# Patient Record
Sex: Female | Born: 1940 | Race: Black or African American | Hispanic: No | Smoking: Former smoker
Health system: Southern US, Community
[De-identification: ages and names within clinical notes are randomized; demographics above are authoritative.]

## PROBLEM LIST (undated history)

## (undated) DIAGNOSIS — Z950 Presence of cardiac pacemaker: Secondary | ICD-10-CM

## (undated) DIAGNOSIS — I739 Peripheral vascular disease, unspecified: Secondary | ICD-10-CM

## (undated) DIAGNOSIS — I4891 Unspecified atrial fibrillation: Secondary | ICD-10-CM

## (undated) DIAGNOSIS — I509 Heart failure, unspecified: Secondary | ICD-10-CM

## (undated) DIAGNOSIS — Z86718 Personal history of other venous thrombosis and embolism: Secondary | ICD-10-CM

## (undated) DIAGNOSIS — I10 Essential (primary) hypertension: Secondary | ICD-10-CM

## (undated) DIAGNOSIS — J449 Chronic obstructive pulmonary disease, unspecified: Secondary | ICD-10-CM

## (undated) DIAGNOSIS — E119 Type 2 diabetes mellitus without complications: Secondary | ICD-10-CM

## (undated) HISTORY — DX: Peripheral vascular disease, unspecified: I73.9

## (undated) HISTORY — DX: Presence of cardiac pacemaker: Z95.0

---

## 2001-09-05 ENCOUNTER — Ambulatory Visit (HOSPITAL_BASED_OUTPATIENT_CLINIC_OR_DEPARTMENT_OTHER): Admission: RE | Admit: 2001-09-05 | Discharge: 2001-09-05 | Payer: Self-pay | Admitting: Orthopedic Surgery

## 2001-10-31 ENCOUNTER — Ambulatory Visit (HOSPITAL_BASED_OUTPATIENT_CLINIC_OR_DEPARTMENT_OTHER): Admission: RE | Admit: 2001-10-31 | Discharge: 2001-10-31 | Payer: Self-pay | Admitting: Orthopedic Surgery

## 2002-01-02 ENCOUNTER — Ambulatory Visit (HOSPITAL_BASED_OUTPATIENT_CLINIC_OR_DEPARTMENT_OTHER): Admission: RE | Admit: 2002-01-02 | Discharge: 2002-01-02 | Payer: Self-pay | Admitting: Orthopedic Surgery

## 2013-04-05 ENCOUNTER — Emergency Department (HOSPITAL_BASED_OUTPATIENT_CLINIC_OR_DEPARTMENT_OTHER): Payer: Medicare Other

## 2013-04-05 ENCOUNTER — Emergency Department (HOSPITAL_BASED_OUTPATIENT_CLINIC_OR_DEPARTMENT_OTHER)
Admission: EM | Admit: 2013-04-05 | Discharge: 2013-04-05 | Disposition: A | Discharge status: Home / Self Care | Payer: Medicare Other | Attending: Emergency Medicine | Admitting: Emergency Medicine

## 2013-04-05 ENCOUNTER — Encounter (HOSPITAL_BASED_OUTPATIENT_CLINIC_OR_DEPARTMENT_OTHER): Payer: Self-pay | Admitting: Emergency Medicine

## 2013-04-05 DIAGNOSIS — I10 Essential (primary) hypertension: Secondary | ICD-10-CM | POA: Insufficient documentation

## 2013-04-05 DIAGNOSIS — Z7901 Long term (current) use of anticoagulants: Secondary | ICD-10-CM | POA: Insufficient documentation

## 2013-04-05 DIAGNOSIS — Z79899 Other long term (current) drug therapy: Secondary | ICD-10-CM | POA: Insufficient documentation

## 2013-04-05 DIAGNOSIS — R071 Chest pain on breathing: Secondary | ICD-10-CM | POA: Insufficient documentation

## 2013-04-05 DIAGNOSIS — Z7982 Long term (current) use of aspirin: Secondary | ICD-10-CM | POA: Insufficient documentation

## 2013-04-05 DIAGNOSIS — Z86718 Personal history of other venous thrombosis and embolism: Secondary | ICD-10-CM | POA: Insufficient documentation

## 2013-04-05 DIAGNOSIS — I4891 Unspecified atrial fibrillation: Secondary | ICD-10-CM | POA: Insufficient documentation

## 2013-04-05 DIAGNOSIS — Z87891 Personal history of nicotine dependence: Secondary | ICD-10-CM | POA: Insufficient documentation

## 2013-04-05 DIAGNOSIS — E119 Type 2 diabetes mellitus without complications: Secondary | ICD-10-CM | POA: Insufficient documentation

## 2013-04-05 DIAGNOSIS — J449 Chronic obstructive pulmonary disease, unspecified: Secondary | ICD-10-CM | POA: Insufficient documentation

## 2013-04-05 DIAGNOSIS — J4489 Other specified chronic obstructive pulmonary disease: Secondary | ICD-10-CM | POA: Insufficient documentation

## 2013-04-05 DIAGNOSIS — R0789 Other chest pain: Secondary | ICD-10-CM

## 2013-04-05 HISTORY — DX: Personal history of other venous thrombosis and embolism: Z86.718

## 2013-04-05 HISTORY — DX: Type 2 diabetes mellitus without complications: E11.9

## 2013-04-05 HISTORY — DX: Essential (primary) hypertension: I10

## 2013-04-05 HISTORY — DX: Chronic obstructive pulmonary disease, unspecified: J44.9

## 2013-04-05 LAB — URINE MICROSCOPIC-ADD ON

## 2013-04-05 LAB — URINALYSIS, ROUTINE W REFLEX MICROSCOPIC
Bilirubin Urine: NEGATIVE
Glucose, UA: >1000 mg/dL — AB
Ketones, ur: NEGATIVE mg/dL
Leukocytes, UA: NEGATIVE
pH: 5.5 (ref 5.0–8.0)

## 2013-04-05 LAB — PROTIME-INR
INR: 2.74 — ABNORMAL HIGH (ref 0.00–1.49)
Prothrombin Time: 28.1 seconds — ABNORMAL HIGH (ref 11.6–15.2)

## 2013-04-05 LAB — CBC WITH DIFFERENTIAL/PLATELET
Basophils Absolute: 0 10*3/uL (ref 0.0–0.1)
Basophils Relative: 0 % (ref 0–1)
Hemoglobin: 12.8 g/dL (ref 12.0–15.0)
MCHC: 33.1 g/dL (ref 30.0–36.0)
Monocytes Relative: 5 % (ref 3–12)
Neutro Abs: 7.9 10*3/uL — ABNORMAL HIGH (ref 1.7–7.7)
Neutrophils Relative %: 80 % — ABNORMAL HIGH (ref 43–77)
Platelets: 185 10*3/uL (ref 150–400)
RBC: 4.44 MIL/uL (ref 3.87–5.11)
RDW: 15.3 % (ref 11.5–15.5)

## 2013-04-05 LAB — TROPONIN I: Troponin I: <0.3 ng/mL (ref <0.30)

## 2013-04-05 LAB — BASIC METABOLIC PANEL
Chloride: 103 mEq/L (ref 96–112)
GFR calc Af Amer: 64 mL/min — ABNORMAL LOW (ref >90)
Potassium: 4 mEq/L (ref 3.5–5.1)
Sodium: 139 mEq/L (ref 135–145)

## 2013-04-05 MED ORDER — KETOROLAC TROMETHAMINE 30 MG/ML IJ SOLN
INTRAMUSCULAR | Status: AC
  Administered 2013-04-05: 30 mg
  Filled 2013-04-05: qty 1

## 2013-04-05 MED ORDER — POTASSIUM CHLORIDE CRYS ER 20 MEQ PO TBCR
EXTENDED_RELEASE_TABLET | ORAL | Status: AC
  Filled 2013-04-05: qty 2

## 2013-04-05 MED ORDER — TRAMADOL HCL 50 MG PO TABS: 50.0000 mg | ORAL_TABLET | Freq: Four times a day (QID) | ORAL | Status: DC | PRN

## 2013-04-05 MED ORDER — IOHEXOL 350 MG/ML SOLN
80.0000 mL | Freq: Once | INTRAVENOUS | Status: AC | PRN
  Administered 2013-04-05: 80 mL via INTRAVENOUS

## 2013-04-05 NOTE — ED Provider Notes (Signed)
CSN: [id number]     Arrival date & time 04/05/13  8295 History   First MD Initiated Contact with Patient 04/05/13 [phone]     Chief Complaint  Patient presents with  . Flank Pain   (Consider location/radiation/quality/duration/timing/severity/associated sxs/prior Treatment) Patient is a 72 y.o. female presenting with chest pain. The history is provided by the patient. No language interpreter was used.  Chest Pain Pain location:  R lateral chest Pain quality: sharp   Pain radiates to:  Does not radiate Pain severity:  Moderate Onset quality:  Sudden Duration:  12 hours Timing:  Constant Progression:  Unchanged Chronicity:  New Context: breathing   Relieved by:  Nothing Worsened by:  Nothing tried Ineffective treatments:  None tried Associated symptoms: no abdominal pain, no fever, no palpitations and no shortness of breath   Risk factors: prior DVT/PE   Risk factors: no aortic disease     Past Medical History  Diagnosis Date  . COPD (chronic obstructive pulmonary disease)   . Diabetes mellitus without complication   . Hypertension   . Hx of blood clots    History reviewed. No pertinent past surgical history. History reviewed. No pertinent family history. History  Substance Use Topics  . Smoking status: Former Games developer  . Smokeless tobacco: Not on file  . Alcohol Use: No   OB History   Grav Para Term Preterm Abortions TAB SAB Ect Mult Living                 Review of Systems  Constitutional: Negative for fever.  HENT: Negative for neck pain.   Respiratory: Negative for shortness of breath.   Cardiovascular: Positive for chest pain. Negative for palpitations and leg swelling.  Gastrointestinal: Negative for abdominal pain.  All other systems reviewed and are negative.    Allergies  Review of patient's allergies indicates no known allergies.  Home Medications   Current Outpatient Rx  Name  Route  Sig  Dispense  Refill  . albuterol (PROVENTIL) (2.5 MG/3ML)  0.083% nebulizer solution   Nebulization   Take 2.5 mg by nebulization every 6 (six) hours as needed for wheezing.         Marland Kitchen alendronate (FOSAMAX) 35 MG tablet   Oral   Take 35 mg by mouth every 7 (seven) days. Take with a full glass of water on an empty stomach.         Marland Kitchen aspirin 81 MG chewable tablet   Oral   Chew 81 mg by mouth daily.         Marland Kitchen glipizide-metformin (METAGLIP) 2.5-250 MG per tablet   Oral   Take 1 tablet by mouth 2 (two) times daily before a meal.         . metFORMIN (GLUCOPHAGE) 1000 MG tablet   Oral   Take 1,000 mg by mouth 2 (two) times daily with a meal.         . warfarin (COUMADIN) 6 MG tablet   Oral   Take 6 mg by mouth daily.          BP 149/110  Pulse 70  Temp(Src) 98.5 F (36.9 C) (Oral)  Resp 18  Ht 5\' 3"  (1.6 m)  Wt 193 lb (87.544 kg)  BMI 34.2 kg/m2  SpO2 98% Physical Exam  Constitutional: She is oriented to person, place, and time. She appears well-developed and well-nourished. No distress.  HENT:  Head: Normocephalic and atraumatic.  Mouth/Throat: Oropharynx is clear and moist.  Eyes: Conjunctivae are normal.  Pupils are equal, round, and reactive to light.  Neck: Normal range of motion. Neck supple.  Cardiovascular: Normal rate, regular rhythm and intact distal pulses.   Regular on exam  Pulmonary/Chest: Effort normal and breath sounds normal. She has no wheezes. She has no rales.  Abdominal: Soft. Bowel sounds are normal. There is no tenderness. There is no rebound and no guarding.  Musculoskeletal: Normal range of motion.  Neurological: She is alert and oriented to person, place, and time.  Skin: Skin is warm and dry.  Psychiatric: She has a normal mood and affect.    ED Course  Procedures (including critical care time) Labs Review Labs Reviewed  URINALYSIS, ROUTINE W REFLEX MICROSCOPIC - Abnormal; Notable for the following:    Glucose, UA >1000 (*)    Hgb urine dipstick SMALL (*)    All other components within  normal limits  URINE MICROSCOPIC-ADD ON - Abnormal; Notable for the following:    Squamous Epithelial / LPF FEW (*)    Bacteria, UA FEW (*)    All other components within normal limits  CBC WITH DIFFERENTIAL  BASIC METABOLIC PANEL  D-DIMER, QUANTITATIVE   Imaging Review No results found.  MDM  No diagnosis found. 04/05/2013    Date: 04/05/2013  Rate: 115  Rhythm: atrial fibrillation  QRS Axis: normal  Intervals: normal  ST/T Wave abnormalities: normal  Conduction Disutrbances:none  Narrative Interpretation:   Old EKG Reviewed: none available   Appropriately anti coagulated, In the setting of negative troponin and > 8 hours of pain ACS is excluded.  Will treat for chest wall strain.  Follow up with cardiology regarding PAF     Salinda Snedeker K Zilda No-Rasch, MD 04/05/13 [id number]

## 2013-04-05 NOTE — ED Notes (Signed)
Pt c/o pain on right and left flank that started last pm

## 2014-03-21 ENCOUNTER — Encounter (HOSPITAL_BASED_OUTPATIENT_CLINIC_OR_DEPARTMENT_OTHER): Payer: Self-pay | Admitting: Emergency Medicine

## 2014-03-21 ENCOUNTER — Emergency Department (HOSPITAL_BASED_OUTPATIENT_CLINIC_OR_DEPARTMENT_OTHER): Payer: Medicare Other

## 2014-03-21 ENCOUNTER — Emergency Department (HOSPITAL_BASED_OUTPATIENT_CLINIC_OR_DEPARTMENT_OTHER)
Admission: EM | Admit: 2014-03-21 | Discharge: 2014-03-21 | Disposition: A | Discharge status: Home / Self Care | Payer: Medicare Other | Attending: Emergency Medicine | Admitting: Emergency Medicine

## 2014-03-21 DIAGNOSIS — I10 Essential (primary) hypertension: Secondary | ICD-10-CM | POA: Diagnosis not present

## 2014-03-21 DIAGNOSIS — M7989 Other specified soft tissue disorders: Secondary | ICD-10-CM | POA: Insufficient documentation

## 2014-03-21 DIAGNOSIS — Z7982 Long term (current) use of aspirin: Secondary | ICD-10-CM | POA: Diagnosis not present

## 2014-03-21 DIAGNOSIS — Z7901 Long term (current) use of anticoagulants: Secondary | ICD-10-CM | POA: Diagnosis not present

## 2014-03-21 DIAGNOSIS — E119 Type 2 diabetes mellitus without complications: Secondary | ICD-10-CM | POA: Insufficient documentation

## 2014-03-21 DIAGNOSIS — Z86718 Personal history of other venous thrombosis and embolism: Secondary | ICD-10-CM | POA: Insufficient documentation

## 2014-03-21 DIAGNOSIS — Z79899 Other long term (current) drug therapy: Secondary | ICD-10-CM | POA: Insufficient documentation

## 2014-03-21 DIAGNOSIS — R079 Chest pain, unspecified: Secondary | ICD-10-CM | POA: Diagnosis not present

## 2014-03-21 DIAGNOSIS — I509 Heart failure, unspecified: Secondary | ICD-10-CM | POA: Insufficient documentation

## 2014-03-21 DIAGNOSIS — J441 Chronic obstructive pulmonary disease with (acute) exacerbation: Secondary | ICD-10-CM | POA: Diagnosis not present

## 2014-03-21 DIAGNOSIS — IMO0002 Reserved for concepts with insufficient information to code with codable children: Secondary | ICD-10-CM | POA: Diagnosis not present

## 2014-03-21 DIAGNOSIS — I499 Cardiac arrhythmia, unspecified: Secondary | ICD-10-CM | POA: Insufficient documentation

## 2014-03-21 DIAGNOSIS — Z87891 Personal history of nicotine dependence: Secondary | ICD-10-CM | POA: Diagnosis not present

## 2014-03-21 DIAGNOSIS — R0602 Shortness of breath: Secondary | ICD-10-CM

## 2014-03-21 HISTORY — DX: Heart failure, unspecified: I50.9

## 2014-03-21 LAB — CBC WITH DIFFERENTIAL/PLATELET
Basophils Absolute: 0 10*3/uL (ref 0.0–0.1)
Basophils Relative: 0 % (ref 0–1)
EOS ABS: 0 10*3/uL (ref 0.0–0.7)
EOS PCT: 0 % (ref 0–5)
HEMATOCRIT: 38.5 % (ref 36.0–46.0)
Hemoglobin: 12.5 g/dL (ref 12.0–15.0)
Lymphocytes Relative: 13 % (ref 12–46)
Lymphs Abs: 1.2 10*3/uL (ref 0.7–4.0)
MCH: 27.8 pg (ref 26.0–34.0)
MCHC: 32.5 g/dL (ref 30.0–36.0)
MCV: 85.6 fL (ref 78.0–100.0)
MONO ABS: 0.4 10*3/uL (ref 0.1–1.0)
Monocytes Relative: 5 % (ref 3–12)
Neutro Abs: 7.1 10*3/uL (ref 1.7–7.7)
Neutrophils Relative %: 82 % — ABNORMAL HIGH (ref 43–77)
Platelets: 210 10*3/uL (ref 150–400)
RBC: 4.5 MIL/uL (ref 3.87–5.11)
RDW: 17.6 % — ABNORMAL HIGH (ref 11.5–15.5)
WBC: 8.7 10*3/uL (ref 4.0–10.5)

## 2014-03-21 LAB — COMPREHENSIVE METABOLIC PANEL
ALBUMIN: 3.1 g/dL — AB (ref 3.5–5.2)
ALT: 15 U/L (ref 0–35)
AST: 14 U/L (ref 0–37)
Alkaline Phosphatase: 78 U/L (ref 39–117)
Anion gap: 14 (ref 5–15)
BILIRUBIN TOTAL: 0.3 mg/dL (ref 0.3–1.2)
BUN: 17 mg/dL (ref 6–23)
CHLORIDE: 101 meq/L (ref 96–112)
CO2: 25 meq/L (ref 19–32)
Calcium: 9.9 mg/dL (ref 8.4–10.5)
Creatinine, Ser: 1 mg/dL (ref 0.50–1.10)
GFR calc Af Amer: 64 mL/min — ABNORMAL LOW (ref >90)
GFR calc non Af Amer: 55 mL/min — ABNORMAL LOW (ref >90)
Glucose, Bld: 264 mg/dL — ABNORMAL HIGH (ref 70–99)
Potassium: 4.8 mEq/L (ref 3.7–5.3)
Sodium: 140 mEq/L (ref 137–147)
Total Protein: 7 g/dL (ref 6.0–8.3)

## 2014-03-21 LAB — TROPONIN I

## 2014-03-21 LAB — PRO B NATRIURETIC PEPTIDE: PRO B NATRI PEPTIDE: 538.9 pg/mL — AB (ref 0–125)

## 2014-03-21 LAB — PROTIME-INR
INR: 3.08 — AB (ref 0.00–1.49)
Prothrombin Time: 31.8 seconds — ABNORMAL HIGH (ref 11.6–15.2)

## 2014-03-21 MED ORDER — SODIUM CHLORIDE 0.9 % IV SOLN: INTRAVENOUS | Status: DC

## 2014-03-21 NOTE — ED Notes (Signed)
Pt c/o SHOB and BLE edema since last pm; taking meds as directed

## 2014-03-21 NOTE — Discharge Instructions (Signed)
Return for any newer worse symptoms. Make plans to followup with your regular Dr. in the next several days. Would recommend using your albuterol inhaler 2 puffs every 6 hours for the next few days. Would recommend taking your when necessary dose of Lasix daily for the next few days. Workup here without evidence of distinct congestive heart failure or evidence of a blood clot in the left leg. Symptoms could be related to COPD and/or combination of that with some mild CHF. However chest x-ray was negative for any pulmonary edema or fluid on the lungs.

## 2014-03-21 NOTE — ED Provider Notes (Signed)
CSN: [id number]     Arrival date & time 03/21/14  1443 History   First MD Initiated Contact with Patient 03/21/14 1507    This chart was scribed for Vanetta Mulders, MD by Tonye Royalty, ED Scribe. This patient was seen in room MH03/MH03 and the patient's care was started at 3:16 PM.     Chief Complaint  Patient presents with  . Shortness of Breath   Patient is a 73 y.o. female presenting with shortness of breath. The history is provided by the patient and a relative. No language interpreter was used.  Shortness of Breath Severity:  Mild Onset quality:  Sudden Duration: since last night. Timing:  Constant Chronicity:  Recurrent Relieved by:  Nothing Exacerbated by: lying down. Ineffective treatments:  None tried Associated symptoms: chest pain and cough   Associated symptoms: no abdominal pain, no fever, no headaches, no rash, no sore throat and no vomiting   Chest pain:    Duration: since last night.   Timing:  Intermittent (<15 minutes per episode)   HPI Comments: Jill Crane is a 73 y.o. female who presents to the Emergency Department complaining of shortness of breath with onset last night, worse with laying down. She reports prior shortness of breath due to congestive heart failure and states she takes Lasix as needed. She also reports history of COPD and atrial fibrillation. She reports associated intermittent chest pain lasting less than 15 minutes each time with onset last night. She also reports associated swelling in legs, distended abdomen, and cough.  She denies abdominal pain.   Past Medical History  Diagnosis Date  . COPD (chronic obstructive pulmonary disease)   . Diabetes mellitus without complication   . Hypertension   . Hx of blood clots   . CHF (congestive heart failure)    History reviewed. No pertinent past surgical history. No family history on file. History  Substance Use Topics  . Smoking status: Former Games developer  . Smokeless tobacco: Not on file  .  Alcohol Use: No   OB History   Grav Para Term Preterm Abortions TAB SAB Ect Mult Living                 Review of Systems  Constitutional: Negative for fever and chills.  HENT: Negative for rhinorrhea and sore throat.   Eyes: Negative for visual disturbance.  Respiratory: Positive for cough and shortness of breath.   Cardiovascular: Positive for chest pain and leg swelling (unilateral, left).  Gastrointestinal: Positive for abdominal distention. Negative for nausea, vomiting, abdominal pain and diarrhea.  Genitourinary: Negative for dysuria.  Musculoskeletal: Negative for back pain.  Skin: Negative for rash.  Neurological: Negative for headaches.  Hematological: Bruises/bleeds easily.  Psychiatric/Behavioral: Negative for confusion.      Allergies  Review of patient's allergies indicates no known allergies.  Home Medications   Prior to Admission medications   Medication Sig Start Date End Date Taking? Authorizing Provider  albuterol (PROVENTIL) (2.5 MG/3ML) 0.083% nebulizer solution Take 2.5 mg by nebulization every 6 (six) hours as needed for wheezing.   Yes Historical Provider, MD  alendronate (FOSAMAX) 35 MG tablet Take 35 mg by mouth every 7 (seven) days. Take with a full glass of water on an empty stomach.   Yes Historical Provider, MD  aspirin 81 MG chewable tablet Chew 81 mg by mouth daily.   Yes Historical Provider, MD  calcium-vitamin D (OSCAL WITH D) 500-200 MG-UNIT per tablet Take 1 tablet by mouth.   Yes Historical  Provider, MD  FUROSEMIDE PO Take by mouth as needed.   Yes Historical Provider, MD  glipiZIDE (GLUCOTROL XL) 5 MG 24 hr tablet Take 5 mg by mouth daily with breakfast.   Yes Historical Provider, MD  LISINOPRIL PO Take 2.5 mg by mouth daily.   Yes Historical Provider, MD  metFORMIN (GLUCOPHAGE) 500 MG tablet Take by mouth 2 (two) times daily with a meal.   Yes Historical Provider, MD  metoprolol tartrate (LOPRESSOR) 25 MG tablet Take 25 mg by mouth 2 (two)  times daily.   Yes Historical Provider, MD  omeprazole (PRILOSEC) 20 MG capsule Take 20 mg by mouth daily.   Yes Historical Provider, MD  predniSONE (DELTASONE) 5 MG tablet Take 5 mg by mouth daily with breakfast.   Yes Historical Provider, MD  glipizide-metformin (METAGLIP) 2.5-250 MG per tablet Take 1 tablet by mouth 2 (two) times daily before a meal.    Historical Provider, MD  metFORMIN (GLUCOPHAGE) 1000 MG tablet Take 1,000 mg by mouth 2 (two) times daily with a meal.    Historical Provider, MD  traMADol (ULTRAM) 50 MG tablet Take 1 tablet (50 mg total) by mouth every 6 (six) hours as needed for pain. 04/05/13   April K Palumbo-Rasch, MD  warfarin (COUMADIN) 6 MG tablet Take 6 mg by mouth as directed.     Historical Provider, MD   BP 155/99  Pulse 52  Temp(Src) 97.5 F (36.4 C) (Oral)  Resp 20  Ht  (1.6 m)  Wt 185 lb (83.915 kg)  BMI 32.78 kg/m2  SpO2 97% Physical Exam  Nursing note and vitals reviewed. Constitutional: She is oriented to person, place, and time. She appears well-developed and well-nourished.  HENT:  Head: Normocephalic and atraumatic.  Moist mucous membranes  Eyes: Conjunctivae are normal. No scleral icterus.  Neck: Normal range of motion. Neck supple.  Cardiovascular:  irregular  Pulmonary/Chest: Effort normal and breath sounds normal. No respiratory distress. She has no wheezes. She has no rales.  Abdominal: Bowel sounds are normal. She exhibits distension. There is no tenderness.  Musculoskeletal: Normal range of motion. She exhibits edema (pitting, left leg only).  Dorsalis pedis pulse in both feet 1+, vein varicosity greater on left  Neurological: She is alert and oriented to person, place, and time. No cranial nerve deficit. She exhibits normal muscle tone. Coordination normal.  Skin: Skin is warm and dry.  Psychiatric: She has a normal mood and affect.    ED Course  Procedures (including critical care time) Labs Review Labs Reviewed   PROTIME-INR - Abnormal; Notable for the following:    Prothrombin Time 31.8 (*)    INR 3.08 (*)    All other components within normal limits  PRO B NATRIURETIC PEPTIDE - Abnormal; Notable for the following:    Pro B Natriuretic peptide (BNP) 538.9 (*)    All other components within normal limits  COMPREHENSIVE METABOLIC PANEL - Abnormal; Notable for the following:    Glucose, Bld 264 (*)    Albumin 3.1 (*)    GFR calc non Af Amer 55 (*)    GFR calc Af Amer 64 (*)    All other components within normal limits  CBC WITH DIFFERENTIAL - Abnormal; Notable for the following:    RDW 17.6 (*)    Neutrophils Relative % 82 (*)    All other components within normal limits  TROPONIN I  URINALYSIS, ROUTINE W REFLEX MICROSCOPIC    Imaging Review Dg Chest 2 View  03/21/2014   CLINICAL DATA:  Shortness of breath and left-sided chest pain since last night  EXAM: CHEST  2 VIEW  COMPARISON:  03/26/2013  FINDINGS: Moderate enlargement of the cardiac silhouette is noted without overt edema. The thoracic spine is poorly visualized due to subjective osteopenia, with mild mid thoracic kyphosis. No new focal pulmonary opacity. Patient is positioned in AP lordotic positioning. No pleural effusion.  IMPRESSION: Cardiomegaly without focal acute finding.   Electronically Signed   By: Christiana Pellant M.D.   On: 03/21/2014 16:07   US Venous Img Lower Unilateral Left  03/21/2014   CLINICAL DATA:  Left lower extremity swelling. The patient reports a previous left lower extremity deep venous thrombosis but prior imaging is not available at this institution.  EXAM: Left LOWER EXTREMITY VENOUS DOPPLER ULTRASOUND  TECHNIQUE: Gray-scale sonography with graded compression, as well as color Doppler and duplex ultrasound were performed to evaluate the lower extremity deep venous systems from the level of the common femoral vein and including the common femoral, femoral, profunda femoral, popliteal and calf veins including the  posterior tibial, peroneal and gastrocnemius veins when visible. The superficial great saphenous vein was also interrogated. Spectral Doppler was utilized to evaluate flow at rest and with distal augmentation maneuvers in the common femoral, femoral and popliteal veins.  COMPARISON:  None.  FINDINGS: Common Femoral Vein: No evidence of thrombus. Normal compressibility, respiratory phasicity and response to augmentation.  Saphenofemoral Junction: No evidence of thrombus. Normal compressibility and flow on color Doppler imaging. Linear peripheral echoes within the proximal superficial femoral vein have a configuration typical for remote thrombus and/or adjacent valve.  Profunda Femoral Vein: No evidence of thrombus. Normal compressibility and flow on color Doppler imaging.  Femoral Vein: No evidence of thrombus. Normal compressibility, respiratory phasicity and response to augmentation.  Popliteal Vein: No evidence of thrombus. Normal compressibility, respiratory phasicity and response to augmentation.  Calf Veins: No evidence of thrombus. Normal compressibility and flow on color Doppler imaging.  Superficial Great Saphenous Vein: No evidence of thrombus. Normal compressibility and flow on color Doppler imaging.  Venous Reflux:  None.  Other Findings:  None.  IMPRESSION: No evidence of acute deep venous thrombosis. Linear peripheral echoes within the proximal superficial femoral vein have a configuration typical for remote thrombus and/or adjacent valve.   Electronically Signed   By: Christiana Pellant M.D.   On: 03/21/2014 16:46     EKG Interpretation   Date/Time:  Saturday March 21 2014 15:17:55 EDT Ventricular Rate:  84 PR Interval:    QRS Duration: 90 QT Interval:  358 QTC Calculation: 423 R Axis:   -5 Text Interpretation:  atrial flutter with variable block Minimal voltage  criteria for LVH, may be normal variant Nonspecific T wave abnormality  Abnormal ECG No significant change since last tracing  Confirmed by  Bernece Gall  MD, Yacqub Baston [phone]) on 03/21/2014 3:38:17 PM     DIAGNOSTIC STUDIES: Oxygen Saturation is 97% on room air, normal by my interpretation.    COORDINATION OF CARE:    MDM   Final diagnoses:  Shortness of breath  Leg swelling   Patient with a history of COPD and CHF. No evidence of exacerbation of COPD here today with wheezing or any hypoxia. Patient very comfortable. Patient however may have some mild bronchospasm going on. In addition patient has a history of congestive heart failure. Chest x-ray negative for any significant pulmonary edema. Patient had the leg swelling predominantly left minimal swelling on the right Doppler  studies of the left leg showed no evidence of a deep vein thrombosis. Patient is on a when necessary dose of Lasix at home and also has albuterol inhaler. We'll have patient start using them on a regular basis for the next few days. And followup with her record Dr.  In addition no evidence of an acute cardiac event. Troponin was negative EKG without any acute changes. Patient has a long-standing history of atrial for relation she is on Coumadin for that INR is close to therapeutic at a level above 3. No reason at this time for adjustment. Also clinically do not feel that patient has any clinical evidence of a pulmonary embolus.    I personally performed the services described in this documentation, which was scribed in my presence. The recorded information has been reviewed and is accurate.      Vanetta Mulders, MD 03/21/14 1739

## 2014-03-21 NOTE — ED Notes (Signed)
Pt discharged to home with family. NAD.  

## 2014-08-16 ENCOUNTER — Encounter (HOSPITAL_BASED_OUTPATIENT_CLINIC_OR_DEPARTMENT_OTHER): Payer: Self-pay | Admitting: *Deleted

## 2014-08-16 ENCOUNTER — Emergency Department (HOSPITAL_BASED_OUTPATIENT_CLINIC_OR_DEPARTMENT_OTHER)
Admission: EM | Admit: 2014-08-16 | Discharge: 2014-08-16 | Disposition: A | Discharge status: Home / Self Care | Payer: Medicare Other | Attending: Emergency Medicine | Admitting: Emergency Medicine

## 2014-08-16 ENCOUNTER — Emergency Department (HOSPITAL_BASED_OUTPATIENT_CLINIC_OR_DEPARTMENT_OTHER): Payer: Medicare Other

## 2014-08-16 DIAGNOSIS — I4891 Unspecified atrial fibrillation: Secondary | ICD-10-CM | POA: Diagnosis not present

## 2014-08-16 DIAGNOSIS — Z87891 Personal history of nicotine dependence: Secondary | ICD-10-CM | POA: Insufficient documentation

## 2014-08-16 DIAGNOSIS — J441 Chronic obstructive pulmonary disease with (acute) exacerbation: Secondary | ICD-10-CM | POA: Insufficient documentation

## 2014-08-16 DIAGNOSIS — J029 Acute pharyngitis, unspecified: Secondary | ICD-10-CM | POA: Insufficient documentation

## 2014-08-16 DIAGNOSIS — E119 Type 2 diabetes mellitus without complications: Secondary | ICD-10-CM | POA: Diagnosis not present

## 2014-08-16 DIAGNOSIS — I10 Essential (primary) hypertension: Secondary | ICD-10-CM | POA: Insufficient documentation

## 2014-08-16 DIAGNOSIS — Z7982 Long term (current) use of aspirin: Secondary | ICD-10-CM | POA: Insufficient documentation

## 2014-08-16 DIAGNOSIS — Z79899 Other long term (current) drug therapy: Secondary | ICD-10-CM | POA: Insufficient documentation

## 2014-08-16 DIAGNOSIS — Z7952 Long term (current) use of systemic steroids: Secondary | ICD-10-CM | POA: Insufficient documentation

## 2014-08-16 DIAGNOSIS — R0602 Shortness of breath: Secondary | ICD-10-CM

## 2014-08-16 DIAGNOSIS — I509 Heart failure, unspecified: Secondary | ICD-10-CM | POA: Diagnosis not present

## 2014-08-16 LAB — COMPREHENSIVE METABOLIC PANEL
ALT: 10 U/L (ref 0–35)
AST: 19 U/L (ref 0–37)
Albumin: 3.6 g/dL (ref 3.5–5.2)
Alkaline Phosphatase: 82 U/L (ref 39–117)
Anion gap: 4 — ABNORMAL LOW (ref 5–15)
BUN: 13 mg/dL (ref 6–23)
CHLORIDE: 109 mmol/L (ref 96–112)
CO2: 21 mmol/L (ref 19–32)
Calcium: 8.8 mg/dL (ref 8.4–10.5)
Creatinine, Ser: 1.19 mg/dL — ABNORMAL HIGH (ref 0.50–1.10)
GFR calc Af Amer: 52 mL/min — ABNORMAL LOW (ref >90)
GFR, EST NON AFRICAN AMERICAN: 44 mL/min — AB (ref >90)
Glucose, Bld: 147 mg/dL — ABNORMAL HIGH (ref 70–99)
Potassium: 3.5 mmol/L (ref 3.5–5.1)
SODIUM: 134 mmol/L — AB (ref 135–145)
Total Bilirubin: 0.9 mg/dL (ref 0.3–1.2)
Total Protein: 7.1 g/dL (ref 6.0–8.3)

## 2014-08-16 LAB — CBC WITH DIFFERENTIAL/PLATELET
BASOS PCT: 1 % (ref 0–1)
Basophils Absolute: 0 10*3/uL (ref 0.0–0.1)
Eosinophils Absolute: 0 10*3/uL (ref 0.0–0.7)
Eosinophils Relative: 1 % (ref 0–5)
HCT: 41.3 % (ref 36.0–46.0)
HEMOGLOBIN: 13.5 g/dL (ref 12.0–15.0)
Lymphocytes Relative: 45 % (ref 12–46)
Lymphs Abs: 2.7 10*3/uL (ref 0.7–4.0)
MCH: 27.2 pg (ref 26.0–34.0)
MCHC: 32.7 g/dL (ref 30.0–36.0)
MCV: 83.3 fL (ref 78.0–100.0)
MONOS PCT: 14 % — AB (ref 3–12)
Monocytes Absolute: 0.8 10*3/uL (ref 0.1–1.0)
NEUTROS PCT: 39 % — AB (ref 43–77)
Neutro Abs: 2.3 10*3/uL (ref 1.7–7.7)
PLATELETS: 219 10*3/uL (ref 150–400)
RBC: 4.96 MIL/uL (ref 3.87–5.11)
RDW: 15.1 % (ref 11.5–15.5)
WBC: 5.8 10*3/uL (ref 4.0–10.5)

## 2014-08-16 LAB — URINALYSIS, ROUTINE W REFLEX MICROSCOPIC
BILIRUBIN URINE: NEGATIVE
Glucose, UA: NEGATIVE mg/dL
Hgb urine dipstick: NEGATIVE
KETONES UR: NEGATIVE mg/dL
NITRITE: NEGATIVE
Protein, ur: NEGATIVE mg/dL
SPECIFIC GRAVITY, URINE: 1.015 (ref 1.005–1.030)
UROBILINOGEN UA: 1 mg/dL (ref 0.0–1.0)
pH: 5.5 (ref 5.0–8.0)

## 2014-08-16 LAB — URINE MICROSCOPIC-ADD ON

## 2014-08-16 LAB — PROTIME-INR
INR: 2.04 — ABNORMAL HIGH (ref 0.00–1.49)
PROTHROMBIN TIME: 23 s — AB (ref 11.6–15.2)

## 2014-08-16 LAB — I-STAT CG4 LACTIC ACID, ED: Lactic Acid, Venous: 1.66 mmol/L (ref 0.5–2.0)

## 2014-08-16 LAB — TROPONIN I

## 2014-08-16 LAB — BRAIN NATRIURETIC PEPTIDE: B Natriuretic Peptide: 273.7 pg/mL — ABNORMAL HIGH (ref 0.0–100.0)

## 2014-08-16 MED ORDER — DILTIAZEM HCL 25 MG/5ML IV SOLN: 20.0000 mg | Freq: Once | INTRAVENOUS | Status: DC

## 2014-08-16 MED ORDER — BENZOCAINE (TOPICAL) 20 % EX AERO: 1.0000 "application " | INHALATION_SPRAY | Freq: Four times a day (QID) | CUTANEOUS | Status: DC | PRN

## 2014-08-16 MED ORDER — BENZOCAINE 20 % MT SOLN
Freq: Once | OROMUCOSAL | Status: AC
  Administered 2014-08-16: 2 via OROMUCOSAL

## 2014-08-16 NOTE — ED Notes (Signed)
Unable to give urine sample after 2nd attempt.

## 2014-08-16 NOTE — ED Provider Notes (Signed)
CSN: [id number]     Arrival date & time 08/16/14  0116 History   First MD Initiated Contact with Patient 08/16/14 0149     Chief Complaint  Patient presents with  . Sore Throat     (Consider location/radiation/quality/duration/timing/severity/associated sxs/prior Treatment) HPI This is a 74 year old female with a history of atrial fibrillation and congestive heart failure. She is here with a one-week history of sore throat. Pain is worse with swallowing. Pain is gotten worse despite treatment with an unspecified antibiotic and omeprazole for the past several days. She is also having "white foam" in her throat which she is coughing up. She complains of general weakness and achiness. She denies fever, chills, chest pain or nasal congestion. She does have shortness of breath but this is chronic and she is equivocal about whether this is acutely changed. She had 2 episodes of vomiting yesterday as well as diarrhea. She denies lightheadedness on standing.  Past Medical History  Diagnosis Date  . COPD (chronic obstructive pulmonary disease)   . Diabetes mellitus without complication   . Hypertension   . Hx of blood clots   . CHF (congestive heart failure)    History reviewed. No pertinent past surgical history. No family history on file. History  Substance Use Topics  . Smoking status: Former Games developermoker  . Smokeless tobacco: Not on file  . Alcohol Use: No   OB History    No data available     Review of Systems  All other systems reviewed and are negative.   Allergies  Review of patient's allergies indicates no known allergies.  Home Medications   Prior to Admission medications   Medication Sig Start Date End Date Taking? Authorizing Provider  albuterol (PROVENTIL HFA;VENTOLIN HFA) 108 (90 BASE) MCG/ACT inhaler Inhale 2 puffs into the lungs every 6 (six) hours as needed for wheezing or shortness of breath.   Yes Historical Provider, MD  busPIRone (BUSPAR) 5 MG tablet Take 5 mg by  mouth 3 (three) times daily.   Yes Historical Provider, MD  DILTIAZEM HCL PO Take 240 mg by mouth. Pt is not sure if she is still on this med. Does not have an updated med list   Yes Historical Provider, MD  ipratropium-albuterol (DUONEB) 0.5-2.5 (3) MG/3ML SOLN Take 3 mLs by nebulization.   Yes Historical Provider, MD  alendronate (FOSAMAX) 35 MG tablet Take 35 mg by mouth every 7 (seven) days. Take with a full glass of water on an empty stomach.    Historical Provider, MD  aspirin 81 MG chewable tablet Chew 81 mg by mouth daily.    Historical Provider, MD  calcium-vitamin D (OSCAL WITH D) 500-200 MG-UNIT per tablet Take 1 tablet by mouth.    Historical Provider, MD  FUROSEMIDE PO Take by mouth as needed.    Historical Provider, MD  glipiZIDE (GLUCOTROL XL) 5 MG 24 hr tablet Take 5 mg by mouth daily with breakfast. Pt does not have an updated med list. Not sure if she is still taking this.    Historical Provider, MD  LISINOPRIL PO Take 2.5 mg by mouth daily.    Historical Provider, MD  metFORMIN (GLUCOPHAGE) 500 MG tablet Take by mouth 2 (two) times daily with a meal.    Historical Provider, MD  metoprolol tartrate (LOPRESSOR) 25 MG tablet Take 25 mg by mouth 2 (two) times daily.    Historical Provider, MD  omeprazole (PRILOSEC) 20 MG capsule Take 20 mg by mouth daily.    Historical  Provider, MD  predniSONE (DELTASONE) 5 MG tablet Take 5 mg by mouth daily with breakfast.    Historical Provider, MD  traMADol (ULTRAM) 50 MG tablet Take 1 tablet (50 mg total) by mouth every 6 (six) hours as needed for pain. 04/05/13   April K Palumbo-Rasch, MD  warfarin (COUMADIN) 6 MG tablet Take 6 mg by mouth as directed.     Historical Provider, MD   BP 130/74 mmHg  Pulse 77  Temp(Src) 97.9 F (36.6 C) (Oral)  Resp 24  Ht  (1.6 m)  Wt 180 lb (81.647 kg)  BMI 31.89 kg/m2  SpO2 92%   Physical Exam  General: Well-developed, well-nourished female in no acute distress; appearance consistent with age of  record HENT: normocephalic; atraumatic; pharynx appears normal except for presence of white foam and throat Eyes: pupils equal, round and reactive to light; extraocular muscles intact Neck: supple Heart: Irregular rhythm; tachycardia Lungs: clear to auscultation bilaterally Abdomen: soft; nondistended; nontender; no masses or hepatosplenomegaly; bowel sounds present Extremities: No deformity; full range of motion; edema of lower legs, left greater than right Neurologic: Awake, alert and oriented; motor function intact in all extremities and symmetric; no facial droop Skin: Warm and dry Psychiatric: Flat affect    ED Course  Procedures (including critical care time)   MDM  EKG Interpretation:  Date & Time: 08/16/2014 1:34 AM  Rate: 103  Rhythm: atrial flutter with variable block  QRS Axis: normal  Intervals: normal  ST/T Wave abnormalities: nonspecific ST/T changes  Conduction Disutrbances:none  Narrative Interpretation: LVH  Old EKG Reviewed: No significant changes   Nursing notes and vitals signs, including pulse oximetry, reviewed.  Summary of this visit's results, reviewed by myself:  Labs:  Results for orders placed or performed during the hospital encounter of 08/16/14 (from the past 24 hour(s))  Comprehensive metabolic panel     Status: Abnormal   Collection Time: 08/16/14  1:45 AM  Result Value Ref Range   Sodium 134 (L) 135 - 145 mmol/L   Potassium 3.5 3.5 - 5.1 mmol/L   Chloride 109 96 - 112 mmol/L   CO2 21 19 - 32 mmol/L   Glucose, Bld 147 (H) 70 - 99 mg/dL   BUN 13 6 - 23 mg/dL   Creatinine, Ser 1.61 (H) 0.50 - 1.10 mg/dL   Calcium 8.8 8.4 - 09.6 mg/dL   Total Protein 7.1 6.0 - 8.3 g/dL   Albumin 3.6 3.5 - 5.2 g/dL   AST 19 0 - 37 U/L   ALT 10 0 - 35 U/L   Alkaline Phosphatase 82 39 - 117 U/L   Total Bilirubin 0.9 0.3 - 1.2 mg/dL   GFR calc non Af Amer 44 (L) >90 mL/min   GFR calc Af Amer 52 (L) >90 mL/min   Anion gap 4 (L) 5 - 15  CBC with  Differential     Status: Abnormal   Collection Time: 08/16/14  1:45 AM  Result Value Ref Range   WBC 5.8 4.0 - 10.5 K/uL   RBC 4.96 3.87 - 5.11 MIL/uL   Hemoglobin 13.5 12.0 - 15.0 g/dL   HCT 04.5 40.9 - 81.1 %   MCV 83.3 78.0 - 100.0 fL   MCH 27.2 26.0 - 34.0 pg   MCHC 32.7 30.0 - 36.0 g/dL   RDW 91.4 78.2 - 95.6 %   Platelets 219 150 - 400 K/uL   Neutrophils Relative % 39 (L) 43 - 77 %   Neutro Abs 2.3  1.7 - 7.7 K/uL   Lymphocytes Relative 45 12 - 46 %   Lymphs Abs 2.7 0.7 - 4.0 K/uL   Monocytes Relative 14 (H) 3 - 12 %   Monocytes Absolute 0.8 0.1 - 1.0 K/uL   Eosinophils Relative 1 0 - 5 %   Eosinophils Absolute 0.0 0.0 - 0.7 K/uL   Basophils Relative 1 0 - 1 %   Basophils Absolute 0.0 0.0 - 0.1 K/uL  Protime-INR     Status: Abnormal   Collection Time: 08/16/14  1:45 AM  Result Value Ref Range   Prothrombin Time 23.0 (H) 11.6 - 15.2 seconds   INR 2.04 (H) 0.00 - 1.49  Troponin I     Status: None   Collection Time: 08/16/14  1:45 AM  Result Value Ref Range   Troponin I <0.03 <0.031 ng/mL  Brain natriuretic peptide     Status: Abnormal   Collection Time: 08/16/14  1:47 AM  Result Value Ref Range   B Natriuretic Peptide 273.7 (H) 0.0 - 100.0 pg/mL  I-Stat CG4 Lactic Acid, ED     Status: None   Collection Time: 08/16/14  1:57 AM  Result Value Ref Range   Lactic Acid, Venous 1.66 0.5 - 2.0 mmol/L  Urinalysis, Routine w reflex microscopic     Status: Abnormal   Collection Time: 08/16/14  4:23 AM  Result Value Ref Range   Color, Urine YELLOW YELLOW   APPearance CLEAR CLEAR   Specific Gravity, Urine 1.015 1.005 - 1.030   pH 5.5 5.0 - 8.0   Glucose, UA NEGATIVE NEGATIVE mg/dL   Hgb urine dipstick NEGATIVE NEGATIVE   Bilirubin Urine NEGATIVE NEGATIVE   Ketones, ur NEGATIVE NEGATIVE mg/dL   Protein, ur NEGATIVE NEGATIVE mg/dL   Urobilinogen, UA 1.0 0.0 - 1.0 mg/dL   Nitrite NEGATIVE NEGATIVE   Leukocytes, UA SMALL (A) NEGATIVE  Urine microscopic-add on     Status: None    Collection Time: 08/16/14  4:23 AM  Result Value Ref Range   Squamous Epithelial / LPF RARE RARE   WBC, UA 0-2 <3 WBC/hpf   RBC / HPF 0-2 <3 RBC/hpf   Bacteria, UA RARE RARE   Urine-Other MUCOUS PRESENT     Imaging Studies: Dg Chest 2 View  08/16/2014   CLINICAL DATA:  Blood tinged sputum for the past week. Treated with antibiotics for a sore throat. Shortness of breath at times.  EXAM: CHEST  2 VIEW  COMPARISON:  03/21/2014  FINDINGS: Mild cardiac enlargement. No pulmonary vascular congestion or edema. No focal airspace disease or consolidation in the lungs. No blunting of costophrenic angles. No pneumothorax. Calcified and tortuous aorta. 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 Inferior vena caval filter.  IMPRESSION: Cardiac enlargement without vascular congestion or edema. No evidence of active pulmonary disease.   Electronically Signed   By: Burman Nieves M.D.   On: 08/16/2014 02:11   4:54 AM Patient has remained stable in the ED. Her diagnostic studies are reassuring. She does not appear to be in overt pulmonary edema does not appear dyspneic. Her oxygen saturation is normal. Her heart rate is primarily been in the 80s and 90s. She was advised to continue the omeprazole and antibiotic. We will treat symptomatically.   Hanley Seamen, MD 08/16/14 [phone]

## 2014-08-16 NOTE — ED Notes (Addendum)
Pt alert, NAD, calm, interactive, skin W&D, resps e/u, mild increased wob noted, pt with scattered inconsistent responses[facility] no edema noted. VSS. LS diminished and clear.

## 2014-08-16 NOTE — ED Notes (Addendum)
I met patient in waiting room where patient stated she was here for pain all over and sinus drainage, with sore throat. Staff notified me that patient was CHF patient. I urgently took patient by wheelchair to room, had her place gown on, then took vitals. I then took ECG and handed print out to Dr.Molpus. I then documented vitals and placed patient on monitor.

## 2014-08-16 NOTE — ED Notes (Signed)
No changes, given PO water to provoke urine sample.

## 2014-08-16 NOTE — ED Notes (Signed)
Back from xray, pt up to b/r, steady gait, alert, NAD, calm, resps e/u, mild increased wob with ambulation, daughter present, unable to give urine sample at this time, returned to monitor, VSS.

## 2014-08-16 NOTE — ED Notes (Signed)
Pt up to b/r with family. No changes. Steady gait.

## 2014-08-16 NOTE — ED Notes (Signed)
Pt states she has been treated with antibiotic for a sorethroat. Pt also c/o white sputum for past week that has been blood tinged. Denies any cp. States she has been feeling a little sob at times. Atrial fib on monitor 97 States she has a hx of the same. No distress at present.

## 2015-09-11 ENCOUNTER — Encounter (HOSPITAL_BASED_OUTPATIENT_CLINIC_OR_DEPARTMENT_OTHER): Payer: Self-pay

## 2015-09-11 ENCOUNTER — Emergency Department (HOSPITAL_BASED_OUTPATIENT_CLINIC_OR_DEPARTMENT_OTHER)
Admission: EM | Admit: 2015-09-11 | Discharge: 2015-09-11 | Disposition: A | Discharge status: Home / Self Care | Payer: Medicare Other | Attending: Emergency Medicine | Admitting: Emergency Medicine

## 2015-09-11 DIAGNOSIS — R600 Localized edema: Secondary | ICD-10-CM | POA: Insufficient documentation

## 2015-09-11 DIAGNOSIS — Y9289 Other specified places as the place of occurrence of the external cause: Secondary | ICD-10-CM | POA: Insufficient documentation

## 2015-09-11 DIAGNOSIS — Z87891 Personal history of nicotine dependence: Secondary | ICD-10-CM | POA: Diagnosis not present

## 2015-09-11 DIAGNOSIS — Z7982 Long term (current) use of aspirin: Secondary | ICD-10-CM | POA: Diagnosis not present

## 2015-09-11 DIAGNOSIS — Y998 Other external cause status: Secondary | ICD-10-CM | POA: Diagnosis not present

## 2015-09-11 DIAGNOSIS — Z79899 Other long term (current) drug therapy: Secondary | ICD-10-CM | POA: Insufficient documentation

## 2015-09-11 DIAGNOSIS — X58XXXA Exposure to other specified factors, initial encounter: Secondary | ICD-10-CM | POA: Insufficient documentation

## 2015-09-11 DIAGNOSIS — M6281 Muscle weakness (generalized): Secondary | ICD-10-CM | POA: Insufficient documentation

## 2015-09-11 DIAGNOSIS — Z86718 Personal history of other venous thrombosis and embolism: Secondary | ICD-10-CM | POA: Insufficient documentation

## 2015-09-11 DIAGNOSIS — Z7901 Long term (current) use of anticoagulants: Secondary | ICD-10-CM | POA: Diagnosis not present

## 2015-09-11 DIAGNOSIS — E119 Type 2 diabetes mellitus without complications: Secondary | ICD-10-CM | POA: Insufficient documentation

## 2015-09-11 DIAGNOSIS — Z7984 Long term (current) use of oral hypoglycemic drugs: Secondary | ICD-10-CM | POA: Diagnosis not present

## 2015-09-11 DIAGNOSIS — I509 Heart failure, unspecified: Secondary | ICD-10-CM | POA: Diagnosis not present

## 2015-09-11 DIAGNOSIS — I10 Essential (primary) hypertension: Secondary | ICD-10-CM | POA: Insufficient documentation

## 2015-09-11 DIAGNOSIS — Z7952 Long term (current) use of systemic steroids: Secondary | ICD-10-CM | POA: Diagnosis not present

## 2015-09-11 DIAGNOSIS — S00512A Abrasion of oral cavity, initial encounter: Secondary | ICD-10-CM | POA: Insufficient documentation

## 2015-09-11 DIAGNOSIS — R791 Abnormal coagulation profile: Secondary | ICD-10-CM

## 2015-09-11 DIAGNOSIS — R58 Hemorrhage, not elsewhere classified: Secondary | ICD-10-CM | POA: Diagnosis present

## 2015-09-11 DIAGNOSIS — Y9389 Activity, other specified: Secondary | ICD-10-CM | POA: Diagnosis not present

## 2015-09-11 DIAGNOSIS — J449 Chronic obstructive pulmonary disease, unspecified: Secondary | ICD-10-CM | POA: Diagnosis not present

## 2015-09-11 LAB — CBC WITH DIFFERENTIAL/PLATELET
Basophils Absolute: 0 10*3/uL (ref 0.0–0.1)
Basophils Relative: 0 %
EOS PCT: 0 %
Eosinophils Absolute: 0 10*3/uL (ref 0.0–0.7)
HEMATOCRIT: 41.2 % (ref 36.0–46.0)
Hemoglobin: 13.9 g/dL (ref 12.0–15.0)
LYMPHS ABS: 1.2 10*3/uL (ref 0.7–4.0)
LYMPHS PCT: 13 %
MCH: 28.7 pg (ref 26.0–34.0)
MCHC: 33.7 g/dL (ref 30.0–36.0)
MCV: 84.9 fL (ref 78.0–100.0)
Monocytes Absolute: 0.3 10*3/uL (ref 0.1–1.0)
Monocytes Relative: 4 %
NEUTROS ABS: 7.7 10*3/uL (ref 1.7–7.7)
Neutrophils Relative %: 83 %
PLATELETS: 173 10*3/uL (ref 150–400)
RBC: 4.85 MIL/uL (ref 3.87–5.11)
RDW: 15.3 % (ref 11.5–15.5)
WBC: 9.2 10*3/uL (ref 4.0–10.5)

## 2015-09-11 LAB — PROTIME-INR
INR: 6.38 — AB (ref 0.00–1.49)
PROTHROMBIN TIME: 54 s — AB (ref 11.6–15.2)

## 2015-09-11 LAB — BASIC METABOLIC PANEL
ANION GAP: 10 (ref 5–15)
BUN: 14 mg/dL (ref 6–20)
CHLORIDE: 104 mmol/L (ref 101–111)
CO2: 22 mmol/L (ref 22–32)
Calcium: 8.2 mg/dL — ABNORMAL LOW (ref 8.9–10.3)
Creatinine, Ser: 1.19 mg/dL — ABNORMAL HIGH (ref 0.44–1.00)
GFR calc Af Amer: 51 mL/min — ABNORMAL LOW (ref >60)
GFR calc non Af Amer: 44 mL/min — ABNORMAL LOW (ref >60)
GLUCOSE: 342 mg/dL — AB (ref 65–99)
POTASSIUM: 3.7 mmol/L (ref 3.5–5.1)
SODIUM: 136 mmol/L (ref 135–145)

## 2015-09-11 NOTE — ED Notes (Signed)
Pt states awoke this am with blood in mouth, oral cavity visually assessed by this RN, no bleeding, bruising noted at this time. Pt states no bleeding since episode this am. Takes coumadin secondary to having "a blood clot in my leg", attends a coumadin clinic checks q month per family statement

## 2015-09-11 NOTE — Discharge Instructions (Signed)
Your INR was elevated today at 6.38.  Do not take your coumadin for the next two days.  Contact your coumadin clinic on Monday for dosage adjustments.  Your blood sugar was elevated today, please have this rechecked by your family doctor.  Get rechecked immediately if you hit your head, develop any additional bleeding, or have new/worrisome symptoms.     Warfarin Coagulopathy Warfarin (Coumadin) coagulopathy refers to bleeding that may occur as a complication of the medicine warfarin. Warfarin is an oral blood thinner (anticoagulant). Warfarin is used for medical conditions where thinning of the blood is needed to prevent blood clots.  CAUSES Bleeding is the most common and most serious complication of warfarin. The amount of bleeding is related to the warfarin dose and length of treatment. In addition, bleeding complications can also occur due to:  Intentional or accidental warfarin overdose.  Underlying medical conditions.  Dietary changes.  Medicine, herbal, supplement, or alcohol interactions. SYMPTOMS Severe bleeding while on warfarin may occur from any tissue or organ. Symptoms of the blood being too thin may include:  Bleeding from the nose or gums.  Blood in bowel movements which may appear as bright red, dark, or black tarry stools.  Blood in the urine which may appear as pink, red, or brown urine.  Unusual bruising or bruising easily.  A cut that does not stop bleeding within 10 minutes.  Vomiting blood or continuous nausea for more than 1 day.  Coughing up blood.  Broken blood vessels in your eye (subconjunctival hemorrhage).  Abdominal or back pain with or without flank bruising.  Sudden, severe headache.  Sudden weakness or numbness of the face, arm, or leg, especially on one side of the body.  Sudden confusion.  Trouble speaking (aphasia) or understanding.  Sudden trouble seeing in one or both eyes.  Sudden trouble walking.  Dizziness.  Loss of balance  or coordination.  Vaginal bleeding.  Swelling or pain at an injection site.  Superficial fat tissue death (necrosis) which may cause skin scarring. This is more common in women and may first present as pain in the waist, thighs, or buttocks. HOME CARE INSTRUCTIONS  Always contact your health care provider of any concerns or signs of possible warfarin coagulopathy as soon as possible.  Take warfarin exactly as directed by your health care provider. It is recommended that you take your warfarin dose at the same time of the day. If you have been told to stop taking warfarin, do not resume taking warfarin until directed to do so by your health care provider. Follow your health care provider's instructions if you accidentally take an extra dose or miss a dose of warfarin. It is very important to take warfarin as directed since bleeding or blood clots could result in chronic or permanent injury, pain, or disability.  Keep all follow-up appointments with your health care provider as directed. It is very important to keep your appointments. Not keeping appointments could result in a chronic or permanent injury, pain, or disability because warfarin is a medicine that requires close monitoring.  While taking warfarin, you will need to have regular blood tests to measure your blood clotting time. These blood tests usually include both the prothrombin time (PT) and International Normalized Ratio (INR) tests. The PT and INR results allow your health care provider to adjust your dose of warfarin. The dose can change for many reasons. It is critically important that you have your PT and INR levels drawn exactly as directed. Your warfarin  dose may stay the same or change depending on what the PT and INR results are. Be sure to follow up with your health care provider regarding your PT and INR test results and what your warfarin dosage should be.  Many medicines can interfere with warfarin and affect the PT and INR  results. You must tell your health care provider about any and all medicines you take. This includes all vitamins and supplements. Ask your health care provider before taking these. Prescription and over-the-counter medicine consistency is critical to warfarin management. It is important that potential interactions are checked before you start a new medicine. Be especially cautious with aspirin and anti-inflammatory medicines. Ask your health care provider before taking these. Medicines such as antibiotics and acid-reducing medicine can interact with warfarin and can cause an increased warfarin effect. Warfarin can also interfere with the effectiveness of medicines you are taking. Do not take or discontinue any prescribed or over-the-counter medicine except on the advice of your health care provider or pharmacist.  Some vitamins, supplements, and herbal products interfere with the effectiveness of warfarin. Vitamin E may increase the anticoagulant effects of warfarin. Vitamin K can cause warfarin to be less effective. Do not take or discontinue any vitamin, supplement, or herbal product except on the advice of your health care provider or pharmacist.  Eat what you normally eat and keep the vitamin K content of your diet consistent. Avoid major changes in your diet, or notify your health care provider before changing your diet. Suddenly getting a lot more vitamin K could cause your blood to clot too quickly. A sudden decrease in vitamin K intake could cause your blood to clot too slowly. These changes in vitamin K intake could lead to dangerous blood clotsor to bleeding. To keep your vitamin K intake consistent, you must be aware of which foods contain moderate or high amounts of vitamin K. Some foods that are high in vitamin K include spinach, kale, broccoli, cabbage, greens, Brussels sprouts, asparagus, bok choy, coleslaw, and parsley. If you drink green tea, drink the same amount each day. Arrange a visit  with a dietitian to answer your questions.  If you have a loss of appetite or get the stomach flu (viral gastroenteritis), talk to your health care provider as soon as possible. A decrease in your normal vitamin K intake can make you more sensitive to your usual dose of warfarin.  Some medical conditions may increase your risk for bleeding while you are taking warfarin. A fever, diarrhea lasting more than a day, worsening heart failure, or worsening liver function are some medical conditions that could affect warfarin. Contact your health care provider if you have any of these medical conditions.  Be careful not to cut yourself when using sharp objects or while shaving.  Alcohol can change the body's ability to handle warfarin. It is best to avoid alcoholic drinks or consume only very small amounts while taking warfarin. Notify your health care provider if you change your alcohol intake. A sudden increase in alcohol use can increase your risk of bleeding. Chronic alcohol use can cause warfarin to be less effective.  Limit physical activities or sports that could result in a fall or cause injury.  Do not use warfarin if you are pregnant.  Inform all your health care providers and your dentist that you take warfarin.  Inform all health care providers if you are taking warfarin and aspirin or platelet inhibitor medicines such as clopidogrel, ticagrelor, or prasugrel. Use of  these medicines in addition to warfarin can increase your risk of bleeding or death. Taking these medicines together should only be done under the direct care of your health care providers. SEEK IMMEDIATE MEDICAL CARE IF:  You cough up blood.  You have dark or black stools or there is bright red blood coming from your rectum.  You vomit blood or have nausea for more than 1 day.  You have blood in the urine or pink-colored urine.  You have unusual bruising or have increased bruising.  You have bleeding from the nose or  gums that does not stop quickly.  You have a cut that does not stop bleeding within 2-3 minutes.  You have sudden weakness or numbness of the face, arm, or leg, especially on one side of the body.  You have sudden confusion.  You have trouble speaking (aphasia) or understanding.  You have sudden trouble seeing in one or both eyes.  You have sudden trouble walking.  You have dizziness.  You have a loss of balance or coordination.  You have a sudden, severe headache.  You have a serious fall or head injury, even if you are not bleeding.  You have swelling or pain at an injection site.  You have unexplained tenderness or pain in the abdomen, back, waist, thighs, or buttocks. Any of these symptoms may represent a serious problem that is an emergency. Do not wait to see if the symptoms will go away. Get medical help right away. Call your local emergency services (911 in [place] Do not drive yourself to the hospital.   This information is not intended to replace advice given to you by your health care provider. Make sure you discuss any questions you have with your health care provider.   Document Released: 06/11/2006 Document Revised: 11/17/2014 Document Reviewed: 12/12/2011 [employer] Interactive Patient Education [employer]

## 2015-09-11 NOTE — ED Provider Notes (Signed)
CSN: [id number]     Arrival date & time 09/11/15  1330 History  By signing my name below, I, Jill Crane, attest that this documentation has been prepared under the direction and in the presence of Tilden Fossa, MD. Electronically Signed: Bethel Crane, ED Scribe. 09/11/2015. 4:03 PM   Chief Complaint  Patient presents with  . mouth bleeding-on coumadin     The history is provided by the patient. No language interpreter was used.   Jill Crane is a 75 y.o. female with history of pemphigus, blood clots on coumadin, HTN, DM, COPD, and CHF who presents to the Emergency Department complaining of oral bleeding with onset this morning. Pt states that she woke up this morning with a small amount of blood on her pillow. She denies any oral pain. Pt wears dentures and notes that she has had them for "a good while".  Associated symptoms include generalized weakness. Pt denies any head injury, epistaxis, cough, chest pain, dysuria, and dark or bloody stool. She was [facility] on [address] last week and is currently taking 5 mg per day. Her granddaughter lives with her.   Past Medical History  Diagnosis Date  . COPD (chronic obstructive pulmonary disease) (HCC)   . Diabetes mellitus without complication (HCC)   . Hypertension   . Hx of blood clots   . CHF (congestive heart failure) (HCC)    History reviewed. No pertinent past surgical history. No family history on file. Social History  Substance Use Topics  . Smoking status: Former Games developer  . Smokeless tobacco: None  . Alcohol Use: No   OB History    No data available     Review of Systems  HENT: Negative for nosebleeds.        Oral bleeding  Respiratory: Negative for cough.   Cardiovascular: Negative for chest pain.  Gastrointestinal: Negative for blood in stool.  Genitourinary: Negative for dysuria.  Neurological: Positive for weakness (generalized).  All other systems reviewed and are negative.  Allergies   Review of patient's allergies indicates no known allergies.  Home Medications   Prior to Admission medications   Medication Sig Start Date End Date Taking? Authorizing Provider  albuterol (PROVENTIL HFA;VENTOLIN HFA) 108 (90 BASE) MCG/ACT inhaler Inhale 2 puffs into the lungs every 6 (six) hours as needed for wheezing or shortness of breath.    Historical Provider, MD  alendronate (FOSAMAX) 35 MG tablet Take 35 mg by mouth every 7 (seven) days. Take with a full glass of water on an empty stomach.    Historical Provider, MD  aspirin 81 MG chewable tablet Chew 81 mg by mouth daily.    Historical Provider, MD  busPIRone (BUSPAR) 5 MG tablet Take 5 mg by mouth 3 (three) times daily.    Historical Provider, MD  calcium-vitamin D (OSCAL WITH D) 500-200 MG-UNIT per tablet Take 1 tablet by mouth.    Historical Provider, MD  DILTIAZEM HCL PO Take 240 mg by mouth. Pt is not sure if she is still on this med. Does not have an updated med list    Historical Provider, MD  FUROSEMIDE PO Take by mouth as needed.    Historical Provider, MD  ipratropium-albuterol (DUONEB) 0.5-2.5 (3) MG/3ML SOLN Take 3 mLs by nebulization.    Historical Provider, MD  metFORMIN (GLUCOPHAGE) 500 MG tablet Take by mouth 2 (two) times daily with a meal.    Historical Provider, MD  metoprolol tartrate (LOPRESSOR) 25 MG tablet Take 25 mg  by mouth 2 (two) times daily.    Historical Provider, MD  omeprazole (PRILOSEC) 20 MG capsule Take 20 mg by mouth daily.    Historical Provider, MD  predniSONE (DELTASONE) 5 MG tablet Take 5 mg by mouth daily with breakfast.    Historical Provider, MD  warfarin (COUMADIN) 6 MG tablet Take 3 mg by mouth as directed.     Historical Provider, MD   BP 162/85 mmHg  Pulse 72  Temp(Src) 98.8 F (37.1 C) (Oral)  Resp 18  SpO2 97% Physical Exam  Constitutional: She is oriented to person, place, and time. She appears well-developed and well-nourished.  HENT:  Head: Normocephalic and atraumatic.   Edentulous Large abrasion over the left upper gingiva and buccal mucosa that is hemostatic  Cardiovascular: Normal rate and regular rhythm.   No murmur heard. Pulmonary/Chest: Effort normal and breath sounds normal. No respiratory distress.  Abdominal: Soft. There is no tenderness. There is no rebound and no guarding.  Musculoskeletal: She exhibits edema. She exhibits no tenderness.  1+ LLE pitting edema   Neurological: She is alert and oriented to person, place, and time.  Skin: Skin is warm and dry.  Psychiatric: She has a normal mood and affect. Her behavior is normal.  Nursing note and vitals reviewed.   ED Course  Procedures (including critical care time) DIAGNOSTIC STUDIES: Oxygen Saturation is 97% on RA,  normal by my interpretation.    COORDINATION OF CARE: 3:21 PM Discussed treatment plan which includes lab work with pt at bedside and pt agreed to plan.  4:02 PM I re-evaluated the patient and provided an update on the results of her lab work.  Labs Review Labs Reviewed  PROTIME-INR - Abnormal; Notable for the following:    Prothrombin Time 54.0 (*)    INR 6.38 (*)    All other components within normal limits  BASIC METABOLIC PANEL - Abnormal; Notable for the following:    Glucose, Bld 342 (*)    Creatinine, Ser 1.19 (*)    Calcium 8.2 (*)    GFR calc non Af Amer 44 (*)    GFR calc Af Amer 51 (*)    All other components within normal limits  CBC WITH DIFFERENTIAL/PLATELET    Imaging Review No results found. I have personally reviewed and evaluated these lab results as part of my medical decision-making.   EKG Interpretation None      MDM   Final diagnoses:  Supratherapeutic INR   Patient here for evaluation of oral bleeding, currently on Coumadin therapy. On examination he can see the source of her bleeding medicine abrasion. There is no current active bleeding in the department. INR is elevated at 6.3. Discussed with the patient's stopping her dentures  for now as these are irritating her abraded area. There is no other evidence of bleeding. Will not urgently refers her Coumadin at this time. BMP near her baseline. Discussed not taking her Coumadin for the next 2 days and following up with the Coumadin clinic on Monday for further checks and adjustments. Very close return precautions were provided for evidence of recurrent bleeding.  I personally performed the services described in this documentation, which was scribed in my presence. The recorded information has been reviewed and is accurate.    Tilden Fossa, MD 09/12/15 [phone]

## 2015-09-11 NOTE — ED Notes (Signed)
EDP informed of lab results by this RN

## 2015-09-11 NOTE — ED Notes (Signed)
Patient here reporting that when she awoke she had blood on her pillow, states from her mouth. Concerned that her coumadin level is off, checked last week. No bleeding noted on arrival, no tongue trauma or evidence of bleeding in mouth

## 2016-06-22 ENCOUNTER — Emergency Department (HOSPITAL_BASED_OUTPATIENT_CLINIC_OR_DEPARTMENT_OTHER)
Admission: EM | Admit: 2016-06-22 | Discharge: 2016-06-22 | Disposition: A | Discharge status: Home / Self Care | Payer: Medicare Other | Attending: Emergency Medicine | Admitting: Emergency Medicine

## 2016-06-22 ENCOUNTER — Emergency Department (HOSPITAL_BASED_OUTPATIENT_CLINIC_OR_DEPARTMENT_OTHER): Payer: Medicare Other

## 2016-06-22 ENCOUNTER — Encounter (HOSPITAL_BASED_OUTPATIENT_CLINIC_OR_DEPARTMENT_OTHER): Payer: Self-pay | Admitting: *Deleted

## 2016-06-22 DIAGNOSIS — E119 Type 2 diabetes mellitus without complications: Secondary | ICD-10-CM | POA: Diagnosis not present

## 2016-06-22 DIAGNOSIS — J441 Chronic obstructive pulmonary disease with (acute) exacerbation: Secondary | ICD-10-CM | POA: Insufficient documentation

## 2016-06-22 DIAGNOSIS — I509 Heart failure, unspecified: Secondary | ICD-10-CM

## 2016-06-22 DIAGNOSIS — Z7982 Long term (current) use of aspirin: Secondary | ICD-10-CM | POA: Insufficient documentation

## 2016-06-22 DIAGNOSIS — I110 Hypertensive heart disease with heart failure: Secondary | ICD-10-CM | POA: Diagnosis not present

## 2016-06-22 DIAGNOSIS — Z7901 Long term (current) use of anticoagulants: Secondary | ICD-10-CM | POA: Diagnosis not present

## 2016-06-22 DIAGNOSIS — Z7984 Long term (current) use of oral hypoglycemic drugs: Secondary | ICD-10-CM | POA: Diagnosis not present

## 2016-06-22 DIAGNOSIS — R0602 Shortness of breath: Secondary | ICD-10-CM | POA: Diagnosis present

## 2016-06-22 DIAGNOSIS — Z87891 Personal history of nicotine dependence: Secondary | ICD-10-CM | POA: Diagnosis not present

## 2016-06-22 LAB — CBC WITH DIFFERENTIAL/PLATELET
BASOS ABS: 0 10*3/uL (ref 0.0–0.1)
BASOS PCT: 0 %
EOS ABS: 0 10*3/uL (ref 0.0–0.7)
EOS PCT: 0 %
HCT: 42.5 % (ref 36.0–46.0)
Hemoglobin: 14.2 g/dL (ref 12.0–15.0)
Lymphocytes Relative: 25 %
Lymphs Abs: 2.6 10*3/uL (ref 0.7–4.0)
MCH: 29.2 pg (ref 26.0–34.0)
MCHC: 33.4 g/dL (ref 30.0–36.0)
MCV: 87.4 fL (ref 78.0–100.0)
Monocytes Absolute: 0.9 10*3/uL (ref 0.1–1.0)
Monocytes Relative: 9 %
NEUTROS PCT: 66 %
Neutro Abs: 6.9 10*3/uL (ref 1.7–7.7)
PLATELETS: 151 10*3/uL (ref 150–400)
RBC: 4.86 MIL/uL (ref 3.87–5.11)
RDW: 14.9 % (ref 11.5–15.5)
WBC: 10.4 10*3/uL (ref 4.0–10.5)

## 2016-06-22 LAB — COMPREHENSIVE METABOLIC PANEL
ALT: 16 U/L (ref 14–54)
AST: 17 U/L (ref 15–41)
Albumin: 3.3 g/dL — ABNORMAL LOW (ref 3.5–5.0)
Alkaline Phosphatase: 70 U/L (ref 38–126)
Anion gap: 8 (ref 5–15)
BILIRUBIN TOTAL: 0.8 mg/dL (ref 0.3–1.2)
BUN: 12 mg/dL (ref 6–20)
CO2: 26 mmol/L (ref 22–32)
CREATININE: 0.81 mg/dL (ref 0.44–1.00)
Calcium: 8.9 mg/dL (ref 8.9–10.3)
Chloride: 105 mmol/L (ref 101–111)
Glucose, Bld: 155 mg/dL — ABNORMAL HIGH (ref 65–99)
POTASSIUM: 3.6 mmol/L (ref 3.5–5.1)
Sodium: 139 mmol/L (ref 135–145)
TOTAL PROTEIN: 6.3 g/dL — AB (ref 6.5–8.1)

## 2016-06-22 LAB — PROTIME-INR
INR: 2.56
PROTHROMBIN TIME: 28 s — AB (ref 11.4–15.2)

## 2016-06-22 LAB — TROPONIN I

## 2016-06-22 LAB — MAGNESIUM: Magnesium: 1.6 mg/dL — ABNORMAL LOW (ref 1.7–2.4)

## 2016-06-22 LAB — BRAIN NATRIURETIC PEPTIDE: B NATRIURETIC PEPTIDE 5: 364.5 pg/mL — AB (ref 0.0–100.0)

## 2016-06-22 MED ORDER — PREDNISONE 10 MG PO TABS: 40.0000 mg | ORAL_TABLET | Freq: Every day | ORAL | 0 refills | Status: AC

## 2016-06-22 MED ORDER — PREDNISONE 50 MG PO TABS
60.0000 mg | ORAL_TABLET | Freq: Once | ORAL | Status: AC
  Administered 2016-06-22: 60 mg via ORAL
  Filled 2016-06-22: qty 1

## 2016-06-22 MED ORDER — MAGNESIUM OXIDE 400 (241.3 MG) MG PO TABS: 400.0000 mg | ORAL_TABLET | Freq: Every day | ORAL | 0 refills | Status: DC

## 2016-06-22 MED ORDER — ALBUTEROL SULFATE HFA 108 (90 BASE) MCG/ACT IN AERS
2.0000 | INHALATION_SPRAY | Freq: Once | RESPIRATORY_TRACT | Status: AC
  Administered 2016-06-22: 2 via RESPIRATORY_TRACT
  Filled 2016-06-22: qty 6.7

## 2016-06-22 MED ORDER — FUROSEMIDE 10 MG/ML IJ SOLN
20.0000 mg | Freq: Once | INTRAMUSCULAR | Status: AC
  Administered 2016-06-22: 20 mg via INTRAVENOUS
  Filled 2016-06-22: qty 2

## 2016-06-22 MED ORDER — MAGNESIUM OXIDE 400 (241.3 MG) MG PO TABS
400.0000 mg | ORAL_TABLET | Freq: Two times a day (BID) | ORAL | 0 refills | Status: AC
Start: 2016-06-22 — End: 2016-06-26

## 2016-06-22 NOTE — ED Provider Notes (Signed)
[facility] Provider Note   CSN: [id number] Arrival date & time: 06/22/16  1749   By signing my name below, I, Jill Crane, attest that this documentation has been prepared under the direction and in the presence of Jill MondayErin Roshon Duell, MD . Electronically Signed: Nelwyn SalisburyJoshua Crane, Scribe. 06/22/2016. 7:01 PM.  History   Chief Complaint Chief Complaint  Patient presents with  . Shortness of Breath   The history is provided by the patient and a relative. No language interpreter was used.    HPI Comments:  Jill Crane is a 75 y.o. female with pmhx of CHF, pemphigus vulgaris and COPD who presents to the Emergency Department complaining of chronic exacerbated SOB beginning about a week ago. Pt states that due to her history of COPD she sometimes gets short of breath but she has been particularly bad the past week. Pt's granddaughter states the pt has tried her at-home inhaler with mild relief. Patient states that she feels like she has fluid on her. Reports she has been waking up short of breath at night and sitting on the side of the bed. Also reports increasing lower extremity swelling, reports LLE chronically more swollen than right and has been evaluated by US for blood clots in the past.  She reports associated nausea, productive cough of white sputum, and lower extremity swelling which has worsened this past week. Pt denies any CP or recent falls. She has recently changed her medications to include Lasix with recent diagnosis of CHF per pt.  Past Medical History:  Diagnosis Date  . CHF (congestive heart failure) (HCC)   . COPD (chronic obstructive pulmonary disease) (HCC)   . Diabetes mellitus without complication (HCC)   . Hx of blood clots   . Hypertension     There are no active problems to display for this patient.   History reviewed. No pertinent surgical history.  OB History    No data available       Home Medications    Prior to Admission medications     Medication Sig Start Date End Date Taking? Authorizing Provider  albuterol (PROVENTIL HFA;VENTOLIN HFA) 108 (90 BASE) MCG/ACT inhaler Inhale 2 puffs into the lungs every 6 (six) hours as needed for wheezing or shortness of breath.    Historical Provider, MD  alendronate (FOSAMAX) 35 MG tablet Take 35 mg by mouth every 7 (seven) days. Take with a full glass of water on an empty stomach.    Historical Provider, MD  aspirin 81 MG chewable tablet Chew 81 mg by mouth daily.    Historical Provider, MD  busPIRone (BUSPAR) 5 MG tablet Take 5 mg by mouth 3 (three) times daily.    Historical Provider, MD  calcium-vitamin D (OSCAL WITH D) 500-200 MG-UNIT per tablet Take 1 tablet by mouth.    Historical Provider, MD  DILTIAZEM HCL PO Take 240 mg by mouth. Pt is not sure if she is still on this med. Does not have an updated med list    Historical Provider, MD  FUROSEMIDE PO Take by mouth as needed.    Historical Provider, MD  ipratropium-albuterol (DUONEB) 0.5-2.5 (3) MG/3ML SOLN Take 3 mLs by nebulization.    Historical Provider, MD  magnesium oxide (MAG-OX) 400 (241.3 Mg) MG tablet Take 1 tablet (400 mg total) by mouth 2 (two) times daily. 06/22/16 06/26/16  Jill MondayErin Victorious Kundinger, MD  metFORMIN (GLUCOPHAGE) 500 MG tablet Take by mouth 2 (two) times daily with a meal.    Historical Provider, MD  metoprolol tartrate (LOPRESSOR) 25 MG tablet Take 25 mg by mouth 2 (two) times daily.    Historical Provider, MD  omeprazole (PRILOSEC) 20 MG capsule Take 20 mg by mouth daily.    Historical Provider, MD  predniSONE (DELTASONE) 10 MG tablet Take 4 tablets (40 mg total) by mouth daily. 06/22/16 06/26/16  Jill Monday, MD  warfarin (COUMADIN) 6 MG tablet Take 3 mg by mouth as directed.     Historical Provider, MD    Family History No family history on file.  Social History Social History  Substance Use Topics  . Smoking status: Former Games developer  . Smokeless tobacco: Not on file  . Alcohol use No     Allergies    Patient has no known allergies.   Review of Systems Review of Systems  Constitutional: Negative for fever.  HENT: Positive for congestion. Negative for sore throat.   Eyes: Negative for visual disturbance.  Respiratory: Positive for cough, shortness of breath and wheezing.   Cardiovascular: Positive for leg swelling. Negative for chest pain.  Gastrointestinal: Positive for nausea. Negative for abdominal pain.  Genitourinary: Negative for difficulty urinating.  Musculoskeletal: Positive for myalgias (Right Shoulder worse with movement). Negative for back pain and neck pain.  Skin: Negative for rash.  Neurological: Negative for syncope and headaches.     Physical Exam Updated Vital Signs BP 133/90   Pulse 91   Temp 98.8 F (37.1 C) (Oral)   Resp 23   Ht 5\' 4"  (1.626 m)   Wt 200 lb (90.7 kg)   SpO2 97%   BMI 34.33 kg/m   Physical Exam  Constitutional: She is oriented to person, place, and time. She appears well-developed and well-nourished. No distress.  HENT:  Head: Normocephalic and atraumatic.  Eyes: Conjunctivae and EOM are normal.  Neck: Normal range of motion.  Cardiovascular: Normal rate, regular rhythm, normal heart sounds and intact distal pulses.  Exam reveals no gallop and no friction rub.   No murmur heard. Pulmonary/Chest: Effort normal. No respiratory distress. She has wheezes. She has no rales.  Expiratory wheezing. Bilateral 2+ edema.  Abdominal: Soft. She exhibits no distension. There is no tenderness. There is no guarding.  Musculoskeletal: She exhibits edema. She exhibits no tenderness.  Neurological: She is alert and oriented to person, place, and time.  Skin: Skin is warm and dry. No rash noted. She is not diaphoretic. No erythema.  Nursing note and vitals reviewed.    ED Treatments / Results  DIAGNOSTIC STUDIES:  Oxygen Saturation is 97% on RA, normal by my interpretation.    COORDINATION OF CARE:  7:34 PM Discussed treatment plan with pt  at bedside and pt agreed to plan.  Labs (all labs ordered are listed, but only abnormal results are displayed) Labs Reviewed  BRAIN NATRIURETIC PEPTIDE - Abnormal; Notable for the following:       Result Value   B Natriuretic Peptide 364.5 (*)    All other components within normal limits  PROTIME-INR - Abnormal; Notable for the following:    Prothrombin Time 28.0 (*)    All other components within normal limits  MAGNESIUM - Abnormal; Notable for the following:    Magnesium 1.6 (*)    All other components within normal limits  COMPREHENSIVE METABOLIC PANEL - Abnormal; Notable for the following:    Glucose, Bld 155 (*)    Total Protein 6.3 (*)    Albumin 3.3 (*)    All other components within normal limits  CBC WITH  DIFFERENTIAL/PLATELET  TROPONIN I    EKG  EKG Interpretation  Date/Time:  Thursday June 22 2016 19:06:44 EST Ventricular Rate:  78 PR Interval:    QRS Duration: 85 QT Interval:  361 QTC Calculation: 412 R Axis:   57 Text Interpretation:  Atrial fibrillation Borderline T wave abnormalities No significant change since last tracing Confirmed by [facility], Denny PeonERIN ([id number]) on 06/22/2016 8:56:12 PM Also confirmed by [facility], Lismary Kiehn ([id number]), editor [place]  CCT, BEVERLY ([id number])  on 06/23/2016 6:52:42 AM       Radiology Dg Chest 2 View  Result Date: 06/22/2016 CLINICAL DATA:  75 year old female with shortness of breath today. Abdomen and bilateral lower extremity swelling. Initial encounter. EXAM: CHEST  2 VIEW COMPARISON:  08/16/2014 and earlier. FINDINGS: Cardiomegaly appears mildly progressed since 2016. Tortuous descending thoracic aorta appears stable. Superimposed thoracic kyphoscoliosis. Other mediastinal contours are within normal limits. Visualized tracheal air column is within normal limits. Stable somewhat large lung volumes with no pneumothorax, pulmonary edema, pleural effusion or confluent pulmonary opacity. Abdominal IVC filter re- demonstrated. No  acute osseous abnormality identified. IMPRESSION: Cardiomegaly may have mildly progressed since 2016, but there is no superimposed acute cardiopulmonary abnormality. Electronically Signed   By: Odessa FlemingH  Hall M.D.   On: 06/22/2016 19:47    Procedures Procedures (including critical care time)  Medications Ordered in ED Medications  predniSONE (DELTASONE) tablet 60 mg (60 mg Oral Given 06/22/16 2115)  albuterol (PROVENTIL HFA;VENTOLIN HFA) 108 (90 Base) MCG/ACT inhaler 2 puff (2 puffs Inhalation Given 06/22/16 2117)  furosemide (LASIX) injection 20 mg (20 mg Intravenous Given 06/22/16 2236)     Initial Impression / Assessment and Plan / ED Course  I have reviewed the triage vital signs and the nursing notes.  Pertinent labs & imaging results that were available during my care of the patient were reviewed by me and considered in my medical decision making (see chart for details).  Clinical Course    75yo female who resports history of CHF, COPD/asthma, atrial fibrillation on coumadin, DM, pemphigus vulgaris, CKD, hx DVT, presents with concern for dyspnea over one week with associated orthopnea, wheezing, productive cough.  XR shows no pneumonia or edema, however shows mildly increased cardiomegaly from prior, BNP slight increase from 273 to 364.  INR therapeutic, and given wheezing, slow onset, orthopnea feel COPD exacerbation and CHF exacerbation more likley than PE.  Patient with mild increase fluid overload and given one dose IV lasix 20mg , told to continue lasix as rx.  She also has wheezing on exam, increased cough, will treat for COPD/asthma exacerbation with steroids x5 days, then pt to continue 5mg  prednisone.  Mg low and pt given Mg replacement and recommend PCP follow up. Patient discharged in stable condition with understanding of reasons to return.    Final Clinical Impressions(s) / ED Diagnoses   Final diagnoses:  Acute on chronic congestive heart failure, unspecified congestive heart  failure type (HCC)  COPD exacerbation (HCC)    New Prescriptions Discharge Medication List as of 06/22/2016 10:31 PM    I personally performed the services described in this documentation, which was scribed in my presence. The recorded information has been reviewed and is accurate.     Jill MondayErin Ronya Gilcrest, MD 06/23/16 1042

## 2016-06-22 NOTE — ED Notes (Signed)
Ambulated through department, became short of breath but reports this is the norm for her.  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

## 2016-06-22 NOTE — ED Triage Notes (Addendum)
Pt c/o SOB x 1 week . Also c/o leg swelling The patient has a history of COPD and CHF

## 2017-04-15 ENCOUNTER — Emergency Department (HOSPITAL_BASED_OUTPATIENT_CLINIC_OR_DEPARTMENT_OTHER): Payer: Medicare Other

## 2017-04-15 ENCOUNTER — Emergency Department (HOSPITAL_BASED_OUTPATIENT_CLINIC_OR_DEPARTMENT_OTHER)
Admission: EM | Admit: 2017-04-15 | Discharge: 2017-04-15 | Disposition: A | Discharge status: Home / Self Care | Payer: Medicare Other | Attending: Emergency Medicine | Admitting: Emergency Medicine

## 2017-04-15 ENCOUNTER — Encounter (HOSPITAL_BASED_OUTPATIENT_CLINIC_OR_DEPARTMENT_OTHER): Payer: Self-pay | Admitting: Emergency Medicine

## 2017-04-15 DIAGNOSIS — E119 Type 2 diabetes mellitus without complications: Secondary | ICD-10-CM | POA: Diagnosis not present

## 2017-04-15 DIAGNOSIS — I509 Heart failure, unspecified: Secondary | ICD-10-CM | POA: Insufficient documentation

## 2017-04-15 DIAGNOSIS — Z7982 Long term (current) use of aspirin: Secondary | ICD-10-CM | POA: Diagnosis not present

## 2017-04-15 DIAGNOSIS — Z87891 Personal history of nicotine dependence: Secondary | ICD-10-CM | POA: Insufficient documentation

## 2017-04-15 DIAGNOSIS — R0602 Shortness of breath: Secondary | ICD-10-CM | POA: Diagnosis present

## 2017-04-15 DIAGNOSIS — R140 Abdominal distension (gaseous): Secondary | ICD-10-CM | POA: Insufficient documentation

## 2017-04-15 DIAGNOSIS — I110 Hypertensive heart disease with heart failure: Secondary | ICD-10-CM | POA: Diagnosis not present

## 2017-04-15 DIAGNOSIS — Z7984 Long term (current) use of oral hypoglycemic drugs: Secondary | ICD-10-CM | POA: Diagnosis not present

## 2017-04-15 DIAGNOSIS — Z7901 Long term (current) use of anticoagulants: Secondary | ICD-10-CM | POA: Insufficient documentation

## 2017-04-15 LAB — CBC WITH DIFFERENTIAL/PLATELET
BASOS ABS: 0 10*3/uL (ref 0.0–0.1)
BASOS PCT: 0 %
EOS PCT: 0 %
Eosinophils Absolute: 0 10*3/uL (ref 0.0–0.7)
HEMATOCRIT: 42.6 % (ref 36.0–46.0)
HEMOGLOBIN: 14.1 g/dL (ref 12.0–15.0)
Lymphocytes Relative: 27 %
Lymphs Abs: 1.9 10*3/uL (ref 0.7–4.0)
MCH: 29 pg (ref 26.0–34.0)
MCHC: 33.1 g/dL (ref 30.0–36.0)
MCV: 87.7 fL (ref 78.0–100.0)
MONO ABS: 0.6 10*3/uL (ref 0.1–1.0)
MONOS PCT: 8 %
Neutro Abs: 4.6 10*3/uL (ref 1.7–7.7)
Neutrophils Relative %: 65 %
Platelets: 148 10*3/uL — ABNORMAL LOW (ref 150–400)
RBC: 4.86 MIL/uL (ref 3.87–5.11)
RDW: 15.4 % (ref 11.5–15.5)
WBC: 7.1 10*3/uL (ref 4.0–10.5)

## 2017-04-15 LAB — BASIC METABOLIC PANEL
Anion gap: 6 (ref 5–15)
BUN: 11 mg/dL (ref 6–20)
CO2: 27 mmol/L (ref 22–32)
Calcium: 8.4 mg/dL — ABNORMAL LOW (ref 8.9–10.3)
Chloride: 107 mmol/L (ref 101–111)
Creatinine, Ser: 0.85 mg/dL (ref 0.44–1.00)
GFR calc non Af Amer: >60 mL/min (ref >60)
Glucose, Bld: 239 mg/dL — ABNORMAL HIGH (ref 65–99)
POTASSIUM: 3.5 mmol/L (ref 3.5–5.1)
SODIUM: 140 mmol/L (ref 135–145)

## 2017-04-15 LAB — PROTIME-INR
INR: 2.89
Prothrombin Time: 30 seconds — ABNORMAL HIGH (ref 11.4–15.2)

## 2017-04-15 LAB — BRAIN NATRIURETIC PEPTIDE: B Natriuretic Peptide: 499.9 pg/mL — ABNORMAL HIGH (ref 0.0–100.0)

## 2017-04-15 LAB — TROPONIN I

## 2017-04-15 MED ORDER — FUROSEMIDE 10 MG/ML IJ SOLN
20.0000 mg | Freq: Once | INTRAMUSCULAR | Status: AC
  Administered 2017-04-15: 20 mg via INTRAVENOUS
  Filled 2017-04-15: qty 2

## 2017-04-15 MED ORDER — FUROSEMIDE 20 MG PO TABS: ORAL_TABLET | ORAL | 0 refills | Status: DC

## 2017-04-15 NOTE — Discharge Instructions (Signed)
Contact a health care provider if: You have a rapid weight gain. You have increasing shortness of breath that is unusual for you. You are unable to participate in your usual physical activities. You tire easily. You cough more than normal, especially with physical activity. You have any swelling or more swelling in areas such as your hands, feet, ankles, or abdomen. You are unable to sleep because it is hard to breathe. You feel like your heart is beating quickly (palpitations). You become dizzy or light-headed when you stand up. Get help right away if: You have difficulty breathing. You notice or your family notices a change in your awareness, such as having trouble staying awake or having difficulty with concentration. You have pain or discomfort in your chest. You have an episode of fainting (syncope).

## 2017-04-15 NOTE — ED Notes (Signed)
Patient transported to X-ray 

## 2017-04-15 NOTE — ED Triage Notes (Signed)
Patient states that she has been progressively SOB over the last 3 weeks. Last night was the worst. SHe gets SOB laying down and has to sit up on the side of the bed. SHe also reports swelling to her legs and stomach

## 2017-04-15 NOTE — ED Notes (Signed)
Pt discharged to home with family. NAD.  

## 2017-04-15 NOTE — ED Provider Notes (Signed)
[facility] Provider Note   CSN: [id number] Arrival date & time: 04/15/17  0950     History   Chief Complaint Chief Complaint  Patient presents with  . Shortness of Breath    HPI Jill Crane is a 76 y.o. female who presents emergency per with chief complaint of shortness of breath. She has a previous history of CHF. She states that she was worked up for his COPD and that was negative. She has had increasing swelling and weight gain over the past week. She states that it is worse at night when she tries to lay flat. Last night she woke up gasping for air in her sleep. She has noticed significant swelling in her ankles and abdomen. She has exertional dyspnea. She takes furosemide but is unsure of what dose.  HPI  Past Medical History:  Diagnosis Date  . CHF (congestive heart failure) (HCC)   . COPD (chronic obstructive pulmonary disease) (HCC)   . Diabetes mellitus without complication (HCC)   . Hx of blood clots   . Hypertension     There are no active problems to display for this patient.   History reviewed. No pertinent surgical history.  OB History    No data available       Home Medications    Prior to Admission medications   Medication Sig Start Date End Date Taking? Authorizing Provider  albuterol (PROVENTIL HFA;VENTOLIN HFA) 108 (90 BASE) MCG/ACT inhaler Inhale 2 puffs into the lungs every 6 (six) hours as needed for wheezing or shortness of breath.    [provider]  alendronate (FOSAMAX) 35 MG tablet Take 35 mg by mouth every 7 (seven) days. Take with a full glass of water on an empty stomach.    [provider]  aspirin 81 MG chewable tablet Chew 81 mg by mouth daily.    [provider]  busPIRone (BUSPAR) 5 MG tablet Take 5 mg by mouth 3 (three) times daily.    [provider]  calcium-vitamin D (OSCAL WITH D) 500-200 MG-UNIT per tablet Take 1 tablet by mouth.    [provider]  DILTIAZEM HCL PO  Take 240 mg by mouth. Pt is not sure if she is still on this med. Does not have an updated med list    [provider]  FUROSEMIDE PO Take by mouth as needed.    [provider]  ipratropium-albuterol (DUONEB) 0.5-2.5 (3) MG/3ML SOLN Take 3 mLs by nebulization.    [provider]  metFORMIN (GLUCOPHAGE) 500 MG tablet Take by mouth 2 (two) times daily with a meal.    [provider]  metoprolol tartrate (LOPRESSOR) 25 MG tablet Take 25 mg by mouth 2 (two) times daily.    [provider]  omeprazole (PRILOSEC) 20 MG capsule Take 20 mg by mouth daily.    [provider]  warfarin (COUMADIN) 6 MG tablet Take 3 mg by mouth as directed.     [provider]    Family History History reviewed. No pertinent family history.  Social History Social History  Substance Use Topics  . Smoking status: Former Games developer  . Smokeless tobacco: Never Used  . Alcohol use No     Allergies   Patient has no known allergies.   Review of Systems Review of Systems Ten systems reviewed and are negative for acute change, except as noted in the HPI.    Physical Exam Updated Vital Signs BP 107/70   Pulse 82  Temp 97.9 F (36.6 C) (Oral)   Resp (!) 21   Ht  (1.626 m)   Wt 86.2 kg (190 lb)   SpO2 90%   BMI 32.61 kg/m   Physical Exam  Constitutional: She appears well-developed and well-nourished. No distress.  HENT:  Head: Normocephalic and atraumatic.  Eyes: Pupils are equal, round, and reactive to light. EOM are normal.  Neck: Normal range of motion. Neck supple. JVD present.  Pulmonary/Chest: She has rales (course crackles to upper lung fields).  Abdominal: Soft. Bowel sounds are normal. She exhibits distension (fluid wave +). There is no tenderness. There is no guarding.     ED Treatments / Results  Labs (all labs ordered are listed, but only abnormal results are displayed) Labs Reviewed  CBC WITH DIFFERENTIAL/PLATELET -  Abnormal; Notable for the following:       Result Value   Platelets 148 (*)    All other components within normal limits  BRAIN NATRIURETIC PEPTIDE - Abnormal; Notable for the following:    B Natriuretic Peptide 499.9 (*)    All other components within normal limits  BASIC METABOLIC PANEL - Abnormal; Notable for the following:    Glucose, Bld 239 (*)    Calcium 8.4 (*)    All other components within normal limits  PROTIME-INR - Abnormal; Notable for the following:    Prothrombin Time 30.0 (*)    All other components within normal limits  TROPONIN I    EKG  EKG Interpretation None       Radiology Dg Chest 2 View  Result Date: 04/15/2017 CLINICAL DATA:  Acute shortness of breath for 3 weeks. Bilateral leg swelling. EXAM: CHEST  2 VIEW COMPARISON:  06/22/2016 and prior chest radiographs FINDINGS: Cardiomegaly again noted. There is no evidence of focal airspace disease, pulmonary edema, suspicious pulmonary nodule/mass, pleural effusion, or pneumothorax. No acute bony abnormalities are identified. IMPRESSION: Cardiomegaly without evidence of acute cardiopulmonary disease. Electronically Signed   By: Harmon Pier M.D.   On: 04/15/2017 11:01    Procedures Procedures (including critical care time)  Medications Ordered in ED Medications - No data to display   Initial Impression / Assessment and Plan / ED Course  I have reviewed the triage vital signs and the nursing notes.  Pertinent labs & imaging results that were available during my care of the patient were reviewed by me and considered in my medical decision making (see chart for details).     Patient with CHF exacerbation. Given Lasix here in the emergency department with good urine output. Patient feels greatly improved. Seen in shared visit with Dr. Donnald Garre. Patient is safe for discharge. She will be discharged with an increase in her Lasix dosage for the next 2 days then she may resume her normal daily doses. She states  she has been out of her Lasix for several days. Discussed return precautions.  Final Clinical Impressions(s) / ED Diagnoses   Final diagnoses:  Acute on chronic congestive heart failure, unspecified heart failure type [facility])    New Prescriptions New Prescriptions   No medications on file     Arthor Captain, PA-C 04/15/17 1707    Arby Barrette, MD 04/20/17 [phone]

## 2017-04-15 NOTE — ED Notes (Signed)
BSC placed at bedside.

## 2017-04-15 NOTE — ED Notes (Signed)
Pt on cardiac monitor and auto VS 

## 2018-02-23 ENCOUNTER — Other Ambulatory Visit: Payer: Self-pay

## 2018-02-23 ENCOUNTER — Encounter (HOSPITAL_BASED_OUTPATIENT_CLINIC_OR_DEPARTMENT_OTHER): Payer: Self-pay | Admitting: Emergency Medicine

## 2018-02-23 ENCOUNTER — Emergency Department (HOSPITAL_BASED_OUTPATIENT_CLINIC_OR_DEPARTMENT_OTHER): Payer: Medicare Other

## 2018-02-23 ENCOUNTER — Emergency Department (HOSPITAL_BASED_OUTPATIENT_CLINIC_OR_DEPARTMENT_OTHER)
Admission: EM | Admit: 2018-02-23 | Discharge: 2018-02-23 | Disposition: A | Discharge status: Home / Self Care | Payer: Medicare Other | Attending: Emergency Medicine | Admitting: Emergency Medicine

## 2018-02-23 DIAGNOSIS — J449 Chronic obstructive pulmonary disease, unspecified: Secondary | ICD-10-CM | POA: Diagnosis not present

## 2018-02-23 DIAGNOSIS — R0602 Shortness of breath: Secondary | ICD-10-CM | POA: Diagnosis present

## 2018-02-23 DIAGNOSIS — Z7984 Long term (current) use of oral hypoglycemic drugs: Secondary | ICD-10-CM | POA: Insufficient documentation

## 2018-02-23 DIAGNOSIS — M79662 Pain in left lower leg: Secondary | ICD-10-CM | POA: Insufficient documentation

## 2018-02-23 DIAGNOSIS — Z79899 Other long term (current) drug therapy: Secondary | ICD-10-CM | POA: Insufficient documentation

## 2018-02-23 DIAGNOSIS — Z87891 Personal history of nicotine dependence: Secondary | ICD-10-CM | POA: Diagnosis not present

## 2018-02-23 DIAGNOSIS — E119 Type 2 diabetes mellitus without complications: Secondary | ICD-10-CM | POA: Diagnosis not present

## 2018-02-23 DIAGNOSIS — I10 Essential (primary) hypertension: Secondary | ICD-10-CM | POA: Insufficient documentation

## 2018-02-23 DIAGNOSIS — Z7982 Long term (current) use of aspirin: Secondary | ICD-10-CM | POA: Diagnosis not present

## 2018-02-23 DIAGNOSIS — M7989 Other specified soft tissue disorders: Secondary | ICD-10-CM

## 2018-02-23 HISTORY — DX: Unspecified atrial fibrillation: I48.91

## 2018-02-23 LAB — BRAIN NATRIURETIC PEPTIDE: B Natriuretic Peptide: 450 pg/mL — ABNORMAL HIGH (ref 0.0–100.0)

## 2018-02-23 LAB — BASIC METABOLIC PANEL
Anion gap: 10 (ref 5–15)
BUN: 15 mg/dL (ref 8–23)
CALCIUM: 8.7 mg/dL — AB (ref 8.9–10.3)
CHLORIDE: 107 mmol/L (ref 98–111)
CO2: 19 mmol/L — AB (ref 22–32)
CREATININE: 0.96 mg/dL (ref 0.44–1.00)
GFR calc Af Amer: >60 mL/min (ref >60)
GFR calc non Af Amer: 56 mL/min — ABNORMAL LOW (ref >60)
GLUCOSE: 269 mg/dL — AB (ref 70–99)
Potassium: 4.4 mmol/L (ref 3.5–5.1)
Sodium: 136 mmol/L (ref 135–145)

## 2018-02-23 LAB — CBC
HCT: 43.5 % (ref 36.0–46.0)
HEMOGLOBIN: 14.7 g/dL (ref 12.0–15.0)
MCH: 28.6 pg (ref 26.0–34.0)
MCHC: 33.8 g/dL (ref 30.0–36.0)
MCV: 84.6 fL (ref 78.0–100.0)
Platelets: 170 10*3/uL (ref 150–400)
RBC: 5.14 MIL/uL — ABNORMAL HIGH (ref 3.87–5.11)
RDW: 14.9 % (ref 11.5–15.5)
WBC: 7.7 10*3/uL (ref 4.0–10.5)

## 2018-02-23 LAB — PROTIME-INR
INR: 1.56
Prothrombin Time: 18.6 seconds — ABNORMAL HIGH (ref 11.4–15.2)

## 2018-02-23 NOTE — ED Provider Notes (Addendum)
[facility] EMERGENCY DEPARTMENT Provider Note   CSN: [id number] Arrival date & time: 02/23/18  1302     History   Chief Complaint Chief Complaint  Patient presents with  . Shortness of Breath    HPI Jill Crane is a 77 y.o. female.  Patient with a known history of CHF as well as COPD.  Presents with some increased shortness of breath overnight.  Also patient with increased swelling and left leg pain.  Always has some degree of swelling but it is worse.  It is painful to walk on.  No fall or injury.  No chest pain.  No feeling as if she is going to pass out.  No focal neuro deficits.  Discussed with family the walking problem is due to pain there is not a gait abnormality.     Past Medical History:  Diagnosis Date  . Atrial fibrillation with normal ventricular rate (HCC)   . CHF (congestive heart failure) (HCC)   . COPD (chronic obstructive pulmonary disease) (HCC)   . Diabetes mellitus without complication (HCC)   . Hx of blood clots   . Hypertension     There are no active problems to display for this patient.   History reviewed. No pertinent surgical history.   OB History   None      Home Medications    Prior to Admission medications   Medication Sig Start Date End Date Taking? Authorizing Provider  albuterol (PROVENTIL HFA;VENTOLIN HFA) 108 (90 BASE) MCG/ACT inhaler Inhale 2 puffs into the lungs every 6 (six) hours as needed for wheezing or shortness of breath.    [provider]  alendronate (FOSAMAX) 35 MG tablet Take 35 mg by mouth every 7 (seven) days. Take with a full glass of water on an empty stomach.    [provider]  aspirin 81 MG chewable tablet Chew 81 mg by mouth daily.    [provider]  busPIRone (BUSPAR) 5 MG tablet Take 5 mg by mouth 3 (three) times daily.    [provider]  calcium-vitamin D (OSCAL WITH D) 500-200 MG-UNIT per tablet Take 1 tablet by mouth.    [provider]    DILTIAZEM HCL PO Take 240 mg by mouth. Pt is not sure if she is still on this med. Does not have an updated med list    [provider]  furosemide (LASIX) 20 MG tablet Take 40 mg (2 tablets) tomorrow and the day after, then continue with 20 mg daily 04/15/17   Harris, Cammy Copa, PA-C  ipratropium-albuterol (DUONEB) 0.5-2.5 (3) MG/3ML SOLN Take 3 mLs by nebulization.    [provider]  metFORMIN (GLUCOPHAGE) 500 MG tablet Take by mouth 2 (two) times daily with a meal.    [provider]  metoprolol succinate (TOPROL-XL) 25 MG 24 hr tablet Take 25 mg by mouth daily.    [provider]  metoprolol tartrate (LOPRESSOR) 25 MG tablet Take 25 mg by mouth 2 (two) times daily.    [provider]  omeprazole (PRILOSEC) 20 MG capsule Take 20 mg by mouth daily.    [provider]  predniSONE (DELTASONE) 10 MG tablet Take 5 mg by mouth daily with breakfast.    [provider]  warfarin (COUMADIN) 6 MG tablet Take 3 mg by mouth as directed.     [provider]    Family History History reviewed. No pertinent family history.  Social History Social History   Tobacco Use  .  Smoking status: Former Games developer  . Smokeless tobacco: Never Used  Substance Use Topics  . Alcohol use: No  . Drug use: Not on file     Allergies   Patient has no known allergies.   Review of Systems Review of Systems  Constitutional: Negative for fever.  HENT: Negative for congestion.   Eyes: Negative for redness.  Respiratory: Positive for shortness of breath.   Cardiovascular: Positive for leg swelling. Negative for chest pain.  Gastrointestinal: Negative for abdominal pain.  Genitourinary: Negative for dysuria.  Musculoskeletal: Negative for joint swelling.  Skin: Negative for wound.  Neurological: Negative for syncope.  Hematological: Bruises/bleeds easily.  Psychiatric/Behavioral: Negative for confusion.     Physical Exam Updated Vital  Signs BP (!) 154/79   Pulse 71   Temp 98.3 F (36.8 C) (Oral)   Resp (!) 21   Ht 1.575 m (5\' 2" )   Wt 86.2 kg   SpO2 95%   BMI 34.75 kg/m   Physical Exam  Constitutional: She is oriented to person, place, and time. She appears well-developed and well-nourished. No distress.  HENT:  Head: Normocephalic and atraumatic.  Mouth/Throat: Oropharynx is clear and moist.  Eyes: Pupils are equal, round, and reactive to light. Conjunctivae and EOM are normal.  Pulmonary/Chest: Effort normal and breath sounds normal. No respiratory distress. She has no wheezes. She has no rales.  Abdominal: Soft. Bowel sounds are normal. She exhibits no distension.  Musculoskeletal: She exhibits edema.  Bilateral leg swelling.  Left greater than right and left superficial varicose veins with left leg.  No increased warmth.  Good cap refill no bony tenderness.  No swelling of the knee.  Good range of motion.  Swelling is greater on the left leg.  Neurological: She is alert and oriented to person, place, and time. No cranial nerve deficit or sensory deficit. She exhibits normal muscle tone. Coordination normal.  Skin: Skin is warm. No rash noted.  Nursing note and vitals reviewed.    ED Treatments / Results  Labs (all labs ordered are listed, but only abnormal results are displayed) Labs Reviewed  CBC - Abnormal; Notable for the following components:      Result Value   RBC 5.14 (*)    All other components within normal limits  BASIC METABOLIC PANEL - Abnormal; Notable for the following components:   CO2 19 (*)    Glucose, Bld 269 (*)    Calcium 8.7 (*)    GFR calc non Af Amer 56 (*)    All other components within normal limits  BRAIN NATRIURETIC PEPTIDE - Abnormal; Notable for the following components:   B Natriuretic Peptide 450.0 (*)    All other components within normal limits  PROTIME-INR - Abnormal; Notable for the following components:   Prothrombin Time 18.6 (*)    All other components within  normal limits    EKG EKG Interpretation  Date/Time:  Saturday February 23 2018 13:14:52 EDT Ventricular Rate:  76 PR Interval:    QRS Duration: 74 QT Interval:  372 QTC Calculation: 418 R Axis:   35 Text Interpretation:  Atrial fibrillation Septal infarct , age undetermined T wave abnormality, consider inferolateral ischemia Abnormal ECG No significant change since last tracing Confirmed by Vanetta Mulders [phone]) on 02/23/2018 1:17:03 PM   Radiology Dg Chest 2 View  Result Date: 02/23/2018 CLINICAL DATA:  Shortness of breath which is worsening EXAM: CHEST - 2 VIEW COMPARISON:  None. FINDINGS: The heart size and mediastinal contours are within  normal limits. Both lungs are clear. The visualized skeletal structures are unremarkable. IMPRESSION: No active cardiopulmonary disease. Electronically Signed   By: Elige KoHetal  Patel   On: 02/23/2018 14:33    Procedures Procedures (including critical care time)  Medications Ordered in ED Medications - No data to display   Initial Impression / Assessment and Plan / ED Course  I have reviewed the triage vital signs and the nursing notes.  Pertinent labs & imaging results that were available during my care of the patient were reviewed by me and considered in my medical decision making (see chart for details).     Patient with shortness of breath but here clinically no acute distress.  Sats in the upper 90s patient is not normally on oxygen this is on room air.  Patient has a history of COPD as well as CHF.  No wheezing noted on exam patient appeared in no significant respiratory distress..  Chest x-ray negative for pulmonary edema or pneumonia.  Patient was swelling to the left leg that there is apparently increased does have evidence of superficial varicose veins there.  Patient has had blood clots in the past and also has an IVC filter.  Patient supposed to be on Coumadin.  INR is not therapeutic.  Doppler studies of the left leg done to rule  out DVT.  More importantly now the patient is not therapeutic with her Coumadin.  If negative for leg swelling probably secondary to varicose veins.  And patient is stable for discharge home.  No evidence of any significant respiratory distress.  No chest pains or troponin was not checked.  BNP is in the 400 range.  However chest x-ray negative for pulmonary edema.  There is nothing  Final Clinical Impressions(s) / ED Diagnoses   Final diagnoses:  SOB (shortness of breath)  Pain and swelling of left lower leg    ED Discharge Orders    None       Vanetta MuldersZackowski, Yuma Pacella, MD 02/23/18 1549   Chest x-ray negative Doppler study was negative for any acute DVT to the left leg.  There is evidence of superficial varicose veins.  However patient's INR is not therapeutic.  Not clear whether she is forgetting to take her medicine or not it sounds as if she supposed to be on it.  We will have family members contact primary care doctor and to assure that she takes the Coumadin as directed.  She may need an increase in her Coumadin.  No evidence of significant CHF no evidence of a significant exacerbation of COPD.  Her room air sats have been in the upper 90s.  No evidence of DVT.  Patient stable for discharge home and close follow-up with primary care doctor.   Vanetta MuldersZackowski, Emilianna Barlowe, MD 02/23/18 [phone]

## 2018-02-23 NOTE — Discharge Instructions (Addendum)
Your Coumadin level today was just 1.5.  It should be between 2 and 3.  He will need to follow-up with your regular doctor for an adjustment of your Coumadin pills.  Or perhaps you have accidentally forgot to take the medication.  Best we can tell you are supposed to be still taking it.  But your regular doctor will know that.  Return for any new or worse symptoms.  Chest x-ray here today was negative ultrasound studies of your left leg showed no new clot.  Would recommend just calling your regular doctor on Monday in regard to the low Coumadin level.

## 2018-02-23 NOTE — ED Triage Notes (Signed)
Patient states that she is SOB that has become progressively worse over the last few months. She also reports that she has swelling to her left leg  - patient reports that she gets more short of breath while laying down.

## 2018-05-12 ENCOUNTER — Emergency Department (HOSPITAL_BASED_OUTPATIENT_CLINIC_OR_DEPARTMENT_OTHER): Payer: Medicare Other

## 2018-05-12 ENCOUNTER — Encounter (HOSPITAL_BASED_OUTPATIENT_CLINIC_OR_DEPARTMENT_OTHER): Payer: Self-pay

## 2018-05-12 ENCOUNTER — Other Ambulatory Visit: Payer: Self-pay

## 2018-05-12 ENCOUNTER — Emergency Department (HOSPITAL_BASED_OUTPATIENT_CLINIC_OR_DEPARTMENT_OTHER)
Admission: EM | Admit: 2018-05-12 | Discharge: 2018-05-13 | Disposition: A | Discharge status: Home / Self Care | Payer: Medicare Other | Attending: Emergency Medicine | Admitting: Emergency Medicine

## 2018-05-12 DIAGNOSIS — J90 Pleural effusion, not elsewhere classified: Secondary | ICD-10-CM | POA: Insufficient documentation

## 2018-05-12 DIAGNOSIS — J449 Chronic obstructive pulmonary disease, unspecified: Secondary | ICD-10-CM | POA: Diagnosis not present

## 2018-05-12 DIAGNOSIS — Z7982 Long term (current) use of aspirin: Secondary | ICD-10-CM | POA: Insufficient documentation

## 2018-05-12 DIAGNOSIS — R0789 Other chest pain: Secondary | ICD-10-CM

## 2018-05-12 DIAGNOSIS — I110 Hypertensive heart disease with heart failure: Secondary | ICD-10-CM | POA: Diagnosis not present

## 2018-05-12 DIAGNOSIS — Z7901 Long term (current) use of anticoagulants: Secondary | ICD-10-CM | POA: Diagnosis not present

## 2018-05-12 DIAGNOSIS — E119 Type 2 diabetes mellitus without complications: Secondary | ICD-10-CM | POA: Diagnosis not present

## 2018-05-12 DIAGNOSIS — R0602 Shortness of breath: Secondary | ICD-10-CM | POA: Diagnosis present

## 2018-05-12 DIAGNOSIS — I509 Heart failure, unspecified: Secondary | ICD-10-CM | POA: Insufficient documentation

## 2018-05-12 DIAGNOSIS — Z87891 Personal history of nicotine dependence: Secondary | ICD-10-CM | POA: Diagnosis not present

## 2018-05-12 DIAGNOSIS — Z79899 Other long term (current) drug therapy: Secondary | ICD-10-CM | POA: Diagnosis not present

## 2018-05-12 DIAGNOSIS — M19011 Primary osteoarthritis, right shoulder: Secondary | ICD-10-CM | POA: Diagnosis not present

## 2018-05-12 LAB — CBC WITH DIFFERENTIAL/PLATELET
Abs Immature Granulocytes: 0.02 10*3/uL (ref 0.00–0.07)
BASOS ABS: 0 10*3/uL (ref 0.0–0.1)
BASOS PCT: 0 %
EOS ABS: 0 10*3/uL (ref 0.0–0.5)
EOS PCT: 0 %
HCT: 42 % (ref 36.0–46.0)
Hemoglobin: 13.7 g/dL (ref 12.0–15.0)
Immature Granulocytes: 0 %
Lymphocytes Relative: 23 %
Lymphs Abs: 1.5 10*3/uL (ref 0.7–4.0)
MCH: 28 pg (ref 26.0–34.0)
MCHC: 32.6 g/dL (ref 30.0–36.0)
MCV: 85.7 fL (ref 80.0–100.0)
MONO ABS: 0.8 10*3/uL (ref 0.1–1.0)
Monocytes Relative: 11 %
NEUTROS PCT: 66 %
Neutro Abs: 4.4 10*3/uL (ref 1.7–7.7)
Platelets: 191 10*3/uL (ref 150–400)
RBC: 4.9 MIL/uL (ref 3.87–5.11)
RDW: 15.8 % — AB (ref 11.5–15.5)
WBC: 6.7 10*3/uL (ref 4.0–10.5)
nRBC: 0 % (ref 0.0–0.2)

## 2018-05-12 LAB — COMPREHENSIVE METABOLIC PANEL
ALBUMIN: 3.8 g/dL (ref 3.5–5.0)
ALK PHOS: 88 U/L (ref 38–126)
ALT: 12 U/L (ref 0–44)
ANION GAP: 12 (ref 5–15)
AST: 24 U/L (ref 15–41)
BUN: 9 mg/dL (ref 8–23)
CALCIUM: 9.3 mg/dL (ref 8.9–10.3)
CO2: 22 mmol/L (ref 22–32)
Chloride: 106 mmol/L (ref 98–111)
Creatinine, Ser: 0.81 mg/dL (ref 0.44–1.00)
GFR calc non Af Amer: >60 mL/min (ref >60)
GLUCOSE: 147 mg/dL — AB (ref 70–99)
POTASSIUM: 3.9 mmol/L (ref 3.5–5.1)
SODIUM: 140 mmol/L (ref 135–145)
TOTAL PROTEIN: 7.5 g/dL (ref 6.5–8.1)
Total Bilirubin: 1.4 mg/dL — ABNORMAL HIGH (ref 0.3–1.2)

## 2018-05-12 LAB — PROTIME-INR
INR: 2.95
PROTHROMBIN TIME: 30.3 s — AB (ref 11.4–15.2)

## 2018-05-12 LAB — BRAIN NATRIURETIC PEPTIDE: B NATRIURETIC PEPTIDE 5: 512.9 pg/mL — AB (ref 0.0–100.0)

## 2018-05-12 LAB — TROPONIN I: Troponin I: <0.03 ng/mL (ref <0.03)

## 2018-05-12 NOTE — ED Provider Notes (Signed)
[facility] EMERGENCY DEPARTMENT Provider Note   CSN: [id number] Arrival date & time: 05/12/18  1939     History   Chief Complaint Chief Complaint  Patient presents with  . Shortness of Breath  . Shoulder Pain    HPI Jill Crane is a 77 y.o. female.  HPI Patient presents with 3-week history of progressive shortness of breath especially with lying flat and exertion.  Denies cough, fever or chills.  She also complains of right shoulder pain which is been present for the past 3 weeks as well.  No known injury.  No focal weakness or numbness.  Patient states that since she has been in the emergency department she developed some left-sided chest pain.  Does not radiate.  Has ongoing lower extremity swelling.  Continues to take Lasix.  Denies missing any doses. Past Medical History:  Diagnosis Date  . Atrial fibrillation with normal ventricular rate (HCC)   . CHF (congestive heart failure) (HCC)   . COPD (chronic obstructive pulmonary disease) (HCC)   . Diabetes mellitus without complication (HCC)   . Hx of blood clots   . Hypertension     There are no active problems to display for this patient.   History reviewed. No pertinent surgical history.   OB History   None      Home Medications    Prior to Admission medications   Medication Sig Start Date End Date Taking? Authorizing Provider  albuterol (PROVENTIL HFA;VENTOLIN HFA) 108 (90 BASE) MCG/ACT inhaler Inhale 2 puffs into the lungs every 6 (six) hours as needed for wheezing or shortness of breath.    [provider]  alendronate (FOSAMAX) 35 MG tablet Take 35 mg by mouth every 7 (seven) days. Take with a full glass of water on an empty stomach.    [provider]  aspirin 81 MG chewable tablet Chew 81 mg by mouth daily.    [provider]  busPIRone (BUSPAR) 5 MG tablet Take 5 mg by mouth 3 (three) times daily.    [provider]  calcium-vitamin D (OSCAL WITH D) 500-200  MG-UNIT per tablet Take 1 tablet by mouth.    [provider]  DILTIAZEM HCL PO Take 240 mg by mouth. Pt is not sure if she is still on this med. Does not have an updated med list    [provider]  furosemide (LASIX) 20 MG tablet Take 40 mg (2 tablets) tomorrow and the day after, then continue with 20 mg daily 04/15/17   Harris, Cammy Copa, PA-C  ipratropium-albuterol (DUONEB) 0.5-2.5 (3) MG/3ML SOLN Take 3 mLs by nebulization.    [provider]  metFORMIN (GLUCOPHAGE) 500 MG tablet Take by mouth 2 (two) times daily with a meal.    [provider]  metoprolol succinate (TOPROL-XL) 25 MG 24 hr tablet Take 25 mg by mouth daily.    [provider]  metoprolol tartrate (LOPRESSOR) 25 MG tablet Take 25 mg by mouth 2 (two) times daily.    [provider]  omeprazole (PRILOSEC) 20 MG capsule Take 20 mg by mouth daily.    [provider]  predniSONE (DELTASONE) 10 MG tablet Take 5 mg by mouth daily with breakfast.    [provider]  warfarin (COUMADIN) 6 MG tablet Take 3 mg by mouth as directed.     [provider]    Family History No family history on file.  Social History Social History   Tobacco Use  . Smoking  status: Former Games developer  . Smokeless tobacco: Never Used  Substance Use Topics  . Alcohol use: No  . Drug use: Not on file     Allergies   Patient has no known allergies.   Review of Systems Review of Systems  Constitutional: Negative for chills and fever.  HENT: Negative for sinus pressure and trouble swallowing.   Eyes: Negative for visual disturbance.  Respiratory: Positive for shortness of breath. Negative for cough.   Cardiovascular: Positive for chest pain and leg swelling.  Gastrointestinal: Negative for abdominal pain, constipation, diarrhea, nausea and vomiting.  Musculoskeletal: Positive for arthralgias and myalgias. Negative for neck pain.  Skin: Negative for rash and wound.    Neurological: Negative for dizziness, weakness, light-headedness, numbness and headaches.  All other systems reviewed and are negative.    Physical Exam Updated Vital Signs BP 135/84   Pulse 75   Temp 98.5 F (36.9 C) (Oral)   Resp 20   Ht 5\' 3"  (1.6 m)   Wt 81.6 kg   SpO2 98%   BMI 31.89 kg/m   Physical Exam  Constitutional: She is oriented to person, place, and time. She appears well-developed and well-nourished. No distress.  HENT:  Head: Normocephalic and atraumatic.  Mouth/Throat: Oropharynx is clear and moist. No oropharyngeal exudate.  Eyes: Pupils are equal, round, and reactive to light. EOM are normal.  Neck: Normal range of motion. Neck supple. JVD present.  No posterior midline cervical tenderness to palpation.  No meningismus.  Cardiovascular: Normal rate.  Irregularly irregular  Pulmonary/Chest: Effort normal.  Diminished breath sounds throughout especially in bilateral bases.  Few scattered crackles.  Abdominal: Soft. Bowel sounds are normal. There is no tenderness. There is no rebound and no guarding.  Musculoskeletal: Normal range of motion. She exhibits tenderness. She exhibits no edema.  Tenderness with palpation of the right trapezius and right deltoid.  Full range of motion of the right shoulder without obvious deformity.  No effusions.  No warmth.  Distal pulses are 2+.  2+ edema bilateral lower extremities.  Lymphadenopathy:    She has no cervical adenopathy.  Neurological: She is alert and oriented to person, place, and time.  Moving all extremities without focal deficit.  Sensation intact.  Skin: Skin is warm and dry. Capillary refill takes less than 2 seconds. No rash noted. She is not diaphoretic. No erythema.  Psychiatric: She has a normal mood and affect. Her behavior is normal.  Nursing note and vitals reviewed.    ED Treatments / Results  Labs (all labs ordered are listed, but only abnormal results are displayed) Labs Reviewed  BRAIN  NATRIURETIC PEPTIDE - Abnormal; Notable for the following components:      Result Value   B Natriuretic Peptide 512.9 (*)    All other components within normal limits  CBC WITH DIFFERENTIAL/PLATELET - Abnormal; Notable for the following components:   RDW 15.8 (*)    All other components within normal limits  COMPREHENSIVE METABOLIC PANEL - Abnormal; Notable for the following components:   Glucose, Bld 147 (*)    Total Bilirubin 1.4 (*)    All other components within normal limits  PROTIME-INR - Abnormal; Notable for the following components:   Prothrombin Time 30.3 (*)    All other components within normal limits  TROPONIN I  TROPONIN I    EKG EKG Interpretation  Date/Time:  Sunday May 12 2018 20:39:06 EDT Ventricular Rate:  94 PR Interval:    QRS Duration: 114 QT Interval:  376 QTC Calculation: 448 R Axis:   26 Text Interpretation:  Atrial fibrillation Probable left ventricular hypertrophy Anterior Q waves, possibly due to LVH Confirmed by Loren Racer ([id number]) on 05/12/2018 11:55:07 PM   Radiology Dg Chest 2 View  Result Date: 05/12/2018 CLINICAL DATA:  Right shoulder pain 3 weeks with worsening shortness of breath. EXAM: CHEST - 2 VIEW COMPARISON:  03/15/2018 FINDINGS: Lungs are adequately inflated with interval development of a small right pleural effusion likely with associated basilar atelectasis. Mild stable cardiomegaly. Remainder of the exam is unchanged. IMPRESSION: Interval development of small right pleural effusion likely with associated basilar atelectasis. Stable cardiomegaly. Electronically Signed   By: Elberta Fortis M.D.   On: 05/12/2018 20:53   Dg Shoulder Right  Result Date: 05/12/2018 CLINICAL DATA:  Right shoulder pain 3 weeks.  Pain worse at night. EXAM: RIGHT SHOULDER - 2+ VIEW COMPARISON:  Chest x-ray 02/23/2018 FINDINGS: Moderate degenerative change of the [facility] joint and mild degenerate change of the glenohumeral joint. No evidence of acute  fracture or dislocation. IMPRESSION: No acute findings. Degenerative change of the [facility] joint and glenohumeral joints. Electronically Signed   By: Elberta Fortis M.D.   On: 05/12/2018 20:55    Procedures Procedures (including critical care time)  Medications Ordered in ED Medications - No data to display   Initial Impression / Assessment and Plan / ED Course  I have reviewed the triage vital signs and the nursing notes.  Pertinent labs & imaging results that were available during my care of the patient were reviewed by me and considered in my medical decision making (see chart for details).     Small pleural effusion on the right.  BNP is mildly elevated.  INR is therapeutic.  Troponin x2 is normal.  We will have patient increase her Lasix dose for the next few days.  She is been encouraged to follow-up closely with her primary physician and return precautions have been given.  Final Clinical Impressions(s) / ED Diagnoses   Final diagnoses:  Pleural effusion  Arthritis of right acromioclavicular joint  Atypical chest pain    ED Discharge Orders    None       Loren Racer, MD 05/12/18 [id number]

## 2018-05-12 NOTE — ED Triage Notes (Signed)
Pt reports right shoulder pain x 3 weeks. Also reports worsening shortness of breath x 3 weeks as well. States the shortness of breath is worse at night.

## 2018-05-12 NOTE — ED Notes (Addendum)
Delay in EKG. Pt attempting urine sample. X-ray also waiting to take pt.

## 2018-05-12 NOTE — Discharge Instructions (Signed)
Take 40 mg of your lasix for the next 3 days. Follow up with your primary MD for re-evaluation. Return immediately for worsening shortness of breath or chest pain

## 2018-07-12 ENCOUNTER — Emergency Department (HOSPITAL_BASED_OUTPATIENT_CLINIC_OR_DEPARTMENT_OTHER)
Admission: EM | Admit: 2018-07-12 | Discharge: 2018-07-12 | Disposition: A | Discharge status: Home / Self Care | Payer: Medicare Other | Attending: Emergency Medicine | Admitting: Emergency Medicine

## 2018-07-12 ENCOUNTER — Other Ambulatory Visit: Payer: Self-pay

## 2018-07-12 ENCOUNTER — Encounter (HOSPITAL_BASED_OUTPATIENT_CLINIC_OR_DEPARTMENT_OTHER): Payer: Self-pay | Admitting: *Deleted

## 2018-07-12 DIAGNOSIS — Z87891 Personal history of nicotine dependence: Secondary | ICD-10-CM | POA: Insufficient documentation

## 2018-07-12 DIAGNOSIS — I110 Hypertensive heart disease with heart failure: Secondary | ICD-10-CM | POA: Diagnosis not present

## 2018-07-12 DIAGNOSIS — Z7982 Long term (current) use of aspirin: Secondary | ICD-10-CM | POA: Diagnosis not present

## 2018-07-12 DIAGNOSIS — Z79899 Other long term (current) drug therapy: Secondary | ICD-10-CM | POA: Diagnosis not present

## 2018-07-12 DIAGNOSIS — I509 Heart failure, unspecified: Secondary | ICD-10-CM | POA: Diagnosis not present

## 2018-07-12 DIAGNOSIS — Z7901 Long term (current) use of anticoagulants: Secondary | ICD-10-CM | POA: Diagnosis not present

## 2018-07-12 DIAGNOSIS — Z7984 Long term (current) use of oral hypoglycemic drugs: Secondary | ICD-10-CM | POA: Insufficient documentation

## 2018-07-12 DIAGNOSIS — R42 Dizziness and giddiness: Secondary | ICD-10-CM | POA: Diagnosis not present

## 2018-07-12 DIAGNOSIS — E119 Type 2 diabetes mellitus without complications: Secondary | ICD-10-CM | POA: Diagnosis not present

## 2018-07-12 LAB — BASIC METABOLIC PANEL
Anion gap: 9 (ref 5–15)
BUN: 14 mg/dL (ref 8–23)
CO2: 17 mmol/L — ABNORMAL LOW (ref 22–32)
Calcium: 9 mg/dL (ref 8.9–10.3)
Chloride: 109 mmol/L (ref 98–111)
Creatinine, Ser: 0.91 mg/dL (ref 0.44–1.00)
GFR calc Af Amer: >60 mL/min (ref >60)
GFR calc non Af Amer: >60 mL/min (ref >60)
Glucose, Bld: 162 mg/dL — ABNORMAL HIGH (ref 70–99)
Potassium: 3.8 mmol/L (ref 3.5–5.1)
Sodium: 135 mmol/L (ref 135–145)

## 2018-07-12 LAB — CBC WITH DIFFERENTIAL/PLATELET
Abs Immature Granulocytes: 0.02 10*3/uL (ref 0.00–0.07)
Basophils Absolute: 0.1 10*3/uL (ref 0.0–0.1)
Basophils Relative: 1 %
Eosinophils Absolute: 0.1 10*3/uL (ref 0.0–0.5)
Eosinophils Relative: 1 %
HCT: 43 % (ref 36.0–46.0)
Hemoglobin: 13.3 g/dL (ref 12.0–15.0)
Immature Granulocytes: 0 %
Lymphocytes Relative: 29 %
Lymphs Abs: 1.7 10*3/uL (ref 0.7–4.0)
MCH: 27.5 pg (ref 26.0–34.0)
MCHC: 30.9 g/dL (ref 30.0–36.0)
MCV: 88.8 fL (ref 80.0–100.0)
Monocytes Absolute: 0.6 10*3/uL (ref 0.1–1.0)
Monocytes Relative: 10 %
Neutro Abs: 3.4 10*3/uL (ref 1.7–7.7)
Neutrophils Relative %: 59 %
Platelets: 197 10*3/uL (ref 150–400)
RBC: 4.84 MIL/uL (ref 3.87–5.11)
RDW: 16.4 % — ABNORMAL HIGH (ref 11.5–15.5)
WBC: 5.8 10*3/uL (ref 4.0–10.5)
nRBC: 0 % (ref 0.0–0.2)

## 2018-07-12 LAB — URINALYSIS, ROUTINE W REFLEX MICROSCOPIC
Bilirubin Urine: NEGATIVE
Glucose, UA: NEGATIVE mg/dL
Ketones, ur: NEGATIVE mg/dL
Leukocytes, UA: NEGATIVE
Nitrite: NEGATIVE
Protein, ur: NEGATIVE mg/dL
Specific Gravity, Urine: <1.005 — ABNORMAL LOW (ref 1.005–1.030)
pH: 6 (ref 5.0–8.0)

## 2018-07-12 LAB — PROTIME-INR
INR: 3.12
Prothrombin Time: 31.6 seconds — ABNORMAL HIGH (ref 11.4–15.2)

## 2018-07-12 LAB — URINALYSIS, MICROSCOPIC (REFLEX): WBC, UA: NONE SEEN WBC/hpf (ref 0–5)

## 2018-07-12 NOTE — ED Triage Notes (Signed)
Dizziness x 2 hours after waking from a nap. Hx of dizziness.

## 2018-07-12 NOTE — ED Notes (Signed)
ED Provider at bedside. 

## 2018-07-12 NOTE — ED Notes (Signed)
Pt ambulated in department with steady gate. Reports decrease in dizziness.

## 2018-07-12 NOTE — ED Provider Notes (Signed)
[facility] EMERGENCY DEPARTMENT Provider Note   CSN: [id number] Arrival date & time: 07/12/18  1823     History   Chief Complaint Chief Complaint  Patient presents with  . Dizziness    HPI Jill MeresSarah Crane is a 77 y.o. female.  HPI   77 year old female with dizziness.  Onset after awakening from a nap earlier today.  She went to sleep in her usual state of health.  She describes both days and sensation of the room spinning and also feeling lightheaded.  Feels better at rest.  No tenderness.  No acute pain.  No acute respiratory complaints.  No acute neurologic complaints otherwise.  Past Medical History:  Diagnosis Date  . Atrial fibrillation with normal ventricular rate (HCC)   . CHF (congestive heart failure) (HCC)   . COPD (chronic obstructive pulmonary disease) (HCC)   . Diabetes mellitus without complication (HCC)   . Hx of blood clots   . Hypertension     There are no active problems to display for this patient.   History reviewed. No pertinent surgical history.   OB History   No obstetric history on file.      Home Medications    Prior to Admission medications   Medication Sig Start Date End Date Taking? Authorizing Provider  albuterol (PROVENTIL HFA;VENTOLIN HFA) 108 (90 BASE) MCG/ACT inhaler Inhale 2 puffs into the lungs every 6 (six) hours as needed for wheezing or shortness of breath.    [provider]  alendronate (FOSAMAX) 35 MG tablet Take 35 mg by mouth every 7 (seven) days. Take with a full glass of water on an empty stomach.    [provider]  aspirin 81 MG chewable tablet Chew 81 mg by mouth daily.    [provider]  busPIRone (BUSPAR) 5 MG tablet Take 5 mg by mouth 3 (three) times daily.    [provider]  calcium-vitamin D (OSCAL WITH D) 500-200 MG-UNIT per tablet Take 1 tablet by mouth.    [provider]  DILTIAZEM HCL PO Take 240 mg by mouth. Pt is not sure if she is still on this med.  Does not have an updated med list    [provider]  furosemide (LASIX) 20 MG tablet Take 40 mg (2 tablets) tomorrow and the day after, then continue with 20 mg daily 04/15/17   Harris, Cammy CopaAbigail, PA-C  ipratropium-albuterol (DUONEB) 0.5-2.5 (3) MG/3ML SOLN Take 3 mLs by nebulization.    [provider]  metFORMIN (GLUCOPHAGE) 500 MG tablet Take by mouth 2 (two) times daily with a meal.    [provider]  metoprolol succinate (TOPROL-XL) 25 MG 24 hr tablet Take 25 mg by mouth daily.    [provider]  metoprolol tartrate (LOPRESSOR) 25 MG tablet Take 25 mg by mouth 2 (two) times daily.    [provider]  omeprazole (PRILOSEC) 20 MG capsule Take 20 mg by mouth daily.    [provider]  predniSONE (DELTASONE) 10 MG tablet Take 5 mg by mouth daily with breakfast.    [provider]  warfarin (COUMADIN) 6 MG tablet Take 3 mg by mouth as directed.     [provider]    Family History No family history on file.  Social History Social History   Tobacco Use  . Smoking status: Former Games developermoker  . Smokeless tobacco: Never Used  Substance Use Topics  . Alcohol use: No  . Drug use: Not on file  Allergies   Patient has no known allergies.   Review of Systems Review of Systems  All systems reviewed and negative, other than as noted in HPI.  Physical Exam Updated Vital Signs BP (!) 158/81   Pulse 78 Comment: irregular  Temp 98.4 F (36.9 C) (Oral)   Resp 20   Ht 5\' 4"  (1.626 m)   Wt 83.9 kg   SpO2 100%   BMI 31.76 kg/m   Physical Exam Vitals signs and nursing note reviewed.  Constitutional:      General: She is not in acute distress.    Appearance: She is well-developed.  HENT:     Head: Normocephalic and atraumatic.  Eyes:     General:        Right eye: No discharge.        Left eye: No discharge.     Conjunctiva/sclera: Conjunctivae normal.  Neck:     Musculoskeletal: Neck supple.    Cardiovascular:     Rate and Rhythm: Normal rate and regular rhythm.     Heart sounds: Normal heart sounds. No murmur. No friction rub. No gallop.   Pulmonary:     Effort: Pulmonary effort is normal. No respiratory distress.     Breath sounds: Normal breath sounds.  Abdominal:     General: There is no distension.     Palpations: Abdomen is soft.     Tenderness: There is no abdominal tenderness.  Musculoskeletal:        General: No tenderness.  Skin:    General: Skin is warm and dry.  Neurological:     Mental Status: She is alert.  Psychiatric:        Behavior: Behavior normal.        Thought Content: Thought content normal.      ED Treatments / Results  Labs (all labs ordered are listed, but only abnormal results are displayed) Labs Reviewed - No data to display  EKG EKG Interpretation  Date/Time:  Friday July 12 2018 18:36:29 EST Ventricular Rate:  96 PR Interval:    QRS Duration: 86 QT Interval:  346 QTC Calculation: 438 R Axis:     Text Interpretation:  Atrial fibrillation Confirmed by Raeford RazorKohut, Dartagnan Beavers [phone] on 07/12/2018 7:11:54 PM   Radiology No results found.  Procedures Procedures (including critical care time)  Medications Ordered in ED Medications - No data to display   Initial Impression / Assessment and Plan / ED Course  I have reviewed the triage vital signs and the nursing notes.  Pertinent labs & imaging results that were available during my care of the patient were reviewed by me and considered in my medical decision making (see chart for details).      Final Clinical Impressions(s) / ED Diagnoses   Final diagnoses:  Dizziness    ED Discharge Orders    None       Raeford RazorKohut, Breyton Vanscyoc, MD 07/21/18 [phone]

## 2018-08-23 ENCOUNTER — Other Ambulatory Visit: Payer: Self-pay

## 2018-08-23 ENCOUNTER — Emergency Department (HOSPITAL_BASED_OUTPATIENT_CLINIC_OR_DEPARTMENT_OTHER)
Admission: EM | Admit: 2018-08-23 | Discharge: 2018-08-23 | Disposition: A | Discharge status: Home / Self Care | Payer: Medicare Other | Attending: Emergency Medicine | Admitting: Emergency Medicine

## 2018-08-23 ENCOUNTER — Emergency Department (HOSPITAL_BASED_OUTPATIENT_CLINIC_OR_DEPARTMENT_OTHER): Payer: Medicare Other

## 2018-08-23 ENCOUNTER — Encounter (HOSPITAL_BASED_OUTPATIENT_CLINIC_OR_DEPARTMENT_OTHER): Payer: Self-pay | Admitting: *Deleted

## 2018-08-23 DIAGNOSIS — Z7901 Long term (current) use of anticoagulants: Secondary | ICD-10-CM | POA: Insufficient documentation

## 2018-08-23 DIAGNOSIS — R51 Headache: Secondary | ICD-10-CM | POA: Diagnosis present

## 2018-08-23 DIAGNOSIS — Z7982 Long term (current) use of aspirin: Secondary | ICD-10-CM | POA: Insufficient documentation

## 2018-08-23 DIAGNOSIS — J449 Chronic obstructive pulmonary disease, unspecified: Secondary | ICD-10-CM | POA: Diagnosis not present

## 2018-08-23 DIAGNOSIS — I110 Hypertensive heart disease with heart failure: Secondary | ICD-10-CM | POA: Diagnosis not present

## 2018-08-23 DIAGNOSIS — Z87891 Personal history of nicotine dependence: Secondary | ICD-10-CM | POA: Diagnosis not present

## 2018-08-23 DIAGNOSIS — Z7984 Long term (current) use of oral hypoglycemic drugs: Secondary | ICD-10-CM | POA: Insufficient documentation

## 2018-08-23 DIAGNOSIS — B349 Viral infection, unspecified: Secondary | ICD-10-CM | POA: Diagnosis not present

## 2018-08-23 DIAGNOSIS — E119 Type 2 diabetes mellitus without complications: Secondary | ICD-10-CM | POA: Diagnosis not present

## 2018-08-23 DIAGNOSIS — I509 Heart failure, unspecified: Secondary | ICD-10-CM | POA: Insufficient documentation

## 2018-08-23 LAB — COMPREHENSIVE METABOLIC PANEL
ALK PHOS: 107 U/L (ref 38–126)
ALT: 9 U/L (ref 0–44)
AST: 15 U/L (ref 15–41)
Albumin: 3 g/dL — ABNORMAL LOW (ref 3.5–5.0)
Anion gap: 9 (ref 5–15)
BUN: 10 mg/dL (ref 8–23)
CO2: 24 mmol/L (ref 22–32)
Calcium: 8.3 mg/dL — ABNORMAL LOW (ref 8.9–10.3)
Chloride: 103 mmol/L (ref 98–111)
Creatinine, Ser: 0.88 mg/dL (ref 0.44–1.00)
GFR calc Af Amer: >60 mL/min (ref >60)
GFR calc non Af Amer: >60 mL/min (ref >60)
Glucose, Bld: 178 mg/dL — ABNORMAL HIGH (ref 70–99)
Potassium: 3.1 mmol/L — ABNORMAL LOW (ref 3.5–5.1)
Sodium: 136 mmol/L (ref 135–145)
Total Bilirubin: 1.2 mg/dL (ref 0.3–1.2)
Total Protein: 6.7 g/dL (ref 6.5–8.1)

## 2018-08-23 LAB — URINALYSIS, ROUTINE W REFLEX MICROSCOPIC
Bilirubin Urine: NEGATIVE
Glucose, UA: NEGATIVE mg/dL
Hgb urine dipstick: NEGATIVE
KETONES UR: NEGATIVE mg/dL
Nitrite: NEGATIVE
PH: 6 (ref 5.0–8.0)
Protein, ur: NEGATIVE mg/dL
Specific Gravity, Urine: 1.01 (ref 1.005–1.030)

## 2018-08-23 LAB — URINALYSIS, MICROSCOPIC (REFLEX): RBC / HPF: NONE SEEN RBC/hpf (ref 0–5)

## 2018-08-23 LAB — CBC WITH DIFFERENTIAL/PLATELET
ABS IMMATURE GRANULOCYTES: 0.05 10*3/uL (ref 0.00–0.07)
Basophils Absolute: 0 10*3/uL (ref 0.0–0.1)
Basophils Relative: 0 %
Eosinophils Absolute: 0 10*3/uL (ref 0.0–0.5)
Eosinophils Relative: 0 %
HCT: 39.8 % (ref 36.0–46.0)
Hemoglobin: 12.8 g/dL (ref 12.0–15.0)
Immature Granulocytes: 0 %
Lymphocytes Relative: 16 %
Lymphs Abs: 1.8 10*3/uL (ref 0.7–4.0)
MCH: 27.1 pg (ref 26.0–34.0)
MCHC: 32.2 g/dL (ref 30.0–36.0)
MCV: 84.3 fL (ref 80.0–100.0)
Monocytes Absolute: 1.2 10*3/uL — ABNORMAL HIGH (ref 0.1–1.0)
Monocytes Relative: 10 %
NEUTROS ABS: 8.5 10*3/uL — AB (ref 1.7–7.7)
Neutrophils Relative %: 74 %
Platelets: 204 10*3/uL (ref 150–400)
RBC: 4.72 MIL/uL (ref 3.87–5.11)
RDW: 15.4 % (ref 11.5–15.5)
WBC: 11.5 10*3/uL — ABNORMAL HIGH (ref 4.0–10.5)
nRBC: 0 % (ref 0.0–0.2)

## 2018-08-23 LAB — BRAIN NATRIURETIC PEPTIDE: B Natriuretic Peptide: 552.5 pg/mL — ABNORMAL HIGH (ref 0.0–100.0)

## 2018-08-23 LAB — PROTIME-INR
INR: 1.34
PROTHROMBIN TIME: 16.4 s — AB (ref 11.4–15.2)

## 2018-08-23 LAB — TROPONIN I: Troponin I: <0.03 ng/mL (ref <0.03)

## 2018-08-23 MED ORDER — ACETAMINOPHEN 325 MG PO TABS
650.0000 mg | ORAL_TABLET | Freq: Once | ORAL | Status: AC
  Administered 2018-08-23: 650 mg via ORAL
  Filled 2018-08-23: qty 2

## 2018-08-23 NOTE — ED Notes (Signed)
ED Provider at bedside. 

## 2018-08-23 NOTE — ED Triage Notes (Signed)
Headache and general body aches x 5 days. Denies fever or chills.

## 2018-08-23 NOTE — ED Provider Notes (Signed)
[facility] EMERGENCY DEPARTMENT Provider Note   CSN: [id number] Arrival date & time: 08/23/18  1351     History   Chief Complaint Chief Complaint  Patient presents with  . Headache  . Generalized Body Aches    HPI Jill Crane is a 78 y.o. female.  78 y.o female with a PMH of CHF, Afib, DM, COPD presents to the ED with a chief complaint of chest pain, soreness and headache x 5 days. Patient also endorses some soreness and pain along the back of her head. Patient states she has taken tylenol for her symptoms but states no relieve in symptoms. She is a very poor historian during interview and reports she is "I am just sore all over". She denies any nausea, vomiting, diarrhea or shortness of breath.      Past Medical History:  Diagnosis Date  . Atrial fibrillation with normal ventricular rate (HCC)   . CHF (congestive heart failure) (HCC)   . COPD (chronic obstructive pulmonary disease) (HCC)   . Diabetes mellitus without complication (HCC)   . Hx of blood clots   . Hypertension     There are no active problems to display for this patient.   History reviewed. No pertinent surgical history.   OB History   No obstetric history on file.      Home Medications    Prior to Admission medications   Medication Sig Start Date End Date Taking? Authorizing Provider  albuterol (PROVENTIL HFA;VENTOLIN HFA) 108 (90 BASE) MCG/ACT inhaler Inhale 2 puffs into the lungs every 6 (six) hours as needed for wheezing or shortness of breath.    [provider]  alendronate (FOSAMAX) 35 MG tablet Take 35 mg by mouth every 7 (seven) days. Take with a full glass of water on an empty stomach.    [provider]  aspirin 81 MG chewable tablet Chew 81 mg by mouth daily.    [provider]  busPIRone (BUSPAR) 5 MG tablet Take 5 mg by mouth 3 (three) times daily.    [provider]  calcium-vitamin D (OSCAL WITH D) 500-200 MG-UNIT per tablet Take 1  tablet by mouth.    [provider]  DILTIAZEM HCL PO Take 240 mg by mouth. Pt is not sure if she is still on this med. Does not have an updated med list    [provider]  furosemide (LASIX) 20 MG tablet Take 40 mg (2 tablets) tomorrow and the day after, then continue with 20 mg daily 04/15/17   Harris, Cammy Copa, PA-C  ipratropium-albuterol (DUONEB) 0.5-2.5 (3) MG/3ML SOLN Take 3 mLs by nebulization.    [provider]  metFORMIN (GLUCOPHAGE) 500 MG tablet Take by mouth 2 (two) times daily with a meal.    [provider]  metoprolol succinate (TOPROL-XL) 25 MG 24 hr tablet Take 25 mg by mouth daily.    [provider]  metoprolol tartrate (LOPRESSOR) 25 MG tablet Take 25 mg by mouth 2 (two) times daily.    [provider]  omeprazole (PRILOSEC) 20 MG capsule Take 20 mg by mouth daily.    [provider]  predniSONE (DELTASONE) 10 MG tablet Take 5 mg by mouth daily with breakfast.    [provider]  warfarin (COUMADIN) 6 MG tablet Take 3 mg by mouth as directed.     [provider]    Family History No family history on file.  Social History Social History   Tobacco Use  .  Smoking status: Former Games developermoker  . Smokeless tobacco: Never Used  Substance Use Topics  . Alcohol use: No  . Drug use: Not on file     Allergies   Patient has no known allergies.   Review of Systems Review of Systems  Constitutional: Positive for fever. Negative for chills.  HENT: Negative for ear pain and sore throat.   Eyes: Negative for pain and visual disturbance.  Respiratory: Negative for cough and shortness of breath.   Cardiovascular: Positive for chest pain. Negative for palpitations.  Gastrointestinal: Positive for abdominal pain and nausea. Negative for vomiting.  Genitourinary: Negative for dysuria and hematuria.  Musculoskeletal: Negative for arthralgias and back pain.  Skin: Negative for color change and rash.    Neurological: Positive for headaches. Negative for seizures and syncope.  All other systems reviewed and are negative.    Physical Exam Updated Vital Signs BP 124/68 (BP Location: Left Arm)   Pulse 97   Temp 98.4 F (36.9 C) (Oral)   Resp 12   Ht 5\' 3"  (1.6 m)   Wt 77.1 kg   SpO2 100%   BMI 30.11 kg/m   Physical Exam Vitals signs and nursing note reviewed.  Constitutional:      General: She is not in acute distress.    Appearance: She is well-developed. She is not ill-appearing.  HENT:     Head: Normocephalic and atraumatic.     Mouth/Throat:     Pharynx: No oropharyngeal exudate.  Eyes:     Pupils: Pupils are equal, round, and reactive to light.  Neck:     Musculoskeletal: Normal range of motion.  Cardiovascular:     Rate and Rhythm: Regular rhythm.     Heart sounds: Normal heart sounds.  Pulmonary:     Effort: Pulmonary effort is normal. No respiratory distress.     Breath sounds: Normal breath sounds.  Abdominal:     General: Bowel sounds are normal. There is no distension.     Palpations: Abdomen is soft.     Tenderness: There is no abdominal tenderness.  Musculoskeletal:        General: No tenderness or deformity.     Right lower leg: No edema.     Left lower leg: No edema.  Skin:    General: Skin is warm and dry.  Neurological:     Mental Status: She is alert and oriented to person, place, and time.  Psychiatric:        Mood and Affect: Mood normal.      ED Treatments / Results  Labs (all labs ordered are listed, but only abnormal results are displayed) Labs Reviewed  CBC WITH DIFFERENTIAL/PLATELET - Abnormal; Notable for the following components:      Result Value   WBC 11.5 (*)    Neutro Abs 8.5 (*)    Monocytes Absolute 1.2 (*)    All other components within normal limits  COMPREHENSIVE METABOLIC PANEL - Abnormal; Notable for the following components:   Potassium 3.1 (*)    Glucose, Bld 178 (*)    Calcium 8.3 (*)    Albumin 3.0 (*)     All other components within normal limits  URINALYSIS, ROUTINE W REFLEX MICROSCOPIC - Abnormal; Notable for the following components:   Leukocytes, UA TRACE (*)    All other components within normal limits  BRAIN NATRIURETIC PEPTIDE - Abnormal; Notable for the following components:   B Natriuretic Peptide 552.5 (*)    All other components within normal  limits  PROTIME-INR - Abnormal; Notable for the following components:   Prothrombin Time 16.4 (*)    All other components within normal limits  URINALYSIS, MICROSCOPIC (REFLEX) - Abnormal; Notable for the following components:   Bacteria, UA RARE (*)    All other components within normal limits  TROPONIN I    EKG None  Radiology Dg Chest 2 View  Result Date: 08/23/2018 CLINICAL DATA:  78 year old female with fever cough and congestion for 5 days. Former smoker. EXAM: CHEST - 2 VIEW COMPARISON:  05/16/2018 and earlier. FINDINGS: Stable cardiomegaly and mediastinal contours. Lung volumes appear stable and mildly hyperinflated. Visualized tracheal air column is within normal limits. No pneumothorax, pulmonary edema, pleural effusion or consolidation. No convincing acute pulmonary opacity. IVC filter redemonstrated. Abdominal Calcified aortic atherosclerosis. Negative visible bowel gas pattern. No acute osseous abnormality identified. IMPRESSION: Chronic cardiomegaly and suspected pulmonary hyperinflation with no convincing acute cardiopulmonary abnormality. Electronically Signed   By: Odessa FlemingH  Hall M.D.   On: 08/23/2018 15:26    Procedures Procedures (including critical care time)  Medications Ordered in ED Medications  acetaminophen (TYLENOL) tablet 650 mg (650 mg Oral Given 08/23/18 1405)     Initial Impression / Assessment and Plan / ED Course  I have reviewed the triage vital signs and the nursing notes.  Pertinent labs & imaging results that were available during my care of the patient were reviewed by me and considered in my medical  decision making (see chart for details).    Patient with a chief complaint of soreness all over since Sunday presents to the ED stating her symptoms have worsened.  During arrival patient is febrile, reports she has some abdominal pain and did not know she had a fever.  CBC showed slight cytosis at 11.1, hemoglobin is within normal limits.  CMP shows slight decrease in potassium.  Glucose slightly elevated along with decreased calcium.  BNP pending.   BNP was 552.5, no rales on auscultation, chest xray showed: Chronic cardiomegaly and suspected pulmonary hyperinflation with no  convincing acute cardiopulmonary abnormality.    No signs of CHF exacerbation. She does report a cough but chest xray negative for any consolidation. UA showed rare bacteria, no nitries, a trace of leukocytes but 0-5 WBC.   Time I have discussed that I suspect she is likely suffering from viral illness, no UTI, labs are reassuring, neuro exam was unremarkable.  She is advised to follow-up with PCP in upcoming week.  Will continue to take Tylenol or ibuprofen to relieve her all over her body soreness.  Low suspicion for influenza due to no nasal congestion during examination is overall well-appearing.  Patient and daughter at the bedside report understanding and agreement.  Return precautions provided.  I have discussed patient with Dr. Adela LankFloyd who agrees with management.   Final Clinical Impressions(s) / ED Diagnoses   Final diagnoses:  Viral illness    ED Discharge Orders    None       Claude MangesSoto, Kindsey Eblin, PA-C 08/23/18 1757    Melene PlanFloyd, Dan, DO 08/23/18 1804

## 2018-08-23 NOTE — Discharge Instructions (Addendum)
All your laboratory results were within normal limits today. Please follow up with your primary care physician as needed. If you experience any chest pain, shortness of breath please return to the ED.

## 2019-02-10 DIAGNOSIS — I4821 Permanent atrial fibrillation: Secondary | ICD-10-CM | POA: Diagnosis present

## 2019-02-10 DIAGNOSIS — I4891 Unspecified atrial fibrillation: Secondary | ICD-10-CM | POA: Diagnosis present

## 2019-11-10 ENCOUNTER — Emergency Department (HOSPITAL_BASED_OUTPATIENT_CLINIC_OR_DEPARTMENT_OTHER): Payer: Medicare Other

## 2019-11-10 ENCOUNTER — Other Ambulatory Visit: Payer: Self-pay

## 2019-11-10 ENCOUNTER — Observation Stay (HOSPITAL_BASED_OUTPATIENT_CLINIC_OR_DEPARTMENT_OTHER)
Admission: EM | Admit: 2019-11-10 | Discharge: 2019-11-11 | Discharge status: Against Medical Advice | Payer: Medicare Other | Attending: Internal Medicine | Admitting: Internal Medicine

## 2019-11-10 ENCOUNTER — Encounter (HOSPITAL_BASED_OUTPATIENT_CLINIC_OR_DEPARTMENT_OTHER): Payer: Self-pay | Admitting: Emergency Medicine

## 2019-11-10 DIAGNOSIS — A419 Sepsis, unspecified organism: Principal | ICD-10-CM | POA: Insufficient documentation

## 2019-11-10 DIAGNOSIS — Z79899 Other long term (current) drug therapy: Secondary | ICD-10-CM | POA: Insufficient documentation

## 2019-11-10 DIAGNOSIS — Z20822 Contact with and (suspected) exposure to covid-19: Secondary | ICD-10-CM | POA: Insufficient documentation

## 2019-11-10 DIAGNOSIS — J449 Chronic obstructive pulmonary disease, unspecified: Secondary | ICD-10-CM | POA: Diagnosis not present

## 2019-11-10 DIAGNOSIS — R7989 Other specified abnormal findings of blood chemistry: Secondary | ICD-10-CM | POA: Diagnosis not present

## 2019-11-10 DIAGNOSIS — R1011 Right upper quadrant pain: Secondary | ICD-10-CM | POA: Diagnosis not present

## 2019-11-10 DIAGNOSIS — Z7983 Long term (current) use of bisphosphonates: Secondary | ICD-10-CM | POA: Insufficient documentation

## 2019-11-10 DIAGNOSIS — Z7952 Long term (current) use of systemic steroids: Secondary | ICD-10-CM | POA: Diagnosis not present

## 2019-11-10 DIAGNOSIS — I509 Heart failure, unspecified: Secondary | ICD-10-CM | POA: Insufficient documentation

## 2019-11-10 DIAGNOSIS — I110 Hypertensive heart disease with heart failure: Secondary | ICD-10-CM | POA: Insufficient documentation

## 2019-11-10 DIAGNOSIS — Z7984 Long term (current) use of oral hypoglycemic drugs: Secondary | ICD-10-CM | POA: Diagnosis not present

## 2019-11-10 DIAGNOSIS — I4891 Unspecified atrial fibrillation: Secondary | ICD-10-CM | POA: Insufficient documentation

## 2019-11-10 DIAGNOSIS — E119 Type 2 diabetes mellitus without complications: Secondary | ICD-10-CM | POA: Diagnosis not present

## 2019-11-10 DIAGNOSIS — Z87891 Personal history of nicotine dependence: Secondary | ICD-10-CM | POA: Insufficient documentation

## 2019-11-10 DIAGNOSIS — Z7901 Long term (current) use of anticoagulants: Secondary | ICD-10-CM | POA: Insufficient documentation

## 2019-11-10 HISTORY — DX: Sepsis, unspecified organism: A41.9

## 2019-11-10 LAB — COMPREHENSIVE METABOLIC PANEL
ALT: 14 U/L (ref 0–44)
AST: 17 U/L (ref 15–41)
Albumin: 2.9 g/dL — ABNORMAL LOW (ref 3.5–5.0)
Alkaline Phosphatase: 126 U/L (ref 38–126)
Anion gap: 11 (ref 5–15)
BUN: 13 mg/dL (ref 8–23)
CO2: 16 mmol/L — ABNORMAL LOW (ref 22–32)
Calcium: 8.5 mg/dL — ABNORMAL LOW (ref 8.9–10.3)
Chloride: 104 mmol/L (ref 98–111)
Creatinine, Ser: 1.24 mg/dL — ABNORMAL HIGH (ref 0.44–1.00)
GFR calc Af Amer: 48 mL/min — ABNORMAL LOW (ref >60)
GFR calc non Af Amer: 42 mL/min — ABNORMAL LOW (ref >60)
Glucose, Bld: 280 mg/dL — ABNORMAL HIGH (ref 70–99)
Potassium: 4.5 mmol/L (ref 3.5–5.1)
Sodium: 131 mmol/L — ABNORMAL LOW (ref 135–145)
Total Bilirubin: 0.8 mg/dL (ref 0.3–1.2)
Total Protein: 7.1 g/dL (ref 6.5–8.1)

## 2019-11-10 LAB — URINALYSIS, ROUTINE W REFLEX MICROSCOPIC
Bilirubin Urine: NEGATIVE
Glucose, UA: 250 mg/dL — AB
Ketones, ur: NEGATIVE mg/dL
Leukocytes,Ua: NEGATIVE
Nitrite: NEGATIVE
Protein, ur: NEGATIVE mg/dL
Specific Gravity, Urine: <1.005 — ABNORMAL LOW (ref 1.005–1.030)
pH: 5.5 (ref 5.0–8.0)

## 2019-11-10 LAB — RENAL FUNCTION PANEL
Albumin: 2.5 g/dL — ABNORMAL LOW (ref 3.5–5.0)
Anion gap: 7 (ref 5–15)
BUN: 14 mg/dL (ref 8–23)
CO2: 20 mmol/L — ABNORMAL LOW (ref 22–32)
Calcium: 7.7 mg/dL — ABNORMAL LOW (ref 8.9–10.3)
Chloride: 109 mmol/L (ref 98–111)
Creatinine, Ser: 1.12 mg/dL — ABNORMAL HIGH (ref 0.44–1.00)
GFR calc Af Amer: 54 mL/min — ABNORMAL LOW (ref >60)
GFR calc non Af Amer: 47 mL/min — ABNORMAL LOW (ref >60)
Glucose, Bld: 209 mg/dL — ABNORMAL HIGH (ref 70–99)
Phosphorus: 2.5 mg/dL (ref 2.5–4.6)
Potassium: 4.3 mmol/L (ref 3.5–5.1)
Sodium: 136 mmol/L (ref 135–145)

## 2019-11-10 LAB — CBC WITH DIFFERENTIAL/PLATELET
Abs Immature Granulocytes: 0.14 10*3/uL — ABNORMAL HIGH (ref 0.00–0.07)
Basophils Absolute: 0.1 10*3/uL (ref 0.0–0.1)
Basophils Relative: 0 %
Eosinophils Absolute: 0 10*3/uL (ref 0.0–0.5)
Eosinophils Relative: 0 %
HCT: 44.3 % (ref 36.0–46.0)
Hemoglobin: 14.7 g/dL (ref 12.0–15.0)
Immature Granulocytes: 1 %
Lymphocytes Relative: 7 %
Lymphs Abs: 1.6 10*3/uL (ref 0.7–4.0)
MCH: 28.5 pg (ref 26.0–34.0)
MCHC: 33.2 g/dL (ref 30.0–36.0)
MCV: 85.9 fL (ref 80.0–100.0)
Monocytes Absolute: 1.4 10*3/uL — ABNORMAL HIGH (ref 0.1–1.0)
Monocytes Relative: 6 %
Neutro Abs: 18.4 10*3/uL — ABNORMAL HIGH (ref 1.7–7.7)
Neutrophils Relative %: 86 %
Platelets: 163 10*3/uL (ref 150–400)
RBC: 5.16 MIL/uL — ABNORMAL HIGH (ref 3.87–5.11)
RDW: 15.4 % (ref 11.5–15.5)
WBC: 21.5 10*3/uL — ABNORMAL HIGH (ref 4.0–10.5)
nRBC: 0 % (ref 0.0–0.2)

## 2019-11-10 LAB — PROTIME-INR
INR: 2.2 — ABNORMAL HIGH (ref 0.8–1.2)
Prothrombin Time: 24.2 seconds — ABNORMAL HIGH (ref 11.4–15.2)

## 2019-11-10 LAB — SARS CORONAVIRUS 2 AG (30 MIN TAT): SARS Coronavirus 2 Ag: NEGATIVE

## 2019-11-10 LAB — LACTIC ACID, PLASMA
Lactic Acid, Venous: 2 mmol/L (ref 0.5–1.9)
Lactic Acid, Venous: 2.4 mmol/L (ref 0.5–1.9)
Lactic Acid, Venous: 1.7 mmol/L (ref 0.5–1.9)

## 2019-11-10 LAB — TROPONIN I (HIGH SENSITIVITY)
Troponin I (High Sensitivity): 10 ng/L (ref <18)
Troponin I (High Sensitivity): 8 ng/L (ref <18)

## 2019-11-10 LAB — URINALYSIS, MICROSCOPIC (REFLEX): WBC, UA: NONE SEEN WBC/hpf (ref 0–5)

## 2019-11-10 LAB — D-DIMER, QUANTITATIVE: D-Dimer, Quant: 1.3 ug/mL-FEU — ABNORMAL HIGH (ref 0.00–0.50)

## 2019-11-10 LAB — RESPIRATORY PANEL BY RT PCR (FLU A&B, COVID)
Influenza A by PCR: NEGATIVE
Influenza B by PCR: NEGATIVE
SARS Coronavirus 2 by RT PCR: NEGATIVE

## 2019-11-10 LAB — BRAIN NATRIURETIC PEPTIDE: B Natriuretic Peptide: 386.1 pg/mL — ABNORMAL HIGH (ref 0.0–100.0)

## 2019-11-10 LAB — APTT: aPTT: 42 seconds — ABNORMAL HIGH (ref 24–36)

## 2019-11-10 MED ORDER — IPRATROPIUM-ALBUTEROL 0.5-2.5 (3) MG/3ML IN SOLN: 3.0000 mL | Freq: Four times a day (QID) | RESPIRATORY_TRACT | Status: DC | PRN

## 2019-11-10 MED ORDER — SODIUM CHLORIDE 0.9 % IV SOLN
2.0000 g | Freq: Two times a day (BID) | INTRAVENOUS | Status: DC
  Administered 2019-11-11: 06:00:00 2 g via INTRAVENOUS
  Filled 2019-11-10: qty 2

## 2019-11-10 MED ORDER — VANCOMYCIN HCL 750 MG/150ML IV SOLN
750.0000 mg | Freq: Two times a day (BID) | INTRAVENOUS | Status: DC
  Administered 2019-11-11: 07:00:00 750 mg via INTRAVENOUS
  Filled 2019-11-10: qty 150

## 2019-11-10 MED ORDER — SODIUM CHLORIDE 0.9 % IV BOLUS: 1000.0000 mL | Freq: Once | INTRAVENOUS | Status: DC

## 2019-11-10 MED ORDER — SODIUM CHLORIDE 0.9 % IV SOLN
INTRAVENOUS | Status: DC | PRN
  Administered 2019-11-10 (×2): 250 mL via INTRAVENOUS

## 2019-11-10 MED ORDER — BUSPIRONE HCL 5 MG PO TABS
5.0000 mg | ORAL_TABLET | Freq: Three times a day (TID) | ORAL | Status: DC
  Filled 2019-11-10: qty 1

## 2019-11-10 MED ORDER — SODIUM CHLORIDE 0.9 % IV BOLUS
1000.0000 mL | Freq: Once | INTRAVENOUS | Status: AC
  Administered 2019-11-10: 1000 mL via INTRAVENOUS

## 2019-11-10 MED ORDER — METFORMIN HCL 500 MG PO TABS
500.0000 mg | ORAL_TABLET | Freq: Two times a day (BID) | ORAL | Status: DC
  Administered 2019-11-11: 500 mg via ORAL
  Filled 2019-11-10: qty 1

## 2019-11-10 MED ORDER — IOHEXOL 350 MG/ML SOLN
100.0000 mL | Freq: Once | INTRAVENOUS | Status: AC | PRN
  Administered 2019-11-10: 19:00:00 100 mL via INTRAVENOUS

## 2019-11-10 MED ORDER — VANCOMYCIN HCL IN DEXTROSE 1-5 GM/200ML-% IV SOLN
1000.0000 mg | Freq: Once | INTRAVENOUS | Status: AC
  Administered 2019-11-10: 19:00:00 1000 mg via INTRAVENOUS
  Filled 2019-11-10: qty 200

## 2019-11-10 MED ORDER — WARFARIN SODIUM 1 MG PO TABS
3.0000 mg | ORAL_TABLET | ORAL | Status: DC
  Filled 2019-11-10: qty 3

## 2019-11-10 MED ORDER — LACTATED RINGERS IV BOLUS (SEPSIS)
1000.0000 mL | Freq: Once | INTRAVENOUS | Status: AC
  Administered 2019-11-10: 18:00:00 1000 mL via INTRAVENOUS

## 2019-11-10 MED ORDER — ASPIRIN 81 MG PO CHEW: 81.0000 mg | CHEWABLE_TABLET | Freq: Every day | ORAL | Status: DC

## 2019-11-10 MED ORDER — METOPROLOL SUCCINATE ER 25 MG PO TB24
25.0000 mg | ORAL_TABLET | Freq: Every day | ORAL | Status: DC
  Administered 2019-11-11: 09:00:00 25 mg via ORAL
  Filled 2019-11-10: qty 1

## 2019-11-10 MED ORDER — WARFARIN SODIUM 1 MG PO TABS
ORAL_TABLET | ORAL | Status: AC
  Filled 2019-11-10: qty 1

## 2019-11-10 MED ORDER — ACETAMINOPHEN 325 MG PO TABS
650.0000 mg | ORAL_TABLET | Freq: Once | ORAL | Status: AC
  Administered 2019-11-10: 650 mg via ORAL
  Filled 2019-11-10: qty 2

## 2019-11-10 MED ORDER — METRONIDAZOLE IN NACL 5-0.79 MG/ML-% IV SOLN
500.0000 mg | Freq: Once | INTRAVENOUS | Status: AC
  Administered 2019-11-10: 21:00:00 500 mg via INTRAVENOUS
  Filled 2019-11-10: qty 100

## 2019-11-10 MED ORDER — SODIUM CHLORIDE 0.9% FLUSH
3.0000 mL | Freq: Once | INTRAVENOUS | Status: DC
  Filled 2019-11-10: qty 3

## 2019-11-10 MED ORDER — WARFARIN SODIUM 1 MG PO TABS
2.0000 mg | ORAL_TABLET | Freq: Once | ORAL | Status: AC
  Administered 2019-11-10: 23:00:00 2 mg via ORAL

## 2019-11-10 MED ORDER — FUROSEMIDE 20 MG PO TABS
20.0000 mg | ORAL_TABLET | Freq: Every day | ORAL | Status: DC
  Administered 2019-11-11: 09:00:00 20 mg via ORAL
  Filled 2019-11-10: qty 1

## 2019-11-10 MED ORDER — SODIUM CHLORIDE 0.9 % IV SOLN
2.0000 g | Freq: Once | INTRAVENOUS | Status: AC
  Administered 2019-11-10: 18:00:00 2 g via INTRAVENOUS
  Filled 2019-11-10: qty 2

## 2019-11-10 NOTE — ED Notes (Signed)
Patient transported to CT 

## 2019-11-10 NOTE — ED Triage Notes (Signed)
States feels sore all over and weakness started on sat she has had abd pain since yesterday  , no vomiting but has had diarrhea  She states

## 2019-11-10 NOTE — ED Provider Notes (Addendum)
[facility] EMERGENCY DEPARTMENT Provider Note   CSN: [id number] Arrival date & time: 11/10/19  1530    History Chief Complaint  Patient presents with  . Weakness  . Generalized Body Aches  . Abdominal Pain    Jill Crane is a 79 y.o. female with past medical history significant for A. Fib coagulated on Coumadin, COPD, CHF, diabetes who presents for evaluation of "not feeling well."  Patient states she has had generalized body aches over the last "few days."  She has a chronic nonproductive cough.  Has had some nausea without emesis.  Admits to generalized abdominal pain.  Had multiple episodes of diarrhea on Saturday however did not have any stool yesterday or today.  She is passing flatulence.  Feels really weak.  Has had chills however has not measured her temperature.  Denies any Covid exposures.  Received both Covid vaccines.  Denies headache, lightheadedness, dizziness, chest pain, shortness of breath, dysuria, melena, bright red blood per rectum.  Denies additional aggravating or alleviating factors.  Patient also notes on exam he has tenderness to her left popliteal fossa.  She denies any recent falls or injuries.  Denies any edema, erythema or warmth of bilateral calves.  Does have noted large varicosities to bilateral lower extremities.  History obtained from patient and past medical records.  No interpreter is used.  HPI     Past Medical History:  Diagnosis Date  . Atrial fibrillation with normal ventricular rate (HCC)   . CHF (congestive heart failure) (HCC)   . COPD (chronic obstructive pulmonary disease) (HCC)   . Diabetes mellitus without complication (HCC)   . Hx of blood clots   . Hypertension     Patient Active Problem List   Diagnosis Date Noted  . Sepsis (HCC) 11/10/2019    History reviewed. No pertinent surgical history.   OB History   No obstetric history on file.     No family history on file.  Social History   Tobacco Use  .  Smoking status: Former Games developer  . Smokeless tobacco: Never Used  Substance Use Topics  . Alcohol use: No  . Drug use: Not on file    Home Medications Prior to Admission medications   Medication Sig Start Date End Date Taking? Authorizing Provider  albuterol (PROVENTIL HFA;VENTOLIN HFA) 108 (90 BASE) MCG/ACT inhaler Inhale 2 puffs into the lungs every 6 (six) hours as needed for wheezing or shortness of breath.    [provider]  alendronate (FOSAMAX) 35 MG tablet Take 35 mg by mouth every 7 (seven) days. Take with a full glass of water on an empty stomach.    [provider]  aspirin 81 MG chewable tablet Chew 81 mg by mouth daily.    [provider]  busPIRone (BUSPAR) 5 MG tablet Take 5 mg by mouth 3 (three) times daily.    [provider]  calcium-vitamin D (OSCAL WITH D) 500-200 MG-UNIT per tablet Take 1 tablet by mouth.    [provider]  DILTIAZEM HCL PO Take 240 mg by mouth. Pt is not sure if she is still on this med. Does not have an updated med list    [provider]  furosemide (LASIX) 20 MG tablet Take 40 mg (2 tablets) tomorrow and the day after, then continue with 20 mg daily 04/15/17   Harris, Cammy Copa, PA-C  ipratropium-albuterol (DUONEB) 0.5-2.5 (3) MG/3ML SOLN Take 3 mLs by nebulization.    [provider]  metFORMIN (GLUCOPHAGE)  500 MG tablet Take by mouth 2 (two) times daily with a meal.    [provider]  metoprolol succinate (TOPROL-XL) 25 MG 24 hr tablet Take 25 mg by mouth daily.    [provider]  metoprolol tartrate (LOPRESSOR) 25 MG tablet Take 25 mg by mouth 2 (two) times daily.    [provider]  omeprazole (PRILOSEC) 20 MG capsule Take 20 mg by mouth daily.    [provider]  predniSONE (DELTASONE) 10 MG tablet Take 5 mg by mouth daily with breakfast.    [provider]  warfarin (COUMADIN) 6 MG tablet Take 3 mg by mouth as directed.     [provider]    Allergies    Patient has no known allergies.  Review of Systems   Review of Systems  Constitutional: Positive for appetite change, chills and fatigue. Negative for fever.  HENT: Negative.   Respiratory: Positive for cough (Chronic). Negative for apnea, choking, chest tightness, shortness of breath, wheezing and stridor.   Cardiovascular: Negative.   Gastrointestinal: Positive for abdominal pain, diarrhea and nausea. Negative for abdominal distention, anal bleeding, blood in stool, constipation and vomiting.  Genitourinary: Negative.   Musculoskeletal: Negative for gait problem.       Pain to left popliteal fossa  Skin: Negative.   Neurological: Positive for weakness (Generalized). Negative for dizziness, tremors, seizures, syncope, speech difficulty, light-headedness, numbness and headaches.  All other systems reviewed and are negative.   Physical Exam Updated Vital Signs BP 132/87 (BP Location: Right Arm)   Pulse 81   Temp 98.9 F (37.2 C) (Rectal)   Resp 19   Ht 5\' 6"  (1.676 m)   Wt 79.7 kg   SpO2 97%   BMI 28.34 kg/m   Physical Exam Vitals and nursing note reviewed.  Constitutional:      General: She is not in acute distress.    Appearance: She is well-developed. She is not toxic-appearing.  HENT:     Head: Normocephalic and atraumatic.     Mouth/Throat:     Comments: Dry mucous membranes Eyes:     Pupils: Pupils are equal, round, and reactive to light.  Cardiovascular:     Rate and Rhythm: Normal rate. Rhythm irregular.     Heart sounds: Normal heart sounds.  Pulmonary:     Effort: Pulmonary effort is normal. No respiratory distress.     Breath sounds: Normal breath sounds.     Comments: Speaks in full sentences without difficulty Abdominal:     General: Bowel sounds are normal. There is no distension.     Palpations: Abdomen is soft.     Tenderness: There is generalized abdominal tenderness. There is no right CVA tenderness, left CVA  tenderness, guarding or rebound. Negative signs include Murphy's sign and McBurney's sign.  Musculoskeletal:        General: Normal range of motion.     Cervical back: Normal range of motion.     Comments: No bony tenderness.  Compartments soft.  ' sign negative.  Mild tenderness to left popliteal fossa without overlying skin changes.  Patient with large varicosities of bilateral lower extremities without tenderness or overlying skin changes  Skin:    General: Skin is warm and dry.     Capillary Refill: Capillary refill takes less than 2 seconds.     Comments: No edema, erythema, warmth. Brisk cap refill  Neurological:     General: No focal deficit present.     Mental  Status: She is alert and oriented to person, place, and time.     Comments: At baseline per daughter.    ED Results / Procedures / Treatments   Labs (all labs ordered are listed, but only abnormal results are displayed) Labs Reviewed  URINALYSIS, ROUTINE W REFLEX MICROSCOPIC - Abnormal; Notable for the following components:      Result Value   Specific Gravity, Urine <1.005 (*)    Glucose, UA 250 (*)    Hgb urine dipstick TRACE (*)    All other components within normal limits  COMPREHENSIVE METABOLIC PANEL - Abnormal; Notable for the following components:   Sodium 131 (*)    CO2 16 (*)    Glucose, Bld 280 (*)    Creatinine, Ser 1.24 (*)    Calcium 8.5 (*)    Albumin 2.9 (*)    GFR calc non Af Amer 42 (*)    GFR calc Af Amer 48 (*)    All other components within normal limits  PROTIME-INR - Abnormal; Notable for the following components:   Prothrombin Time 24.2 (*)    INR 2.2 (*)    All other components within normal limits  CBC WITH DIFFERENTIAL/PLATELET - Abnormal; Notable for the following components:   WBC 21.5 (*)    RBC 5.16 (*)    Neutro Abs 18.4 (*)    Monocytes Absolute 1.4 (*)    Abs Immature Granulocytes 0.14 (*)    All other components within normal limits  LACTIC ACID, PLASMA - Abnormal;  Notable for the following components:   Lactic Acid, Venous 2.0 (*)    All other components within normal limits  LACTIC ACID, PLASMA - Abnormal; Notable for the following components:   Lactic Acid, Venous 2.4 (*)    All other components within normal limits  APTT - Abnormal; Notable for the following components:   aPTT 42 (*)    All other components within normal limits  BRAIN NATRIURETIC PEPTIDE - Abnormal; Notable for the following components:   B Natriuretic Peptide 386.1 (*)    All other components within normal limits  D-DIMER, QUANTITATIVE (NOT AT [facility]) - Abnormal; Notable for the following components:   D-Dimer, Quant 1.30 (*)    All other components within normal limits  URINALYSIS, MICROSCOPIC (REFLEX) - Abnormal; Notable for the following components:   Bacteria, UA RARE (*)    All other components within normal limits  SARS CORONAVIRUS 2 AG (30 MIN TAT)  RESPIRATORY PANEL BY RT PCR (FLU A&B, COVID)  CULTURE, BLOOD (ROUTINE X 2)  CULTURE, BLOOD (ROUTINE X 2)  URINE CULTURE  LACTIC ACID, PLASMA  LACTIC ACID, PLASMA  RENAL FUNCTION PANEL  CBC WITH DIFFERENTIAL/PLATELET  BASIC METABOLIC PANEL  CBG MONITORING, ED  TROPONIN I (HIGH SENSITIVITY)  TROPONIN I (HIGH SENSITIVITY)    EKG EKG Interpretation  Date/Time:  Monday November 10 2019 15:44:57 EDT Ventricular Rate:  85 PR Interval:    QRS Duration: 74 QT Interval:  352 QTC Calculation: 418 R Axis:   22 Text Interpretation: Atrial fibrillation ST & T wave abnormality, consider lateral ischemia Abnormal ECG No significant change since prior 12/19 Confirmed by Meridee Score [phone]) on 11/10/2019 3:56:19 PM   Radiology CT Angio Chest PE W and/or Wo Contrast  Result Date: 11/10/2019 CLINICAL DATA:  Diffuse soreness and weakness. EXAM: CT ANGIOGRAPHY CHEST CT ABDOMEN AND PELVIS WITH CONTRAST TECHNIQUE: Multidetector CT imaging of the chest was performed using the standard protocol during bolus administration of  intravenous contrast.  Multiplanar CT image reconstructions and MIPs were obtained to evaluate the vascular anatomy. Multidetector CT imaging of the abdomen and pelvis was performed using the standard protocol during bolus administration of intravenous contrast. CONTRAST:  100mL OMNIPAQUE IOHEXOL 350 MG/ML SOLN COMPARISON:  April 05, 2013 FINDINGS: CTA CHEST FINDINGS Cardiovascular: There is marked severity calcification of the thoracic aorta. Satisfactory opacification of the pulmonary arteries to the segmental level. No evidence of pulmonary embolism. There is mild cardiomegaly. No pericardial effusion. Mediastinum/Nodes: No enlarged mediastinal, hilar, or axillary lymph nodes. Thyroid gland, trachea, and esophagus demonstrate no significant findings. Lungs/Pleura: Lungs are clear. No pleural effusion or pneumothorax. Musculoskeletal: Multilevel degenerative changes seen throughout the thoracic spine with moderate severity levoscoliosis. Review of the MIP images confirms the above findings. CT ABDOMEN and PELVIS FINDINGS Hepatobiliary: A well-defined 9 mm focus of parenchymal low attenuation is seen within the lateral aspect of the right lobe of the liver. Several tiny gallstones are seen within the dependent portion of a moderately distended gallbladder. There is no evidence of gallbladder wall thickening or biliary dilatation. A trace amount of pericholecystic inflammation is seen. Pancreas: Unremarkable. No pancreatic ductal dilatation or surrounding inflammatory changes. Spleen: Normal in size without focal abnormality. Adrenals/Urinary Tract: Adrenal glands are unremarkable. 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 Kidneys are normal in size, without renal calculi or hydronephrosis. A 2.0 cm simple cyst is seen within the posterior aspect of the mid to lower left kidney. Bladder is unremarkable. Stomach/Bowel: Stomach is within normal limits. Appendix appears normal. No evidence of bowel  wall thickening, distention, or inflammatory changes. Noninflamed diverticula are seen throughout the large bowel. Vascular/Lymphatic: Marked severity aortic calcification. An inferior vena cava filter is noted. No enlarged abdominal or pelvic lymph nodes. Reproductive: Uterus and bilateral adnexa are unremarkable. Other: No abdominal wall hernia or abnormality. No abdominopelvic ascites. Musculoskeletal: Multilevel degenerative changes seen throughout the lumbar spine Review of the MIP images confirms the above findings. IMPRESSION: 1. No evidence for pulmonary embolus. 2. Mild cardiomegaly. 3. Cholelithiasis with trace amount of pericholecystic inflammation. If there is clinical concern for acute cholecystitis, further evaluation with right upper quadrant ultrasound is recommended. 4. Colonic diverticulosis without evidence for diverticulitis. 5. Marked severity aortic calcification. 6. Multilevel degenerative changes throughout the thoracic and lumbar spine with moderate severity levoscoliosis. 7. Inferior vena cava filter. Aortic Atherosclerosis (ICD10-I70.0). Electronically Signed   By: Aram Candelahaddeus  Houston M.D.   On: 11/10/2019 19:30   CT Abdomen Pelvis W Contrast  Result Date: 11/10/2019 CLINICAL DATA:  Diffuse soreness and generalized weakness. EXAM: CT ANGIOGRAPHY CHEST CT ABDOMEN AND PELVIS WITH CONTRAST TECHNIQUE: Multidetector CT imaging of the chest was performed using the standard protocol during bolus administration of intravenous contrast. Multiplanar CT image reconstructions and MIPs were obtained to evaluate the vascular anatomy. Multidetector CT imaging of the abdomen and pelvis was performed using the standard protocol during bolus administration of intravenous contrast. CONTRAST:  100mL OMNIPAQUE IOHEXOL 350 MG/ML SOLN COMPARISON:  None. FINDINGS: CTA CHEST FINDINGS Cardiovascular: There is marked severity calcification of the thoracic aorta. Satisfactory opacification of the pulmonary arteries  to the segmental level. No evidence of pulmonary embolism. There is mild cardiomegaly. No pericardial effusion. Mediastinum/Nodes: No enlarged mediastinal, hilar, or axillary lymph nodes. Thyroid gland, trachea, and esophagus demonstrate no significant findings. Lungs/Pleura: Lungs are clear. No pleural effusion or pneumothorax. Musculoskeletal: Multilevel degenerative changes seen throughout the thoracic spine with moderate severity levoscoliosis. Review of the MIP images confirms the above findings. CT ABDOMEN  and PELVIS FINDINGS Hepatobiliary: A well-defined 9 mm focus of parenchymal low attenuation is seen within the lateral aspect of the right lobe of the liver. Several tiny gallstones are seen within the dependent portion of a moderately distended gallbladder. There is no evidence of gallbladder wall thickening or biliary dilatation. A trace amount of pericholecystic inflammation is seen. Pancreas: Unremarkable. No pancreatic ductal dilatation or surrounding inflammatory changes. Spleen: Normal in size without focal abnormality. Adrenals/Urinary Tract: Adrenal glands are unremarkable. 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 Kidneys are normal in size, without renal calculi or hydronephrosis. A 2.0 cm simple cyst is seen within the posterior aspect of the mid to lower left kidney. Bladder is unremarkable. Stomach/Bowel: Stomach is within normal limits. Appendix appears normal. No evidence of bowel wall thickening, distention, or inflammatory changes. Noninflamed diverticula are seen throughout the large bowel. Vascular/Lymphatic: Marked severity aortic calcification. An inferior vena cava filter is noted. No enlarged abdominal or pelvic lymph nodes. Reproductive: Uterus and bilateral adnexa are unremarkable. Other: No abdominal wall hernia or abnormality. No abdominopelvic ascites. Musculoskeletal: Multilevel degenerative changes seen throughout the lumbar spine Review of the MIP images  confirms the above findings. IMPRESSION: 1. No evidence for pulmonary embolus. 2. Mild cardiomegaly. 3. Cholelithiasis with trace amount of pericholecystic inflammation. If there is clinical concern for acute cholecystitis, further evaluation with right upper quadrant ultrasound is recommended. 4. Colonic diverticulosis without evidence for diverticulitis. 5. Marked severity aortic calcification. 6. Multilevel degenerative changes throughout the thoracic and lumbar spine with moderate severity levoscoliosis. 7. Inferior vena cava filter. Aortic Atherosclerosis (ICD10-I70.0). Electronically Signed   By: Aram Candela M.D.   On: 11/10/2019 19:31   US Venous Img Lower Unilateral Left  Result Date: 11/10/2019 CLINICAL DATA:  Pain in left popliteal fossa. EXAM: LEFT LOWER EXTREMITY VENOUS DOPPLER ULTRASOUND TECHNIQUE: Gray-scale sonography with compression, as well as color and duplex ultrasound, were performed to evaluate the deep venous system(s) from the level of the common femoral vein through the popliteal and proximal calf veins. COMPARISON:  None. FINDINGS: VENOUS Normal compressibility of the common femoral, superficial femoral, and popliteal veins, as well as the visualized calf veins. Visualized portions of profunda femoral vein and great saphenous vein unremarkable. No filling defects to suggest DVT on grayscale or color Doppler imaging. Doppler waveforms show normal direction of venous flow, normal respiratory plasticity and response to augmentation. Limited views of the contralateral common femoral vein are unremarkable. OTHER None. Limitations: none IMPRESSION: Negative. Electronically Signed   By: Aram Candela M.D.   On: 11/10/2019 19:35   DG Chest Port 1 View  Result Date: 11/10/2019 CLINICAL DATA:  Cough. EXAM: PORTABLE CHEST 1 VIEW COMPARISON:  Chest radiograph 08/23/2018 FINDINGS: Stable cardiomediastinal contours with enlarged heart size. Aortic calcification. There are a few linear  opacities in the bilateral lung bases favored to represent atelectasis. No focal consolidation. No pneumothorax or large pleural effusion. No acute finding in the visualized skeleton. IMPRESSION: Minimal bibasilar atelectasis. No evidence of active disease. Electronically Signed   By: Emmaline Kluver M.D.   On: 11/10/2019 17:50    Procedures .Critical Care Performed by: Linwood Dibbles, PA-C Authorized by: Linwood Dibbles, PA-C   Critical care provider statement:    Critical care time (minutes):  45   Critical care was necessary to treat or prevent imminent or life-threatening deterioration of the following conditions:  Sepsis   Critical care was time spent personally by me on the following activities:  Discussions with  consultants, evaluation of patient's response to treatment, examination of patient, ordering and performing treatments and interventions, ordering and review of laboratory studies, ordering and review of radiographic studies, pulse oximetry, re-evaluation of patient's condition, obtaining history from patient or surrogate and review of old charts   (including critical care time)  Medications Ordered in ED Medications  sodium chloride flush (NS) 0.9 % injection 3 mL (3 mLs Intravenous Not Given 11/10/19 1805)  ceFEPIme (MAXIPIME) 2 g in sodium chloride 0.9 % 100 mL IVPB (0 g Intravenous Stopping Infusion hung by another clincian 11/10/19 2224)  vancomycin (VANCOREADY) IVPB 750 mg/150 mL (has no administration in time range)  0.9 %  sodium chloride infusion (250 mLs Intravenous New Bag/Given 11/10/19 1919)  warfarin (COUMADIN) tablet 3 mg (has no administration in time range)  metoprolol succinate (TOPROL-XL) 24 hr tablet 25 mg (has no administration in time range)  metFORMIN (GLUCOPHAGE) tablet 500 mg (has no administration in time range)  ipratropium-albuterol (DUONEB) 0.5-2.5 (3) MG/3ML nebulizer solution 3 mL (has no administration in time range)  furosemide (LASIX)  tablet 20 mg (has no administration in time range)  busPIRone (BUSPAR) tablet 5 mg (has no administration in time range)  aspirin chewable tablet 81 mg (has no administration in time range)  ceFEPIme (MAXIPIME) 2 g in sodium chloride 0.9 % 100 mL IVPB ( Intravenous Stopped 11/10/19 1832)  metroNIDAZOLE (FLAGYL) IVPB 500 mg (500 mg Intravenous New Bag/Given 11/10/19 2124)  vancomycin (VANCOCIN) IVPB 1000 mg/200 mL premix (0 mg Intravenous Stopped 11/10/19 2102)  lactated ringers bolus 1,000 mL (0 mLs Intravenous Stopping Infusion hung by another clincian 11/10/19 2223)  acetaminophen (TYLENOL) tablet 650 mg (650 mg Oral Given 11/10/19 1805)  iohexol (OMNIPAQUE) 350 MG/ML injection 100 mL (100 mLs Intravenous Contrast Given 11/10/19 1844)  sodium chloride 0.9 % bolus 1,000 mL (1,000 mLs Intravenous New Bag/Given 11/10/19 2124)   ED Course  I have reviewed the triage vital signs and the nursing notes.  Pertinent labs & imaging results that were available during my care of the patient were reviewed by me and considered in my medical decision making (see chart for details).  79 year old female appears otherwise well presents for evaluation of generalized not feeling well.  Patient has low-grade temp.  Chronic cough.  Denies any PND orthopnea.  Denies any chest pain or shortness of breath.  No hemoptysis.  Abdomen some mild diffuse tenderness however no focal tenderness.  Had diarrhea on Saturday has not bowel movement since.  Denies any melena or bright red blood per rectum.  Admits to some tenderness to her left popliteal fossa.  No recent injury or trauma. NO rahes or lesions however patient refused to removed shoes and socks. Discussed need for assessment to r/o infection given fever however patient refused multiple times. No overlying skin changes.  Bevelyn Buckles' sign negative.  She is currently anticoagulated on Coumadin for atrial fibrillation as well as DVT.  Has not missed any doses.  Labs obtained from  triage.  On initial evaluation patient's labs have returned.  She has leukocytosis to 21.5.  Given this and fever code sepsis called.  Broad-spectrum antibiotics.  Plan on giving 1 L of fluids until lactic has resulted.  Labs and imaging personally reviewed and interpreted: CBC with leukocytosis to 21.5 with left shift Metabolic panel with mild hyponatremia to 131, hyperglycemia to 280, creatinine 1.24 INR 2.2 EKG with Atrial fibrillation, rate controlled.  No STEMI. Trop 10>>8 Lactic acid 2.0>>2.4 UA negative for infection DG chest without  infiltrates, cardiomegaly  COVID negative BNP 386.1 DDimer 1.30 US DVT negative  Given elevated BNP will hold on additional IVF. MAP >65 and lactic <4.  Patient reassessed.  Minimal diffuse tenderness to abd.  She is very minimally tender at her right upper quadrant however seems to be just as tender to her entire abdomen. Negative Murphy sign.  Discussed CT imaging.  Will place ultrasound.  Unfortunately ultrasound not available at this time.  Will need admission for sepsis unknown origin. Clinically does not have cholecystitis on exam whoever given no source of infection, reasonable to obtain US to ensure not missing cholecystitis. Plan ultrasound at admitting facility to rule out as cause of her fever and leukocytosis.  I do not see evidence of rashes, lesions or skin infections as cause of her sepsis.  2100: Patient reassessed now with daughter and mom.  Discussed plan with admission for continued antibiotics.  They are agreeable to this.  Patient home medications ordered.  Given unsure of cause of leukocytosis, possible sepsis we will give her home Coumadin has no evidence of surgical emergency at this time.  I have placed right upper quadrant ultrasound to likely be performed in the morning should she not have bed availability overnight. Do not think she needs emergent transfer to another facility for [place] at this time.   CONSULT with Dr. Leafy Half  with [facility] who used to accept patient in transfer.  Discussed unknown cause and CT findings. Recommends keeping ultrasound order in place in case patient is still here in the emergency department tomorrow morning when our ultrasound tech is available otherwise may obtain US to R/O gallbladder as cause of leukocytosis once at admitting facility.  Dr. Blinda Leatherwood night time EDP aware patient in ED and plan   Clinical Course as of Nov 10 2251  Mon Nov 10, 2019  [id number] 79 year old female here with some generalized weakness for a week and so abdominal pain that started today.  She is febrile to 100.4 here with an elevated lactate.  Work-up is been not showing any specific answer but she does have an elevated white count.  She received broad-spectrum antibiotics and will be admitted to the hospital for further work-up.   [MB]    Clinical Course User Index [MB] Terrilee Files, MD   MDM Rules/Calculators/A&P                      Jill Crane was evaluated in Emergency Department on 11/10/2019 for the symptoms described in the history of present illness. She was evaluated in the context of the global COVID-19 pandemic, which necessitated consideration that the patient might be at risk for infection with the SARS-CoV-2 virus that causes COVID-19. Institutional protocols and algorithms that pertain to the evaluation of patients at risk for COVID-19 are in a state of rapid change based on information released by regulatory bodies including the CDC and federal and state organizations. These policies and algorithms were followed during the patient's care in the ED. Final Clinical Impression(s) / ED Diagnoses Final diagnoses:  RUQ pain  Sepsis, due to unspecified organism, unspecified whether acute organ dysfunction present [facility])    Rx / DC Orders ED Discharge Orders    None       Xochil Shanker A, PA-C 11/10/19 2253    Clark Cuff A, PA-C 11/10/19 2332    Josiephine Simao A, PA-C 11/11/19  0912    Terrilee Files, MD 11/11/19 1052

## 2019-11-10 NOTE — ED Notes (Signed)
Had EKG and labs drawn

## 2019-11-10 NOTE — Progress Notes (Signed)
Pharmacy Antibiotic Note  Jill Crane is a 79 y.o. female admitted on 11/10/2019 with general feeling of being unwell, nausea and recent diarrha.  Pharmacy has been consulted for Cefepime and vancomycin dosing for concern of infection from unknown source.  WBC up to 21.5, Tm 100.67F. SCr 1.24.   Plan: -Cefepime 2 gm IV Q 12 hours -Start vancomycin 750 mg IV Q 12 hours per traditional nomogram  -Monitor CBC, renal fx, cultures and clinical progress -VT as indicated   Height: 5\' 6"  (167.6 cm) Weight: 80.7 kg (178 lb) IBW/kg (Calculated) : 59.3  Temp (24hrs), Avg:100.4 F (38 C), Min:100.4 F (38 C), Max:100.4 F (38 C)  Recent Labs  Lab 11/10/19 1552  WBC 21.5*  CREATININE 1.24*    Estimated Creatinine Clearance: 40.1 mL/min (A) (by C-G formula based on SCr of 1.24 mg/dL (H)).    No Known Allergies  Antimicrobials this admission: Vanc 4/26 >>  Cefepime 4/26 >>   Dose adjustments this admission:   Microbiology results:   Thank you for allowing pharmacy to be a part of this patient's care.  5/26, PharmD., BCPS, BCCCP Clinical Pharmacist Clinical phone for 11/10/19 until 11pm: [phone] If after 11pm, please refer to [facility] for unit-specific pharmacist

## 2019-11-10 NOTE — ED Notes (Signed)
Spoke with pt's daughter, Liborio Nixon, to let her know she could visit pt now

## 2019-11-10 NOTE — Progress Notes (Signed)
Communicated with bedside RN Konrad Felix who confirmed she is hanging 2nd liter of NS and RN will be drawing follow up lactic after fluid bolus

## 2019-11-11 LAB — CBC WITH DIFFERENTIAL/PLATELET
Abs Immature Granulocytes: 0.13 10*3/uL — ABNORMAL HIGH (ref 0.00–0.07)
Basophils Absolute: 0 10*3/uL (ref 0.0–0.1)
Basophils Relative: 0 %
Eosinophils Absolute: 0 10*3/uL (ref 0.0–0.5)
Eosinophils Relative: 0 %
HCT: 41.5 % (ref 36.0–46.0)
Hemoglobin: 14 g/dL (ref 12.0–15.0)
Immature Granulocytes: 1 %
Lymphocytes Relative: 10 %
Lymphs Abs: 2 10*3/uL (ref 0.7–4.0)
MCH: 29.2 pg (ref 26.0–34.0)
MCHC: 33.7 g/dL (ref 30.0–36.0)
MCV: 86.5 fL (ref 80.0–100.0)
Monocytes Absolute: 1.8 10*3/uL — ABNORMAL HIGH (ref 0.1–1.0)
Monocytes Relative: 9 %
Neutro Abs: 16 10*3/uL — ABNORMAL HIGH (ref 1.7–7.7)
Neutrophils Relative %: 80 %
Platelets: 156 10*3/uL (ref 150–400)
RBC: 4.8 MIL/uL (ref 3.87–5.11)
RDW: 15.7 % — ABNORMAL HIGH (ref 11.5–15.5)
WBC: 19.9 10*3/uL — ABNORMAL HIGH (ref 4.0–10.5)
nRBC: 0 % (ref 0.0–0.2)

## 2019-11-11 LAB — BASIC METABOLIC PANEL
Anion gap: 7 (ref 5–15)
BUN: 13 mg/dL (ref 8–23)
CO2: 21 mmol/L — ABNORMAL LOW (ref 22–32)
Calcium: 8.3 mg/dL — ABNORMAL LOW (ref 8.9–10.3)
Chloride: 108 mmol/L (ref 98–111)
Creatinine, Ser: 0.92 mg/dL (ref 0.44–1.00)
GFR calc Af Amer: >60 mL/min (ref >60)
GFR calc non Af Amer: 60 mL/min — ABNORMAL LOW (ref >60)
Glucose, Bld: 151 mg/dL — ABNORMAL HIGH (ref 70–99)
Potassium: 4.3 mmol/L (ref 3.5–5.1)
Sodium: 136 mmol/L (ref 135–145)

## 2019-11-11 LAB — URINE CULTURE: Culture: NO GROWTH

## 2019-11-11 LAB — PROTIME-INR
INR: 1.6 — ABNORMAL HIGH (ref 0.8–1.2)
Prothrombin Time: 18.7 seconds — ABNORMAL HIGH (ref 11.4–15.2)

## 2019-11-11 MED ORDER — MORPHINE SULFATE (PF) 2 MG/ML IV SOLN
2.0000 mg | Freq: Once | INTRAVENOUS | Status: AC
  Administered 2019-11-11: 02:00:00 2 mg via INTRAVENOUS

## 2019-11-11 MED ORDER — VANCOMYCIN HCL IN DEXTROSE 1-5 GM/200ML-% IV SOLN
INTRAVENOUS | Status: AC
  Filled 2019-11-11: qty 200

## 2019-11-11 MED ORDER — VANCOMYCIN HCL 1000 MG IV SOLR
INTRAVENOUS | Status: AC
  Filled 2019-11-11: qty 1000

## 2019-11-11 MED ORDER — ONDANSETRON HCL 4 MG/2ML IJ SOLN
INTRAMUSCULAR | Status: AC
  Filled 2019-11-11: qty 2

## 2019-11-11 MED ORDER — METRONIDAZOLE 500 MG PO TABS
500.0000 mg | ORAL_TABLET | Freq: Three times a day (TID) | ORAL | 0 refills | Status: DC
Start: 2019-11-11 — End: 2020-08-15

## 2019-11-11 MED ORDER — MORPHINE SULFATE (PF) 2 MG/ML IV SOLN
INTRAVENOUS | Status: AC
  Filled 2019-11-11: qty 1

## 2019-11-11 MED ORDER — CEFDINIR 300 MG PO CAPS
300.0000 mg | ORAL_CAPSULE | Freq: Two times a day (BID) | ORAL | 0 refills | Status: DC
Start: 2019-11-11 — End: 2020-08-15

## 2019-11-11 MED ORDER — VANCOMYCIN HCL 500 MG IV SOLR
INTRAVENOUS | Status: AC
  Filled 2019-11-11: qty 1000

## 2019-11-11 MED ORDER — VANCOMYCIN HCL 1250 MG/250ML IV SOLN
1250.0000 mg | INTRAVENOUS | Status: DC
  Filled 2019-11-11: qty 250

## 2019-11-11 MED ORDER — ONDANSETRON HCL 4 MG/2ML IJ SOLN
4.0000 mg | Freq: Once | INTRAMUSCULAR | Status: AC
  Administered 2019-11-11: 4 mg via INTRAVENOUS

## 2019-11-11 NOTE — ED Notes (Addendum)
ED Provider at bedside. 

## 2019-11-11 NOTE — ED Provider Notes (Signed)
Called bedside for patient evaluation. Patient is requesting to leave the emergency department. She came in yesterday for abdominal pain, weakness, nausea and was noted to be febrile to 100.4. She states that she is feeling partially improved and wishes to leave. She does not desire to stay for further evaluation. Discussed with patient concerns for sepsis and serious bacterial infection, possible cholecystitis given her labs and imaging. Discussed reasons for staying such as continuing workup as well as continuing IV antibiotics. Patient refuses to stay at this time. She is awake, alert and oriented and able to make these decisions. Discussed that we can provide oral antibiotics but this is a suboptimal treatment and likely inadequate in the setting of sepsis, possible cholecystitis. Discussed that she is at risk for worsening condition, renal failure, organ failure and patient acknowledges these risks. Family members at the bedside for the conversation. Discussed importance of outpatient follow-up as well as return precautions as well.   Tilden Fossa, MD 11/11/19 1226

## 2019-11-11 NOTE — ED Notes (Addendum)
Pt leaving AMA. Risks of leaving have been discussed with patient by this RN. EDP Madilyn Hook also spoke with patient prior to patient leaving. Pt reports feeling better and endorses will follow up with PCP. Pt discharged to home. Discharge instructions have been discussed with patient and/or family members. Pt verbally acknowledges understanding d/c instructions, and endorses comprehension to checkout at registration before leaving.

## 2019-11-11 NOTE — Progress Notes (Signed)
Pharmacy Antibiotic Note  Jill Crane is a 79 y.o. female admitted on 11/10/2019 with general feeling of being unwell, nausea and recent diarrha.  Pharmacy has been consulted for Cefepime and vancomycin dosing for concern of infection from unknown source. Pt with Tmax 100.4 and WBC is elevated at 19.9. SCr down slightly to 0.92. Will convert vancomycin to AUC dosing.   Plan: Continue cefepime 2gm IV Q12H Change vancomycin to 1250mg  IV Q24H F/u renal fxn, C&S, clinical status and peak/trough at SS  Height: 5\' 6"  (167.6 cm) Weight: 79.7 kg (175 lb 9.6 oz) IBW/kg (Calculated) : 59.3  Temp (24hrs), Avg:99.7 F (37.6 C), Min:98.9 F (37.2 C), Max:100.4 F (38 C)  Recent Labs  Lab 11/10/19 1552 11/10/19 1750 11/10/19 1948 11/10/19 2240 11/11/19 0518  WBC 21.5*  --   --   --  19.9*  CREATININE 1.24*  --   --  1.12* 0.92  LATICACIDVEN  --  2.0* 2.4* 1.7  --     Estimated Creatinine Clearance: 53.7 mL/min (by C-G formula based on SCr of 0.92 mg/dL).    No Known Allergies  Antimicrobials this admission: Vanc 4/26 >>  Cefepime 4/26 >>   Dose adjustments this admission:   Microbiology results: 4/26 Blood - NGTD  Thank you for allowing pharmacy to be a part of this patient's care.  5/26, PharmD, BCPS Clinical Pharmacist Please see AMION for all pharmacy numbers 11/11/2019 8:58 AM

## 2019-11-15 LAB — CULTURE, BLOOD (ROUTINE X 2)
Culture: NO GROWTH
Culture: NO GROWTH
Special Requests: ADEQUATE
Special Requests: ADEQUATE

## 2020-08-15 ENCOUNTER — Inpatient Hospital Stay (HOSPITAL_BASED_OUTPATIENT_CLINIC_OR_DEPARTMENT_OTHER)
Admission: EM | Admit: 2020-08-15 | Discharge: 2020-08-17 | DRG: 872 | Disposition: A | Discharge status: Home / Self Care | Payer: Medicare Other | Attending: Family Medicine | Admitting: Family Medicine

## 2020-08-15 ENCOUNTER — Emergency Department (HOSPITAL_BASED_OUTPATIENT_CLINIC_OR_DEPARTMENT_OTHER): Payer: Medicare Other

## 2020-08-15 ENCOUNTER — Encounter (HOSPITAL_BASED_OUTPATIENT_CLINIC_OR_DEPARTMENT_OTHER): Payer: Self-pay | Admitting: Emergency Medicine

## 2020-08-15 ENCOUNTER — Other Ambulatory Visit: Payer: Self-pay

## 2020-08-15 DIAGNOSIS — Z79899 Other long term (current) drug therapy: Secondary | ICD-10-CM

## 2020-08-15 DIAGNOSIS — R6510 Systemic inflammatory response syndrome (SIRS) of non-infectious origin without acute organ dysfunction: Secondary | ICD-10-CM | POA: Insufficient documentation

## 2020-08-15 DIAGNOSIS — M79671 Pain in right foot: Secondary | ICD-10-CM | POA: Diagnosis not present

## 2020-08-15 DIAGNOSIS — I8390 Asymptomatic varicose veins of unspecified lower extremity: Secondary | ICD-10-CM | POA: Diagnosis present

## 2020-08-15 DIAGNOSIS — A4151 Sepsis due to Escherichia coli [E. coli]: Principal | ICD-10-CM | POA: Diagnosis present

## 2020-08-15 DIAGNOSIS — Z86711 Personal history of pulmonary embolism: Secondary | ICD-10-CM

## 2020-08-15 DIAGNOSIS — I110 Hypertensive heart disease with heart failure: Secondary | ICD-10-CM | POA: Diagnosis present

## 2020-08-15 DIAGNOSIS — Z7982 Long term (current) use of aspirin: Secondary | ICD-10-CM

## 2020-08-15 DIAGNOSIS — A419 Sepsis, unspecified organism: Secondary | ICD-10-CM | POA: Diagnosis present

## 2020-08-15 DIAGNOSIS — R6520 Severe sepsis without septic shock: Secondary | ICD-10-CM | POA: Diagnosis present

## 2020-08-15 DIAGNOSIS — N390 Urinary tract infection, site not specified: Secondary | ICD-10-CM | POA: Diagnosis not present

## 2020-08-15 DIAGNOSIS — I4891 Unspecified atrial fibrillation: Secondary | ICD-10-CM | POA: Diagnosis not present

## 2020-08-15 DIAGNOSIS — Z87891 Personal history of nicotine dependence: Secondary | ICD-10-CM

## 2020-08-15 DIAGNOSIS — Z20822 Contact with and (suspected) exposure to covid-19: Secondary | ICD-10-CM | POA: Diagnosis present

## 2020-08-15 DIAGNOSIS — J449 Chronic obstructive pulmonary disease, unspecified: Secondary | ICD-10-CM | POA: Diagnosis present

## 2020-08-15 DIAGNOSIS — I10 Essential (primary) hypertension: Secondary | ICD-10-CM | POA: Diagnosis present

## 2020-08-15 DIAGNOSIS — I5032 Chronic diastolic (congestive) heart failure: Secondary | ICD-10-CM | POA: Diagnosis present

## 2020-08-15 DIAGNOSIS — Z7983 Long term (current) use of bisphosphonates: Secondary | ICD-10-CM

## 2020-08-15 DIAGNOSIS — Z794 Long term (current) use of insulin: Secondary | ICD-10-CM

## 2020-08-15 DIAGNOSIS — Z7901 Long term (current) use of anticoagulants: Secondary | ICD-10-CM

## 2020-08-15 DIAGNOSIS — Z7984 Long term (current) use of oral hypoglycemic drugs: Secondary | ICD-10-CM

## 2020-08-15 DIAGNOSIS — L100 Pemphigus vulgaris: Secondary | ICD-10-CM | POA: Diagnosis present

## 2020-08-15 DIAGNOSIS — H409 Unspecified glaucoma: Secondary | ICD-10-CM | POA: Diagnosis present

## 2020-08-15 DIAGNOSIS — E119 Type 2 diabetes mellitus without complications: Secondary | ICD-10-CM | POA: Diagnosis not present

## 2020-08-15 DIAGNOSIS — I4821 Permanent atrial fibrillation: Secondary | ICD-10-CM | POA: Diagnosis present

## 2020-08-15 DIAGNOSIS — Z86718 Personal history of other venous thrombosis and embolism: Secondary | ICD-10-CM

## 2020-08-15 HISTORY — DX: Systemic inflammatory response syndrome (sirs) of non-infectious origin without acute organ dysfunction: R65.10

## 2020-08-15 LAB — COMPREHENSIVE METABOLIC PANEL
ALT: 35 U/L (ref 0–44)
AST: 35 U/L (ref 15–41)
Albumin: 3.1 g/dL — ABNORMAL LOW (ref 3.5–5.0)
Alkaline Phosphatase: 71 U/L (ref 38–126)
Anion gap: 11 (ref 5–15)
BUN: 20 mg/dL (ref 8–23)
CO2: 21 mmol/L — ABNORMAL LOW (ref 22–32)
Calcium: 8.6 mg/dL — ABNORMAL LOW (ref 8.9–10.3)
Chloride: 103 mmol/L (ref 98–111)
Creatinine, Ser: 1.23 mg/dL — ABNORMAL HIGH (ref 0.44–1.00)
GFR, Estimated: 45 mL/min — ABNORMAL LOW (ref >60)
Glucose, Bld: 132 mg/dL — ABNORMAL HIGH (ref 70–99)
Potassium: 5.3 mmol/L — ABNORMAL HIGH (ref 3.5–5.1)
Sodium: 135 mmol/L (ref 135–145)
Total Bilirubin: 0.4 mg/dL (ref 0.3–1.2)
Total Protein: 6.4 g/dL — ABNORMAL LOW (ref 6.5–8.1)

## 2020-08-15 LAB — URINALYSIS, ROUTINE W REFLEX MICROSCOPIC
Bilirubin Urine: NEGATIVE
Glucose, UA: NEGATIVE mg/dL
Ketones, ur: NEGATIVE mg/dL
Nitrite: POSITIVE — AB
Protein, ur: 100 mg/dL — AB
Specific Gravity, Urine: 1.02 (ref 1.005–1.030)
pH: 6 (ref 5.0–8.0)

## 2020-08-15 LAB — BLOOD CULTURE ID PANEL (REFLEXED) - BCID2

## 2020-08-15 LAB — CBG MONITORING, ED: Glucose-Capillary: 161 mg/dL — ABNORMAL HIGH (ref 70–99)

## 2020-08-15 LAB — CBC WITH DIFFERENTIAL/PLATELET
Abs Immature Granulocytes: 0.07 10*3/uL (ref 0.00–0.07)
Basophils Absolute: 0 10*3/uL (ref 0.0–0.1)
Basophils Relative: 0 %
Eosinophils Absolute: 0 10*3/uL (ref 0.0–0.5)
Eosinophils Relative: 0 %
HCT: 40.9 % (ref 36.0–46.0)
Hemoglobin: 13.4 g/dL (ref 12.0–15.0)
Immature Granulocytes: 1 %
Lymphocytes Relative: 15 %
Lymphs Abs: 2.3 10*3/uL (ref 0.7–4.0)
MCH: 29 pg (ref 26.0–34.0)
MCHC: 32.8 g/dL (ref 30.0–36.0)
MCV: 88.5 fL (ref 80.0–100.0)
Monocytes Absolute: 0.5 10*3/uL (ref 0.1–1.0)
Monocytes Relative: 3 %
Neutro Abs: 12.1 10*3/uL — ABNORMAL HIGH (ref 1.7–7.7)
Neutrophils Relative %: 81 %
Platelets: 131 10*3/uL — ABNORMAL LOW (ref 150–400)
RBC: 4.62 MIL/uL (ref 3.87–5.11)
RDW: 15.9 % — ABNORMAL HIGH (ref 11.5–15.5)
WBC: 15 10*3/uL — ABNORMAL HIGH (ref 4.0–10.5)
nRBC: 0.1 % (ref 0.0–0.2)

## 2020-08-15 LAB — URINALYSIS, MICROSCOPIC (REFLEX)

## 2020-08-15 LAB — RAPID INFLUENZA A&B ANTIGENS
Influenza A (ARMC): NEGATIVE
Influenza B (ARMC): NEGATIVE

## 2020-08-15 LAB — LACTIC ACID, PLASMA
Lactic Acid, Venous: 3.2 mmol/L (ref 0.5–1.9)
Lactic Acid, Venous: 1.3 mmol/L (ref 0.5–1.9)

## 2020-08-15 LAB — SARS CORONAVIRUS 2 BY RT PCR (HOSPITAL ORDER, PERFORMED IN ~~LOC~~ HOSPITAL LAB): SARS Coronavirus 2: NEGATIVE

## 2020-08-15 LAB — PROTIME-INR
INR: 4.1 (ref 0.8–1.2)
Prothrombin Time: 38.7 seconds — ABNORMAL HIGH (ref 11.4–15.2)

## 2020-08-15 LAB — BRAIN NATRIURETIC PEPTIDE: B Natriuretic Peptide: 504.1 pg/mL — ABNORMAL HIGH (ref 0.0–100.0)

## 2020-08-15 LAB — APTT: aPTT: 46 seconds — ABNORMAL HIGH (ref 24–36)

## 2020-08-15 LAB — GLUCOSE, CAPILLARY: Glucose-Capillary: 122 mg/dL — ABNORMAL HIGH (ref 70–99)

## 2020-08-15 MED ORDER — LACTATED RINGERS IV SOLN: INTRAVENOUS | Status: DC

## 2020-08-15 MED ORDER — INSULIN ASPART 100 UNIT/ML ~~LOC~~ SOLN: 0.0000 [IU] | Freq: Every day | SUBCUTANEOUS | Status: DC

## 2020-08-15 MED ORDER — ONDANSETRON HCL 4 MG/2ML IJ SOLN: 4.0000 mg | Freq: Four times a day (QID) | INTRAMUSCULAR | Status: DC | PRN

## 2020-08-15 MED ORDER — PANTOPRAZOLE SODIUM 40 MG PO TBEC
40.0000 mg | DELAYED_RELEASE_TABLET | Freq: Every day | ORAL | Status: DC
  Administered 2020-08-15 – 2020-08-17 (×3): 40 mg via ORAL
  Filled 2020-08-15 (×3): qty 1

## 2020-08-15 MED ORDER — SODIUM CHLORIDE 0.9 % IV SOLN
1.0000 g | INTRAVENOUS | Status: DC
  Filled 2020-08-15: qty 10

## 2020-08-15 MED ORDER — SODIUM CHLORIDE 0.9 % IV SOLN: 2.0000 g | INTRAVENOUS | Status: DC

## 2020-08-15 MED ORDER — ACETAMINOPHEN 650 MG RE SUPP: 650.0000 mg | Freq: Four times a day (QID) | RECTAL | Status: DC | PRN

## 2020-08-15 MED ORDER — DILTIAZEM HCL ER COATED BEADS 240 MG PO CP24
240.0000 mg | ORAL_CAPSULE | Freq: Every day | ORAL | Status: DC
  Administered 2020-08-15 – 2020-08-17 (×3): 240 mg via ORAL
  Filled 2020-08-15: qty 1
  Filled 2020-08-15: qty 2
  Filled 2020-08-15: qty 1

## 2020-08-15 MED ORDER — INSULIN ASPART 100 UNIT/ML ~~LOC~~ SOLN
0.0000 [IU] | Freq: Three times a day (TID) | SUBCUTANEOUS | Status: DC
  Administered 2020-08-16 (×2): 3 [IU] via SUBCUTANEOUS
  Administered 2020-08-17: 2 [IU] via SUBCUTANEOUS

## 2020-08-15 MED ORDER — METRONIDAZOLE IN NACL 5-0.79 MG/ML-% IV SOLN
500.0000 mg | Freq: Once | INTRAVENOUS | Status: AC
  Administered 2020-08-15: 500 mg via INTRAVENOUS
  Filled 2020-08-15: qty 100

## 2020-08-15 MED ORDER — LACTATED RINGERS IV BOLUS
1000.0000 mL | Freq: Once | INTRAVENOUS | Status: AC
  Administered 2020-08-15: 1000 mL via INTRAVENOUS

## 2020-08-15 MED ORDER — PREDNISONE 5 MG PO TABS
5.0000 mg | ORAL_TABLET | Freq: Every day | ORAL | Status: DC
Start: 2020-08-15 — End: 2020-08-17
  Administered 2020-08-15 – 2020-08-17 (×3): 5 mg via ORAL
  Filled 2020-08-15 (×3): qty 1

## 2020-08-15 MED ORDER — ALBUTEROL SULFATE HFA 108 (90 BASE) MCG/ACT IN AERS
2.0000 | INHALATION_SPRAY | Freq: Four times a day (QID) | RESPIRATORY_TRACT | Status: DC | PRN
  Administered 2020-08-15: 2 via RESPIRATORY_TRACT
  Filled 2020-08-15: qty 6.7

## 2020-08-15 MED ORDER — BUSPIRONE HCL 10 MG PO TABS
10.0000 mg | ORAL_TABLET | Freq: Two times a day (BID) | ORAL | Status: DC
  Administered 2020-08-15 – 2020-08-17 (×4): 10 mg via ORAL
  Filled 2020-08-15: qty 2
  Filled 2020-08-15: qty 1
  Filled 2020-08-15: qty 2
  Filled 2020-08-15: qty 1
  Filled 2020-08-15: qty 2

## 2020-08-15 MED ORDER — ACETAMINOPHEN 325 MG PO TABS
650.0000 mg | ORAL_TABLET | Freq: Once | ORAL | Status: AC
  Administered 2020-08-15: 650 mg via ORAL

## 2020-08-15 MED ORDER — METOPROLOL TARTRATE 25 MG PO TABS
25.0000 mg | ORAL_TABLET | Freq: Two times a day (BID) | ORAL | Status: DC
  Administered 2020-08-15 – 2020-08-17 (×5): 25 mg via ORAL
  Filled 2020-08-15 (×5): qty 1

## 2020-08-15 MED ORDER — WARFARIN SODIUM 1 MG PO TABS: 3.0000 mg | ORAL_TABLET | ORAL | Status: DC

## 2020-08-15 MED ORDER — LATANOPROST 0.005 % OP SOLN
1.0000 [drp] | Freq: Every day | OPHTHALMIC | Status: DC
  Administered 2020-08-15 – 2020-08-16 (×2): 1 [drp] via OPHTHALMIC
  Filled 2020-08-15: qty 2.5

## 2020-08-15 MED ORDER — ACETAMINOPHEN 325 MG PO TABS
650.0000 mg | ORAL_TABLET | Freq: Four times a day (QID) | ORAL | Status: DC | PRN
  Administered 2020-08-16 – 2020-08-17 (×4): 650 mg via ORAL
  Filled 2020-08-15 (×4): qty 2

## 2020-08-15 MED ORDER — METFORMIN HCL 500 MG PO TABS
500.0000 mg | ORAL_TABLET | Freq: Two times a day (BID) | ORAL | Status: DC
  Administered 2020-08-15 – 2020-08-16 (×3): 500 mg via ORAL
  Filled 2020-08-15 (×3): qty 1

## 2020-08-15 MED ORDER — SODIUM CHLORIDE 0.9 % IV SOLN
2.0000 g | INTRAVENOUS | Status: DC
  Administered 2020-08-15 – 2020-08-16 (×2): 2 g via INTRAVENOUS
  Filled 2020-08-15 (×2): qty 2

## 2020-08-15 MED ORDER — ASPIRIN 81 MG PO CHEW
81.0000 mg | CHEWABLE_TABLET | Freq: Every day | ORAL | Status: DC
  Administered 2020-08-15 – 2020-08-17 (×3): 81 mg via ORAL
  Filled 2020-08-15 (×3): qty 1

## 2020-08-15 MED ORDER — VANCOMYCIN HCL IN DEXTROSE 1-5 GM/200ML-% IV SOLN
1000.0000 mg | Freq: Once | INTRAVENOUS | Status: AC
  Administered 2020-08-15: 1000 mg via INTRAVENOUS
  Filled 2020-08-15: qty 200

## 2020-08-15 MED ORDER — SODIUM CHLORIDE 0.9 % IV SOLN
2.0000 g | Freq: Once | INTRAVENOUS | Status: AC
  Administered 2020-08-15: 2 g via INTRAVENOUS
  Filled 2020-08-15: qty 2

## 2020-08-15 MED ORDER — BRIMONIDINE TARTRATE 0.2 % OP SOLN
1.0000 [drp] | Freq: Three times a day (TID) | OPHTHALMIC | Status: DC
  Administered 2020-08-15 – 2020-08-17 (×7): 1 [drp] via OPHTHALMIC
  Filled 2020-08-15 (×2): qty 5

## 2020-08-15 MED ORDER — VANCOMYCIN HCL 750 MG/150ML IV SOLN
750.0000 mg | INTRAVENOUS | Status: DC
  Filled 2020-08-15: qty 150

## 2020-08-15 MED ORDER — ONDANSETRON HCL 4 MG PO TABS: 4.0000 mg | ORAL_TABLET | Freq: Four times a day (QID) | ORAL | Status: DC | PRN

## 2020-08-15 MED ORDER — ACETAMINOPHEN 325 MG PO TABS
ORAL_TABLET | ORAL | Status: AC
  Filled 2020-08-15: qty 2

## 2020-08-15 MED ORDER — WARFARIN - PHYSICIAN DOSING INPATIENT
Freq: Every day | Status: DC
  Filled 2020-08-15: qty 1

## 2020-08-15 MED ORDER — SODIUM CHLORIDE 0.9 % IV SOLN
2.0000 g | Freq: Two times a day (BID) | INTRAVENOUS | Status: DC
  Filled 2020-08-15: qty 2

## 2020-08-15 NOTE — Progress Notes (Signed)
Pt being followed by [facility] for Sepsis protocol. 

## 2020-08-15 NOTE — ED Provider Notes (Addendum)
[facility] EMERGENCY DEPARTMENT Provider Note   CSN: [id number] Arrival date & time: 08/15/20  2841     History Chief Complaint  Patient presents with  . Foot Pain    Jill Crane is a 80 y.o. female.  Patient comes to the emergency department, accompanied by her granddaughter.  Patient reportedly has been off balance and feeling weak tonight.  She thought she was "anxious", but granddaughter reports that she was actually shivering.  They have noticed that her right second toe is darker in color than it usually has.  She denies any injury.  She has not had any cough or URI symptoms.  She has had both Covid vaccinations and a booster.  Patient denies chest pain, shortness of breath, abdominal pain, nausea, vomiting, diarrhea, urinary symptoms.        Past Medical History:  Diagnosis Date  . Atrial fibrillation with normal ventricular rate (HCC)   . CHF (congestive heart failure) (HCC)   . COPD (chronic obstructive pulmonary disease) (HCC)   . Diabetes mellitus without complication (HCC)   . Hx of blood clots   . Hypertension     Patient Active Problem List   Diagnosis Date Noted  . Sepsis (HCC) 11/10/2019    History reviewed. No pertinent surgical history.   OB History   No obstetric history on file.     History reviewed. No pertinent family history.  Social History   Tobacco Use  . Smoking status: Former Games developer  . Smokeless tobacco: Never Used  Substance Use Topics  . Alcohol use: No    Home Medications Prior to Admission medications   Medication Sig Start Date End Date Taking? Authorizing Provider  bimatoprost (LUMIGAN) 0.01 % SOLN Place 1 drop into both eyes nightly. 11/04/19  Yes [provider]  brimonidine (ALPHAGAN) 0.2 % ophthalmic solution INSTILL 1 DROP INTO THE  LEFT EYE TWICE DAILY 10/21/19  Yes [provider]  calcium-vitamin D (OSCAL WITH D) 500-200 MG-UNIT per tablet Take 1 tablet by mouth.   Yes [provider]  furosemide (LASIX) 20 MG tablet Take 40 mg (2 tablets) tomorrow and the day after, then continue with 20 mg daily 04/15/17  Yes Harris, Abigail, PA-C  metFORMIN (GLUCOPHAGE) 500 MG tablet Take by mouth 2 (two) times daily with a meal.   Yes [provider]  predniSONE (DELTASONE) 10 MG tablet Take 5 mg by mouth daily with breakfast.   Yes [provider]  warfarin (COUMADIN) 6 MG tablet Take 3 mg by mouth as directed.    Yes [provider]  albuterol (PROVENTIL HFA;VENTOLIN HFA) 108 (90 BASE) MCG/ACT inhaler Inhale 2 puffs into the lungs every 6 (six) hours as needed for wheezing or shortness of breath.    [provider]  alendronate (FOSAMAX) 35 MG tablet Take 35 mg by mouth every 7 (seven) days. Take with a full glass of water on an empty stomach.    [provider]  aspirin 81 MG chewable tablet Chew 81 mg by mouth daily.    [provider]  busPIRone (BUSPAR) 5 MG tablet Take 5 mg by mouth 3 (three) times daily.    [provider]  cefdinir (OMNICEF) 300 MG capsule Take 1 capsule (300 mg total) by mouth 2 (two) times daily. 11/11/19   Tilden Fossa, MD  DILTIAZEM HCL PO Take 240 mg by mouth. Pt is not sure if she is still on this med. Does not have an updated med  list    [provider]  ipratropium-albuterol (DUONEB) 0.5-2.5 (3) MG/3ML SOLN Take 3 mLs by nebulization.    [provider]  metoprolol succinate (TOPROL-XL) 25 MG 24 hr tablet Take 25 mg by mouth daily.    [provider]  metoprolol tartrate (LOPRESSOR) 25 MG tablet Take 25 mg by mouth 2 (two) times daily.    [provider]  metroNIDAZOLE (FLAGYL) 500 MG tablet Take 1 tablet (500 mg total) by mouth 3 (three) times daily. 11/11/19   Tilden Fossa, MD  omeprazole (PRILOSEC) 20 MG capsule Take 20 mg by mouth daily.    [provider]    Allergies    Patient has no known allergies.  Review of Systems    Review of Systems  Constitutional: Positive for chills and fatigue.  Skin: Positive for color change.  All other systems reviewed and are negative.   Physical Exam Updated Vital Signs BP 127/63   Pulse 96   Temp (!) 103 F (39.4 C) (Oral)   Resp (!) 30   Ht 5\' 5"  (1.651 m)   Wt 77.1 kg   SpO2 94%   BMI 28.29 kg/m   Physical Exam Vitals and nursing note reviewed.  Constitutional:      General: She is not in acute distress.    Appearance: Normal appearance. She is well-developed and well-nourished.  HENT:     Head: Normocephalic and atraumatic.     Right Ear: Hearing normal.     Left Ear: Hearing normal.     Nose: Nose normal.     Mouth/Throat:     Mouth: Oropharynx is clear and moist and mucous membranes are normal.  Eyes:     Extraocular Movements: EOM normal.     Conjunctiva/sclera: Conjunctivae normal.     Pupils: Pupils are equal, round, and reactive to light.  Cardiovascular:     Rate and Rhythm: Tachycardia present. Rhythm irregularly irregular.     Heart sounds: S1 normal and S2 normal. No murmur heard. No friction rub. No gallop.   Pulmonary:     Effort: Pulmonary effort is normal. No respiratory distress.     Breath sounds: Normal breath sounds.  Chest:     Chest wall: No tenderness.  Abdominal:     General: Bowel sounds are normal.     Palpations: Abdomen is soft. There is no hepatosplenomegaly.     Tenderness: There is no abdominal tenderness. There is no guarding or rebound. Negative signs include Murphy's sign and McBurney's sign.     Hernia: No hernia is present.  Musculoskeletal:        General: Normal range of motion.     Cervical back: Normal range of motion and neck supple.  Skin:    General: Skin is warm, dry and intact.     Findings: No rash.     Nails: There is no cyanosis.     Comments: Right second toe darker in color than other toes, no swelling or warmth  Neurological:     Mental Status: She is alert and oriented to person, place,  and time.     GCS: GCS eye subscore is 4. GCS verbal subscore is 5. GCS motor subscore is 6.     Cranial Nerves: No cranial nerve deficit.     Sensory: No sensory deficit.     Coordination: Coordination normal.     Deep Tendon Reflexes: Strength normal.  Psychiatric:        Mood and Affect: Mood  and affect normal.        Speech: Speech normal.        Behavior: Behavior normal.        Thought Content: Thought content normal.       ED Results / Procedures / Treatments   Labs (all labs ordered are listed, but only abnormal results are displayed) Labs Reviewed  LACTIC ACID, PLASMA - Abnormal; Notable for the following components:      Result Value   Lactic Acid, Venous 3.2 (*)    All other components within normal limits  COMPREHENSIVE METABOLIC PANEL - Abnormal; Notable for the following components:   Potassium 5.3 (*)    CO2 21 (*)    Glucose, Bld 132 (*)    Creatinine, Ser 1.23 (*)    Calcium 8.6 (*)    Total Protein 6.4 (*)    Albumin 3.1 (*)    GFR, Estimated 45 (*)    All other components within normal limits  CBC WITH DIFFERENTIAL/PLATELET - Abnormal; Notable for the following components:   WBC 15.0 (*)    RDW 15.9 (*)    Platelets 131 (*)    Neutro Abs 12.1 (*)    All other components within normal limits  PROTIME-INR - Abnormal; Notable for the following components:   Prothrombin Time 38.7 (*)    INR 4.1 (*)    All other components within normal limits  APTT - Abnormal; Notable for the following components:   aPTT 46 (*)    All other components within normal limits  BRAIN NATRIURETIC PEPTIDE - Abnormal; Notable for the following components:   B Natriuretic Peptide 504.1 (*)    All other components within normal limits  SARS CORONAVIRUS 2 BY RT PCR (HOSPITAL ORDER, PERFORMED IN [facility])  RAPID INFLUENZA A&B ANTIGENS  CULTURE, BLOOD (SINGLE)  URINE CULTURE  CULTURE, BLOOD (SINGLE)  LACTIC ACID, PLASMA  URINALYSIS, ROUTINE W REFLEX  MICROSCOPIC    EKG EKG Interpretation  Date/Time:  Sunday August 15 2020 04:22:52 EST Ventricular Rate:  106 PR Interval:    QRS Duration: 80 QT Interval:  298 QTC Calculation: 357 R Axis:   126 Text Interpretation: Atrial fibrillation Right axis deviation No significant change since last tracing Confirmed by Gilda Crease [phone]) on 08/15/2020 4:32:42 AM   Radiology DG Chest Port 1 View  Result Date: 08/15/2020 CLINICAL DATA:  Questionable sepsis EXAM: PORTABLE CHEST 1 VIEW COMPARISON:  11/10/2019 FINDINGS: Cardiomegaly with prominent left atrial contour. Symmetric generalized prominence of interstitial markings. No Kerley lines, effusion, or focal consolidation. IMPRESSION: Cardiomegaly and mild vascular congestion. Electronically Signed   By: Marnee Spring M.D.   On: 08/15/2020 04:36    Procedures Procedures   Medications Ordered in ED Medications  lactated ringers infusion ( Intravenous New Bag/Given 08/15/20 0511)  ceFEPIme (MAXIPIME) 2 g in sodium chloride 0.9 % 100 mL IVPB (2 g Intravenous New Bag/Given 08/15/20 0519)  metroNIDAZOLE (FLAGYL) IVPB 500 mg (has no administration in time range)  vancomycin (VANCOCIN) IVPB 1000 mg/200 mL premix (has no administration in time range)  acetaminophen (TYLENOL) tablet 650 mg (650 mg Oral Given 08/15/20 0430)  lactated ringers bolus 1,000 mL (1,000 mLs Intravenous New Bag/Given 08/15/20 0511)    ED Course  I have reviewed the triage vital signs and the nursing notes.  Pertinent labs & imaging results that were available during my care of the patient were reviewed by me and considered in my medical decision making (see chart for  details).    MDM Rules/Calculators/A&P                          Patient presents to the emergency department for evaluation of feeling shaky and generalized weakness.  She is found to be febrile on arrival with a temperature of 103 Fahrenheit.  She did not know she had a fever but she was  experiencing rigors at home previously.  Patient does not have any clear symptoms to explain the fever.  No URI symptoms or urinary symptoms.  Abdominal exam is benign.  Patient is triple vaccinated for Covid and her Covid PCR was negative.  Influenza was negative.  At arrival she was mildly tachycardic and tachypneic.  Chest x-ray does not show an obvious pneumonia.  Lactic acid and white blood cell count are elevated, at which point it was felt that she is septic from an unknown source and was initiated on broad-spectrum antibiotics.  She has not been able to give [place] a urine sample yet but one will be obtained and cultured.  Patient does have some discoloration of her right second toe.  There is no erythema or swelling.  The area is not warm to the touch.  It appears to be darker than the other skin pigmentation but not bruised.  She does have a history of atrial fibrillation but has good pulses.  She has good capillary refill distally.  Doubt ischemia of the toe based on her current exam and the fact that her INR is supratherapeutic above 4.  Patient being administered IV fluid bolus and will require hospitalization for further management. Final Clinical Impression(s) / ED Diagnoses Final diagnoses:  Sepsis without acute organ dysfunction, due to unspecified organism [facility])    Rx / DC Orders ED Discharge Orders    None       Armondo Cech, Canary Brim, MD 08/15/20 0540    Gilda Crease, MD 08/15/20 [phone]

## 2020-08-15 NOTE — ED Notes (Signed)
Pt ambulated to bedside commode without difficulty 

## 2020-08-15 NOTE — ED Notes (Signed)
Report given to [facility]. 

## 2020-08-15 NOTE — ED Triage Notes (Signed)
Right foot pain, has been "off balance" per family at bedside. Right second toe dark in color, cap refill >5 seconds. Febrile in triage.

## 2020-08-15 NOTE — Progress Notes (Signed)
Pharmacy Antibiotic Note  Jill Crane is a 80 y.o. female admitted on 08/15/2020 with fevers, possible sepsis.  Pharmacy has been consulted for Vancomycin and Cefepime dosing.  Vancomycin 1 g IV given in ED at  0630  Plan: Vancomycin 750 mg IV q24h Cefepime 2 g IV q12h  Height: 5\' 5"  (165.1 cm) Weight: 77.1 kg (170 lb) IBW/kg (Calculated) : 57  Temp (24hrs), Avg:101.1 F (38.4 C), Min:99.9 F (37.7 C), Max:103 F (39.4 C)  Recent Labs  Lab 08/15/20 0422 08/15/20 0642  WBC 15.0*  --   CREATININE 1.23*  --   LATICACIDVEN 3.2* 1.3    Estimated Creatinine Clearance: 38.7 mL/min (A) (by C-G formula based on SCr of 1.23 mg/dL (H)).    No Known Allergies   08/17/20 08/15/2020 7:45 AM

## 2020-08-15 NOTE — Progress Notes (Signed)
ANTICOAGULATION CONSULT NOTE - Initial Consult  Pharmacy Consult for warfarin Indication: atrial fibrillation  No Known Allergies  Patient Measurements: Height: 5\' 5"  (165.1 cm) Weight: 77.1 kg (170 lb) IBW/kg (Calculated) : 57   Vital Signs: Temp: 97.8 F (36.6 C) (01/30 2231) Temp Source: Oral (01/30 2231) BP: 129/73 (01/30 2231) Pulse Rate: 84 (01/30 2231)  Labs: Recent Labs    08/15/20 0422  HGB 13.4  HCT 40.9  PLT 131*  APTT 46*  LABPROT 38.7*  INR 4.1*  CREATININE 1.23*    Estimated Creatinine Clearance: 38.7 mL/min (A) (by C-G formula based on SCr of 1.23 mg/dL (H)).   Medical History: Past Medical History:  Diagnosis Date  . Atrial fibrillation with normal ventricular rate (HCC)   . CHF (congestive heart failure) (HCC)   . COPD (chronic obstructive pulmonary disease) (HCC)   . Diabetes mellitus without complication (HCC)   . Hx of blood clots   . Hypertension       Assessment: 80 y.o. female with medical history significant of diabetes, atrial fibrillation, diastolic dysfunction CHF, COPD, history of venous thrombi embolism and essential hypertension who was seen at [facility] initially complaining of foot pain.  Pharmacy consulted to dose warfarin.  Home dose warfarin 2mg  daily except 4mg  on Mon & Fri.  LD 1/29 in the evening  08/15/2020 INR 4.1 supratherapeutic Hgb 13.4 Ptls 131  Goal of Therapy:  INR 2-3   Plan:  Hold warfarin tonight Daily INR  Thu RPh 08/15/2020, 10:43 PM

## 2020-08-15 NOTE — ED Notes (Signed)
Attempted to give report to floor nurse, unavailable at this time

## 2020-08-15 NOTE — ED Notes (Signed)
Lab called a lactic acid of 3.2 and an INR of 4.1. Dr. Blinda Leatherwood aware.

## 2020-08-15 NOTE — ED Notes (Signed)
Pt resting on stretcher. No complaints of pain. Family at bedsdie.

## 2020-08-15 NOTE — ED Notes (Signed)
ED Provider at bedside. 

## 2020-08-15 NOTE — Progress Notes (Signed)
PHARMACY - PHYSICIAN COMMUNICATION CRITICAL VALUE ALERT - BLOOD CULTURE IDENTIFICATION (BCID)  Jill Crane is an 80 y.o. female who presented to [facility] on 08/15/2020 with a chief complaint of foot pain and fever.  She was started on vancomycin and cefepime on admission for sepsis.  MD then change cefepime to ceftriaxone. 2 of 4 blood culture have GNR (BCID= Ecoli)   Name of physician (or Provider) Contacted: Dr. Mikeal Hawthorne  Current antibiotics: vancomycin and ceftriaxone  Changes to prescribed antibiotics recommended:  - increased ceftriaxone dose to 2gm q24h - ok with Dr. Mikeal Hawthorne to d/c vancomycin  Results for orders placed or performed during the hospital encounter of 08/15/20  Blood Culture ID Panel (Reflexed) (Collected: 08/15/2020  4:22 AM)  Result Value Ref Range   Enterococcus faecalis NOT DETECTED NOT DETECTED   Enterococcus Faecium NOT DETECTED NOT DETECTED   Listeria monocytogenes NOT DETECTED NOT DETECTED   Staphylococcus species NOT DETECTED NOT DETECTED   Staphylococcus aureus (BCID) NOT DETECTED NOT DETECTED   Staphylococcus epidermidis NOT DETECTED NOT DETECTED   Staphylococcus lugdunensis NOT DETECTED NOT DETECTED   Streptococcus species NOT DETECTED NOT DETECTED   Streptococcus agalactiae NOT DETECTED NOT DETECTED   Streptococcus pneumoniae NOT DETECTED NOT DETECTED   Streptococcus pyogenes NOT DETECTED NOT DETECTED   A.calcoaceticus-baumannii NOT DETECTED NOT DETECTED   Bacteroides fragilis NOT DETECTED NOT DETECTED   Enterobacterales DETECTED (A) NOT DETECTED   Enterobacter cloacae complex NOT DETECTED NOT DETECTED   Escherichia coli DETECTED (A) NOT DETECTED   Klebsiella aerogenes NOT DETECTED NOT DETECTED   Klebsiella oxytoca NOT DETECTED NOT DETECTED   Klebsiella pneumoniae NOT DETECTED NOT DETECTED   Proteus species NOT DETECTED NOT DETECTED   Salmonella species NOT DETECTED NOT DETECTED   Serratia marcescens NOT DETECTED NOT DETECTED   Haemophilus  influenzae NOT DETECTED NOT DETECTED   Neisseria meningitidis NOT DETECTED NOT DETECTED   Pseudomonas aeruginosa NOT DETECTED NOT DETECTED   Stenotrophomonas maltophilia NOT DETECTED NOT DETECTED   Candida albicans NOT DETECTED NOT DETECTED   Candida auris NOT DETECTED NOT DETECTED   Candida glabrata NOT DETECTED NOT DETECTED   Candida krusei NOT DETECTED NOT DETECTED   Candida parapsilosis NOT DETECTED NOT DETECTED   Candida tropicalis NOT DETECTED NOT DETECTED   Cryptococcus neoformans/gattii NOT DETECTED NOT DETECTED   CTX-M ESBL NOT DETECTED NOT DETECTED   Carbapenem resistance IMP NOT DETECTED NOT DETECTED   Carbapenem resistance KPC NOT DETECTED NOT DETECTED   Carbapenem resistance NDM NOT DETECTED NOT DETECTED   Carbapenem resist OXA 48 LIKE NOT DETECTED NOT DETECTED   Carbapenem resistance VIM NOT DETECTED NOT DETECTED    Arlester Marker, Danese Dorsainvil P 08/15/2020  8:06 PM

## 2020-08-15 NOTE — H&P (Signed)
History and Physical   Jill Crane ZOX:[id number] DOB: [birth year] DOA: 08/15/2020  Referring MD/NP/PA: Dr. Blinda Crane  PCP: Jill Shutter, MD   Outpatient Specialists: None  [facility]  Chief Complaint: Fever  HPI: Jill Crane is a 80 y.o. female with medical history significant of diabetes, atrial fibrillation, diastolic dysfunction CHF, COPD, history of venous thrombi embolism and essential hypertension who was seen at [facility] initially complaining of foot pain.  She was with granddaughter who noticed patient was shaky.  She did have right second toe discoloration.  Patient was screened for Covid.  She was fully vaccinated.  In the process of treatment she was noted to have UTI.  Patient also noted to have evidence of SIRS with sepsis.  She was transferred prior to more discomfort further treatment.  She was initiated on IV Rocephin for UTI related sepsis.  On arrival patient was noted to have positive Crane cultures now growing E. coli.  She is therefore being admitted with sepsis due to E. Coli UTI.  ED Course: Temperature 103 Crane pressure 137/59, pulse 122 respirate 34 oxygen sat 94% room air.  Sodium 135 potassium 5.3.  CO2 21 glucose 132 BUN 20 creatinine 1.23.  BNP 504 lactic acid 3.2 white count 15.0.  COVID-19 screen is negative PT 38.7 INR 4.1.  Urinalysis showed nitrite positive leukocyte positive WBC 11-20 with many bacteria.  Crane culture positive with E. coli detected by [facility].  Patient is therefore being admitted with sepsis due to UTI by E. coli  Review of Systems: As per HPI otherwise 10 point review of systems negative.    Past Medical History:  Diagnosis Date  . Atrial fibrillation with normal ventricular rate (HCC)   . CHF (congestive heart failure) (HCC)   . COPD (chronic obstructive pulmonary disease) (HCC)   . Diabetes mellitus without complication (HCC)   . Hx of Crane clots   . Hypertension      History reviewed. No pertinent surgical history.   reports that she has quit smoking. She has never used smokeless tobacco. She reports that she does not drink alcohol. No history on file for drug use.  No Known Allergies  History reviewed. No pertinent family history.   Prior to Admission medications   Medication Sig Start Date End Date Taking? Authorizing Provider  alendronate (FOSAMAX) 35 MG tablet Take 35 mg by mouth every 7 (seven) days. Take with a full glass of water on an empty stomach.   Yes [provider]  aspirin 81 MG chewable tablet Chew 81 mg by mouth daily.   Yes [provider]  bimatoprost (LUMIGAN) 0.01 % SOLN Place 1 drop into both eyes at bedtime. 11/04/19  Yes [provider]  brimonidine (ALPHAGAN) 0.2 % ophthalmic solution Place 1 drop into the left eye in the morning and at bedtime. 10/21/19  Yes [provider]  busPIRone (BUSPAR) 10 MG tablet Take 10 mg by mouth 2 (two) times daily. 08/04/20  Yes [provider]  calcium-vitamin D (OSCAL WITH D) 500-200 MG-UNIT per tablet Take 1 tablet by mouth.   Yes [provider]  dorzolamide (TRUSOPT) 2 % ophthalmic solution Place 1 drop into both eyes at bedtime. 08/03/20  Yes [provider]  furosemide (LASIX) 20 MG tablet Take 40 mg (2 tablets) tomorrow and the day after, then continue with 20 mg daily Patient taking differently: Take 20 mg by mouth daily. 04/15/17  Yes Crane, Abigail, PA-C  gabapentin (NEURONTIN) 300 MG capsule Take 300 mg by mouth at bedtime. 08/10/20  Yes [provider]  JANUMET 50-1000 MG tablet Take 1 tablet by mouth 2 (two) times daily. 07/08/20  Yes [provider]  magnesium oxide (MAG-OX) 400 (241.3 Mg) MG tablet Take 1 tablet by mouth daily. 07/08/20  Yes [provider]  MATZIM LA 240 MG 24 hr tablet Take 240 mg by mouth daily. 08/03/20  Yes [provider]  omeprazole (PRILOSEC) 20 MG capsule Take  20 mg by mouth daily.   Yes [provider]  predniSONE (DELTASONE) 5 MG tablet Take 10 mg by mouth every morning. 08/04/20  Yes [provider]  albuterol (PROVENTIL HFA;VENTOLIN HFA) 108 (90 BASE) MCG/ACT inhaler Inhale 2 puffs into the lungs every 6 (six) hours as needed for wheezing or shortness of breath.    [provider]  ipratropium-albuterol (DUONEB) 0.5-2.5 (3) MG/3ML SOLN Take 3 mLs by nebulization every 6 (six) hours as needed (sob/wheezing).    [provider]  SSD 1 % cream Apply topically daily. 06/19/20   [provider]  warfarin (COUMADIN) 2 MG tablet Take 2-4 mg by mouth as directed. Take 1 tablet (2 mg) daily except Take 2 tablets (4 mg) on MWF. 06/02/20   [provider]  warfarin (COUMADIN) 6 MG tablet Take 3 mg by mouth as directed.     [provider]    Physical Exam: Vitals:   08/15/20 1500 08/15/20 1630 08/15/20 1700 08/15/20 1831  BP: 121/69 (!) 134/58 (!) 121/55 120/79  Pulse: 71 72 60 67  Resp: 20 20 20 20   Temp:    98.7 F (37.1 C)  TempSrc:    Oral  SpO2: 97% 94% 95% 96%  Weight:      Height:          Constitutional: Acutely ill looking no distress  Vitals:   08/15/20 1500 08/15/20 1630 08/15/20 1700 08/15/20 1831  BP: 121/69 (!) 134/58 (!) 121/55 120/79  Pulse: 71 72 60 67  Resp: 20 20 20 20   Temp:    98.7 F (37.1 C)  TempSrc:    Oral  SpO2: 97% 94% 95% 96%  Weight:      Height:       Eyes: PERRL, lids and conjunctivae normal ENMT: Mucous membranes are moist. Posterior pharynx clear of any exudate or lesions.Normal dentition.  Neck: normal, supple, no masses, no thyromegaly Respiratory: clear to auscultation bilaterally, no wheezing, no crackles. Normal respiratory effort. No accessory muscle use.  Cardiovascular: Irregularly irregular rate and rhythm, no murmurs / rubs / gallops. No extremity edema. 2+ pedal pulses. No carotid bruits.  Abdomen: no tenderness, no masses palpated.  No hepatosplenomegaly. Bowel sounds positive.  Musculoskeletal: no clubbing / cyanosis. No joint deformity upper and lower extremities. Good ROM, no contractures. Normal muscle tone.  Slight redness of the toe Skin: no rashes, lesions, ulcers. No induration Neurologic: CN 2-12 grossly intact. Sensation intact, DTR normal. Strength 5/5 in all 4.  Psychiatric: Normal judgment and insight. Alert and oriented x 3. Normal mood.     Labs on Admission: I have personally reviewed following labs and imaging studies  CBC: Recent Labs  Lab 08/15/20 0422  WBC 15.0*  NEUTROABS 12.1*  HGB 13.4  HCT 40.9  MCV 88.5  PLT 131*   Basic Metabolic Panel: Recent Labs  Lab 08/15/20 0422  NA 135  K 5.3*  CL 103  CO2 21*  GLUCOSE 132*  BUN 20  CREATININE 1.23*  CALCIUM 8.6*   GFR: Estimated Creatinine Clearance: 38.7 mL/min (A) (by C-G formula based on SCr of 1.23 mg/dL (H)). Liver Function Tests: Recent Labs  Lab 08/15/20 0422  AST 35  ALT 35  ALKPHOS 71  BILITOT 0.4  PROT 6.4*  ALBUMIN 3.1*   No results for input(s): LIPASE, AMYLASE in the last 168 hours. No results for input(s): AMMONIA in the last 168 hours. Coagulation Profile: Recent Labs  Lab 08/15/20 0422  INR 4.1*   Cardiac Enzymes: No results for input(s): CKTOTAL, CKMB, CKMBINDEX, TROPONINI in the last 168 hours. BNP (last 3 results) No results for input(s): PROBNP in the last 8760 hours. HbA1C: No results for input(s): HGBA1C in the last 72 hours. CBG: Recent Labs  Lab 08/15/20 0737  GLUCAP 161*   Lipid Profile: No results for input(s): CHOL, HDL, LDLCALC, TRIG, CHOLHDL, LDLDIRECT in the last 72 hours. Thyroid Function Tests: No results for input(s): TSH, T4TOTAL, FREET4, T3FREE, THYROIDAB in the last 72 hours. Anemia Panel: No results for input(s): VITAMINB12, FOLATE, FERRITIN, TIBC, IRON, RETICCTPCT in the last 72 hours. Urine analysis:    Component Value Date/Time   COLORURINE YELLOW 08/15/2020 0415    APPEARANCEUR HAZY (A) 08/15/2020 0415   LABSPEC 1.020 08/15/2020 0415   PHURINE 6.0 08/15/2020 0415   GLUCOSEU NEGATIVE 08/15/2020 0415   HGBUR MODERATE (A) 08/15/2020 0415   BILIRUBINUR NEGATIVE 08/15/2020 0415   KETONESUR NEGATIVE 08/15/2020 0415   PROTEINUR 100 (A) 08/15/2020 0415   UROBILINOGEN 1.0 08/16/2014 0423   NITRITE POSITIVE (A) 08/15/2020 0415   LEUKOCYTESUR SMALL (A) 08/15/2020 0415   Sepsis Labs: @LABRCNTIP (procalcitonin:4,lacticidven:4) ) Recent Results (from the past 240 hour(s))  Crane culture (routine single)     Status: None (Preliminary result)   Collection Time: 08/15/20  4:22 AM   Specimen: Crane RIGHT FOREARM  Result Value Ref Range Status   Specimen Description   Final    Crane RIGHT FOREARM Performed at [facility], [address] [place], [place]    Special Requests   Final    BOTTLES DRAWN AEROBIC AND ANAEROBIC Crane Culture adequate volume Performed at [facility], [address] [place], [place]    Culture  Setup Time   Final    GRAM NEGATIVE RODS IN BOTH AEROBIC AND ANAEROBIC BOTTLES Organism ID to follow Performed at [facility], [address], [place], [place]    Culture PENDING  Incomplete   Report Status PENDING  Incomplete  SARS Coronavirus 2 by RT PCR (hospital order, performed in [facility] hospital lab) Nasopharyngeal Nasopharyngeal Swab     Status: None   Collection Time: 08/15/20  4:22 AM   Specimen: Nasopharyngeal Swab  Result Value Ref Range Status   SARS Coronavirus 2 NEGATIVE NEGATIVE Final    Comment: (NOTE) SARS-CoV-2 target nucleic acids are NOT DETECTED.  The SARS-CoV-2 RNA is generally detectable in upper and lower respiratory specimens during the acute phase of infection. The lowest concentration of SARS-CoV-2 viral copies this assay can detect is 250 copies / mL. A negative result does not preclude SARS-CoV-2 infection and should not be used as the  sole basis for treatment or other patient management decisions.  A negative result may occur with improper specimen collection / handling, submission of specimen other than nasopharyngeal swab, presence of viral mutation(s) within the areas targeted by this assay, and inadequate number of viral  copies (<250 copies / mL). A negative result must be combined with clinical observations, patient history, and epidemiological information.  Fact Sheet for Patients:   BoilerBrush.com.cy  Fact Sheet for Healthcare Providers: https://pope.com/  This test is not yet approved or  cleared by the [place] FDA and has been authorized for detection and/or diagnosis of SARS-CoV-2 by FDA under an Emergency Use Authorization (EUA).  This EUA will remain in effect (meaning this test can be used) for the duration of the COVID-19 declaration under Section 564(b)(1) of the Act, 21 U.S.C. section 360bbb-3(b)(1), unless the authorization is terminated or revoked sooner.  Performed at [facility], [address] [place], [place] [address]   Rapid Influenza A&B Antigens     Status: None   Collection Time: 08/15/20  4:22 AM   Specimen: Respiratory  Result Value Ref Range Status   Influenza A ([facility]) NEGATIVE NEGATIVE Final   Influenza B ([facility]) NEGATIVE NEGATIVE Final    Comment: Negative results do not exclude influenza virus infection, and influenza should still be considered if clinical suspicion is high. Performed at [facility], [address] [place], [place] [address]      Radiological Exams on Admission: DG Chest [facility] 1 View  Result Date: 08/15/2020 CLINICAL DATA:  Questionable sepsis EXAM: PORTABLE CHEST 1 VIEW COMPARISON:  11/10/2019 FINDINGS: Cardiomegaly with prominent left atrial contour. Symmetric generalized prominence of interstitial markings. No Kerley lines, effusion, or focal consolidation. IMPRESSION:  Cardiomegaly and mild vascular congestion. Electronically Signed   By: Marnee Spring M.D.   On: 08/15/2020 04:36      Assessment/Plan Principal Problem:   Sepsis (HCC) Active Problems:   Acute lower UTI   A-fib (HCC)   Diabetes (HCC)   Essential hypertension     #1 E. coli sepsis: Most likely secondary to significant pyelonephritis and bacteremia.  Patient admitted.  Empirically on Rocephin 2 g daily.  She appears to be improving.  Continue supportive care and wait for both urine and Crane cultures to finalize.  #2 UTI: Continue treatment.  #3 diabetes: Sliding scale insulin with close monitoring  #4 essential hypertension: Continue home Crane pressure medications  #5 history of atrial fibrillation: Continue close monitoring   DVT prophylaxis: On warfarin Code Status: Full code Family Communication: Granddaughter in the room Disposition Plan: Home Consults called: None Admission status: Inpatient  Severity of Illness: The appropriate patient status for this patient is INPATIENT. Inpatient status is judged to be reasonable and necessary in order to provide the required intensity of service to ensure the patient's safety. The patient's presenting symptoms, physical exam findings, and initial radiographic and laboratory data in the context of their chronic comorbidities is felt to place them at high risk for further clinical deterioration. Furthermore, it is not anticipated that the patient will be medically stable for discharge from the hospital within 2 midnights of admission. The following factors support the patient status of inpatient.   " The patient's presenting symptoms include fever and chills. " The worrisome physical exam findings include stable no distress. " The initial radiographic and laboratory data are worrisome because of evidence of sepsis. " The chronic co-morbidities include diabetes.   * I certify that at the point of admission it is my clinical  judgment that the patient will require inpatient hospital care spanning beyond 2 midnights from the point of admission due to high intensity of service, high risk for further deterioration and high frequency of surveillance required.*  Jill Blood MD [facility] Pager [phone]  If 7PM-7AM, please contact night-coverage [email or web address] Password [email or web address]  08/15/2020, 6:48 PM

## 2020-08-16 DIAGNOSIS — I8390 Asymptomatic varicose veins of unspecified lower extremity: Secondary | ICD-10-CM | POA: Diagnosis present

## 2020-08-16 DIAGNOSIS — Z7901 Long term (current) use of anticoagulants: Secondary | ICD-10-CM | POA: Diagnosis not present

## 2020-08-16 DIAGNOSIS — J449 Chronic obstructive pulmonary disease, unspecified: Secondary | ICD-10-CM | POA: Diagnosis present

## 2020-08-16 DIAGNOSIS — I4891 Unspecified atrial fibrillation: Secondary | ICD-10-CM | POA: Diagnosis present

## 2020-08-16 DIAGNOSIS — Z7982 Long term (current) use of aspirin: Secondary | ICD-10-CM | POA: Diagnosis not present

## 2020-08-16 DIAGNOSIS — I110 Hypertensive heart disease with heart failure: Secondary | ICD-10-CM | POA: Diagnosis present

## 2020-08-16 DIAGNOSIS — Z7984 Long term (current) use of oral hypoglycemic drugs: Secondary | ICD-10-CM | POA: Diagnosis not present

## 2020-08-16 DIAGNOSIS — Z86711 Personal history of pulmonary embolism: Secondary | ICD-10-CM | POA: Diagnosis not present

## 2020-08-16 DIAGNOSIS — Z79899 Other long term (current) drug therapy: Secondary | ICD-10-CM | POA: Diagnosis not present

## 2020-08-16 DIAGNOSIS — M79671 Pain in right foot: Secondary | ICD-10-CM | POA: Diagnosis present

## 2020-08-16 DIAGNOSIS — A4151 Sepsis due to Escherichia coli [E. coli]: Principal | ICD-10-CM

## 2020-08-16 DIAGNOSIS — Z7983 Long term (current) use of bisphosphonates: Secondary | ICD-10-CM | POA: Diagnosis not present

## 2020-08-16 DIAGNOSIS — E119 Type 2 diabetes mellitus without complications: Secondary | ICD-10-CM | POA: Diagnosis present

## 2020-08-16 DIAGNOSIS — H409 Unspecified glaucoma: Secondary | ICD-10-CM | POA: Diagnosis present

## 2020-08-16 DIAGNOSIS — N390 Urinary tract infection, site not specified: Secondary | ICD-10-CM | POA: Diagnosis present

## 2020-08-16 DIAGNOSIS — Z87891 Personal history of nicotine dependence: Secondary | ICD-10-CM | POA: Diagnosis not present

## 2020-08-16 DIAGNOSIS — Z20822 Contact with and (suspected) exposure to covid-19: Secondary | ICD-10-CM | POA: Diagnosis present

## 2020-08-16 DIAGNOSIS — L100 Pemphigus vulgaris: Secondary | ICD-10-CM | POA: Diagnosis present

## 2020-08-16 DIAGNOSIS — I5032 Chronic diastolic (congestive) heart failure: Secondary | ICD-10-CM | POA: Diagnosis present

## 2020-08-16 DIAGNOSIS — Z86718 Personal history of other venous thrombosis and embolism: Secondary | ICD-10-CM | POA: Diagnosis not present

## 2020-08-16 DIAGNOSIS — R6520 Severe sepsis without septic shock: Secondary | ICD-10-CM | POA: Diagnosis present

## 2020-08-16 LAB — COMPREHENSIVE METABOLIC PANEL
ALT: 38 U/L (ref 0–44)
AST: 33 U/L (ref 15–41)
Albumin: 3.1 g/dL — ABNORMAL LOW (ref 3.5–5.0)
Alkaline Phosphatase: 73 U/L (ref 38–126)
Anion gap: 10 (ref 5–15)
BUN: 14 mg/dL (ref 8–23)
CO2: 25 mmol/L (ref 22–32)
Calcium: 8.9 mg/dL (ref 8.9–10.3)
Chloride: 103 mmol/L (ref 98–111)
Creatinine, Ser: 0.92 mg/dL (ref 0.44–1.00)
GFR, Estimated: >60 mL/min (ref >60)
Glucose, Bld: 116 mg/dL — ABNORMAL HIGH (ref 70–99)
Potassium: 4.2 mmol/L (ref 3.5–5.1)
Sodium: 138 mmol/L (ref 135–145)
Total Bilirubin: 0.7 mg/dL (ref 0.3–1.2)
Total Protein: 6.5 g/dL (ref 6.5–8.1)

## 2020-08-16 LAB — URINE CULTURE

## 2020-08-16 LAB — PROTIME-INR
INR: 2.6 — ABNORMAL HIGH (ref 0.8–1.2)
INR: 2.6 — ABNORMAL HIGH (ref 0.8–1.2)
Prothrombin Time: 26.6 seconds — ABNORMAL HIGH (ref 11.4–15.2)
Prothrombin Time: 26.9 seconds — ABNORMAL HIGH (ref 11.4–15.2)

## 2020-08-16 LAB — CORTISOL-AM, BLOOD: Cortisol - AM: 13.2 ug/dL (ref 6.7–22.6)

## 2020-08-16 LAB — CBC
HCT: 40.2 % (ref 36.0–46.0)
Hemoglobin: 13 g/dL (ref 12.0–15.0)
MCH: 29 pg (ref 26.0–34.0)
MCHC: 32.3 g/dL (ref 30.0–36.0)
MCV: 89.5 fL (ref 80.0–100.0)
Platelets: 130 10*3/uL — ABNORMAL LOW (ref 150–400)
RBC: 4.49 MIL/uL (ref 3.87–5.11)
RDW: 15.9 % — ABNORMAL HIGH (ref 11.5–15.5)
WBC: 10.6 10*3/uL — ABNORMAL HIGH (ref 4.0–10.5)
nRBC: 0 % (ref 0.0–0.2)

## 2020-08-16 LAB — GLUCOSE, CAPILLARY
Glucose-Capillary: 115 mg/dL — ABNORMAL HIGH (ref 70–99)
Glucose-Capillary: 199 mg/dL — ABNORMAL HIGH (ref 70–99)
Glucose-Capillary: 166 mg/dL — ABNORMAL HIGH (ref 70–99)
Glucose-Capillary: 130 mg/dL — ABNORMAL HIGH (ref 70–99)

## 2020-08-16 LAB — PROCALCITONIN: Procalcitonin: 9.39 ng/mL

## 2020-08-16 LAB — HEMOGLOBIN A1C
Hgb A1c MFr Bld: 7.1 % — ABNORMAL HIGH (ref 4.8–5.6)
Mean Plasma Glucose: 157.07 mg/dL

## 2020-08-16 MED ORDER — LACTATED RINGERS IV SOLN: INTRAVENOUS | Status: DC

## 2020-08-16 MED ORDER — WARFARIN SODIUM 4 MG PO TABS
4.0000 mg | ORAL_TABLET | Freq: Once | ORAL | Status: AC
  Administered 2020-08-16: 4 mg via ORAL
  Filled 2020-08-16 (×2): qty 1

## 2020-08-16 MED ORDER — WARFARIN - PHARMACIST DOSING INPATIENT: Freq: Every day | Status: DC

## 2020-08-16 MED ORDER — LINAGLIPTIN 5 MG PO TABS
5.0000 mg | ORAL_TABLET | Freq: Every day | ORAL | Status: DC
  Administered 2020-08-16 – 2020-08-17 (×2): 5 mg via ORAL
  Filled 2020-08-16 (×2): qty 1

## 2020-08-16 MED ORDER — BUSPIRONE HCL 5 MG PO TABS
10.0000 mg | ORAL_TABLET | Freq: Two times a day (BID) | ORAL | Status: DC
Start: 2020-08-16 — End: 2020-08-16

## 2020-08-16 NOTE — Progress Notes (Signed)
ANTICOAGULATION CONSULT NOTE - Initial Consult  Pharmacy Consult for warfarin Indication: atrial fibrillation, hx VTE  No Known Allergies  Patient Measurements: Height: 5\' 5"  (165.1 cm) Weight: 77.1 kg (170 lb) IBW/kg (Calculated) : 57   Vital Signs: Temp: 98.9 F (37.2 C) (01/31 0602) Temp Source: Oral (01/31 0215) BP: 177/75 (01/31 0602) Pulse Rate: 83 (01/31 0602)  Labs: Recent Labs    08/15/20 0422 08/16/20 0530  HGB 13.4 13.0  HCT 40.9 40.2  PLT 131* 130*  APTT 46*  --   LABPROT 38.7* 26.6*  26.9*  INR 4.1* 2.6*  2.6*  CREATININE 1.23* 0.92    Estimated Creatinine Clearance: 51.7 mL/min (by C-G formula based on SCr of 0.92 mg/dL).   Medical History: Past Medical History:  Diagnosis Date  . Atrial fibrillation with normal ventricular rate (HCC)   . CHF (congestive heart failure) (HCC)   . COPD (chronic obstructive pulmonary disease) (HCC)   . Diabetes mellitus without complication (HCC)   . Hx of blood clots   . Hypertension     Assessment: 80 y.o. female with medical history significant of diabetes, atrial fibrillation, diastolic dysfunction CHF, COPD, history of venous thrombi embolism and essential hypertension who was seen at [facility] initially complaining of foot pain.    Pharmacy consulted to dose warfarin while inpatient.    Home dose: warfarin 2mg  daily except 4mg  on Mon/Fri Last dose on 1/29 PTA INR: 4.1 supratherapeutic on admission  08/16/2020, Today  INR = 2.6 is therapeutic  CBC: Hgb WNL, Plt (130) slightly low but stable  Diet: Heart healthy/carb modified  DDI: Pt received one dose of metronidazole on 1/30 which can enhance effects of warfarin.   Goal of Therapy:  INR 2-3   Plan:   Warfarin 4 mg PO today, resume home dose  Recheck INR with AM labs tomorrow  2/29, PharmD 08/16/20 7:58 AM

## 2020-08-16 NOTE — Progress Notes (Signed)
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 coli  Assessment & Plan:   Principal Problem:   Sepsis (HCC) Active Problems:   Acute lower UTI   A-fib (HCC)   Diabetes (HCC)   Essential hypertension   1. E. coli bacteremia likely secondary to E. coli UTI a. Continue ceftriaxone 2 g every 24 given sepsis b. Narrow according to culture c. Continue LR 125 and scale back to 75 cc probably in a.m. 2. Injury to right second toe?  Infected toe a. Foot does not look purulent injured or gangrenous b. Just monitor at this stage 3. Pemphigus vulgaris 4. A. fib CHADS2 score >4 on anticoagulation a. Continue Cardizem 240 daily and metoprolol 25 twice daily b. Continue Coumadin-INR therapeutic 5. 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 Continue above meds holding at this time Lasix 40 twice daily b. Above meds  6. past remote history DVT PE 2014 a. On Coumadin which we'll continue 7. Restrictive lung disease a.  8. DM TY 2 A1c 7 a. Janumet held at recent office visit b. Can continue gabapentin 300 at bedtime c. Sugar trends are 1 15-1 22 9. HTN 10. Glaucoma 11. Varicose vein  DVT prophylaxis: Coumadin Code Status: Full Family Communication: called daughter Liborio Nixon [phone] and updated Disposition:  Status is: Observation  The patient will require care spanning > 2 midnights and should  be moved to inpatient because: Persistent severe electrolyte disturbances, Ongoing diagnostic testing needed not appropriate for outpatient work up, Unsafe d/c plan and IV treatments appropriate due to intensity of illness or inability to take PO  Dispo: The patient is from: Home              Anticipated d/c is to: Home              Anticipated d/c date is: 2 days              Patient currently is not medically stable to d/c.   Difficult to place patient No       Consultants:   None  Procedures:   Antimicrobials: Ceftriaxone    Subjective:  Awake alert no distress No cough cold diarr no abd  Objective: Vitals:   08/15/20 1831 08/15/20 2231 08/16/20 0215 08/16/20 0602  BP: 120/79 129/73 (!) 151/95 (!) 177/75  Pulse: 67 84 85 83  Resp: 20 20 20 18   Temp: 98.7 F (37.1 C) 97.8 F (36.6 C) 99.8 F (37.7 C) 98.9 F (37.2 C)  TempSrc: Oral Oral Oral   SpO2: 96% 96% 95% 94%  Weight:      Height:        Intake/Output Summary (Last 24 hours) at 08/16/2020 0742 Last data filed at 08/16/2020 0300 Gross per 24 hour  Intake 3776.77 ml  Output 650 ml  Net 3126.77 ml   Filed Weights   08/15/20 0434  Weight: 77.1 kg    Examination:  eomi ncat alert  cta b no added no wheeze abd soft nt nd  Neuro intact R toe dark and not infected appearing  Data Reviewed: I have personally reviewed following labs and imaging studies  White count down from 15.0-->10.6 Lactic acid 3.2-->1.3 BUNs/creatinine 20/1.2-->14/0.9 Platelet 130 Procalcitonin 9.3 BNP 504 A1c 7.1  COVID-19 Labs  No results for input(s): DDIMER, FERRITIN, LDH, CRP in the last 72 hours.  Lab Results  Component Value Date   SARSCOV2NAA NEGATIVE 08/15/2020   SARSCOV2NAA NEGATIVE 11/10/2019     Radiology Studies: [facility] Chest Port 1 View  Result Date: 08/15/2020 CLINICAL DATA:  Questionable sepsis EXAM: PORTABLE CHEST 1 VIEW COMPARISON:  11/10/2019 FINDINGS: Cardiomegaly with prominent left atrial  contour. Symmetric generalized prominence of interstitial markings. No Kerley lines, effusion, or focal consolidation. IMPRESSION: Cardiomegaly and mild vascular congestion. Electronically Signed   By: Marnee Spring M.D.   On: 08/15/2020 04:36     Scheduled Meds: . aspirin  81 mg Oral Daily  . brimonidine  1 drop Left Eye TID  . busPIRone  10 mg Oral BID  . diltiazem  240 mg Oral Daily  . insulin aspart  0-15 Units Subcutaneous TID WC  . insulin aspart  0-5 Units Subcutaneous QHS  . latanoprost  1 drop Left Eye QHS  . metFORMIN  500 mg Oral BID WC  . metoprolol tartrate  25 mg Oral BID  . pantoprazole  40 mg Oral Daily  . predniSONE  5 mg Oral Q breakfast  . warfarin  4 mg Oral ONCE-1600  . Warfarin - Pharmacist Dosing Inpatient   Does not apply q1600   Continuous Infusions: . cefTRIAXone (ROCEPHIN)  IV 2 g (08/15/20 2038)  . lactated ringers 125 mL/hr at 08/16/20 0406     LOS: 0 days    Time spent: 49  Rhetta Mura, MD [facility] To contact the attending provider between 7A-7P or the covering provider during after hours 7P-7A, please log into the web site [email or web address] and access using universal [place] password for that web site. If you do not have the password, please call the hospital operator.  08/16/2020, 7:42 AM

## 2020-08-16 NOTE — Progress Notes (Signed)
Patient arrived to room 1331 around 1615. Ambulatory rolling IV pole, sitting on bench in room. Voided in BR. No complaints voiced at this time.

## 2020-08-16 NOTE — Progress Notes (Signed)
Provider contacted regarding patients request for discharge today. Pt is to receive another dose of IV Rocephin and will plan for discharge tomorrow. Pt/daughter updated regarding plan of care.

## 2020-08-17 LAB — CULTURE, BLOOD (SINGLE)
Special Requests: ADEQUATE
Special Requests: ADEQUATE

## 2020-08-17 LAB — PROTIME-INR
INR: 2 — ABNORMAL HIGH (ref 0.8–1.2)
Prothrombin Time: 22.2 seconds — ABNORMAL HIGH (ref 11.4–15.2)

## 2020-08-17 LAB — GLUCOSE, CAPILLARY: Glucose-Capillary: 149 mg/dL — ABNORMAL HIGH (ref 70–99)

## 2020-08-17 MED ORDER — METOPROLOL TARTRATE 25 MG PO TABS: 25.0000 mg | ORAL_TABLET | Freq: Two times a day (BID) | ORAL | Status: DC

## 2020-08-17 MED ORDER — LEVOFLOXACIN 500 MG PO TABS: 500.0000 mg | ORAL_TABLET | Freq: Every day | ORAL | 0 refills | Status: AC

## 2020-08-17 MED ORDER — PREDNISONE 10 MG PO TABS: 5.0000 mg | ORAL_TABLET | Freq: Every day | ORAL | Status: DC

## 2020-08-17 NOTE — Discharge Summary (Addendum)
Physician Discharge Summary  Jill Crane PVX:[id number] DOB: 25-May-1941 DOA: 08/15/2020  PCP: Zoila Shutter, MD  Admit date: 08/15/2020 Discharge date: 08/17/2020  Time spent: 26 minutes  Recommendations for Outpatient Follow-up:  1. Requires 10 days total of antibiotics for E. coli bacteremia 2. Recommend outpatient follow-up with Chem-12, CBC in 2 to 3 weeks 3. 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 coli  Hospital Course:  1. Severe sepsis with E. coli bacteremia likely secondary to E. coli UTI a. Continue ceftriaxone 2 g every 24 given sepsis b. Cultures were pansensitive and patient was placed on Levaquin on discharge to complete a total of 10 days circumscribed course for treatment of this c. Saline locked on discharge 2. Injury to right second toe?  Infected toe a. Foot does not look purulent injured or gangrenous b. Just monitor at this stage 3. Pemphigus vulgaris 1. Continue low-dose steroids prednisone on discharge 2. Would recommend outpatient follow-up with dermatology 4. A. fib CHADS2 score >4 on  anticoagulation a. Continue Cardizem 240 daily and metoprolol 25 twice daily b. Continue Coumadin-INR therapeutic 5. 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 Initially Lasix held-on discharge Home meds resumed b. Will need chemistry panel in 2 weeks to make sure not dehydrated 6. past remote history DVT PE 2014 a. On Coumadin which we'll continue 7. Restrictive lung disease a. Stable at this time 8. DM TY 2 A1c 7 a. Janumet held at recent office visit-resume Metformin at home b. Can continue gabapentin 300 at bedtime c. Sugar trends are well controlled 9. HTN 10. Glaucoma 11. Varicose vein   Discharge Exam: Vitals:   08/16/20 2044 08/17/20 0521  BP: 124/83 (!) 152/66  Pulse: 76 93  Resp: 17 17  Temp: 98.4 F (36.9 C) 98.9 F (37.2 C)  SpO2: 94% 90%    General: Awake alert coherent no distress EOMI NCAT no focal deficit up and about no issues Cardiovascular: S1-S2 no murmur no rub no gallop Respiratory: Clinically clear no added sound no rales no rhonchi Neurologically intact no focal deficit  Discharge Instructions   Discharge Instructions    Diet - low sodium heart healthy   Complete by: As directed    Discharge instructions   Complete by: As directed    Finish all antibiotics as per rx Make sure u follow with a primary MD in 1-2 weeks Pls get labs in 2 weeks as an outpatient Follow up with ur dermatologist as scheduled   Increase activity slowly   Complete by: As directed      Allergies as of 08/17/2020   No Known Allergies     Medication List    STOP taking these medications  ipratropium-albuterol 0.5-2.5 (3) MG/3ML Soln Commonly known as: DUONEB   Janumet 50-1000 MG tablet Generic drug: sitaGLIPtin-metformin     TAKE these medications   albuterol 108 (90 Base) MCG/ACT inhaler Commonly known as: VENTOLIN HFA Inhale 2 puffs into the lungs every 6 (six) hours as needed for wheezing or shortness of breath.   alendronate 35 MG tablet Commonly known as: FOSAMAX Take 35  mg by mouth every 7 (seven) days. Take with a full glass of water on an empty stomach.   aspirin 81 MG chewable tablet Chew 81 mg by mouth daily.   brimonidine 0.2 % ophthalmic solution Commonly known as: ALPHAGAN Place 1 drop into the left eye in the morning and at bedtime.   busPIRone 10 MG tablet Commonly known as: BUSPAR Take 10 mg by mouth 2 (two) times daily.   calcium-vitamin D 500-200 MG-UNIT tablet Commonly known as: OSCAL WITH D Take 1 tablet by mouth.   dorzolamide 2 % ophthalmic solution Commonly known as: TRUSOPT Place 1 drop into both eyes at bedtime.   furosemide 20 MG tablet Commonly known as: LASIX Take 40 mg (2 tablets) tomorrow and the day after, then continue with 20 mg daily What changed:   how much to take  how to take this  when to take this  additional instructions   gabapentin 300 MG capsule Commonly known as: NEURONTIN Take 300 mg by mouth at bedtime.   levofloxacin 500 MG tablet Commonly known as: Levaquin Take 1 tablet (500 mg total) by mouth daily for 10 days.   Lumigan 0.01 % Soln Generic drug: bimatoprost Place 1 drop into both eyes at bedtime.   magnesium oxide 400 (241.3 Mg) MG tablet Commonly known as: MAG-OX Take 1 tablet by mouth daily.   Matzim LA 240 MG 24 hr tablet Generic drug: diltiazem Take 240 mg by mouth daily.   metoprolol tartrate 25 MG tablet Commonly known as: LOPRESSOR Take 1 tablet (25 mg total) by mouth 2 (two) times daily.   omeprazole 20 MG capsule Commonly known as: PRILOSEC Take 20 mg by mouth daily.   predniSONE 10 MG tablet Commonly known as: DELTASONE Take 0.5 tablets (5 mg total) by mouth daily with breakfast. What changed: Another medication with the same name was removed. Continue taking this medication, and follow the directions you see here.   warfarin 2 MG tablet Commonly known as: COUMADIN Take 2-4 mg by mouth as directed. Take 1 tablet (2 mg) daily except Take 2 tablets (4 mg) on  (Mon & Fri)      No Known Allergies    The results of significant diagnostics from this hospitalization (including imaging, microbiology, ancillary and laboratory) are listed below for reference.    Significant Diagnostic Studies: DG Chest Port 1 View  Result Date: 08/15/2020 CLINICAL DATA:  Questionable sepsis EXAM: PORTABLE CHEST 1 VIEW COMPARISON:  11/10/2019 FINDINGS: Cardiomegaly with prominent left atrial contour. Symmetric generalized prominence of interstitial markings. No Kerley lines, effusion, or focal consolidation. IMPRESSION: Cardiomegaly and mild vascular congestion. Electronically Signed   By: Marnee SpringJonathon  Watts M.D.   On: 08/15/2020 04:36    Microbiology: Recent Results (from the past 240 hour(s))  Urine culture     Status: Abnormal   Collection Time: 08/15/20  4:15 AM   Specimen: In/Out Cath Urine  Result Value Ref Range Status   Specimen Description   Final    IN/OUT CATH URINE Performed at [facility] [place], [address] [place], [place] [address]  Special Requests   Final    NONE Performed at [facility], [address] [place], [place] [address]    Culture MULTIPLE SPECIES PRESENT, SUGGEST RECOLLECTION (A)  Final   Report Status 08/16/2020 FINAL  Final  Blood culture (routine single)     Status: Abnormal (Preliminary result)   Collection Time: 08/15/20  4:22 AM   Specimen: BLOOD RIGHT FOREARM  Result Value Ref Range Status   Specimen Description BLOOD RIGHT FOREARM  Final   Special Requests   Final    BOTTLES DRAWN AEROBIC AND ANAEROBIC Blood Culture adequate volume   Culture  Setup Time   Final    GRAM NEGATIVE RODS IN BOTH AEROBIC AND ANAEROBIC BOTTLES Organism ID to follow CRITICAL RESULT CALLED TO, READ BACK BY AND VERIFIED WITH: PAHRMD A PHAM 08/15/20 AT 2002 SK    Culture ESCHERICHIA COLI (A)  Final   Report Status PENDING  Incomplete   Organism ID, Bacteria ESCHERICHIA COLI  Final      Susceptibility   Escherichia coli  - MIC*    AMPICILLIN <=2 SENSITIVE Sensitive     CEFAZOLIN <=4 SENSITIVE Sensitive     CEFEPIME <=0.12 SENSITIVE Sensitive     CEFTAZIDIME <=1 SENSITIVE Sensitive     CEFTRIAXONE <=0.25 SENSITIVE Sensitive     CIPROFLOXACIN <=0.25 SENSITIVE Sensitive     GENTAMICIN <=1 SENSITIVE Sensitive     IMIPENEM <=0.25 SENSITIVE Sensitive     TRIMETH/SULFA <=20 SENSITIVE Sensitive     AMPICILLIN/SULBACTAM <=2 SENSITIVE Sensitive     PIP/TAZO Value in next row Sensitive      <=4 SENSITIVEPerformed at [facility], [address], [place], [place] [address]    * ESCHERICHIA COLI  SARS Coronavirus 2 by RT PCR (hospital order, performed in [facility] hospital lab) Nasopharyngeal Nasopharyngeal Swab     Status: None   Collection Time: 08/15/20  4:22 AM   Specimen: Nasopharyngeal Swab  Result Value Ref Range Status   SARS Coronavirus 2 NEGATIVE NEGATIVE Final    Comment: (NOTE) SARS-CoV-2 target nucleic acids are NOT DETECTED.  The SARS-CoV-2 RNA is generally detectable in upper and lower respiratory specimens during the acute phase of infection. The lowest concentration of SARS-CoV-2 viral copies this assay can detect is 250 copies / mL. A negative result does not preclude SARS-CoV-2 infection and should not be used as the sole basis for treatment or other patient management decisions.  A negative result may occur with improper specimen collection / handling, submission of specimen other than nasopharyngeal swab, presence of viral mutation(s) within the areas targeted by this assay, and inadequate number of viral copies (<250 copies / mL). A negative result must be combined with clinical observations, patient history, and epidemiological information.  Fact Sheet for Patients:   BoilerBrush.com.cy  Fact Sheet for Healthcare Providers: https://pope.com/  This test is not yet approved or  cleared by the [place] FDA and has been  authorized for detection and/or diagnosis of SARS-CoV-2 by FDA under an Emergency Use Authorization (EUA).  This EUA will remain in effect (meaning this test can be used) for the duration of the COVID-19 declaration under Section 564(b)(1) of the Act, 21 U.S.C. section 360bbb-3(b)(1), unless the authorization is terminated or revoked sooner.  Performed at [facility], [address] [place], [place] [address]   Rapid Influenza A&B Antigens     Status: None   Collection Time: 08/15/20  4:22 AM   Specimen: Respiratory  Result Value Ref Range Status   Influenza A ([facility]) NEGATIVE NEGATIVE Final   Influenza B ([facility]) NEGATIVE NEGATIVE Final    Comment: Negative results do not exclude influenza virus infection, and influenza should still be considered if clinical suspicion is high. Performed at [facility], [address] [place], [place] [address]   Blood Culture ID Panel (Reflexed)     Status: Abnormal   Collection Time: 08/15/20  4:22 AM  Result Value Ref Range Status   Enterococcus faecalis NOT DETECTED NOT DETECTED Final   Enterococcus Faecium NOT DETECTED NOT DETECTED Final   Listeria monocytogenes NOT DETECTED NOT DETECTED Final   Staphylococcus species NOT DETECTED NOT DETECTED Final   Staphylococcus aureus (BCID) NOT DETECTED NOT DETECTED Final   Staphylococcus epidermidis NOT DETECTED NOT DETECTED Final   Staphylococcus lugdunensis NOT DETECTED NOT DETECTED Final   Streptococcus species NOT DETECTED NOT DETECTED Final   Streptococcus agalactiae NOT DETECTED NOT DETECTED Final   Streptococcus pneumoniae NOT DETECTED NOT DETECTED Final   Streptococcus pyogenes NOT DETECTED NOT DETECTED Final   A.calcoaceticus-baumannii NOT DETECTED NOT DETECTED Final   Bacteroides fragilis NOT DETECTED NOT DETECTED Final   Enterobacterales DETECTED (A) NOT DETECTED Final    Comment: Enterobacterales represent a large order of gram negative bacteria, not a single  organism. CRITICAL RESULT CALLED TO, READ BACK BY AND VERIFIED WITH: PAHRMD A PHAM 08/15/20 AT 2002 SK    Enterobacter cloacae complex NOT DETECTED NOT DETECTED Final   Escherichia coli DETECTED (A) NOT DETECTED Final    Comment: CRITICAL RESULT CALLED TO, READ BACK BY AND VERIFIED WITH: PAHRMD A PHAM 08/15/20 AT 2002 SK    Klebsiella aerogenes NOT DETECTED NOT DETECTED Final   Klebsiella oxytoca NOT DETECTED NOT DETECTED Final   Klebsiella pneumoniae NOT DETECTED NOT DETECTED Final   Proteus species NOT DETECTED NOT DETECTED Final   Salmonella species NOT DETECTED NOT DETECTED Final   Serratia marcescens NOT DETECTED NOT DETECTED Final   Haemophilus influenzae NOT DETECTED NOT DETECTED Final   Neisseria meningitidis NOT DETECTED NOT DETECTED Final   Pseudomonas aeruginosa NOT DETECTED NOT DETECTED Final   Stenotrophomonas maltophilia NOT DETECTED NOT DETECTED Final   Candida albicans NOT DETECTED NOT DETECTED Final   Candida auris NOT DETECTED NOT DETECTED Final   Candida glabrata NOT DETECTED NOT DETECTED Final   Candida krusei NOT DETECTED NOT DETECTED Final   Candida parapsilosis NOT DETECTED NOT DETECTED Final   Candida tropicalis NOT DETECTED NOT DETECTED Final   Cryptococcus neoformans/gattii NOT DETECTED NOT DETECTED Final   CTX-M ESBL NOT DETECTED NOT DETECTED Final   Carbapenem resistance IMP NOT DETECTED NOT DETECTED Final   Carbapenem resistance KPC NOT DETECTED NOT DETECTED Final   Carbapenem resistance NDM NOT DETECTED NOT DETECTED Final   Carbapenem resist OXA 48 LIKE NOT DETECTED NOT DETECTED Final   Carbapenem resistance VIM NOT DETECTED NOT DETECTED Final    Comment: Performed at [facility], [address] [place] [address] [place], [place] [address]  Culture, blood (single)     Status: None (Preliminary result)   Collection Time: 08/15/20  5:18 AM   Specimen: BLOOD  Result Value Ref Range Status   Specimen Description   Final    BLOOD LEFT ANTECUBITAL Performed at [facility], [address] [place], [place] [address]    Special Requests   Final    BOTTLES DRAWN AEROBIC AND ANAEROBIC Blood Culture adequate volume Performed at [facility]  [address], [address] [place], [place] [address]    Culture  Setup Time   Final    GRAM NEGATIVE RODS AEROBIC BOTTLE ONLY CRITICAL VALUE NOTED.  VALUE IS CONSISTENT WITH PREVIOUSLY REPORTED AND CALLED VALUE. Performed at [facility], [address], [place], [place] [address]    Culture GRAM NEGATIVE RODS  Final   Report Status PENDING  Incomplete     Labs: Basic Metabolic Panel: Recent Labs  Lab 08/15/20 0422 08/16/20 0530  NA 135 138  K 5.3* 4.2  CL 103 103  CO2 21* 25  GLUCOSE 132* 116*  BUN 20 14  CREATININE 1.23* 0.92  CALCIUM 8.6* 8.9   Liver Function Tests: Recent Labs  Lab 08/15/20 0422 08/16/20 0530  AST 35 33  ALT 35 38  ALKPHOS 71 73  BILITOT 0.4 0.7  PROT 6.4* 6.5  ALBUMIN 3.1* 3.1*   No results for input(s): LIPASE, AMYLASE in the last 168 hours. No results for input(s): AMMONIA in the last 168 hours. CBC: Recent Labs  Lab 08/15/20 0422 08/16/20 0530  WBC 15.0* 10.6*  NEUTROABS 12.1*  --   HGB 13.4 13.0  HCT 40.9 40.2  MCV 88.5 89.5  PLT 131* 130*   Cardiac Enzymes: No results for input(s): CKTOTAL, CKMB, CKMBINDEX, TROPONINI in the last 168 hours. BNP: BNP (last 3 results) Recent Labs    11/10/19 1552 08/15/20 0422  BNP 386.1* 504.1*    ProBNP (last 3 results) No results for input(s): PROBNP in the last 8760 hours.  CBG: Recent Labs  Lab 08/16/20 0749 08/16/20 1211 08/16/20 1712 08/16/20 2040 08/17/20 0747  GLUCAP 115* 199* 166* 130* 149*       Signed:  Rhetta Mura MD   [facility] 08/17/2020, 8:33 AM

## 2020-12-30 ENCOUNTER — Other Ambulatory Visit: Payer: Self-pay

## 2020-12-30 ENCOUNTER — Emergency Department (HOSPITAL_BASED_OUTPATIENT_CLINIC_OR_DEPARTMENT_OTHER): Payer: Medicare Other

## 2020-12-30 ENCOUNTER — Emergency Department (HOSPITAL_BASED_OUTPATIENT_CLINIC_OR_DEPARTMENT_OTHER)
Admission: EM | Admit: 2020-12-30 | Discharge: 2020-12-30 | Disposition: A | Discharge status: Home / Self Care | Payer: Medicare Other | Attending: Emergency Medicine | Admitting: Emergency Medicine

## 2020-12-30 ENCOUNTER — Encounter (HOSPITAL_BASED_OUTPATIENT_CLINIC_OR_DEPARTMENT_OTHER): Payer: Self-pay

## 2020-12-30 DIAGNOSIS — M7732 Calcaneal spur, left foot: Secondary | ICD-10-CM

## 2020-12-30 DIAGNOSIS — I509 Heart failure, unspecified: Secondary | ICD-10-CM | POA: Insufficient documentation

## 2020-12-30 DIAGNOSIS — I110 Hypertensive heart disease with heart failure: Secondary | ICD-10-CM | POA: Diagnosis not present

## 2020-12-30 DIAGNOSIS — Z7901 Long term (current) use of anticoagulants: Secondary | ICD-10-CM | POA: Diagnosis not present

## 2020-12-30 DIAGNOSIS — I4891 Unspecified atrial fibrillation: Secondary | ICD-10-CM | POA: Insufficient documentation

## 2020-12-30 DIAGNOSIS — Z87891 Personal history of nicotine dependence: Secondary | ICD-10-CM | POA: Diagnosis not present

## 2020-12-30 DIAGNOSIS — J449 Chronic obstructive pulmonary disease, unspecified: Secondary | ICD-10-CM | POA: Insufficient documentation

## 2020-12-30 DIAGNOSIS — M79662 Pain in left lower leg: Secondary | ICD-10-CM | POA: Diagnosis present

## 2020-12-30 DIAGNOSIS — E119 Type 2 diabetes mellitus without complications: Secondary | ICD-10-CM | POA: Diagnosis not present

## 2020-12-30 DIAGNOSIS — M79605 Pain in left leg: Secondary | ICD-10-CM

## 2020-12-30 NOTE — ED Notes (Signed)
Attempt to redraw PT/INR unsuccessful. Lab reports hemolysis for collected tubes x2,  Dr Jacqulyn Bath made aware, will review other results to determine if additional venipuncture attempts necessary.

## 2020-12-30 NOTE — ED Notes (Signed)
R heel pain x1 month, no deformity or swelling noted. Also c/o pain behind L knee.

## 2020-12-30 NOTE — Discharge Instructions (Addendum)
You were seen in the emergency room today with left heel and leg pain.  There is no new blood clot in your leg.  You will need to keep your follow-up appointment to check your INR as scheduled.  We also found a heel spur on the x-ray done today.  Please discuss this with your podiatrist so they can follow-up in make a treatment plan for you.  You may take Tylenol as needed for pain or use any topical pain medications.  You cannot use Aleve or ibuprofen as you are taking Coumadin.

## 2020-12-30 NOTE — ED Triage Notes (Signed)
Pt c/o left heel pain and pain behind her left knee x 1 month. Worse with ambulation. Hx of DVT.

## 2020-12-30 NOTE — ED Notes (Signed)
ED Provider at bedside. 

## 2020-12-30 NOTE — ED Provider Notes (Signed)
Emergency Department Provider Note   I have reviewed the triage vital signs and the nursing notes.   HISTORY  Chief Complaint Leg Pain   HPI Jill Crane is a 80 y.o. female with past medical history reviewed below including A. fib, prior DVT, anticoagulated on Coumadin presents to the emergency department with pain behind the left knee and calf radiating down into the heel.  She has been complaining of pain for the last month.  She is not having chest pain or shortness of breath.  The pain is worse with ambulation.  No fevers or chills.  No injury. Daughter accompanies the patient and notes that she has had normal INR's recently although her most recent appointment got rescheduled.    Past Medical History:  Diagnosis Date   Atrial fibrillation with normal ventricular rate (HCC)    CHF (congestive heart failure) (HCC)    COPD (chronic obstructive pulmonary disease) (HCC)    Diabetes mellitus without complication (HCC)    Hx of blood clots    Hypertension     Patient Active Problem List   Diagnosis Date Noted   SIRS (systemic inflammatory response syndrome) (HCC) 08/15/2020   Acute lower UTI 08/15/2020   A-fib (HCC) 08/15/2020   Diabetes (HCC) 08/15/2020   Essential hypertension 08/15/2020   Sepsis (HCC) 11/10/2019    History reviewed. No pertinent surgical history.  Allergies Patient has no known allergies.  History reviewed. No pertinent family history.  Social History Social History   Tobacco Use   Smoking status: Former    Pack years: 0.00   Smokeless tobacco: Never  Substance Use Topics   Alcohol use: No    Review of Systems  Constitutional: No fever/chills Eyes: No visual changes. ENT: No sore throat. Cardiovascular: Denies chest pain. Respiratory: Denies shortness of breath. Gastrointestinal: No abdominal pain.  No nausea, no vomiting.  No diarrhea.  No constipation. Genitourinary: Negative for dysuria. Musculoskeletal: Negative for back  pain. Positive left leg/heel pain.  Skin: Negative for rash. Neurological: Negative for headaches, focal weakness or numbness.  10-point ROS otherwise negative.  ____________________________________________   PHYSICAL EXAM:  VITAL SIGNS: ED Triage Vitals  Enc Vitals Group     BP 12/30/20 1640 (!) 159/102     Pulse Rate 12/30/20 1640 72     Resp 12/30/20 1640 18     Temp 12/30/20 1640 98.2 F (36.8 C)     Temp Source 12/30/20 1640 Oral     SpO2 12/30/20 1640 99 %     Weight 12/30/20 1641 169 lb (76.7 kg)     Height 12/30/20 1641 5\' 5"  (1.651 m)   Constitutional: Alert and oriented. Well appearing and in no acute distress. Eyes: Conjunctivae are normal.  Head: Atraumatic. Nose: No congestion/rhinnorhea. Mouth/Throat: Mucous membranes are moist.  Neck: No stridor.  Cardiovascular: Normal rate, regular rhythm. Good peripheral circulation. Grossly normal heart sounds.   Respiratory: Normal respiratory effort.  No retractions. Lungs CTAB. Gastrointestinal: Soft and nontender. No distention.  Musculoskeletal: Mild swelling in the left leg compared to the right.  Mild tenderness to palpation of the heel without redness.  No bony deformity. No knee warmth or effusion.  Neurologic:  Normal speech and language. No gross focal neurologic deficits are appreciated.  Skin:  Skin is warm, dry and intact. No rash noted.   ____________________________________________  RADIOLOGY  DVT and plain film reviewed.   ____________________________________________   PROCEDURES  Procedure(s) performed:   Procedures  None  ____________________________________________  INITIAL IMPRESSION / ASSESSMENT AND PLAN / ED COURSE  Pertinent labs & imaging results that were available during my care of the patient were reviewed by me and considered in my medical decision making (see chart for details).   Patient presents to the emergency department with pain in the left leg.  Symptoms started  in the popliteal area and are now mostly in the heel with some swelling.  Does have history of DVT but is anticoagulated on Coumadin.  Plan for INR check, plain film of the foot, DVT ultrasound of the left leg.   Plain film shows heel spur likely causing the patient's heel discomfort.  DVT ultrasound shows no acute clot.  Clinically lower suspicion for this.  No shortness of breath or chest pain to suspect PE.  Patient has INR check later this week.  We will continue her Coumadin and follow with PCP and INR checks regularly. ____________________________________________  FINAL CLINICAL IMPRESSION(S) / ED DIAGNOSES  Final diagnoses:  Pain of left lower extremity  Heel spur, left      Note:  This document was prepared using Dragon voice recognition software and may include unintentional dictation errors.  Alona Bene, MD, [facility] Emergency Medicine    Anahlia Iseminger, Arlyss Repress, MD 01/01/21 1049

## 2021-01-20 ENCOUNTER — Inpatient Hospital Stay (HOSPITAL_BASED_OUTPATIENT_CLINIC_OR_DEPARTMENT_OTHER)
Admission: EM | Admit: 2021-01-20 | Discharge: 2021-01-24 | DRG: 445 | Disposition: A | Discharge status: Home / Self Care | Payer: Medicare Other | Attending: Family Medicine | Admitting: Family Medicine

## 2021-01-20 ENCOUNTER — Emergency Department (HOSPITAL_BASED_OUTPATIENT_CLINIC_OR_DEPARTMENT_OTHER): Payer: Medicare Other

## 2021-01-20 ENCOUNTER — Other Ambulatory Visit: Payer: Self-pay

## 2021-01-20 ENCOUNTER — Encounter (HOSPITAL_BASED_OUTPATIENT_CLINIC_OR_DEPARTMENT_OTHER): Payer: Self-pay | Admitting: *Deleted

## 2021-01-20 DIAGNOSIS — Z20822 Contact with and (suspected) exposure to covid-19: Secondary | ICD-10-CM | POA: Diagnosis present

## 2021-01-20 DIAGNOSIS — I10 Essential (primary) hypertension: Secondary | ICD-10-CM | POA: Diagnosis present

## 2021-01-20 DIAGNOSIS — Z7901 Long term (current) use of anticoagulants: Secondary | ICD-10-CM

## 2021-01-20 DIAGNOSIS — Z7952 Long term (current) use of systemic steroids: Secondary | ICD-10-CM

## 2021-01-20 DIAGNOSIS — Z86718 Personal history of other venous thrombosis and embolism: Secondary | ICD-10-CM | POA: Diagnosis not present

## 2021-01-20 DIAGNOSIS — I700 Atherosclerosis of aorta: Secondary | ICD-10-CM | POA: Diagnosis present

## 2021-01-20 DIAGNOSIS — R7989 Other specified abnormal findings of blood chemistry: Secondary | ICD-10-CM | POA: Diagnosis not present

## 2021-01-20 DIAGNOSIS — J449 Chronic obstructive pulmonary disease, unspecified: Secondary | ICD-10-CM | POA: Diagnosis present

## 2021-01-20 DIAGNOSIS — K5730 Diverticulosis of large intestine without perforation or abscess without bleeding: Secondary | ICD-10-CM | POA: Diagnosis present

## 2021-01-20 DIAGNOSIS — R1011 Right upper quadrant pain: Secondary | ICD-10-CM | POA: Diagnosis not present

## 2021-01-20 DIAGNOSIS — I4891 Unspecified atrial fibrillation: Secondary | ICD-10-CM | POA: Diagnosis present

## 2021-01-20 DIAGNOSIS — K8050 Calculus of bile duct without cholangitis or cholecystitis without obstruction: Secondary | ICD-10-CM | POA: Diagnosis not present

## 2021-01-20 DIAGNOSIS — K8020 Calculus of gallbladder without cholecystitis without obstruction: Secondary | ICD-10-CM

## 2021-01-20 DIAGNOSIS — I4821 Permanent atrial fibrillation: Secondary | ICD-10-CM | POA: Diagnosis present

## 2021-01-20 DIAGNOSIS — I110 Hypertensive heart disease with heart failure: Secondary | ICD-10-CM | POA: Diagnosis present

## 2021-01-20 DIAGNOSIS — F419 Anxiety disorder, unspecified: Secondary | ICD-10-CM | POA: Diagnosis present

## 2021-01-20 DIAGNOSIS — Z95828 Presence of other vascular implants and grafts: Secondary | ICD-10-CM | POA: Diagnosis not present

## 2021-01-20 DIAGNOSIS — K828 Other specified diseases of gallbladder: Secondary | ICD-10-CM | POA: Diagnosis present

## 2021-01-20 DIAGNOSIS — Z8719 Personal history of other diseases of the digestive system: Secondary | ICD-10-CM

## 2021-01-20 DIAGNOSIS — E1142 Type 2 diabetes mellitus with diabetic polyneuropathy: Secondary | ICD-10-CM | POA: Diagnosis present

## 2021-01-20 DIAGNOSIS — I5032 Chronic diastolic (congestive) heart failure: Secondary | ICD-10-CM | POA: Diagnosis present

## 2021-01-20 DIAGNOSIS — K819 Cholecystitis, unspecified: Secondary | ICD-10-CM | POA: Diagnosis not present

## 2021-01-20 DIAGNOSIS — N179 Acute kidney failure, unspecified: Secondary | ICD-10-CM | POA: Diagnosis present

## 2021-01-20 DIAGNOSIS — I2510 Atherosclerotic heart disease of native coronary artery without angina pectoris: Secondary | ICD-10-CM | POA: Diagnosis present

## 2021-01-20 DIAGNOSIS — E1151 Type 2 diabetes mellitus with diabetic peripheral angiopathy without gangrene: Secondary | ICD-10-CM | POA: Diagnosis present

## 2021-01-20 DIAGNOSIS — K8062 Calculus of gallbladder and bile duct with acute cholecystitis without obstruction: Principal | ICD-10-CM | POA: Diagnosis present

## 2021-01-20 DIAGNOSIS — Z7983 Long term (current) use of bisphosphonates: Secondary | ICD-10-CM | POA: Diagnosis not present

## 2021-01-20 DIAGNOSIS — R778 Other specified abnormalities of plasma proteins: Secondary | ICD-10-CM

## 2021-01-20 DIAGNOSIS — R000 Tachycardia, unspecified: Secondary | ICD-10-CM | POA: Diagnosis present

## 2021-01-20 DIAGNOSIS — R112 Nausea with vomiting, unspecified: Secondary | ICD-10-CM | POA: Diagnosis present

## 2021-01-20 DIAGNOSIS — D6859 Other primary thrombophilia: Secondary | ICD-10-CM | POA: Diagnosis present

## 2021-01-20 DIAGNOSIS — E119 Type 2 diabetes mellitus without complications: Secondary | ICD-10-CM

## 2021-01-20 DIAGNOSIS — L100 Pemphigus vulgaris: Secondary | ICD-10-CM | POA: Diagnosis present

## 2021-01-20 DIAGNOSIS — R1110 Vomiting, unspecified: Secondary | ICD-10-CM

## 2021-01-20 DIAGNOSIS — Z7982 Long term (current) use of aspirin: Secondary | ICD-10-CM

## 2021-01-20 DIAGNOSIS — I482 Chronic atrial fibrillation, unspecified: Secondary | ICD-10-CM | POA: Diagnosis present

## 2021-01-20 DIAGNOSIS — R935 Abnormal findings on diagnostic imaging of other abdominal regions, including retroperitoneum: Secondary | ICD-10-CM | POA: Diagnosis not present

## 2021-01-20 DIAGNOSIS — Z79899 Other long term (current) drug therapy: Secondary | ICD-10-CM

## 2021-01-20 DIAGNOSIS — Z87891 Personal history of nicotine dependence: Secondary | ICD-10-CM

## 2021-01-20 DIAGNOSIS — M810 Age-related osteoporosis without current pathological fracture: Secondary | ICD-10-CM | POA: Diagnosis present

## 2021-01-20 DIAGNOSIS — Z823 Family history of stroke: Secondary | ICD-10-CM

## 2021-01-20 DIAGNOSIS — Z833 Family history of diabetes mellitus: Secondary | ICD-10-CM

## 2021-01-20 LAB — COMPREHENSIVE METABOLIC PANEL
ALT: 383 U/L — ABNORMAL HIGH (ref 0–44)
AST: 442 U/L — ABNORMAL HIGH (ref 15–41)
Albumin: 3.3 g/dL — ABNORMAL LOW (ref 3.5–5.0)
Alkaline Phosphatase: 187 U/L — ABNORMAL HIGH (ref 38–126)
Anion gap: 8 (ref 5–15)
BUN: 15 mg/dL (ref 8–23)
CO2: 21 mmol/L — ABNORMAL LOW (ref 22–32)
Calcium: 8.9 mg/dL (ref 8.9–10.3)
Chloride: 105 mmol/L (ref 98–111)
Creatinine, Ser: 1.12 mg/dL — ABNORMAL HIGH (ref 0.44–1.00)
GFR, Estimated: 50 mL/min — ABNORMAL LOW (ref >60)
Glucose, Bld: 142 mg/dL — ABNORMAL HIGH (ref 70–99)
Potassium: 4.5 mmol/L (ref 3.5–5.1)
Sodium: 134 mmol/L — ABNORMAL LOW (ref 135–145)
Total Bilirubin: 2.3 mg/dL — ABNORMAL HIGH (ref 0.3–1.2)
Total Protein: 7.2 g/dL (ref 6.5–8.1)

## 2021-01-20 LAB — CBC WITH DIFFERENTIAL/PLATELET
Abs Immature Granulocytes: 0.06 10*3/uL (ref 0.00–0.07)
Basophils Absolute: 0.1 10*3/uL (ref 0.0–0.1)
Basophils Relative: 0 %
Eosinophils Absolute: 0 10*3/uL (ref 0.0–0.5)
Eosinophils Relative: 0 %
HCT: 42.1 % (ref 36.0–46.0)
Hemoglobin: 14.2 g/dL (ref 12.0–15.0)
Immature Granulocytes: 0 %
Lymphocytes Relative: 8 %
Lymphs Abs: 1.2 10*3/uL (ref 0.7–4.0)
MCH: 29.2 pg (ref 26.0–34.0)
MCHC: 33.7 g/dL (ref 30.0–36.0)
MCV: 86.4 fL (ref 80.0–100.0)
Monocytes Absolute: 1.2 10*3/uL — ABNORMAL HIGH (ref 0.1–1.0)
Monocytes Relative: 9 %
Neutro Abs: 11.9 10*3/uL — ABNORMAL HIGH (ref 1.7–7.7)
Neutrophils Relative %: 83 %
Platelets: 182 10*3/uL (ref 150–400)
RBC: 4.87 MIL/uL (ref 3.87–5.11)
RDW: 15.9 % — ABNORMAL HIGH (ref 11.5–15.5)
WBC: 14.4 10*3/uL — ABNORMAL HIGH (ref 4.0–10.5)
nRBC: 0 % (ref 0.0–0.2)

## 2021-01-20 LAB — D-DIMER, QUANTITATIVE: D-Dimer, Quant: 1.41 ug/mL-FEU — ABNORMAL HIGH (ref 0.00–0.50)

## 2021-01-20 LAB — BILIRUBIN, FRACTIONATED(TOT/DIR/INDIR)
Bilirubin, Direct: 1 mg/dL — ABNORMAL HIGH (ref 0.0–0.2)
Indirect Bilirubin: 1.6 mg/dL — ABNORMAL HIGH (ref 0.3–0.9)
Total Bilirubin: 2.6 mg/dL — ABNORMAL HIGH (ref 0.3–1.2)

## 2021-01-20 LAB — URINALYSIS, ROUTINE W REFLEX MICROSCOPIC
Bilirubin Urine: NEGATIVE
Glucose, UA: NEGATIVE mg/dL
Ketones, ur: NEGATIVE mg/dL
Leukocytes,Ua: NEGATIVE
Nitrite: NEGATIVE
Protein, ur: NEGATIVE mg/dL
Specific Gravity, Urine: 1.01 (ref 1.005–1.030)
pH: 8 (ref 5.0–8.0)

## 2021-01-20 LAB — LIPASE, BLOOD: Lipase: 22 U/L (ref 11–51)

## 2021-01-20 LAB — PROTIME-INR
INR: 1.8 — ABNORMAL HIGH (ref 0.8–1.2)
Prothrombin Time: 20.7 seconds — ABNORMAL HIGH (ref 11.4–15.2)

## 2021-01-20 LAB — URINALYSIS, MICROSCOPIC (REFLEX)

## 2021-01-20 LAB — BRAIN NATRIURETIC PEPTIDE: B Natriuretic Peptide: 1059.5 pg/mL — ABNORMAL HIGH (ref 0.0–100.0)

## 2021-01-20 LAB — TROPONIN I (HIGH SENSITIVITY)
Troponin I (High Sensitivity): 74 ng/L — ABNORMAL HIGH (ref <18)
Troponin I (High Sensitivity): 55 ng/L — ABNORMAL HIGH (ref <18)

## 2021-01-20 LAB — RESP PANEL BY RT-PCR (FLU A&B, COVID) ARPGX2
Influenza A by PCR: NEGATIVE
Influenza B by PCR: NEGATIVE
SARS Coronavirus 2 by RT PCR: NEGATIVE

## 2021-01-20 MED ORDER — GABAPENTIN 300 MG PO CAPS
300.0000 mg | ORAL_CAPSULE | Freq: Every day | ORAL | Status: DC
  Administered 2021-01-20 – 2021-01-23 (×4): 300 mg via ORAL
  Filled 2021-01-20 (×4): qty 1

## 2021-01-20 MED ORDER — BUSPIRONE HCL 10 MG PO TABS
10.0000 mg | ORAL_TABLET | Freq: Two times a day (BID) | ORAL | Status: DC
  Administered 2021-01-20 – 2021-01-23 (×6): 10 mg via ORAL
  Filled 2021-01-20 (×10): qty 1

## 2021-01-20 MED ORDER — FENTANYL CITRATE (PF) 100 MCG/2ML IJ SOLN
25.0000 ug | Freq: Once | INTRAMUSCULAR | Status: AC
  Administered 2021-01-20: 25 ug via INTRAVENOUS
  Filled 2021-01-20: qty 2

## 2021-01-20 MED ORDER — FUROSEMIDE 10 MG/ML IJ SOLN
40.0000 mg | Freq: Once | INTRAMUSCULAR | Status: AC
  Administered 2021-01-20: 40 mg via INTRAVENOUS
  Filled 2021-01-20: qty 4

## 2021-01-20 MED ORDER — SODIUM CHLORIDE 0.9 % IV BOLUS
500.0000 mL | Freq: Once | INTRAVENOUS | Status: AC
  Administered 2021-01-20: 500 mL via INTRAVENOUS

## 2021-01-20 MED ORDER — IOHEXOL 350 MG/ML SOLN
100.0000 mL | Freq: Once | INTRAVENOUS | Status: AC | PRN
  Administered 2021-01-20: 100 mL via INTRAVENOUS

## 2021-01-20 MED ORDER — IBUPROFEN 800 MG PO TABS
800.0000 mg | ORAL_TABLET | Freq: Once | ORAL | Status: AC
  Administered 2021-01-20: 800 mg via ORAL
  Filled 2021-01-20: qty 1

## 2021-01-20 MED ORDER — PIPERACILLIN-TAZOBACTAM 3.375 G IVPB 30 MIN
3.3750 g | Freq: Once | INTRAVENOUS | Status: AC
  Administered 2021-01-20: 3.375 g via INTRAVENOUS
  Filled 2021-01-20: qty 50

## 2021-01-20 MED ORDER — ONDANSETRON HCL 4 MG/2ML IJ SOLN
4.0000 mg | Freq: Once | INTRAMUSCULAR | Status: AC
  Administered 2021-01-20: 4 mg via INTRAVENOUS
  Filled 2021-01-20: qty 2

## 2021-01-20 MED ORDER — METOPROLOL TARTRATE 25 MG PO TABS
25.0000 mg | ORAL_TABLET | Freq: Two times a day (BID) | ORAL | Status: DC
  Administered 2021-01-20: 25 mg via ORAL
  Filled 2021-01-20: qty 1

## 2021-01-20 NOTE — ED Triage Notes (Signed)
Vomiting since last night. She feels nervous. No active vomiting at this time.

## 2021-01-20 NOTE — ED Provider Notes (Addendum)
Jill Crane   CSN: [id number] Arrival date & time: 01/20/21  1250     History Chief Complaint  Patient presents with   Emesis    Jill Crane is a 80 y.o. female with past medical history significant for Afib, CHF, COPD, DM, blood clots who presents for evaluation of multiple complaints. Patient states multiple episodes of NBNB emesis since last night. None this afternoon. Unsure last BM however denies melena or BRBPR. She feels like she has a non-productive cough. Feels intermittently SOB. Not necessarily exertional in nature. Has chest tightness when coughing. However with some baseline pressure to right chest, abdomen. No fever, HA, chills, hemoptysis, dysuria, LE edema, numbness. Stomach feels "off" however denies overt pain. Points to upper abdomen to the area where it feels "off." Feels generally fatigued. Has not taken anything for symptoms. No congestion, rhinorrhea, sore throat. No sick contacts. Has not missed any doses of anticoagulation. Denies additional aggravating or alleviating factors.  History obtained from patient, daughter in room and past medical records. No interpretor was used.  HPI     Past Medical History:  Diagnosis Date   Atrial fibrillation with normal ventricular rate (HCC)    CHF (congestive heart failure) (HCC)    COPD (chronic obstructive pulmonary disease) ([place])    Diabetes mellitus without complication ([place])    Hx of blood clots    Hypertension     Patient Active Problem List   Diagnosis Date Noted   Cholecystitis 01/20/2021   SIRS (systemic inflammatory response syndrome) ([place]) 08/15/2020   Acute lower UTI 08/15/2020   A-fib ([place]) 08/15/2020   Diabetes ([place]) 08/15/2020   Essential hypertension 08/15/2020   Sepsis ([place]) 11/10/2019    History reviewed. No pertinent surgical history.   OB History   No obstetric history on file.     No family history on file.  Social History   Tobacco  Use   Smoking status: Former    Pack years: 0.00   Smokeless tobacco: Never  Substance Use Topics   Alcohol use: No    Home Medications Prior to Admission medications   Medication Sig Start Date End Date Taking? Authorizing Provider  albuterol (PROVENTIL HFA;VENTOLIN HFA) 108 (90 BASE) MCG/ACT inhaler Inhale 2 puffs into the lungs every 6 (six) hours as needed for wheezing or shortness of breath.    [provider]  alendronate (FOSAMAX) 35 MG tablet Take 35 mg by mouth every 7 (seven) days. Take with a full glass of water on an empty stomach.    [provider]  aspirin 81 MG chewable tablet Chew 81 mg by mouth daily.    [provider]  bimatoprost (LUMIGAN) 0.01 % SOLN Place 1 drop into both eyes at bedtime. 11/04/19   [provider]  brimonidine (ALPHAGAN) 0.2 % ophthalmic solution Place 1 drop into the left eye in the morning and at bedtime. 10/21/19   [provider]  busPIRone (BUSPAR) 10 MG tablet Take 10 mg by mouth 2 (two) times daily. 08/04/20   [provider]  calcium-vitamin D (OSCAL WITH D) 500-200 MG-UNIT per tablet Take 1 tablet by mouth.    [provider]  dorzolamide (TRUSOPT) 2 % ophthalmic solution Place 1 drop into both eyes at bedtime. 08/03/20   [provider]  furosemide (LASIX) 20 MG tablet Take 40 mg (2 tablets) tomorrow and the day after, then continue with 20 mg daily Patient taking differently: Take 20 mg by mouth  daily. 04/15/17   Margarita Mail, PA-C  gabapentin (NEURONTIN) 300 MG capsule Take 300 mg by mouth at bedtime. 08/10/20   [provider]  magnesium oxide (MAG-OX) 400 (241.3 Mg) MG tablet Take 1 tablet by mouth daily. 07/08/20   [provider]  MATZIM LA 240 MG 24 hr tablet Take 240 mg by mouth daily. 08/03/20   [provider]  metoprolol tartrate (LOPRESSOR) 25 MG tablet Take 1 tablet (25 mg total) by mouth 2 (two) times daily. 08/17/20   Nita Sells, MD  omeprazole (PRILOSEC) 20 MG capsule Take 20 mg by mouth daily.    [provider]  predniSONE (DELTASONE) 10 MG tablet Take 0.5 tablets (5 mg total) by mouth daily with breakfast. 08/17/20   Nita Sells, MD  warfarin (COUMADIN) 2 MG tablet Take 2-4 mg by mouth as directed. Take 1 tablet (2 mg) daily except Take 2 tablets (4 mg) on (Mon & Fri) 06/02/20   [provider]    Allergies    Patient has no known allergies.  Review of Systems   Review of Systems  Constitutional:  Positive for fatigue. Negative for activity change, appetite change, chills, diaphoresis, fever and unexpected weight change.  HENT: Negative.    Respiratory:  Positive for cough and shortness of breath. Negative for apnea, choking, chest tightness, wheezing and stridor.   Cardiovascular: Negative.   Gastrointestinal:  Positive for abdominal pain ("odd" sensation), nausea and vomiting. Negative for anal bleeding, blood in stool, constipation, diarrhea and rectal pain.  Genitourinary: Negative.   Musculoskeletal: Negative.   Neurological: Negative.   All other systems reviewed and are negative.  Physical Exam Updated Vital Signs BP 125/74   Pulse 69   Temp 98.3 F (36.8 C) (Oral)   Resp 18   Ht _0  (1.651 m)   Wt 76.7 kg   SpO2 96%   BMI 28.14 kg/m   Physical Exam Vitals and nursing Crane reviewed.  Constitutional:      General: She is not in acute distress.    Appearance: She is well-developed. She is not ill-appearing, toxic-appearing or diaphoretic.  HENT:     Head: Normocephalic and atraumatic.     Nose: Nose normal.     Mouth/Throat:     Mouth: Mucous membranes are moist.  Eyes:     Pupils: Pupils are equal, round, and reactive to light.  Cardiovascular:     Rate and Rhythm: Normal rate.     Pulses:          Radial pulses are 2+ on the right side and 2+ on the left side.       Dorsalis pedis pulses are 1+ on the right side and 1+ on the left side.      Heart sounds: Normal heart sounds.  Pulmonary:     Effort: Pulmonary effort is normal. No respiratory distress.     Breath sounds: Normal breath sounds and air entry.     Comments: Clear bilaterally, Speaks in full sentences without difficulty Chest:       Comments: Equal rise and fall to chest wall. Abdominal:     General: Bowel sounds are normal. There is no distension.     Tenderness: There is abdominal tenderness. There is no right CVA tenderness, left CVA tenderness, guarding or rebound.    Musculoskeletal:        General: Normal range of motion.     Cervical back: Normal range of motion.     Comments:  No bony tenderness. Moves all 4 extremities without difficulty.  Skin:    General: Skin is warm and dry.     Capillary Refill: Capillary refill takes less than 2 seconds.     Comments: Varicose veins to LLE. No erythema, warmth, edema  Neurological:     General: No focal deficit present.     Mental Status: She is alert and oriented to person, place, and time.  Psychiatric:        Mood and Affect: Mood normal.    ED Results / Procedures / Treatments   Labs (all labs ordered are listed, but only abnormal results are displayed) Labs Reviewed  CBC WITH DIFFERENTIAL/PLATELET - Abnormal; Notable for the following components:      Result Value   WBC 14.4 (*)    RDW 15.9 (*)    Neutro Abs 11.9 (*)    Monocytes Absolute 1.2 (*)    All other components within normal limits  COMPREHENSIVE METABOLIC PANEL - Abnormal; Notable for the following components:   Sodium 134 (*)    CO2 21 (*)    Glucose, Bld 142 (*)    Creatinine, Ser 1.12 (*)    Albumin 3.3 (*)    AST 442 (*)    ALT 383 (*)    Alkaline Phosphatase 187 (*)    Total Bilirubin 2.3 (*)    GFR, Estimated 50 (*)    All other components within normal limits  URINALYSIS, ROUTINE W REFLEX MICROSCOPIC - Abnormal; Notable for the following components:   Hgb urine dipstick TRACE (*)    All other components within normal  limits  BRAIN NATRIURETIC PEPTIDE - Abnormal; Notable for the following components:   B Natriuretic Peptide 1,059.5 (*)    All other components within normal limits  PROTIME-INR - Abnormal; Notable for the following components:   Prothrombin Time 20.7 (*)    INR 1.8 (*)    All other components within normal limits  D-DIMER, QUANTITATIVE (NOT AT [facility]) - Abnormal; Notable for the following components:   D-Dimer, Quant 1.41 (*)    All other components within normal limits  URINALYSIS, MICROSCOPIC (REFLEX) - Abnormal; Notable for the following components:   Bacteria, UA FEW (*)    All other components within normal limits  TROPONIN I (HIGH SENSITIVITY) - Abnormal; Notable for the following components:   Troponin I (High Sensitivity) 74 (*)    All other components within normal limits  TROPONIN I (HIGH SENSITIVITY) - Abnormal; Notable for the following components:   Troponin I (High Sensitivity) 55 (*)    All other components within normal limits  RESP PANEL BY RT-PCR (FLU A&B, COVID) [id number]  URINE CULTURE  LIPASE, BLOOD    EKG EKG Interpretation  Date/Time:  Thursday January 20 2021 13:16:16 EDT Ventricular Rate:  68 PR Interval:    QRS Duration: 83 QT Interval:  364 QTC Calculation: 388 R Axis:   3 Text Interpretation: Atrial fibrillation Anteroseptal infarct, old Confirmed by Fredia Sorrow [phone] on 01/20/2021 1:29:49 PM  Radiology CT Angio Chest PE W/Cm &/Or Wo Cm  Result Date: 01/20/2021 CLINICAL DATA:  PE suspected, low/intermediate prob, positive D-dimer Vomiting since last night. EXAM: CT ANGIOGRAPHY CHEST WITH CONTRAST TECHNIQUE: Multidetector CT imaging of the chest was performed using the standard protocol during bolus administration of intravenous contrast. Multiplanar CT image reconstructions and MIPs were obtained to evaluate the vascular anatomy. CONTRAST:  153m OMNIPAQUE IOHEXOL 350 MG/ML SOLN COMPARISON:  Radiograph earlier today.  Chest CTA 11/10/2019 FINDINGS:  Cardiovascular: There are no filling defects within the pulmonary arteries to suggest pulmonary embolus. Diffuse aortic atherosclerosis and tortuosity. No aortic aneurysm. Cannot assess for dissection given phase of contrast tailored to pulmonary artery evaluation. There are coronary artery calcifications. Mild cardiomegaly. No pericardial effusion. Mediastinum/Nodes: No enlarged mediastinal or hilar lymph nodes. Decompressed esophagus. Small hiatal hernia. No thyroid nodule. Lungs/Pleura: No acute airspace disease. No pleural effusion. No pulmonary edema. No pulmonary nodule or mass. The trachea and central bronchi are patent. Upper Abdomen: Cyst on concurrent abdominopelvic CT, reported separately. Musculoskeletal: Moderate thoracic scoliosis. 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 There are no acute or suspicious osseous abnormalities. Review of the MIP images confirms the above findings. IMPRESSION: 1. No pulmonary embolus or acute intrathoracic abnormality. 2. Diffuse aortic atherosclerosis and tortuosity. Coronary artery calcifications. Mild cardiomegaly. Aortic Atherosclerosis (ICD10-I70.0). Electronically Signed   By: Keith Rake M.D.   On: 01/20/2021 16:51   CT Abdomen Pelvis W Contrast  Result Date: 01/20/2021 CLINICAL DATA:  Nausea and vomiting. EXAM: CT ABDOMEN AND PELVIS WITH CONTRAST TECHNIQUE: Multidetector CT imaging of the abdomen and pelvis was performed using the standard protocol following bolus administration of intravenous contrast. CONTRAST:  145m OMNIPAQUE IOHEXOL 350 MG/ML SOLN COMPARISON:  Abdominopelvic CT 11/09/2020 FINDINGS: Lower chest: Assessed on concurrent chest CT, reported separately. Hepatobiliary: Previous subcentimeter low-density in the lateral right liver is not well seen on the current exam. Multiple stones in the gallbladder with gallbladder distension and mild wall thickening/pericholecystic edema, for example series 7, image 62. There is intra and  extrahepatic biliary ductal dilatation with noncalcified filling defects in the common bile duct, for example series 4, image 30 and 33. Common bile duct measures 10 mm. Pancreas: Parenchymal atrophy. No ductal dilatation or inflammation. Spleen: Normal in size without focal abnormality. Adrenals/Urinary Tract: Normal adrenal glands. 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 There is no hydronephrosis or perinephric edema. Symmetric renal excretion on delayed phase imaging. Stable cyst in the lower left kidney. Unremarkable urinary bladder. Stomach/Bowel: Tiny hiatal hernia. Decompressed stomach. No small bowel obstruction or inflammation normal appendix. Multifocal colonic diverticulosis, most prominently affecting the left colon, without diverticulitis no colonic inflammation. Vascular/Lymphatic: Advanced aortic atherosclerosis. Advanced aortic branch atherosclerosis. 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 Portal vein is patent. No portal venous or mesenteric gas. Infrarenal IVC filter in place. No enlarged lymph nodes in the abdomen or pelvis. Reproductive: Atrophic uterus, normal for age. Quiescent ovaries. No adnexal mass. Other: No ascites, free air or focal fluid collection. Musculoskeletal: No acute osseous abnormalities are seen. 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 Prominent lumbar facet hypertrophy. IMPRESSION: 1. Cholelithiasis with gallbladder distension and mild wall thickening/pericholecystic edema, suspicious for acute cholecystitis. 2. Intra and extrahepatic biliary ductal dilatation with noncalcified filling defects in the common bile duct, suspicious for choledocholithiasis. 3. Colonic diverticulosis without diverticulitis. Aortic Atherosclerosis (ICD10-I70.0). Electronically Signed   By: MKeith RakeM.D.   On: 01/20/2021 16:57   DG Chest Portable 1 View  Result Date: 01/20/2021 CLINICAL DATA:  Cough shortness of breath,  LEFT-sided pain since last night. EXAM: PORTABLE CHEST 1 VIEW COMPARISON:  August 15, 2020. FINDINGS: EKG leads project over the chest. Cardiomediastinal contours show persistent cardiac enlargement and fullness of central pulmonary vasculature. No frank edema. No consolidation. No pleural effusion on frontal radiograph. On limited assessment no acute skeletal process. IMPRESSION: Cardiomegaly and central vascular congestion without frank edema. Similar to prior chest x-ray. Electronically Signed   By:  Zetta Bills M.D.   On: 01/20/2021 15:16   US Abdomen Limited RUQ (LIVER/GB)  Result Date: 01/20/2021 CLINICAL DATA:  Right upper quadrant pain with vomiting EXAM: ULTRASOUND ABDOMEN LIMITED RIGHT UPPER QUADRANT COMPARISON:  CT 01/20/2021 FINDINGS: Gallbladder: Small stones in the gallbladder. Gallbladder is distended. Slight increased wall thickness up to 6 mm but no sonographic Murphy. Common bile duct: Diameter: 5.6 mm Liver: No focal lesion identified. Within normal limits in parenchymal echogenicity. Portal vein is patent on color Doppler imaging with normal direction of blood flow towards the liver. Other: None. IMPRESSION: 1. Distended gallbladder with stones and slight wall thickening but negative sonographic Murphy. Correlation with nuclear medicine hepatobiliary imaging could be obtained if continued concern for acute cholecystitis Electronically Signed   By: Donavan Foil M.D.   On: 01/20/2021 18:42    Procedures .Critical Care  Date/Time: 01/20/2021 6:47 PM Performed by: Nettie Elm, PA-C Authorized by: Nettie Elm, PA-C   Critical care provider statement:    Critical care time (minutes):  45   Critical care was necessary to treat or prevent imminent or life-threatening deterioration of the following conditions:  Cardiac failure and circulatory failure   Critical care was time spent personally by me on the following activities:  Discussions with consultants, evaluation of  patient's response to treatment, examination of patient, ordering and performing treatments and interventions, ordering and review of laboratory studies, ordering and review of radiographic studies, pulse oximetry, re-evaluation of patient's condition, obtaining history from patient or surrogate and review of old charts   Medications Ordered in ED Medications  furosemide (LASIX) injection 40 mg (has no administration in time range)  sodium chloride 0.9 % bolus 500 mL (0 mLs Intravenous Stopped 01/20/21 1753)  ondansetron (ZOFRAN) injection 4 mg (4 mg Intravenous Given 01/20/21 1455)  fentaNYL (SUBLIMAZE) injection 25 mcg (25 mcg Intravenous Given 01/20/21 1456)  iohexol (OMNIPAQUE) 350 MG/ML injection 100 mL (100 mLs Intravenous Contrast Given 01/20/21 1622)  piperacillin-tazobactam (ZOSYN) IVPB 3.375 g (0 g Intravenous Stopped 01/20/21 1754)    ED Course  I have reviewed the triage vital signs and the nursing notes.  Pertinent labs & imaging results that were available during my care of the patient were reviewed by me and considered in my medical decision making (see chart for details).  Here for multiple complaints.  She is afebrile, nonseptic however does appear chronically ill.  GEN she has had some right-sided chest pain, abdominal pain and shortness of breath and some emesis over the last few days.  She is diffusely tender.  History of DVT, A. fib chronically anticoagulated on Coumadin no missed doses.  Plan on labs, imaging and reassess  Labs and imaging personally reviewed and interpreted:  CBC with leukocytosis at 17.0 Metabolic panel with creatinine 1.12, transaminitis with elevated alk phos, T bili D-dimer 1.41 Troponin 74, up from baseline, repeat 55 Lipase 22 BNP 1059 COVID, flu negative UA negative for infection Chest x-ray with cardiomegaly, similar to prior CT angio chest without evidence of PE, infectious process, pulmonary edema CT abdomen pelvis with suspicion for  cholecystitis as well as intrahepatic and extrahepatic common bile duct dilation suspicion for choledocholithiasis  Patient does not appear septic.  She was given Zosyn.  Her pain and nausea are controlled.  No emesis here in ED.  Unclear why her troponin is elevated as symptoms do not seem consistent with ACS.  We will plan on repeat.  Her EKG does not show any ischemic changes.  CONSULT with Dr. Ninfa Linden with General surgery. Needs GI consult for possible ERCP/ MRCP, Medicine admit. Will see in consult. Abx given in ED  CONSULT with Dr. Michail Sermon with GI. Will see in consult tomorrow. Continue Abx.   CONSULT with Dr. Roel Cluck with [facility] who is agreeable with admission.  The patient appears reasonably stabilized for admission considering the current resources, flow, and capabilities available in the ED at this time, and I doubt any other [facility] requiring further screening and/or treatment in the ED prior to admission.   ADDEND: Patient febrile to 102.1, she is already getting antibiotics for intra-abdominal source of infection.  Will give antipyretic. Concern for ascending cholangitis due to likely biliary stone, febrile, source of infection.    MDM Rules/Calculators/A&P                           Final Clinical Impression(s) / ED Diagnoses Final diagnoses:  Emesis  Elevated LFTs  Cholecystitis  Choledocholithiasis  Elevated troponin  Elevated brain natriuretic peptide (BNP) level    Rx / DC Orders ED Discharge Orders     None        Jaymond Waage A, PA-C 01/20/21 1848    Salina Stanfield A, PA-C 01/20/21 2127    Fredia Sorrow, MD 01/21/21 [phone]

## 2021-01-20 NOTE — ED Notes (Signed)
Patient transported to CT 

## 2021-01-21 ENCOUNTER — Encounter (HOSPITAL_COMMUNITY): Payer: Self-pay | Admitting: Internal Medicine

## 2021-01-21 DIAGNOSIS — R112 Nausea with vomiting, unspecified: Secondary | ICD-10-CM

## 2021-01-21 DIAGNOSIS — E119 Type 2 diabetes mellitus without complications: Secondary | ICD-10-CM

## 2021-01-21 DIAGNOSIS — K8050 Calculus of bile duct without cholangitis or cholecystitis without obstruction: Secondary | ICD-10-CM

## 2021-01-21 DIAGNOSIS — Z7901 Long term (current) use of anticoagulants: Secondary | ICD-10-CM

## 2021-01-21 DIAGNOSIS — R1011 Right upper quadrant pain: Secondary | ICD-10-CM

## 2021-01-21 DIAGNOSIS — I10 Essential (primary) hypertension: Secondary | ICD-10-CM

## 2021-01-21 DIAGNOSIS — R935 Abnormal findings on diagnostic imaging of other abdominal regions, including retroperitoneum: Secondary | ICD-10-CM

## 2021-01-21 DIAGNOSIS — R7989 Other specified abnormal findings of blood chemistry: Secondary | ICD-10-CM

## 2021-01-21 DIAGNOSIS — I482 Chronic atrial fibrillation, unspecified: Secondary | ICD-10-CM

## 2021-01-21 DIAGNOSIS — Z7952 Long term (current) use of systemic steroids: Secondary | ICD-10-CM

## 2021-01-21 DIAGNOSIS — K819 Cholecystitis, unspecified: Secondary | ICD-10-CM

## 2021-01-21 LAB — GLUCOSE, CAPILLARY
Glucose-Capillary: 155 mg/dL — ABNORMAL HIGH (ref 70–99)
Glucose-Capillary: 67 mg/dL — ABNORMAL LOW (ref 70–99)
Glucose-Capillary: 99 mg/dL (ref 70–99)
Glucose-Capillary: 86 mg/dL (ref 70–99)
Glucose-Capillary: 175 mg/dL — ABNORMAL HIGH (ref 70–99)
Glucose-Capillary: 246 mg/dL — ABNORMAL HIGH (ref 70–99)

## 2021-01-21 LAB — CBC
HCT: 44.6 % (ref 36.0–46.0)
Hemoglobin: 14.5 g/dL (ref 12.0–15.0)
MCH: 28.4 pg (ref 26.0–34.0)
MCHC: 32.5 g/dL (ref 30.0–36.0)
MCV: 87.3 fL (ref 80.0–100.0)
Platelets: 154 10*3/uL (ref 150–400)
RBC: 5.11 MIL/uL (ref 3.87–5.11)
RDW: 15.9 % — ABNORMAL HIGH (ref 11.5–15.5)
WBC: 12 10*3/uL — ABNORMAL HIGH (ref 4.0–10.5)
nRBC: 0 % (ref 0.0–0.2)

## 2021-01-21 LAB — PROTIME-INR
INR: 1.6 — ABNORMAL HIGH (ref 0.8–1.2)
Prothrombin Time: 18.9 seconds — ABNORMAL HIGH (ref 11.4–15.2)

## 2021-01-21 LAB — COMPREHENSIVE METABOLIC PANEL
ALT: 309 U/L — ABNORMAL HIGH (ref 0–44)
AST: 247 U/L — ABNORMAL HIGH (ref 15–41)
Albumin: 3.1 g/dL — ABNORMAL LOW (ref 3.5–5.0)
Alkaline Phosphatase: 173 U/L — ABNORMAL HIGH (ref 38–126)
Anion gap: 13 (ref 5–15)
BUN: 18 mg/dL (ref 8–23)
CO2: 18 mmol/L — ABNORMAL LOW (ref 22–32)
Calcium: 9.1 mg/dL (ref 8.9–10.3)
Chloride: 105 mmol/L (ref 98–111)
Creatinine, Ser: 1.56 mg/dL — ABNORMAL HIGH (ref 0.44–1.00)
GFR, Estimated: 34 mL/min — ABNORMAL LOW (ref >60)
Glucose, Bld: 67 mg/dL — ABNORMAL LOW (ref 70–99)
Potassium: 4.1 mmol/L (ref 3.5–5.1)
Sodium: 136 mmol/L (ref 135–145)
Total Bilirubin: 3 mg/dL — ABNORMAL HIGH (ref 0.3–1.2)
Total Protein: 6.4 g/dL — ABNORMAL LOW (ref 6.5–8.1)

## 2021-01-21 LAB — HEMOGLOBIN A1C
Hgb A1c MFr Bld: 7 % — ABNORMAL HIGH (ref 4.8–5.6)
Mean Plasma Glucose: 154.2 mg/dL

## 2021-01-21 MED ORDER — METOPROLOL TARTRATE 5 MG/5ML IV SOLN: 5.0000 mg | Freq: Four times a day (QID) | INTRAVENOUS | Status: DC | PRN

## 2021-01-21 MED ORDER — ONDANSETRON HCL 4 MG PO TABS: 4.0000 mg | ORAL_TABLET | Freq: Four times a day (QID) | ORAL | Status: DC | PRN

## 2021-01-21 MED ORDER — PREDNISONE 20 MG PO TABS
30.0000 mg | ORAL_TABLET | Freq: Every day | ORAL | Status: DC
  Administered 2021-01-21 – 2021-01-23 (×2): 30 mg via ORAL
  Filled 2021-01-21 (×3): qty 1

## 2021-01-21 MED ORDER — ACETAMINOPHEN 650 MG RE SUPP: 650.0000 mg | Freq: Four times a day (QID) | RECTAL | Status: DC | PRN

## 2021-01-21 MED ORDER — ASPIRIN EC 81 MG PO TBEC: 81.0000 mg | DELAYED_RELEASE_TABLET | Freq: Every day | ORAL | Status: DC

## 2021-01-21 MED ORDER — ONDANSETRON HCL 4 MG/2ML IJ SOLN
4.0000 mg | Freq: Four times a day (QID) | INTRAMUSCULAR | Status: DC | PRN
  Administered 2021-01-23: 4 mg via INTRAVENOUS
  Filled 2021-01-21: qty 2

## 2021-01-21 MED ORDER — METRONIDAZOLE 500 MG/100ML IV SOLN
500.0000 mg | Freq: Two times a day (BID) | INTRAVENOUS | Status: DC
  Administered 2021-01-21 – 2021-01-24 (×6): 500 mg via INTRAVENOUS
  Filled 2021-01-21 (×6): qty 100

## 2021-01-21 MED ORDER — SODIUM CHLORIDE 0.9 % IV SOLN: INTRAVENOUS | Status: DC

## 2021-01-21 MED ORDER — ACETAMINOPHEN 325 MG PO TABS
650.0000 mg | ORAL_TABLET | Freq: Four times a day (QID) | ORAL | Status: DC | PRN
  Administered 2021-01-21 – 2021-01-23 (×2): 650 mg via ORAL
  Filled 2021-01-21 (×3): qty 2

## 2021-01-21 MED ORDER — INSULIN ASPART 100 UNIT/ML IJ SOLN
0.0000 [IU] | INTRAMUSCULAR | Status: DC
  Administered 2021-01-21: 2 [IU] via SUBCUTANEOUS
  Administered 2021-01-21: 3 [IU] via SUBCUTANEOUS
  Administered 2021-01-22: 1 [IU] via SUBCUTANEOUS
  Administered 2021-01-22: 5 [IU] via SUBCUTANEOUS
  Administered 2021-01-23: 2 [IU] via SUBCUTANEOUS
  Administered 2021-01-23 (×2): 1 [IU] via SUBCUTANEOUS
  Administered 2021-01-23 (×2): 2 [IU] via SUBCUTANEOUS

## 2021-01-21 MED ORDER — MORPHINE SULFATE (PF) 2 MG/ML IV SOLN
2.0000 mg | INTRAVENOUS | Status: DC | PRN
  Administered 2021-01-22: 2 mg via INTRAVENOUS
  Filled 2021-01-21: qty 1

## 2021-01-21 MED ORDER — SODIUM CHLORIDE 0.9 % IV SOLN
2.0000 g | INTRAVENOUS | Status: DC
  Administered 2021-01-21 – 2021-01-23 (×3): 2 g via INTRAVENOUS
  Filled 2021-01-21: qty 20
  Filled 2021-01-21: qty 2
  Filled 2021-01-21: qty 20
  Filled 2021-01-21: qty 2
  Filled 2021-01-21: qty 20

## 2021-01-21 MED ORDER — PIPERACILLIN-TAZOBACTAM 3.375 G IVPB
3.3750 g | Freq: Three times a day (TID) | INTRAVENOUS | Status: DC
  Administered 2021-01-21: 3.375 g via INTRAVENOUS
  Filled 2021-01-21 (×3): qty 50

## 2021-01-21 MED ORDER — DEXTROSE 50 % IV SOLN
INTRAVENOUS | Status: AC
  Administered 2021-01-21: 25 mL
  Filled 2021-01-21: qty 50

## 2021-01-21 NOTE — Consult Note (Signed)
Jill Crane Jun 12, 1941  [id number].    Requesting MD: Dr. Maryfrances Bunnell Chief Complaint/Reason for Consult: Epigastric abdominal pain/choledocholithiasis with possible cholecystitis  HPI: Jill Crane is a 80 y.o. 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  Patient reports after eating some applesauce on Wednesday she began developing left upper quadrant and epigastric abdominal pain that was moderate in severity, constant and associate with nausea and vomiting.  Symptoms resolved after a few hours.  She reports last night after eating dinner her symptoms recurred which prompted her to present to the ED for evaluation.  She did not try anything for this at home.  She is unsure if anything made this better or worse.  She reports similar symptoms that were self resolving approximately 1 year ago.  Since presentation she has had a temperature of 102.1, tachycardia to 116 and soft BP of 95/56.  Fever and tachycardia have resolved.  CT A/P with cholelithiasis with gallbladder distention, mild gallbladder wall thickening, pericholecystic edema suspicious for acute cholecystitis.  CT also showed intra and extrahepatic biliary ductal dilation with noncalcified filling defects suspicious for choledocholithiasis.  Patient was admitted to [facility].  She was started on antibiotics.  GI was consulted and plans for ERCP tomorrow.  We were asked to see.  ROS: Review of Systems  Constitutional:  Positive for chills and fever.  Respiratory:  Positive for shortness of breath.   Cardiovascular:  Negative for chest pain.  Gastrointestinal:  Positive for abdominal pain, nausea and vomiting.  Psychiatric/Behavioral:  Negative for substance abuse.   All other systems  reviewed and are negative.  Family History  Problem Relation Age of Onset   Stroke Mother    Diabetes Mother    Stroke Father     Past Medical History:  Diagnosis Date   Atrial fibrillation with normal ventricular rate (HCC)    CHF (congestive heart failure) (HCC)    COPD (chronic obstructive pulmonary disease) (HCC)    Diabetes mellitus without complication (HCC)    Hx of blood clots    Hypertension     History reviewed. No pertinent surgical history. Denies prior abdominal surgeries   Social History:  reports that she has quit smoking. She has never used smokeless tobacco. She reports that she does not drink alcohol. No history on file for drug use. Patient reports she quit smoking in the last 10 years No alcohol use No illicit drug use Lives with [place] granddaughter in [place]ired  Allergies: No Known Allergies  Medications Prior to Admission  Medication Sig Dispense Refill   albuterol (PROVENTIL HFA;VENTOLIN HFA) 108 (90 BASE) MCG/ACT inhaler Inhale 2 puffs into the lungs every 6 (six) hours as needed for wheezing or shortness of breath.     alendronate (FOSAMAX) 35 MG tablet Take 35 mg by mouth every Monday. Take with a full glass of water on an empty stomach.     aspirin 81 MG chewable tablet Chew 81 mg by mouth daily.     brimonidine (ALPHAGAN) 0.2 % ophthalmic solution Place 1 drop into the left eye in the morning and at bedtime.     busPIRone (BUSPAR) 10 MG tablet Take 10 mg by mouth 2 (two)  times daily.     calcium-vitamin D (OSCAL WITH D) 500-200 MG-UNIT per tablet Take 1 tablet by mouth.     dorzolamide (TRUSOPT) 2 % ophthalmic solution Place 1 drop into both eyes at bedtime.     furosemide (LASIX) 20 MG tablet Take 40 mg (2 tablets) tomorrow and the day after, then continue with 20 mg daily (Patient taking differently: Take 20 mg by mouth daily.) 30 tablet 0   gabapentin (NEURONTIN) 300 MG capsule Take 300 mg by mouth at bedtime.     magnesium oxide  (MAG-OX) 400 (241.3 Mg) MG tablet Take 400 mg by mouth daily.     MATZIM LA 240 MG 24 hr tablet Take 240 mg by mouth daily.     metFORMIN (GLUCOPHAGE) 500 MG tablet Take 500-1,000 mg by mouth See admin instructions. Take 2 tablets with morning meal, and 1 tablet with evening meals     metoprolol tartrate (LOPRESSOR) 25 MG tablet Take 1 tablet (25 mg total) by mouth 2 (two) times daily.     omeprazole (PRILOSEC) 20 MG capsule Take 20 mg by mouth daily.     polyethylene glycol powder (GLYCOLAX/MIRALAX) 17 GM/SCOOP powder Take 17 g by mouth daily as needed for constipation.     predniSONE (DELTASONE) 2.5 MG tablet Take 10 mg by mouth daily with breakfast.     silver sulfADIAZINE (SILVADENE) 1 % cream Apply 1 application topically daily as needed for irritation. to scalp     warfarin (COUMADIN) 2 MG tablet Take 2-4 mg by mouth See admin instructions. Take 1 tablet (2 mg) daily except Take 2 tablets (4 mg) on (Tues & Fri)     predniSONE (DELTASONE) 10 MG tablet Take 0.5 tablets (5 mg total) by mouth daily with breakfast. (Patient not taking: No sig reported)       Physical Exam: Blood pressure (!) 96/58, pulse 90, temperature 97.7 F (36.5 C), temperature source Oral, resp. rate 16, height  (1.651 m), weight 76.7 kg, SpO2 96 %. General: pleasant, WD/WN AA female who is laying in bed in NAD HEENT: head is normocephalic, atraumatic.  Sclera are noninjected.  PERRL.  Ears and nose without any masses or lesions.  Mouth is pink and moist. Dentition fair Heart: regular, rate, and rhythm.  Normal s1,s2. No obvious murmurs, gallops, or rubs noted.  Palpable pedal pulses bilaterally  Lungs: CTAB, no wheezes, rhonchi, or rales noted.  Respiratory effort nonlabored Abd: Soft, ND, essential NT. No pain reported with light or deep palpation of the abdomen. +BS, no masses, hernias, or organomegaly MS: no BUE/BLE edema, calves soft and nontender Skin: warm and dry with no masses, lesions, or rashes Psych:  A&Ox4 with an appropriate affect Neuro: cranial nerves grossly intact, equal strength in BUE/BLE bilaterally, normal speech, thought process intact, moves all extremities, gait not assessed   Results for orders placed or performed during the hospital encounter of 01/20/21 (from the past 48 hour(s))  CBC with Differential     Status: Abnormal   Collection Time: 01/20/21  3:00 PM  Result Value Ref Range   WBC 14.4 (H) 4.0 - 10.5 K/uL   RBC 4.87 3.87 - 5.11 MIL/uL   Hemoglobin 14.2 12.0 - 15.0 g/dL   HCT 30.8 65.7 - 84.6 %   MCV 86.4 80.0 - 100.0 fL   MCH 29.2 26.0 - 34.0 pg   MCHC 33.7 30.0 - 36.0 g/dL   RDW 96.2 (H) 95.2 - 84.1 %   Platelets 182 150 -  400 K/uL   nRBC 0.0 0.0 - 0.2 %   Neutrophils Relative % 83 %   Neutro Abs 11.9 (H) 1.7 - 7.7 K/uL   Lymphocytes Relative 8 %   Lymphs Abs 1.2 0.7 - 4.0 K/uL   Monocytes Relative 9 %   Monocytes Absolute 1.2 (H) 0.1 - 1.0 K/uL   Eosinophils Relative 0 %   Eosinophils Absolute 0.0 0.0 - 0.5 K/uL   Basophils Relative 0 %   Basophils Absolute 0.1 0.0 - 0.1 K/uL   Immature Granulocytes 0 %   Abs Immature Granulocytes 0.06 0.00 - 0.07 K/uL    Comment: Performed at [facility] [place], [address] [place], [place] [address]  Comprehensive metabolic panel     Status: Abnormal   Collection Time: 01/20/21  3:00 PM  Result Value Ref Range   Sodium 134 (L) 135 - 145 mmol/L   Potassium 4.5 3.5 - 5.1 mmol/L   Chloride 105 98 - 111 mmol/L   CO2 21 (L) 22 - 32 mmol/L   Glucose, Bld 142 (H) 70 - 99 mg/dL    Comment: Glucose reference range applies only to samples taken after fasting for at least 8 hours.   BUN 15 8 - 23 mg/dL   Creatinine, Ser 4.091.12 (H) 0.44 - 1.00 mg/dL   Calcium 8.9 8.9 - 81.110.3 mg/dL   Total Protein 7.2 6.5 - 8.1 g/dL   Albumin 3.3 (L) 3.5 - 5.0 g/dL   AST 914442 (H) 15 - 41 U/L   ALT 383 (H) 0 - 44 U/L   Alkaline Phosphatase 187 (H) 38 - 126 U/L   Total Bilirubin 2.3 (H) 0.3 - 1.2 mg/dL   GFR, Estimated 50 (L) >60  mL/min    Comment: (NOTE) Calculated using the CKD-EPI Creatinine Equation (2021)    Anion gap 8 5 - 15    Comment: Performed at [facility] [place], [address] [place], [place] [address]  Troponin I (High Sensitivity)     Status: Abnormal   Collection Time: 01/20/21  3:00 PM  Result Value Ref Range   Troponin I (High Sensitivity) 74 (H) <18 ng/L    Comment: (NOTE) Elevated high sensitivity troponin I (hsTnI) values and significant  changes across serial measurements may suggest ACS but many other  chronic and acute conditions are known to elevate hsTnI results.  Refer to the "Links" section for chest pain algorithms and additional  guidance. Performed at [facility] [place], [address] [place], [place] [address]   Resp Panel by RT-PCR (Flu A&B, Covid) Nasopharyngeal Swab     Status: None   Collection Time: 01/20/21  3:00 PM   Specimen: Nasopharyngeal Swab; Nasopharyngeal(NP) swabs in vial transport medium  Result Value Ref Range   SARS Coronavirus 2 by RT PCR NEGATIVE NEGATIVE    Comment: (NOTE) SARS-CoV-2 target nucleic acids are NOT DETECTED.  The SARS-CoV-2 RNA is generally detectable in upper respiratory specimens during the acute phase of infection. The lowest concentration of SARS-CoV-2 viral copies this assay can detect is 138 copies/mL. A negative result does not preclude SARS-Cov-2 infection and should not be used as the sole basis for treatment or other patient management decisions. A negative result may occur with  improper specimen collection/handling, submission of specimen other than nasopharyngeal swab, presence of viral mutation(s) within the areas targeted by this assay, and inadequate number of viral copies(<138 copies/mL). A negative result must be combined with clinical observations, patient history,  and epidemiological information. The expected result is Negative.  Fact Sheet for Patients:   BloggerCourse.com  Fact Sheet for Healthcare Providers:  SeriousBroker.it  This test is no t yet approved or cleared by the [place] FDA and  has been authorized for detection and/or diagnosis of SARS-CoV-2 by FDA under an Emergency Use Authorization (EUA). This EUA will remain  in effect (meaning this test can be used) for the duration of the COVID-19 declaration under Section 564(b)(1) of the Act, 21 U.S.C.section 360bbb-3(b)(1), unless the authorization is terminated  or revoked sooner.       Influenza A by PCR NEGATIVE NEGATIVE   Influenza B by PCR NEGATIVE NEGATIVE    Comment: (NOTE) The Xpert Xpress SARS-CoV-2/FLU/RSV plus assay is intended as an aid in the diagnosis of influenza from Nasopharyngeal swab specimens and should not be used as a sole basis for treatment. Nasal washings and aspirates are unacceptable for Xpert Xpress SARS-CoV-2/FLU/RSV testing.  Fact Sheet for Patients: BloggerCourse.com  Fact Sheet for Healthcare Providers: SeriousBroker.it  This test is not yet approved or cleared by the [place] FDA and has been authorized for detection and/or diagnosis of SARS-CoV-2 by FDA under an Emergency Use Authorization (EUA). This EUA will remain in effect (meaning this test can be used) for the duration of the COVID-19 declaration under Section 564(b)(1) of the Act, 21 U.S.C. section 360bbb-3(b)(1), unless the authorization is terminated or revoked.  Performed at [facility], [address] [place], [place] [address]   Brain natriuretic peptide     Status: Abnormal   Collection Time: 01/20/21  3:00 PM  Result Value Ref Range   B Natriuretic Peptide 1,059.5 (H) 0.0 - 100.0 pg/mL    Comment: Performed at [facility], [address] [place], [place] [address]  Lipase, blood     Status: None   Collection Time: 01/20/21  3:00 PM   Result Value Ref Range   Lipase 22 11 - 51 U/L    Comment: Performed at [facility], [address] [place], [place] [address]  Protime-INR     Status: Abnormal   Collection Time: 01/20/21  3:00 PM  Result Value Ref Range   Prothrombin Time 20.7 (H) 11.4 - 15.2 seconds   INR 1.8 (H) 0.8 - 1.2    Comment: (NOTE) INR goal varies based on device and disease states. Performed at [facility], [address] [place], [place] [address]   D-dimer, quantitative     Status: Abnormal   Collection Time: 01/20/21  3:00 PM  Result Value Ref Range   D-Dimer, Quant 1.41 (H) 0.00 - 0.50 ug/mL-FEU    Comment: (NOTE) At the manufacturer cut-off value of 0.5 g/mL FEU, this assay has a negative predictive value of 95-100%.This assay is intended for use in conjunction with a clinical pretest probability (PTP) assessment model to exclude pulmonary embolism (PE) and deep venous thrombosis (DVT) in outpatients suspected of PE or DVT. Results should be correlated with clinical presentation. Performed at [facility], [address] [place], [place] [address]   Urinalysis, Routine w reflex microscopic     Status: Abnormal   Collection Time: 01/20/21  5:00 PM  Result Value Ref Range   Color, Urine YELLOW YELLOW   APPearance CLEAR CLEAR   Specific Gravity, Urine 1.010 1.005 - 1.030   pH 8.0 5.0 - 8.0   Glucose, UA NEGATIVE NEGATIVE mg/dL   Hgb  urine dipstick TRACE (A) NEGATIVE   Bilirubin Urine NEGATIVE NEGATIVE   Ketones, ur NEGATIVE NEGATIVE mg/dL   Protein, ur NEGATIVE NEGATIVE mg/dL   Nitrite NEGATIVE NEGATIVE   Leukocytes,Ua NEGATIVE NEGATIVE    Comment: Performed at [facility], [address] [place], [place] [address]  Urinalysis, Microscopic (reflex)     Status: Abnormal   Collection Time: 01/20/21  5:00 PM  Result Value Ref Range   RBC / HPF 0-5 0 - 5 RBC/hpf   WBC, UA 0-5 0 - 5 WBC/hpf   Bacteria, UA FEW (A) NONE SEEN   Squamous  Epithelial / LPF 6-10 0 - 5    Comment: Performed at [facility], [address] [place], [place] [address]  Troponin I (High Sensitivity)     Status: Abnormal   Collection Time: 01/20/21  5:40 PM  Result Value Ref Range   Troponin I (High Sensitivity) 55 (H) <18 ng/L    Comment: (NOTE) Elevated high sensitivity troponin I (hsTnI) values and significant  changes across serial measurements may suggest ACS but many other  chronic and acute conditions are known to elevate hsTnI results.  Refer to the "Links" section for chest pain algorithms and additional  guidance. Performed at [facility], [address] [place], [place] [address]   Bilirubin, fractionated(tot/dir/indir)     Status: Abnormal   Collection Time: 01/20/21  6:50 PM  Result Value Ref Range   Total Bilirubin 2.6 (H) 0.3 - 1.2 mg/dL   Bilirubin, Direct 1.0 (H) 0.0 - 0.2 mg/dL   Indirect Bilirubin 1.6 (H) 0.3 - 0.9 mg/dL    Comment: Performed at [facility], [address] [place], [place] [address]  Comprehensive metabolic panel     Status: Abnormal   Collection Time: 01/21/21  1:58 AM  Result Value Ref Range   Sodium 136 135 - 145 mmol/L   Potassium 4.1 3.5 - 5.1 mmol/L   Chloride 105 98 - 111 mmol/L   CO2 18 (L) 22 - 32 mmol/L   Glucose, Bld 67 (L) 70 - 99 mg/dL    Comment: Glucose reference range applies only to samples taken after fasting for at least 8 hours.   BUN 18 8 - 23 mg/dL   Creatinine, Ser 4.40 (H) 0.44 - 1.00 mg/dL   Calcium 9.1 8.9 - 34.7 mg/dL   Total Protein 6.4 (L) 6.5 - 8.1 g/dL   Albumin 3.1 (L) 3.5 - 5.0 g/dL   AST 425 (H) 15 - 41 U/L   ALT 309 (H) 0 - 44 U/L   Alkaline Phosphatase 173 (H) 38 - 126 U/L   Total Bilirubin 3.0 (H) 0.3 - 1.2 mg/dL   GFR, Estimated 34 (L) >60 mL/min    Comment: (NOTE) Calculated using the CKD-EPI Creatinine Equation (2021)    Anion gap 13 5 - 15    Comment: Performed at [facility], [address], [place],  [place] [address]  Protime-INR     Status: Abnormal   Collection Time: 01/21/21  2:26 AM  Result Value Ref Range   Prothrombin Time 18.9 (H) 11.4 - 15.2 seconds   INR 1.6 (H) 0.8 - 1.2    Comment: (NOTE) INR goal varies based on device and disease states. Performed at [facility], [address], [place], [place] [address]   CBC     Status: Abnormal   Collection Time: 01/21/21  2:26 AM  Result Value Ref Range   WBC 12.0 (H) 4.0 - 10.5 K/uL   RBC 5.11 3.87 - 5.11 MIL/uL   Hemoglobin 14.5 12.0 - 15.0 g/dL   HCT 16.1 09.6 - 04.5 %   MCV 87.3 80.0 - 100.0 fL   MCH 28.4 26.0 - 34.0 pg   MCHC 32.5 30.0 - 36.0 g/dL   RDW 40.9 (H) 81.1 - 91.4 %   Platelets 154 150 - 400 K/uL   nRBC 0.0 0.0 - 0.2 %    Comment: Performed at [facility], [address] [place], [place] [address]  Hemoglobin A1c     Status: Abnormal   Collection Time: 01/21/21  2:33 AM  Result Value Ref Range   Hgb A1c MFr Bld 7.0 (H) 4.8 - 5.6 %    Comment: (NOTE) Pre diabetes:          5.7%-6.4%  Diabetes:              >6.4%  Glycemic control for   <7.0% adults with diabetes    Mean Plasma Glucose 154.2 mg/dL    Comment: Performed at [facility], [address] [place], [place] [address]  Glucose, capillary     Status: Abnormal   Collection Time: 01/21/21  4:45 AM  Result Value Ref Range   Glucose-Capillary 67 (L) 70 - 99 mg/dL    Comment: Glucose reference range applies only to samples taken after fasting for at least 8 hours.   Comment 1 Notify RN   Glucose, capillary     Status: Abnormal   Collection Time: 01/21/21  5:23 AM  Result Value Ref Range   Glucose-Capillary 155 (H) 70 - 99 mg/dL    Comment: Glucose reference range applies only to samples taken after fasting for at least 8 hours.  Glucose, capillary     Status: None   Collection Time: 01/21/21  8:37 AM  Result Value Ref Range   Glucose-Capillary 99 70 - 99 mg/dL    Comment: Glucose reference range applies only to samples taken  after fasting for at least 8 hours.   CT Angio Chest PE W/Cm &/Or Wo Cm  Result Date: 01/20/2021 CLINICAL DATA:  PE suspected, low/intermediate prob, positive D-dimer Vomiting since last night. EXAM: CT ANGIOGRAPHY CHEST WITH CONTRAST TECHNIQUE: Multidetector CT imaging of the chest was performed using the standard protocol during bolus administration of intravenous contrast. Multiplanar CT image reconstructions and MIPs were obtained to evaluate the vascular anatomy. CONTRAST:  OMNIPAQUE IOHEXOL 350 MG/ML SOLN COMPARISON:  Radiograph earlier today.  Chest CTA 11/10/2019 FINDINGS: Cardiovascular: There are no filling defects within the pulmonary arteries to suggest pulmonary embolus. Diffuse aortic atherosclerosis and tortuosity. No aortic aneurysm. Cannot assess for dissection given phase of contrast tailored to pulmonary artery evaluation. There are coronary artery calcifications. Mild cardiomegaly. No pericardial effusion. Mediastinum/Nodes: No enlarged mediastinal or hilar lymph nodes. Decompressed esophagus. Small hiatal hernia. No thyroid nodule. Lungs/Pleura: No acute airspace disease. No pleural effusion. No pulmonary edema. No pulmonary nodule or mass. The trachea and central bronchi are patent. Upper Abdomen: Cyst on concurrent abdominopelvic CT, reported separately. Musculoskeletal: Moderate thoracic scoliosis. 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 There are no acute or suspicious osseous abnormalities. Review of the MIP images confirms the above findings. IMPRESSION: 1. No pulmonary embolus or acute intrathoracic abnormality. 2. Diffuse aortic atherosclerosis and tortuosity. Coronary artery calcifications. Mild cardiomegaly. Aortic Atherosclerosis (ICD10-I70.0). Electronically Signed   By: Ivette Loyal.D.  On: 01/20/2021 16:51   CT Abdomen Pelvis W Contrast  Result Date: 01/20/2021 CLINICAL DATA:  Nausea and vomiting. EXAM: CT ABDOMEN AND PELVIS WITH CONTRAST  TECHNIQUE: Multidetector CT imaging of the abdomen and pelvis was performed using the standard protocol following bolus administration of intravenous contrast. CONTRAST:  OMNIPAQUE IOHEXOL 350 MG/ML SOLN COMPARISON:  Abdominopelvic CT 11/09/2020 FINDINGS: Lower chest: Assessed on concurrent chest CT, reported separately. Hepatobiliary: Previous subcentimeter low-density in the lateral right liver is not well seen on the current exam. Multiple stones in the gallbladder with gallbladder distension and mild wall thickening/pericholecystic edema, for example series 7, image 62. There is intra and extrahepatic biliary ductal dilatation with noncalcified filling defects in the common bile duct, for example series 4, image 30 and 33. Common bile duct measures 10 mm. Pancreas: Parenchymal atrophy. No ductal dilatation or inflammation. Spleen: Normal in size without focal abnormality. Adrenals/Urinary Tract: Normal adrenal glands. 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 There is no hydronephrosis or perinephric edema. Symmetric renal excretion on delayed phase imaging. Stable cyst in the lower left kidney. Unremarkable urinary bladder. Stomach/Bowel: Tiny hiatal hernia. Decompressed stomach. No small bowel obstruction or inflammation normal appendix. Multifocal colonic diverticulosis, most prominently affecting the left colon, without diverticulitis no colonic inflammation. Vascular/Lymphatic: Advanced aortic atherosclerosis. Advanced aortic branch atherosclerosis. 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 Portal vein is patent. No portal venous or mesenteric gas. Infrarenal IVC filter in place. No enlarged lymph nodes in the abdomen or pelvis. Reproductive: Atrophic uterus, normal for age. Quiescent ovaries. No adnexal mass. Other: No ascites, free air or focal fluid collection. Musculoskeletal: No acute osseous abnormalities are seen. 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 Prominent lumbar facet hypertrophy. IMPRESSION: 1. Cholelithiasis with gallbladder distension and mild wall thickening/pericholecystic edema, suspicious for acute cholecystitis. 2. Intra and extrahepatic biliary ductal dilatation with noncalcified filling defects in the common bile duct, suspicious for choledocholithiasis. 3. Colonic diverticulosis without diverticulitis. Aortic Atherosclerosis (ICD10-I70.0). Electronically Signed   By: Narda Rutherford M.D.   On: 01/20/2021 16:57   DG Chest Portable 1 View  Result Date: 01/20/2021 CLINICAL DATA:  Cough shortness of breath, LEFT-sided pain since last night. EXAM: PORTABLE CHEST 1 VIEW COMPARISON:  August 15, 2020. FINDINGS: EKG leads project over the chest. Cardiomediastinal contours show persistent cardiac enlargement and fullness of central pulmonary vasculature. No frank edema. No consolidation. No pleural effusion on frontal radiograph. On limited assessment no acute skeletal process. IMPRESSION: Cardiomegaly and central vascular congestion without frank edema. Similar to prior chest x-ray. Electronically Signed   By: Donzetta Kohut M.D.   On: 01/20/2021 15:16   US Abdomen Limited RUQ (LIVER/GB)  Result Date: 01/20/2021 CLINICAL DATA:  Right upper quadrant pain with vomiting EXAM: ULTRASOUND ABDOMEN LIMITED RIGHT UPPER QUADRANT COMPARISON:  CT 01/20/2021 FINDINGS: Gallbladder: Small stones in the gallbladder. Gallbladder is distended. Slight increased wall thickness up to 6 mm but no sonographic Murphy. Common bile duct: Diameter: 5.6 mm Liver: No focal lesion identified. Within normal limits in parenchymal echogenicity. Portal vein is patent on color Doppler imaging with normal direction of blood flow towards the liver. Other: None. IMPRESSION: 1. Distended gallbladder with stones and slight wall thickening but negative sonographic Murphy. Correlation with nuclear medicine hepatobiliary imaging could be obtained if continued concern for  acute cholecystitis Electronically Signed   By: Jasmine Pang M.D.   On: 01/20/2021 18:42    Anti-infectives (From admission, onward)    Start  Dose/Rate Route Frequency Ordered Stop   01/21/21 0400  piperacillin-tazobactam (ZOSYN) IVPB 3.375 g        3.375 g 12.5 mL/hr over 240 Minutes Intravenous Every 8 hours 01/21/21 0249     01/20/21 1715  piperacillin-tazobactam (ZOSYN) IVPB 3.375 g        3.375 g 100 mL/hr over 30 Minutes Intravenous  Once 01/20/21 1711 01/20/21 1754        Assessment/Plan Choledocholithiasis Cholelithiasis with possible Cholecystitis  - GI plans for ERCP tomorrow - Continue to hold Coumadin. Last INR 1.6. Ideally would be < 1.5 before surgery. Will plan for Lap Chole after ERCP later this weekend/early next week depending on OR availability - Cont abx  - Would ask medical team to evaluate, determine if patient needs any additional workup and medically optimize before undergoing general anesthesia  - Cont to trend labs  - We will follow with you  FEN - CLD, npo at midnight, IVF per TRH VTE - SCDs, Coumadin on hold. INR  ID - Zosyn 7/7 >> Tmax 102.1. WBC 12  A. Fib on Chronic Coumadin - on hold Hx DVT on Chronic Coumadin - on hold. 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 On SSI HTN Hx of CHF - BNP > 1000 on admission. Mild cardiomegaly but no pulm edema on CT. On RA. Last Echo I can see in care everywhere from 06/18/18 has EF of 50-55% w/ no wall motion abn  COPD Aortic atherosclerosis   Coronary artery calcifications - noted on CT yesterday  Jacinto Halim, [facility] Surgery 01/21/2021, 9:55 AM Please see Amion for pager number during day hours 7:00am-4:30pm

## 2021-01-21 NOTE — Progress Notes (Signed)
PROGRESS NOTE    Jill Crane  JJK:[id number] DOB: February 20, 1941 DOA: 01/20/2021 PCP: Zoila Shutter, MD      Brief Narrative:  Jill Crane is a 80 y.o. F with AF on warfarin, DM, COPD, Pemphigus on steroids, and hx VTE who presentedw ith recurrent vomiting, Abdominal pain and epigastric/chest pain.    In the ER, AST/ALT >400, Tbili 2.3, WBC 14K.  Ct imaging suggested cholecystitis with choledocholithiasis.   Assessment & Plan:  Acute cholecystitis with possible choledocholithiasis on CT -NPO and IVF -Continue antibiotics, narrow to Unasyn -Hold warfarin -Continue IV morphine -Consult Gen Surg and GI, appreciate expertise   From the perioperative standpoint, she has RCRI risk 1 due to hx CHF, indicating an average risk of CV event for her age group.  Continue baby aspirin.  Her CHF and COPD are well-compensated, but obviously could be exacerbated post-operatively.  She has a mild bump in creatinine, which we will attend to.  Her chronic prednisone use may increase risk of post-operative infection  She is currently optimized for ERCP tomorrow or surgery.  Defer vitamin K and INR management to GI.      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   Difficult to place patient No              MDM: This is a no charge note.  For further details, please see H&P by my partner Dr. Julian Reil from earlier today.  The below labs and imaging reports were reviewed and summarized above.    DVT prophylaxis: N/A, she is chronically on warfarin, INR is 1.8 and she will quickly be restarted after procedure  Code Status: FULL Family Communication:            Subjective: Shoulder hurts.  Otherwise feels well.  No abdominal pain or confusion.  Vomited her icy pop.        Objective: Vitals:   01/20/21 2338 01/21/21 0234 01/21/21 0439 01/21/21 1211  BP: 99/63 97/68 (!) 96/58 93/62  Pulse: 78 93 90 82  Resp:  18 16 16   Temp: 98.3 F (36.8 C) 97.6 F (36.4 C) 97.7 F (36.5 C) (!) 97.5 F (36.4 C)  TempSrc:  Oral Oral Oral  SpO2: 96% 95% 96% 95%  Weight:      Height:        Intake/Output Summary (Last 24 hours) at 01/21/2021 1409 Last data filed at 01/20/2021 1754 Gross per 24 hour  Intake 578.09 ml  Output --  Net 578.09 ml   Filed Weights   01/20/21 1258  Weight: 76.7 kg  Examination: The patient was seen and examined.      Data Reviewed: I have personally reviewed following labs and imaging studies:  CBC: Recent Labs  Lab 01/20/21 1500 01/21/21 0226  WBC 14.4* 12.0*  NEUTROABS 11.9*  --   HGB 14.2 14.5  HCT 42.1 44.6  MCV 86.4 87.3  PLT 182 154   Basic Metabolic Panel: Recent Labs  Lab 01/20/21 1500 01/21/21 0158  NA 134* 136  K 4.5 4.1  CL 105 105  CO2 21* 18*  GLUCOSE 142* 67*  BUN 15 18  CREATININE 1.12* 1.56*  CALCIUM 8.9 9.1   GFR: Estimated Creatinine Clearance: 30 mL/min (A) (by C-G formula based on SCr of 1.56 mg/dL (H)). Liver Function Tests: Recent Labs  Lab 01/20/21 1500 01/20/21 1850 01/21/21 0158  AST 442*  --  247*  ALT 383*  --   309*  ALKPHOS 187*  --  173*  BILITOT 2.3* 2.6* 3.0*  PROT 7.2  --  6.4*  ALBUMIN 3.3*  --  3.1*   Recent Labs  Lab 01/20/21 1500  LIPASE 22   No results for input(s): AMMONIA in the last 168 hours. Coagulation Profile: Recent Labs  Lab 01/20/21 1500 01/21/21 0226  INR 1.8* 1.6*   Cardiac Enzymes: No results for input(s): CKTOTAL, CKMB, CKMBINDEX, TROPONINI in the last 168 hours. BNP (last 3 results) No results for input(s): PROBNP in the last 8760 hours. HbA1C: Recent Labs    01/21/21 0233  HGBA1C 7.0*   CBG: Recent Labs  Lab 01/21/21 0445 01/21/21 0523 01/21/21 0837 01/21/21 1116  GLUCAP 67* 155* 99 86   Lipid Profile: No results for input(s): CHOL, HDL, LDLCALC, TRIG, CHOLHDL, LDLDIRECT in the last 72 hours. Thyroid Function Tests: No results for input(s): TSH, T4TOTAL, FREET4, T3FREE, THYROIDAB in the last 72 hours. Anemia Panel: No results for input(s): VITAMINB12, FOLATE, FERRITIN, TIBC, IRON, RETICCTPCT in the last 72 hours. Urine analysis:    Component Value Date/Time   COLORURINE YELLOW 01/20/2021 1700   APPEARANCEUR CLEAR 01/20/2021 1700   LABSPEC 1.010 01/20/2021 1700   PHURINE 8.0 01/20/2021 1700   GLUCOSEU NEGATIVE 01/20/2021 1700   HGBUR TRACE (A) 01/20/2021 1700   BILIRUBINUR NEGATIVE 01/20/2021 1700   KETONESUR NEGATIVE 01/20/2021 1700   PROTEINUR NEGATIVE 01/20/2021 1700   UROBILINOGEN 1.0 08/16/2014 0423   NITRITE NEGATIVE 01/20/2021 1700   LEUKOCYTESUR NEGATIVE 01/20/2021 1700   Sepsis Labs: @LABRCNTIP (procalcitonin:4,lacticacidven:4)  ) Recent Results (from the past 240 hour(s))  Resp Panel by RT-PCR (Flu A&B, Covid) Nasopharyngeal Swab     Status: None   Collection Time: 01/20/21  3:00 PM   Specimen: Nasopharyngeal Swab; Nasopharyngeal(NP) swabs in vial transport medium  Result Value Ref Range Status   SARS Coronavirus 2 by RT PCR NEGATIVE NEGATIVE Final    Comment: (NOTE) SARS-CoV-2 target nucleic acids are NOT  DETECTED.  The SARS-CoV-2 RNA is generally detectable in upper respiratory specimens during the acute phase of infection. The lowest concentration of SARS-CoV-2 viral copies this assay can detect is 138 copies/mL. A negative result does not preclude SARS-Cov-2 infection and should not be used as the sole basis for treatment or other patient management decisions. A negative result may occur with  improper specimen collection/handling, submission of specimen other than nasopharyngeal swab, presence of viral mutation(s) within the areas targeted by this assay, and inadequate number of viral copies(<138 copies/mL). A negative result must be combined with clinical observations, patient history, and epidemiological information. The expected result is Negative.  Fact Sheet for Patients:  BloggerCourse.com  Fact Sheet for Healthcare Providers:  SeriousBroker.it  This test is no t yet approved or cleared by the [place] FDA and  has been authorized for detection and/or diagnosis of SARS-CoV-2 by FDA under an Emergency Use Authorization (EUA). This EUA will remain  in effect (meaning this test can be used) for the duration of the COVID-19 declaration under Section 564(b)(1) of the Act, 21 U.S.C.section 360bbb-3(b)(1), unless the authorization is terminated  or revoked sooner.       Influenza A by PCR NEGATIVE NEGATIVE Final   Influenza B by PCR NEGATIVE NEGATIVE Final    Comment: (NOTE) The Xpert Xpress SARS-CoV-2/FLU/RSV plus assay is intended as an aid in the diagnosis of influenza from Nasopharyngeal swab specimens and should not be used as a sole basis for treatment. Nasal washings and aspirates are unacceptable for Xpert Xpress SARS-CoV-2/FLU/RSV testing.  Fact Sheet for Patients: BloggerCourse.com  Fact Sheet for Healthcare Providers: SeriousBroker.it  This test is not yet  approved or cleared by the [place] FDA and has been authorized for detection and/or diagnosis of SARS-CoV-2 by FDA under an Emergency Use Authorization (EUA). This EUA will remain in effect (meaning this test can be used) for the duration of the COVID-19 declaration under Section 564(b)(1) of the Act, 21 U.S.C. section 360bbb-3(b)(1), unless the authorization is terminated or revoked.  Performed at [facility], [address] [place], [place] [address]          Radiology Studies: CT Angio Chest PE W/Cm &/Or Wo Cm  Result Date: 01/20/2021 CLINICAL DATA:  PE suspected, low/intermediate prob, positive D-dimer Vomiting since last night. EXAM: CT ANGIOGRAPHY CHEST WITH CONTRAST TECHNIQUE: Multidetector CT imaging of the chest was performed using the standard protocol during bolus administration of intravenous contrast. Multiplanar CT image reconstructions and MIPs were obtained to evaluate the vascular anatomy. CONTRAST:  OMNIPAQUE IOHEXOL 350 MG/ML SOLN COMPARISON:  Radiograph earlier today.  Chest CTA 11/10/2019 FINDINGS: Cardiovascular: There are no filling defects within the pulmonary arteries to suggest pulmonary embolus. Diffuse aortic atherosclerosis and tortuosity. No aortic aneurysm. Cannot assess for dissection given phase of contrast tailored to pulmonary artery evaluation. There are coronary artery calcifications. Mild cardiomegaly. No pericardial effusion. Mediastinum/Nodes: No enlarged mediastinal or hilar lymph nodes. Decompressed esophagus. Small hiatal hernia. No thyroid nodule. Lungs/Pleura: No acute airspace disease. No pleural effusion. No pulmonary edema. No pulmonary nodule or mass. The trachea and central bronchi are patent. Upper Abdomen: Cyst on concurrent abdominopelvic CT, reported separately. Musculoskeletal: Moderate thoracic scoliosis. 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 There are no acute or suspicious osseous abnormalities.  Review of the MIP images confirms the above findings. IMPRESSION: 1. No pulmonary embolus or acute intrathoracic abnormality. 2. Diffuse aortic atherosclerosis and tortuosity. Coronary artery calcifications. Mild cardiomegaly. Aortic Atherosclerosis (ICD10-I70.0). Electronically Signed   By: Narda Rutherford M.D.   On: 01/20/2021 16:51   CT Abdomen Pelvis W Contrast  Result Date: 01/20/2021 CLINICAL DATA:  Nausea and vomiting. EXAM: CT ABDOMEN AND PELVIS WITH CONTRAST TECHNIQUE: Multidetector CT imaging of the abdomen and pelvis was performed using the standard protocol following bolus administration of intravenous contrast. CONTRAST:  OMNIPAQUE IOHEXOL 350 MG/ML SOLN COMPARISON:  Abdominopelvic CT 11/09/2020 FINDINGS: Lower chest: Assessed on concurrent chest CT, reported separately. Hepatobiliary: Previous subcentimeter low-density in the lateral right liver is not well seen on the current exam. Multiple stones in the gallbladder with gallbladder distension and mild wall thickening/pericholecystic  edema, for example series 7, image 62. There is intra and extrahepatic biliary ductal dilatation with noncalcified filling defects in the common bile duct, for example series 4, image 30 and 33. Common bile duct measures 10 mm. Pancreas: Parenchymal atrophy. No ductal dilatation or inflammation. Spleen: Normal in size without focal abnormality. Adrenals/Urinary Tract: Normal adrenal glands. 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 There is no hydronephrosis or perinephric edema. Symmetric renal excretion on delayed phase imaging. Stable cyst in the lower left kidney. Unremarkable urinary bladder. Stomach/Bowel: Tiny hiatal hernia. Decompressed stomach. No small bowel obstruction or inflammation normal appendix. Multifocal colonic diverticulosis, most prominently affecting the left colon, without diverticulitis no colonic inflammation. Vascular/Lymphatic: Advanced aortic atherosclerosis.  Advanced aortic branch atherosclerosis. 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 Portal vein is patent. No portal venous or mesenteric gas. Infrarenal IVC filter in place. No enlarged lymph nodes in the abdomen or pelvis. Reproductive: Atrophic uterus, normal for age. Quiescent ovaries. No adnexal mass. Other: No ascites, free air or focal fluid collection. Musculoskeletal: No acute osseous abnormalities are seen. 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 Prominent lumbar facet hypertrophy. IMPRESSION: 1. Cholelithiasis with gallbladder distension and mild wall thickening/pericholecystic edema, suspicious for acute cholecystitis. 2. Intra and extrahepatic biliary ductal dilatation with noncalcified filling defects in the common bile duct, suspicious for choledocholithiasis. 3. Colonic diverticulosis without diverticulitis. Aortic Atherosclerosis (ICD10-I70.0). Electronically Signed   By: Narda Rutherford M.D.   On: 01/20/2021 16:57   DG Chest Portable 1 View  Result Date: 01/20/2021 CLINICAL DATA:  Cough shortness of breath, LEFT-sided pain since last night. EXAM: PORTABLE CHEST 1 VIEW COMPARISON:  August 15, 2020. FINDINGS: EKG leads project over the chest. Cardiomediastinal contours show persistent cardiac enlargement and fullness of central pulmonary vasculature. No frank edema. No consolidation. No pleural effusion on frontal radiograph. On limited assessment no acute skeletal process. IMPRESSION: Cardiomegaly and central vascular congestion without frank edema. Similar to prior chest x-ray. Electronically Signed   By: Donzetta Kohut M.D.   On: 01/20/2021 15:16   US Abdomen Limited RUQ (LIVER/GB)  Result Date: 01/20/2021 CLINICAL DATA:  Right upper quadrant pain with vomiting EXAM: ULTRASOUND ABDOMEN LIMITED RIGHT UPPER QUADRANT COMPARISON:  CT 01/20/2021 FINDINGS: Gallbladder: Small stones in the gallbladder. Gallbladder is distended. Slight increased wall thickness up to  6 mm but no sonographic Murphy. Common bile duct: Diameter: 5.6 mm Liver: No focal lesion identified. Within normal limits in parenchymal echogenicity. Portal vein is patent on color Doppler imaging with normal direction of blood flow towards the liver. Other: None. IMPRESSION: 1. Distended gallbladder with stones and slight wall thickening but negative sonographic Murphy. Correlation with nuclear medicine hepatobiliary imaging could be obtained if continued concern for acute cholecystitis Electronically Signed   By: Jasmine Pang M.D.   On: 01/20/2021 18:42        Scheduled Meds:  [START ON 01/22/2021] aspirin EC  81 mg Oral Daily   busPIRone  10 mg Oral BID   gabapentin  300 mg Oral QHS   insulin aspart  0-9 Units Subcutaneous Q4H   predniSONE  30 mg Oral Q breakfast   Continuous Infusions:  sodium chloride 100 mL/hr at 01/21/21 1052     LOS: 1 day    Time spent: 20 minutes    Alberteen Sam, MD [facility] 01/21/2021, 2:09 PM     Please page though AMION or Epic secure chat:  For password, contact charge nurse

## 2021-01-21 NOTE — Consult Note (Addendum)
[facility] Consult: 8:19 AM 01/21/2021  LOS: 1 day    Referring Provider: Dr Loleta Books.  Primary Care Physician:  Burman Freestone, MD at [facility] Primary Gastroenterologist:  Dr. Aurelio Brash    Reason for Consultation:  choledocholthiasis.      HPI: Jill Crane is a 80 y.o. female.  PMH A. fib.  Chronic Coumadin.  DVT/right IJ thrombus 2014..  Htn.  COPD.  Systolic/diastolic heart failure.  DM 2.  Osteoporosis.  Gallstones GB polyps, nondilated biliary tree.  On ultrasound 11/2019.  Fatty liver, right hepatic cyst on ultrasound 2014.  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  H CAP/sepsis 2014 requiring HD for AKI. 03/2010 colonoscopy.  Polyps removed, pathology tubular adenoma.  Procedure report not found 03/2010 EGD.  Pathology of small bowel biopsies benign, no changes of sprue.  Procedure report not found.    At baseline patient has overall reduced but stable appetite, no frequent issues with heartburn, dysphagia, abdominal pain, altered bowel habits, GI bleeding.  Beginning Wednesday evening after eating watermelon and some apple sauce she developed pain in her left (not the right) upper quadrant in the region of the lateral flank area.  This did not radiate.  Associated with nonbloody emesis, chills.  Symptoms subsided within a few hours and she went to sleep.  The next day, yesterday, symptoms recurred and she presented to [facility] ED.  Symptoms have again subsided.  T bili 3.  Alk phos 173.  AST/ALT 247/309.  WBCs 12. INR1.6.     Abdominal ultrasound: Gallbladder stones.  Gallbladder distention.  Slight gallbladder wall thickening.  5.6 mm CBD.  PV flow normal. CTAP with contrast.  Multiples GB stones, mild GB wall thickening.  Intra and extrahepatic bile ducts  dilated.  Filling defects in CBD which measures 10 mm.  Parenchymal atrophy but no pancreatic duct dilation or.  No pancreatic inflammation.  Colonic diverticulosis.  Advanced aortic and aortic branch atherosclerosis.  Chronically occluded left CFA.  PV patent.  IVC filter in place.   CXR with vascular congestion but no frank edema, stable compared with previous 07/2020 CXR.                                                                                          Family history stroke in both parents.  Diabetes in her mother.  Hypertension.  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  She cannot read.  Retired from working in a [employer].  Smoked up until 8 years ago.  Does not consume alcohol, no significant history of alcohol use.  Widowed, lives with her granddaughter in [place].  4 children, 2 of them live in the [place] area, 2 are in [place].    Past Medical History:  Diagnosis Date   Atrial fibrillation with normal ventricular rate (HCC)    CHF (congestive heart failure) (HCC)    COPD (chronic obstructive pulmonary disease) (HCC)    Diabetes mellitus without complication (HCC)    Hx of blood clots    Hypertension     History reviewed. No pertinent surgical history.  Prior to Admission medications   Medication Sig Start Date End Date Taking? Authorizing Provider  albuterol (PROVENTIL HFA;VENTOLIN HFA) 108 (90 BASE) MCG/ACT inhaler Inhale 2 puffs into the lungs every 6 (six) hours as needed for wheezing or shortness of breath.    [provider]  alendronate (FOSAMAX) 35 MG tablet Take 35 mg by mouth every 7 (seven) days. Take with a full glass of water on an empty stomach.    [provider]  aspirin 81 MG chewable tablet Chew 81 mg by mouth daily.    [provider]  bimatoprost (LUMIGAN) 0.01 % SOLN Place 1 drop into both eyes at bedtime. 11/04/19   [provider]  brimonidine (ALPHAGAN) 0.2 % ophthalmic  solution Place 1 drop into the left eye in the morning and at bedtime. 10/21/19   [provider]  busPIRone (BUSPAR) 10 MG tablet Take 10 mg by mouth 2 (two) times daily. 08/04/20   [provider]  calcium-vitamin D (OSCAL WITH D) 500-200 MG-UNIT per tablet Take 1 tablet by mouth.    [provider]  dorzolamide (TRUSOPT) 2 % ophthalmic solution Place 1 drop into both eyes at bedtime. 08/03/20   [provider]  furosemide (LASIX) 20 MG tablet Take 40 mg (2 tablets) tomorrow and the day after, then continue with 20 mg daily Patient taking differently: Take 20 mg by mouth daily. 04/15/17   Harris, Vernie Shanks, PA-C  gabapentin (NEURONTIN) 300 MG capsule Take 300 mg by mouth at bedtime. 08/10/20   [provider]  magnesium oxide (MAG-OX) 400 (241.3 Mg) MG tablet Take 1 tablet by mouth daily. 07/08/20   [provider]  MATZIM LA 240 MG 24 hr tablet Take 240 mg by mouth daily. 08/03/20   [provider]  metoprolol tartrate (LOPRESSOR) 25 MG tablet Take 1 tablet (25 mg total) by mouth 2 (two) times daily. 08/17/20   Nita Sells, MD  omeprazole (PRILOSEC) 20 MG capsule Take 20 mg by mouth daily.    [provider]  predniSONE (DELTASONE) 10 MG tablet Take 0.5 tablets (5 mg total) by mouth daily with breakfast. 08/17/20   Nita Sells, MD  warfarin (COUMADIN) 2 MG tablet Take 2-4 mg by mouth as directed. Take 1 tablet (2 mg) daily except Take 2 tablets (4 mg) on (Mon & Fri) 06/02/20   [provider]    Scheduled Meds:  busPIRone  10 mg Oral BID   gabapentin  300 mg Oral QHS   insulin aspart  0-9 Units Subcutaneous Q4H   predniSONE  30 mg Oral Q breakfast   Infusions:  sodium chloride     piperacillin-tazobactam (ZOSYN)  IV 3.375 g (01/21/21 0431)   PRN Meds: acetaminophen **OR** acetaminophen, metoprolol tartrate, morphine injection, ondansetron **OR** ondansetron (ZOFRAN) IV   Allergies as of 01/20/2021    (No Known Allergies)    Family History  Problem Relation Age of Onset   Stroke Mother    Diabetes Mother    Stroke Father  Social History   Socioeconomic History   Marital status: Widowed    Spouse name: Not on file   Number of children: Not on file   Years of education: Not on file   Highest education level: Not on file  Occupational History   Not on file  Tobacco Use   Smoking status: Former    Pack years: 0.00   Smokeless tobacco: Never  Substance and Sexual Activity   Alcohol use: No   Drug use: Not on file   Sexual activity: Not on file  Other Topics Concern   Not on file  Social History Narrative   Not on file   Social Determinants of Health   Financial Resource Strain: Not on file  Food Insecurity: Not on file  Transportation Needs: Not on file  Physical Activity: Not on file  Stress: Not on file  Social Connections: Not on file  Intimate Partner Violence: Not on file    REVIEW OF SYSTEMS: Constitutional: Generally has fair energy level.  Not currently complaining of new fatigue or weakness. ENT:  No nose bleeds Pulm: Breathing stable.  Occasional nonproductive cough. CV:  No palpitations.  Occasional but not current LE edema.  No angina. GU:  No hematuria, no frequency GI: See HPI. Heme: No recall of previous diagnosis with anemia or iron/B12/folate supplements.  No unusual or excessive bleeding or bruising but does develop small bruises easily. Transfusions: No recall of previous blood transfusions. Neuro:  No headaches, no seizures, no syncope.  Some neuropathy with tingling and numbness in her feet. Derm:  No itching, no rash or sores.  Endocrine:  No sweats or chills.  No polyuria or dysuria Immunization: Has been vaccinated for COVID-19. Travel:  None beyond local counties in last few months.    PHYSICAL EXAM: Vital signs in last 24 hours: Vitals:   01/21/21 0234 01/21/21 0439  BP: 97/68 (!) 96/58  Pulse: 93 90  Resp: 18 16  Temp:  97.6 F (36.4 C) 97.7 F (36.5 C)  SpO2: 95% 96%   Wt Readings from Last 3 Encounters:  01/20/21 76.7 kg  12/30/20 76.7 kg  08/15/20 77.1 kg    General: Pleasant, elderly, comfortable.  Speech is a bit garbled and difficult to understand. Head: No facial asymmetry or swelling.  Pemphigus skin changes at the top of scalp. Eyes: No conjunctival pallor or scleral icterus.  EOMI. Ears: Not hard of hearing. Nose: No congestion or discharge Mouth: Edentulous with upper dentures in place.  Mucosa is moist, pink, clear.  Tongue is midline. Neck: JVD, no masses, no thyromegaly Lungs: Occasional dry cough.  No shortness of breath.  Lungs are clear bilaterally though overall breath sounds are reduced. Heart: Irregularly irregular, rate in 90s.  No MRG. Abdomen: Soft.  Minimally tender (no antalgic reaction but did say it was a bit sore) on palpation of left upper quadrant.  Bowel sounds active.  Not distended.  No HSM, no bruits, no masses..   Rectal: Deferred Musc/Skeltl: No obvious joint deformities. Extremities: No CCE.  Do not appreciate dorsalis pedal pulses but feet are warm to the touch. Neurologic: Alert.  Appropriate.  Oriented to place, person.  Had a little bit of difficulty remembering what day it was but was in the general ballpark of Friday.  Moves all 4 limbs without tremor or gross deficits, strength not tested. Skin: Changes of pemphigus on her scalp. Nodes: No cervical adenopathy Psych: Pleasant, cooperative, calm.  Intake/Output from previous day: 07/07 0701 -  07/08 0700 In: 578.1 [IV Piggyback:578.1] Out: -  Intake/Output this shift: No intake/output data recorded.  LAB RESULTS: Recent Labs    01/20/21 1500 01/21/21 0226  WBC 14.4* 12.0*  HGB 14.2 14.5  HCT 42.1 44.6  PLT 182 154   BMET Lab Results  Component Value Date   NA 136 01/21/2021   NA 134 (L) 01/20/2021   NA 138 08/16/2020   K 4.1 01/21/2021   K 4.5 01/20/2021   K 4.2 08/16/2020   CL 105  01/21/2021   CL 105 01/20/2021   CL 103 08/16/2020   CO2 18 (L) 01/21/2021   CO2 21 (L) 01/20/2021   CO2 25 08/16/2020   GLUCOSE 67 (L) 01/21/2021   GLUCOSE 142 (H) 01/20/2021   GLUCOSE 116 (H) 08/16/2020   BUN 18 01/21/2021   BUN 15 01/20/2021   BUN 14 08/16/2020   CREATININE 1.56 (H) 01/21/2021   CREATININE 1.12 (H) 01/20/2021   CREATININE 0.92 08/16/2020   CALCIUM 9.1 01/21/2021   CALCIUM 8.9 01/20/2021   CALCIUM 8.9 08/16/2020   LFT Recent Labs    01/20/21 1500 01/20/21 1850 01/21/21 0158  PROT 7.2  --  6.4*  ALBUMIN 3.3*  --  3.1*  AST 442*  --  247*  ALT 383*  --  309*  ALKPHOS 187*  --  173*  BILITOT 2.3* 2.6* 3.0*  BILIDIR  --  1.0*  --   IBILI  --  1.6*  --    PT/INR Lab Results  Component Value Date   INR 1.6 (H) 01/21/2021   INR 1.8 (H) 01/20/2021   INR 2.0 (H) 08/17/2020   Hepatitis Panel No results for input(s): HEPBSAG, HCVAB, HEPAIGM, HEPBIGM in the last 72 hours. C-Diff No components found for: CDIFF Lipase     Component Value Date/Time   LIPASE 22 01/20/2021 1500    Drugs of Abuse  No results found for: LABOPIA, COCAINSCRNUR, LABBENZ, AMPHETMU, THCU, LABBARB   RADIOLOGY STUDIES: CT Angio Chest PE W/Cm &/Or Wo Cm  Result Date: 01/20/2021 CLINICAL DATA:  PE suspected, low/intermediate prob, positive D-dimer Vomiting since last night. EXAM: CT ANGIOGRAPHY CHEST WITH CONTRAST TECHNIQUE: Multidetector CT imaging of the chest was performed using the standard protocol during bolus administration of intravenous contrast. Multiplanar CT image reconstructions and MIPs were obtained to evaluate the vascular anatomy. CONTRAST:  134m OMNIPAQUE IOHEXOL 350 MG/ML SOLN COMPARISON:  Radiograph earlier today.  Chest CTA 11/10/2019 FINDINGS: Cardiovascular: There are no filling defects within the pulmonary arteries to suggest pulmonary embolus. Diffuse aortic atherosclerosis and tortuosity. No aortic aneurysm. Cannot assess for dissection given phase of contrast  tailored to pulmonary artery evaluation. There are coronary artery calcifications. Mild cardiomegaly. No pericardial effusion. Mediastinum/Nodes: No enlarged mediastinal or hilar lymph nodes. Decompressed esophagus. Small hiatal hernia. No thyroid nodule. Lungs/Pleura: No acute airspace disease. No pleural effusion. No pulmonary edema. No pulmonary nodule or mass. The trachea and central bronchi are patent. Upper Abdomen: Cyst on concurrent abdominopelvic CT, reported separately. Musculoskeletal: Moderate thoracic scoliosis. 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 There are no acute or suspicious osseous abnormalities. Review of the MIP images confirms the above findings. IMPRESSION: 1. No pulmonary embolus or acute intrathoracic abnormality. 2. Diffuse aortic atherosclerosis and tortuosity. Coronary artery calcifications. Mild cardiomegaly. Aortic Atherosclerosis (ICD10-I70.0). Electronically Signed   By: MKeith RakeM.D.   On: 01/20/2021 16:51   CT Abdomen Pelvis W Contrast  Result Date: 01/20/2021 CLINICAL DATA:  Nausea and vomiting. EXAM: CT ABDOMEN AND  PELVIS WITH CONTRAST TECHNIQUE: Multidetector CT imaging of the abdomen and pelvis was performed using the standard protocol following bolus administration of intravenous contrast. CONTRAST:  163m OMNIPAQUE IOHEXOL 350 MG/ML SOLN COMPARISON:  Abdominopelvic CT 11/09/2020 FINDINGS: Lower chest: Assessed on concurrent chest CT, reported separately. Hepatobiliary: Previous subcentimeter low-density in the lateral right liver is not well seen on the current exam. Multiple stones in the gallbladder with gallbladder distension and mild wall thickening/pericholecystic edema, for example series 7, image 62. There is intra and extrahepatic biliary ductal dilatation with noncalcified filling defects in the common bile duct, for example series 4, image 30 and 33. Common bile duct measures 10 mm. Pancreas: Parenchymal atrophy. No ductal dilatation or  inflammation. Spleen: Normal in size without focal abnormality. Adrenals/Urinary Tract: Normal adrenal glands. 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 There is no hydronephrosis or perinephric edema. Symmetric renal excretion on delayed phase imaging. Stable cyst in the lower left kidney. Unremarkable urinary bladder. Stomach/Bowel: Tiny hiatal hernia. Decompressed stomach. No small bowel obstruction or inflammation normal appendix. Multifocal colonic diverticulosis, most prominently affecting the left colon, without diverticulitis no colonic inflammation. Vascular/Lymphatic: Advanced aortic atherosclerosis. Advanced aortic branch atherosclerosis. 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 Portal vein is patent. No portal venous or mesenteric gas. Infrarenal IVC filter in place. No enlarged lymph nodes in the abdomen or pelvis. Reproductive: Atrophic uterus, normal for age. Quiescent ovaries. No adnexal mass. Other: No ascites, free air or focal fluid collection. Musculoskeletal: No acute osseous abnormalities are seen. 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 Prominent lumbar facet hypertrophy. IMPRESSION: 1. Cholelithiasis with gallbladder distension and mild wall thickening/pericholecystic edema, suspicious for acute cholecystitis. 2. Intra and extrahepatic biliary ductal dilatation with noncalcified filling defects in the common bile duct, suspicious for choledocholithiasis. 3. Colonic diverticulosis without diverticulitis. Aortic Atherosclerosis (ICD10-I70.0). Electronically Signed   By: MKeith RakeM.D.   On: 01/20/2021 16:57   DG Chest Portable 1 View  Result Date: 01/20/2021 CLINICAL DATA:  Cough shortness of breath, LEFT-sided pain since last night. EXAM: PORTABLE CHEST 1 VIEW COMPARISON:  August 15, 2020. FINDINGS: EKG leads project over the chest. Cardiomediastinal contours show persistent cardiac enlargement and fullness of central  pulmonary vasculature. No frank edema. No consolidation. No pleural effusion on frontal radiograph. On limited assessment no acute skeletal process. IMPRESSION: Cardiomegaly and central vascular congestion without frank edema. Similar to prior chest x-ray. Electronically Signed   By: GZetta BillsM.D.   On: 01/20/2021 15:16   UKoreaAbdomen Limited RUQ (LIVER/GB)  Result Date: 01/20/2021 CLINICAL DATA:  Right upper quadrant pain with vomiting EXAM: ULTRASOUND ABDOMEN LIMITED RIGHT UPPER QUADRANT COMPARISON:  CT 01/20/2021 FINDINGS: Gallbladder: Small stones in the gallbladder. Gallbladder is distended. Slight increased wall thickness up to 6 mm but no sonographic Murphy. Common bile duct: Diameter: 5.6 mm Liver: No focal lesion identified. Within normal limits in parenchymal echogenicity. Portal vein is patent on color Doppler imaging with normal direction of blood flow towards the liver. Other: None. IMPRESSION: 1. Distended gallbladder with stones and slight wall thickening but negative sonographic Murphy. Correlation with nuclear medicine hepatobiliary imaging could be obtained if continued concern for acute cholecystitis Electronically Signed   By: KDonavan FoilM.D.   On: 01/20/2021 18:42     IMPRESSION:   Choledocholithiasis.  Cholelithiasis, possible cholecystitis.  General surgery evaluation pending.  Chronic Coumadin.  Hx DVT, status post IVC filter.  Atrial fibrillation.  Last Coumadin was 7/6, now on hold.  INR 1.6.  Pemphigus vulgaris of the scalp.  Treated with chronic steroids.  Now on stress dose steroids of 30 mg daily, baseline is 10 mg daily.  Elevated BNP to 1059, no clinical acute heart failure and CXR does not confirm any acute failure.  Troponins elevated at 74..  55.  No acute ischemia on EKG, no chest pain.  COPD.  Quit smoking about 8 years ago.  Peripheral vascular disease.  Tubular adenomatous polyp on colonoscopy 2011   PLAN:      ERCP tomorrow.  Continue Zosyn.   Added clear liquids.  Continue to hold Coumadin.    Jill Crane  01/21/2021, 8:19 AM Phone [phone]  GI ATTENDING  History, laboratories, x-rays reviewed.  Patient personally seen and examined.  Agree with comprehensive consultation note as outlined above.  Patient presents with symptomatic cholelithiasis and probable choledocholithiasis.  Multiple significant medical problems including history of DVT and atrial fibrillation for which she is on Coumadin.  Elevated BNP noted.  Plans for ERCP with sphincterotomy and stone extraction tomorrow.  The patient is HIGH RISK given her age and comorbidities as well as her chronic anticoagulation issues.The nature of the procedure, as well as the risks, benefits, and alternatives were carefully and thoroughly reviewed with the patient. Ample time for discussion and questions allowed. The patient understood, was satisfied, and agreed to proceed.  We will check PT/INR in a.m.  Plans for ERCP have been made for tomorrow at noon.  Jill Crane. Geri Seminole., M.D. [facility] Division of Gastroenterology

## 2021-01-21 NOTE — Progress Notes (Signed)
Pharmacy Antibiotic Note  Jill Crane is a 79 y.o. female admitted on 01/20/2021 with  intra-abd infection .  Pharmacy has been consulted for Zosyn dosing.  Plan for ERCP tomorrow. Abx was ordered to narrow to Unasyn. D/w Dr Maryfrances Bunnell and we will do ceftriaxone/flagyl instead to avoid resistance.   Plan: Dc zosyn Ceftriaxone 2g IV q24 Flagyl 500mg  IV q12  Height: 5\' 5"  (165.1 cm) Weight: 76.7 kg (169 lb 1.5 oz) IBW/kg (Calculated) : 57  Temp (24hrs), Avg:98.6 F (37 C), Min:97.5 F (36.4 C), Max:102.1 F (38.9 C)  Recent Labs  Lab 01/20/21 1500 01/21/21 0158 01/21/21 0226  WBC 14.4*  --  12.0*  CREATININE 1.12* 1.56*  --      Estimated Creatinine Clearance: 30 mL/min (A) (by C-G formula based on SCr of 1.56 mg/dL (H)).    No Known Allergies  Antimicrobials this admission: 7/7 Zosyn >>7/8 7/8 ceftriaxone>> 7/8 flagyl>>  Microbiology results: 7/7 UCx:     9/8, PharmD, BCIDP, AAHIVP, CPP Infectious Disease Pharmacist 01/21/2021 2:16 PM

## 2021-01-21 NOTE — Progress Notes (Signed)
Pharmacy Antibiotic Note  Jill Crane is a 80 y.o. female admitted on 01/20/2021 with  intra-abd infection .  Pharmacy has been consulted for Zosyn dosing.  Plan: Zosyn 3.375gm IV q8h Will f/u renal function, micro data, and pt's clinical condition  Height: 5\' 5"  (165.1 cm) Weight: 76.7 kg (169 lb 1.5 oz) IBW/kg (Calculated) : 57  Temp (24hrs), Avg:99.6 F (37.6 C), Min:98.3 F (36.8 C), Max:102.1 F (38.9 C)  Recent Labs  Lab 01/20/21 1500  WBC 14.4*  CREATININE 1.12*    Estimated Creatinine Clearance: 41.7 mL/min (A) (by C-G formula based on SCr of 1.12 mg/dL (H)).    No Known Allergies  Antimicrobials this admission: 7/7 Zosyn >>   Microbiology results: 7/7 UCx:     Thank you for allowing pharmacy to be a part of this patient's care.  9/7, PharmD, BCPS Please see amion for complete clinical pharmacist phone list 01/21/2021 2:05 AM

## 2021-01-21 NOTE — H&P (Addendum)
History and Physical    Jill AusSarah J Busbin GNF:[id number] DOB: 01-01-1941 DOA: 01/20/2021  PCP: Zoila ShutterWoodyear, Wynne E, MD  Patient coming from: Home  I have personally briefly reviewed patient[facility]  Chief Complaint: Emesis  HPI: Jill Crane is a 80 y.o. female with medical history significant of A.Fib, DM2, COPD, prior DVT.  Pt on coumadin.  Pt presents to ED with c/o multiple episodes of NBNB emesis, onset last evening.  None this afternoon.  No melena, no BRBPR.  Pt has R lower CP / RUQ abd pressure.  No subjective fevers, chills, hemotpysis, LE Edema.  Symptoms intermittent, moderate, nothing makes better or worse.   ED Course: Pt with AST/ALT 442/383, T bili 2.3.  Tm 102.x.  WBC 14.4k.  CT reveals findings suspicious for cholecystitis and findings suspicious for choledocholithiasis.  Trops 74 and 55  BNP 1059 but no pulm edema on CT scan.  ED gave 500cc NS bolus initially, then later gave 40mg  IV lasix.   Review of Systems: As per HPI, otherwise all review of systems negative.  Past Medical History:  Diagnosis Date   Atrial fibrillation with normal ventricular rate (HCC)    CHF (congestive heart failure) (HCC)    COPD (chronic obstructive pulmonary disease) (HCC)    Diabetes mellitus without complication (HCC)    Hx of blood clots    Hypertension     History reviewed. No pertinent surgical history.   reports that she has quit smoking. She has never used smokeless tobacco. She reports that she does not drink alcohol. No history on file for drug use.  No Known Allergies  Family History  Problem Relation Age of Onset   Stroke Mother    Diabetes Mother    Stroke Father      Prior to Admission medications   Medication Sig Start Date End Date Taking? Authorizing Provider  albuterol (PROVENTIL HFA;VENTOLIN HFA) 108 (90 BASE) MCG/ACT inhaler Inhale 2 puffs into the lungs every 6 (six) hours as needed for wheezing or shortness of  breath.    [provider]  alendronate (FOSAMAX) 35 MG tablet Take 35 mg by mouth every 7 (seven) days. Take with a full glass of water on an empty stomach.    [provider]  aspirin 81 MG chewable tablet Chew 81 mg by mouth daily.    [provider]  bimatoprost (LUMIGAN) 0.01 % SOLN Place 1 drop into both eyes at bedtime. 11/04/19   [provider]  brimonidine (ALPHAGAN) 0.2 % ophthalmic solution Place 1 drop into the left eye in the morning and at bedtime. 10/21/19   [provider]  busPIRone (BUSPAR) 10 MG tablet Take 10 mg by mouth 2 (two) times daily. 08/04/20   [provider]  calcium-vitamin D (OSCAL WITH D) 500-200 MG-UNIT per tablet Take 1 tablet by mouth.    [provider]  dorzolamide (TRUSOPT) 2 % ophthalmic solution Place 1 drop into both eyes at bedtime. 08/03/20   [provider]  furosemide (LASIX) 20 MG tablet Take 40 mg (2 tablets) tomorrow and the day after, then continue with 20 mg daily Patient taking differently: Take 20 mg by mouth daily. 04/15/17   Harris, Cammy CopaAbigail, PA-C  gabapentin (NEURONTIN) 300 MG capsule Take 300 mg by mouth at bedtime. 08/10/20   [provider]  magnesium oxide (MAG-OX) 400 (241.3 Mg) MG tablet Take 1 tablet by mouth daily. 07/08/20   [provider]  Merlyn AlbertMATZIM  LA 240 MG 24 hr tablet Take 240 mg by mouth daily. 08/03/20   [provider]  metoprolol tartrate (LOPRESSOR) 25 MG tablet Take 1 tablet (25 mg total) by mouth 2 (two) times daily. 08/17/20   Rhetta Mura, MD  omeprazole (PRILOSEC) 20 MG capsule Take 20 mg by mouth daily.    [provider]  predniSONE (DELTASONE) 10 MG tablet Take 0.5 tablets (5 mg total) by mouth daily with breakfast. 08/17/20   Rhetta Mura, MD  warfarin (COUMADIN) 2 MG tablet Take 2-4 mg by mouth as directed. Take 1 tablet (2 mg) daily except Take 2 tablets (4 mg) on (Mon & Fri) 06/02/20   [provider]    Physical Exam: Vitals:   01/20/21 1945 01/20/21 2000 01/20/21 2100 01/20/21 2338  BP:  131/86 117/79 99/63  Pulse: (!) 116 85 80 78  Resp:      Temp:   (!) 102.1 F (38.9 C) 98.3 F (36.8 C)  TempSrc:   Oral   SpO2: 100% 92% 95% 96%  Weight:      Height:        Constitutional: NAD, calm, comfortable Eyes: PERRL, lids and conjunctivae normal ENMT: Mucous membranes are moist. Posterior pharynx clear of any exudate or lesions.Normal dentition.  Neck: normal, supple, no masses, no thyromegaly Respiratory: clear to auscultation bilaterally, no wheezing, no crackles. Normal respiratory effort. No accessory muscle use.  Cardiovascular: Regular rate and rhythm, no murmurs / rubs / gallops. No extremity edema. 2+ pedal pulses. No carotid bruits.  Abdomen: no tenderness, no masses palpated. No hepatosplenomegaly. Bowel sounds positive.  Musculoskeletal: no clubbing / cyanosis. No joint deformity upper and lower extremities. Good ROM, no contractures. Normal muscle tone.  Skin: no rashes, lesions, ulcers. No induration Neurologic: CN 2-12 grossly intact. Sensation intact, DTR normal. Strength 5/5 in all 4.  Psychiatric: Normal judgment and insight. Alert and oriented x 3. Normal mood.    Labs on Admission: I have personally reviewed following labs and imaging studies  CBC: Recent Labs  Lab 01/20/21 1500  WBC 14.4*  NEUTROABS 11.9*  HGB 14.2  HCT 42.1  MCV 86.4  PLT 182   Basic Metabolic Panel: Recent Labs  Lab 01/20/21 1500  NA 134*  K 4.5  CL 105  CO2 21*  GLUCOSE 142*  BUN 15  CREATININE 1.12*  CALCIUM 8.9   GFR: Estimated Creatinine Clearance: 41.7 mL/min (A) (by C-G formula based on SCr of 1.12 mg/dL (H)). Liver Function Tests: Recent Labs  Lab 01/20/21 1500 01/20/21 1850  AST 442*  --   ALT 383*  --   ALKPHOS 187*  --   BILITOT 2.3* 2.6*  PROT 7.2  --   ALBUMIN 3.3*  --    Recent Labs  Lab 01/20/21 1500  LIPASE 22   No results for input(s):  AMMONIA in the last 168 hours. Coagulation Profile: Recent Labs  Lab 01/20/21 1500  INR 1.8*   Cardiac Enzymes: No results for input(s): CKTOTAL, CKMB, CKMBINDEX, TROPONINI in the last 168 hours. BNP (last 3 results) No results for input(s): PROBNP in the last 8760 hours. HbA1C: No results for input(s): HGBA1C in the last 72 hours. CBG: No results for input(s): GLUCAP in the last 168 hours. Lipid Profile: No results for input(s): CHOL, HDL, LDLCALC, TRIG, CHOLHDL, LDLDIRECT in the last 72 hours. Thyroid Function Tests: No results for input(s): TSH, T4TOTAL, FREET4, T3FREE, THYROIDAB in the last 72 hours. Anemia Panel: No results for input(s): VITAMINB12,  FOLATE, FERRITIN, TIBC, IRON, RETICCTPCT in the last 72 hours. Urine analysis:    Component Value Date/Time   COLORURINE YELLOW 01/20/2021 1700   APPEARANCEUR CLEAR 01/20/2021 1700   LABSPEC 1.010 01/20/2021 1700   PHURINE 8.0 01/20/2021 1700   GLUCOSEU NEGATIVE 01/20/2021 1700   HGBUR TRACE (A) 01/20/2021 1700   BILIRUBINUR NEGATIVE 01/20/2021 1700   KETONESUR NEGATIVE 01/20/2021 1700   PROTEINUR NEGATIVE 01/20/2021 1700   UROBILINOGEN 1.0 08/16/2014 0423   NITRITE NEGATIVE 01/20/2021 1700   LEUKOCYTESUR NEGATIVE 01/20/2021 1700    Radiological Exams on Admission: CT Angio Chest PE W/Cm &/Or Wo Cm  Result Date: 01/20/2021 CLINICAL DATA:  PE suspected, low/intermediate prob, positive D-dimer Vomiting since last night. EXAM: CT ANGIOGRAPHY CHEST WITH CONTRAST TECHNIQUE: Multidetector CT imaging of the chest was performed using the standard protocol during bolus administration of intravenous contrast. Multiplanar CT image reconstructions and MIPs were obtained to evaluate the vascular anatomy. CONTRAST:  OMNIPAQUE IOHEXOL 350 MG/ML SOLN COMPARISON:  Radiograph earlier today.  Chest CTA 11/10/2019 FINDINGS: Cardiovascular: There are no filling defects within the pulmonary arteries to suggest pulmonary embolus. Diffuse  aortic atherosclerosis and tortuosity. No aortic aneurysm. Cannot assess for dissection given phase of contrast tailored to pulmonary artery evaluation. There are coronary artery calcifications. Mild cardiomegaly. No pericardial effusion. Mediastinum/Nodes: No enlarged mediastinal or hilar lymph nodes. Decompressed esophagus. Small hiatal hernia. No thyroid nodule. Lungs/Pleura: No acute airspace disease. No pleural effusion. No pulmonary edema. No pulmonary nodule or mass. The trachea and central bronchi are patent. Upper Abdomen: Cyst on concurrent abdominopelvic CT, reported separately. Musculoskeletal: Moderate thoracic scoliosis. 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 There are no acute or suspicious osseous abnormalities. Review of the MIP images confirms the above findings. IMPRESSION: 1. No pulmonary embolus or acute intrathoracic abnormality. 2. Diffuse aortic atherosclerosis and tortuosity. Coronary artery calcifications. Mild cardiomegaly. Aortic Atherosclerosis (ICD10-I70.0). Electronically Signed   By: Narda Rutherford M.D.   On: 01/20/2021 16:51   CT Abdomen Pelvis W Contrast  Result Date: 01/20/2021 CLINICAL DATA:  Nausea and vomiting. EXAM: CT ABDOMEN AND PELVIS WITH CONTRAST TECHNIQUE: Multidetector CT imaging of the abdomen and pelvis was performed using the standard protocol following bolus administration of intravenous contrast. CONTRAST:  OMNIPAQUE IOHEXOL 350 MG/ML SOLN COMPARISON:  Abdominopelvic CT 11/09/2020 FINDINGS: Lower chest: Assessed on concurrent chest CT, reported separately. Hepatobiliary: Previous subcentimeter low-density in the lateral right liver is not well seen on the current exam. Multiple stones in the gallbladder with gallbladder distension and mild wall thickening/pericholecystic edema, for example series 7, image 62. There is intra and extrahepatic biliary ductal dilatation with noncalcified filling defects in the common bile duct, for example  series 4, image 30 and 33. Common bile duct measures 10 mm. Pancreas: Parenchymal atrophy. No ductal dilatation or inflammation. Spleen: Normal in size without focal abnormality. Adrenals/Urinary Tract: Normal adrenal glands. 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 There is no hydronephrosis or perinephric edema. Symmetric renal excretion on delayed phase imaging. Stable cyst in the lower left kidney. Unremarkable urinary bladder. Stomach/Bowel: Tiny hiatal hernia. Decompressed stomach. No small bowel obstruction or inflammation normal appendix. Multifocal colonic diverticulosis, most prominently affecting the left colon, without diverticulitis no colonic inflammation. Vascular/Lymphatic: Advanced aortic atherosclerosis. Advanced aortic branch atherosclerosis. 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 Portal vein is patent. No portal venous or mesenteric gas. Infrarenal IVC filter in place. No enlarged lymph nodes in the abdomen or pelvis. Reproductive: Atrophic uterus, normal for  age. Quiescent ovaries. No adnexal mass. Other: No ascites, free air or focal fluid collection. Musculoskeletal: No acute osseous abnormalities are seen. 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 Prominent lumbar facet hypertrophy. IMPRESSION: 1. Cholelithiasis with gallbladder distension and mild wall thickening/pericholecystic edema, suspicious for acute cholecystitis. 2. Intra and extrahepatic biliary ductal dilatation with noncalcified filling defects in the common bile duct, suspicious for choledocholithiasis. 3. Colonic diverticulosis without diverticulitis. Aortic Atherosclerosis (ICD10-I70.0). Electronically Signed   By: Narda Rutherford M.D.   On: 01/20/2021 16:57   DG Chest Portable 1 View  Result Date: 01/20/2021 CLINICAL DATA:  Cough shortness of breath, LEFT-sided pain since last night. EXAM: PORTABLE CHEST 1 VIEW COMPARISON:  August 15, 2020. FINDINGS: EKG leads  project over the chest. Cardiomediastinal contours show persistent cardiac enlargement and fullness of central pulmonary vasculature. No frank edema. No consolidation. No pleural effusion on frontal radiograph. On limited assessment no acute skeletal process. IMPRESSION: Cardiomegaly and central vascular congestion without frank edema. Similar to prior chest x-ray. Electronically Signed   By: Donzetta Kohut M.D.   On: 01/20/2021 15:16   US Abdomen Limited RUQ (LIVER/GB)  Result Date: 01/20/2021 CLINICAL DATA:  Right upper quadrant pain with vomiting EXAM: ULTRASOUND ABDOMEN LIMITED RIGHT UPPER QUADRANT COMPARISON:  CT 01/20/2021 FINDINGS: Gallbladder: Small stones in the gallbladder. Gallbladder is distended. Slight increased wall thickness up to 6 mm but no sonographic Murphy. Common bile duct: Diameter: 5.6 mm Liver: No focal lesion identified. Within normal limits in parenchymal echogenicity. Portal vein is patent on color Doppler imaging with normal direction of blood flow towards the liver. Other: None. IMPRESSION: 1. Distended gallbladder with stones and slight wall thickening but negative sonographic Murphy. Correlation with nuclear medicine hepatobiliary imaging could be obtained if continued concern for acute cholecystitis Electronically Signed   By: Jasmine Pang M.D.   On: 01/20/2021 18:42    EKG: Independently reviewed.  Assessment/Plan Principal Problem:   Cholecystitis Active Problems:   A-fib (HCC)   Diabetes (HCC)   Essential hypertension   Current chronic use of systemic steroids    Cholecystitis - ? Choledocholithiasis with transaminitis ? Early ascending cholangitis with the 102.x fever and tachycardia at [facility] Empiric zosyn for now NPO, hold coumadin Morphine PRN pain Zofran PRN nausea GI and gen surg to see in AM. A.Fib - Holding coumadin, repeat INR in AM Due to risk of development of sepsis (doesn't have sepsis just yet), holding long acting metoprolol and cardizem  LA Use short acting lopressor IV PRN if needed for RVR in stead DM2 - Sensitive SSI Q4H while NPO Chronic steroid use (for Pemphigus Vulgaris) - Pt confirms taking 4 pills a day (looks like 4x 2.5mg  per derm note for  / day) Will triple up on chronic prednisone (sick dose) steroids Dont think she needs stress dose at this point. HTN - Holding BP meds BNP elevation - No acute CHF findings clinically today Holding off on further diuretics or fluids for the moment Trop elevation - Doubt ACS Pt on tele monitor Down trending  DVT prophylaxis: SCDs Code Status: Full Family Communication: No family in room Disposition Plan: Home after treatment for cholecystitis Consults called: EDP spoke with Dr. Magnus Ivan and Dr. Bosie Clos Admission status: Admit to inpatient  Severity of Illness: The appropriate patient status for this patient is INPATIENT. Inpatient status is judged to be reasonable and necessary in order to provide the required intensity of service to ensure the patient's safety. The patient's presenting symptoms, physical exam  findings, and initial radiographic and laboratory data in the context of their chronic comorbidities is felt to place them at high risk for further clinical deterioration. Furthermore, it is not anticipated that the patient will be medically stable for discharge from the hospital within 2 midnights of admission. The following factors support the patient status of inpatient.   IP status for acute cholecystitis, possible choledocholithiasis.   * I certify that at the point of admission it is my clinical judgment that the patient will require inpatient hospital care spanning beyond 2 midnights from the point of admission due to high intensity of service, high risk for further deterioration and high frequency of surveillance required.*   Aishi Courts M. DO [facility]  How to contact the [facility] Attending or Consulting provider 7A - 7P or covering  provider during after hours 7P -7A, for this patient?  Check the care team in [facility] and look for a) attending/consulting [facility] provider listed and b) the [facility] team listed Log into [email or web address]  Am[employer] Physician Scheduling and messaging for groups and whole hospitals  On call and physician scheduling software for group practices, residents, hospitalists and other medical providers for call, clinic, rotation and shift schedules. On[employer] is a hospital-wide system for scheduling doctors and paging doctors on call. EasyPlot is for scientific plotting and data analysis.  [email or web address]  and use [place]'s universal password to access. If you do not have the password, please contact the hospital operator.  Locate the [facility] provider you are looking for under [facility] and page to a number that you can be directly reached. If you still have difficulty reaching the provider, please page the [facility] (Director on Call) for the Hospitalists listed on amion for assistance.  01/21/2021, 2:26 AM

## 2021-01-21 NOTE — H&P (View-Only) (Signed)
[facility] Consult: 8:19 AM 01/21/2021  LOS: 1 day    Referring Provider: Dr Loleta Books.  Primary Care Physician:  Burman Freestone, MD at [facility] Primary Gastroenterologist:  Dr. Aurelio Brash    Reason for Consultation:  choledocholthiasis.      HPI: Jill Crane is a 80 y.o. female.  PMH A. fib.  Chronic Coumadin.  DVT/right IJ thrombus 2014..  Htn.  COPD.  Systolic/diastolic heart failure.  DM 2.  Osteoporosis.  Gallstones GB polyps, nondilated biliary tree.  On ultrasound 11/2019.  Fatty liver, right hepatic cyst on ultrasound 2014.  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  H CAP/sepsis 2014 requiring HD for AKI. 03/2010 colonoscopy.  Polyps removed, pathology tubular adenoma.  Procedure report not found 03/2010 EGD.  Pathology of small bowel biopsies benign, no changes of sprue.  Procedure report not found.    At baseline patient has overall reduced but stable appetite, no frequent issues with heartburn, dysphagia, abdominal pain, altered bowel habits, GI bleeding.  Beginning Wednesday evening after eating watermelon and some apple sauce she developed pain in her left (not the right) upper quadrant in the region of the lateral flank area.  This did not radiate.  Associated with nonbloody emesis, chills.  Symptoms subsided within a few hours and she went to sleep.  The next day, yesterday, symptoms recurred and she presented to [facility] ED.  Symptoms have again subsided.  T bili 3.  Alk phos 173.  AST/ALT 247/309.  WBCs 12. INR1.6.     Abdominal ultrasound: Gallbladder stones.  Gallbladder distention.  Slight gallbladder wall thickening.  5.6 mm CBD.  PV flow normal. CTAP with contrast.  Multiples GB stones, mild GB wall thickening.  Intra and extrahepatic bile ducts  dilated.  Filling defects in CBD which measures 10 mm.  Parenchymal atrophy but no pancreatic duct dilation or.  No pancreatic inflammation.  Colonic diverticulosis.  Advanced aortic and aortic branch atherosclerosis.  Chronically occluded left CFA.  PV patent.  IVC filter in place.   CXR with vascular congestion but no frank edema, stable compared with previous 07/2020 CXR.                                                                                          Family history stroke in both parents.  Diabetes in her mother.  Hypertension.  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  She cannot read.  Retired from working in a [employer].  Smoked up until 8 years ago.  Does not consume alcohol, no significant history of alcohol use.  Widowed, lives with her granddaughter in [place].  4 children, 2 of them live in the [place] area, 2 are in [place].    Past Medical History:  Diagnosis Date   Atrial fibrillation with normal ventricular rate (HCC)    CHF (congestive heart failure) (HCC)    COPD (chronic obstructive pulmonary disease) (HCC)    Diabetes mellitus without complication (HCC)    Hx of blood clots    Hypertension     History reviewed. No pertinent surgical history.  Prior to Admission medications   Medication Sig Start Date End Date Taking? Authorizing Provider  albuterol (PROVENTIL HFA;VENTOLIN HFA) 108 (90 BASE) MCG/ACT inhaler Inhale 2 puffs into the lungs every 6 (six) hours as needed for wheezing or shortness of breath.    [provider]  alendronate (FOSAMAX) 35 MG tablet Take 35 mg by mouth every 7 (seven) days. Take with a full glass of water on an empty stomach.    [provider]  aspirin 81 MG chewable tablet Chew 81 mg by mouth daily.    [provider]  bimatoprost (LUMIGAN) 0.01 % SOLN Place 1 drop into both eyes at bedtime. 11/04/19   [provider]  brimonidine (ALPHAGAN) 0.2 % ophthalmic  solution Place 1 drop into the left eye in the morning and at bedtime. 10/21/19   [provider]  busPIRone (BUSPAR) 10 MG tablet Take 10 mg by mouth 2 (two) times daily. 08/04/20   [provider]  calcium-vitamin D (OSCAL WITH D) 500-200 MG-UNIT per tablet Take 1 tablet by mouth.    [provider]  dorzolamide (TRUSOPT) 2 % ophthalmic solution Place 1 drop into both eyes at bedtime. 08/03/20   [provider]  furosemide (LASIX) 20 MG tablet Take 40 mg (2 tablets) tomorrow and the day after, then continue with 20 mg daily Patient taking differently: Take 20 mg by mouth daily. 04/15/17   Harris, Vernie Shanks, PA-C  gabapentin (NEURONTIN) 300 MG capsule Take 300 mg by mouth at bedtime. 08/10/20   [provider]  magnesium oxide (MAG-OX) 400 (241.3 Mg) MG tablet Take 1 tablet by mouth daily. 07/08/20   [provider]  MATZIM LA 240 MG 24 hr tablet Take 240 mg by mouth daily. 08/03/20   [provider]  metoprolol tartrate (LOPRESSOR) 25 MG tablet Take 1 tablet (25 mg total) by mouth 2 (two) times daily. 08/17/20   Nita Sells, MD  omeprazole (PRILOSEC) 20 MG capsule Take 20 mg by mouth daily.    [provider]  predniSONE (DELTASONE) 10 MG tablet Take 0.5 tablets (5 mg total) by mouth daily with breakfast. 08/17/20   Nita Sells, MD  warfarin (COUMADIN) 2 MG tablet Take 2-4 mg by mouth as directed. Take 1 tablet (2 mg) daily except Take 2 tablets (4 mg) on (Mon & Fri) 06/02/20   [provider]    Scheduled Meds:  busPIRone  10 mg Oral BID   gabapentin  300 mg Oral QHS   insulin aspart  0-9 Units Subcutaneous Q4H   predniSONE  30 mg Oral Q breakfast   Infusions:  sodium chloride     piperacillin-tazobactam (ZOSYN)  IV 3.375 g (01/21/21 0431)   PRN Meds: acetaminophen **OR** acetaminophen, metoprolol tartrate, morphine injection, ondansetron **OR** ondansetron (ZOFRAN) IV   Allergies as of 01/20/2021    (No Known Allergies)    Family History  Problem Relation Age of Onset   Stroke Mother    Diabetes Mother    Stroke Father  Social History   Socioeconomic History   Marital status: Widowed    Spouse name: Not on file   Number of children: Not on file   Years of education: Not on file   Highest education level: Not on file  Occupational History   Not on file  Tobacco Use   Smoking status: Former    Pack years: 0.00   Smokeless tobacco: Never  Substance and Sexual Activity   Alcohol use: No   Drug use: Not on file   Sexual activity: Not on file  Other Topics Concern   Not on file  Social History Narrative   Not on file   Social Determinants of Health   Financial Resource Strain: Not on file  Food Insecurity: Not on file  Transportation Needs: Not on file  Physical Activity: Not on file  Stress: Not on file  Social Connections: Not on file  Intimate Partner Violence: Not on file    REVIEW OF SYSTEMS: Constitutional: Generally has fair energy level.  Not currently complaining of new fatigue or weakness. ENT:  No nose bleeds Pulm: Breathing stable.  Occasional nonproductive cough. CV:  No palpitations.  Occasional but not current LE edema.  No angina. GU:  No hematuria, no frequency GI: See HPI. Heme: No recall of previous diagnosis with anemia or iron/B12/folate supplements.  No unusual or excessive bleeding or bruising but does develop small bruises easily. Transfusions: No recall of previous blood transfusions. Neuro:  No headaches, no seizures, no syncope.  Some neuropathy with tingling and numbness in her feet. Derm:  No itching, no rash or sores.  Endocrine:  No sweats or chills.  No polyuria or dysuria Immunization: Has been vaccinated for COVID-19. Travel:  None beyond local counties in last few months.    PHYSICAL EXAM: Vital signs in last 24 hours: Vitals:   01/21/21 0234 01/21/21 0439  BP: 97/68 (!) 96/58  Pulse: 93 90  Resp: 18 16  Temp:  97.6 F (36.4 C) 97.7 F (36.5 C)  SpO2: 95% 96%   Wt Readings from Last 3 Encounters:  01/20/21 76.7 kg  12/30/20 76.7 kg  08/15/20 77.1 kg    General: Pleasant, elderly, comfortable.  Speech is a bit garbled and difficult to understand. Head: No facial asymmetry or swelling.  Pemphigus skin changes at the top of scalp. Eyes: No conjunctival pallor or scleral icterus.  EOMI. Ears: Not hard of hearing. Nose: No congestion or discharge Mouth: Edentulous with upper dentures in place.  Mucosa is moist, pink, clear.  Tongue is midline. Neck: JVD, no masses, no thyromegaly Lungs: Occasional dry cough.  No shortness of breath.  Lungs are clear bilaterally though overall breath sounds are reduced. Heart: Irregularly irregular, rate in 90s.  No MRG. Abdomen: Soft.  Minimally tender (no antalgic reaction but did say it was a bit sore) on palpation of left upper quadrant.  Bowel sounds active.  Not distended.  No HSM, no bruits, no masses..   Rectal: Deferred Musc/Skeltl: No obvious joint deformities. Extremities: No CCE.  Do not appreciate dorsalis pedal pulses but feet are warm to the touch. Neurologic: Alert.  Appropriate.  Oriented to place, person.  Had a little bit of difficulty remembering what day it was but was in the general ballpark of Friday.  Moves all 4 limbs without tremor or gross deficits, strength not tested. Skin: Changes of pemphigus on her scalp. Nodes: No cervical adenopathy Psych: Pleasant, cooperative, calm.  Intake/Output from previous day: 07/07 0701 -  07/08 0700 In: 578.1 [IV Piggyback:578.1] Out: -  Intake/Output this shift: No intake/output data recorded.  LAB RESULTS: Recent Labs    01/20/21 1500 01/21/21 0226  WBC 14.4* 12.0*  HGB 14.2 14.5  HCT 42.1 44.6  PLT 182 154   BMET Lab Results  Component Value Date   NA 136 01/21/2021   NA 134 (L) 01/20/2021   NA 138 08/16/2020   K 4.1 01/21/2021   K 4.5 01/20/2021   K 4.2 08/16/2020   CL 105  01/21/2021   CL 105 01/20/2021   CL 103 08/16/2020   CO2 18 (L) 01/21/2021   CO2 21 (L) 01/20/2021   CO2 25 08/16/2020   GLUCOSE 67 (L) 01/21/2021   GLUCOSE 142 (H) 01/20/2021   GLUCOSE 116 (H) 08/16/2020   BUN 18 01/21/2021   BUN 15 01/20/2021   BUN 14 08/16/2020   CREATININE 1.56 (H) 01/21/2021   CREATININE 1.12 (H) 01/20/2021   CREATININE 0.92 08/16/2020   CALCIUM 9.1 01/21/2021   CALCIUM 8.9 01/20/2021   CALCIUM 8.9 08/16/2020   LFT Recent Labs    01/20/21 1500 01/20/21 1850 01/21/21 0158  PROT 7.2  --  6.4*  ALBUMIN 3.3*  --  3.1*  AST 442*  --  247*  ALT 383*  --  309*  ALKPHOS 187*  --  173*  BILITOT 2.3* 2.6* 3.0*  BILIDIR  --  1.0*  --   IBILI  --  1.6*  --    PT/INR Lab Results  Component Value Date   INR 1.6 (H) 01/21/2021   INR 1.8 (H) 01/20/2021   INR 2.0 (H) 08/17/2020   Hepatitis Panel No results for input(s): HEPBSAG, HCVAB, HEPAIGM, HEPBIGM in the last 72 hours. C-Diff No components found for: CDIFF Lipase     Component Value Date/Time   LIPASE 22 01/20/2021 1500    Drugs of Abuse  No results found for: LABOPIA, COCAINSCRNUR, LABBENZ, AMPHETMU, THCU, LABBARB   RADIOLOGY STUDIES: CT Angio Chest PE W/Cm &/Or Wo Cm  Result Date: 01/20/2021 CLINICAL DATA:  PE suspected, low/intermediate prob, positive D-dimer Vomiting since last night. EXAM: CT ANGIOGRAPHY CHEST WITH CONTRAST TECHNIQUE: Multidetector CT imaging of the chest was performed using the standard protocol during bolus administration of intravenous contrast. Multiplanar CT image reconstructions and MIPs were obtained to evaluate the vascular anatomy. CONTRAST:  134m OMNIPAQUE IOHEXOL 350 MG/ML SOLN COMPARISON:  Radiograph earlier today.  Chest CTA 11/10/2019 FINDINGS: Cardiovascular: There are no filling defects within the pulmonary arteries to suggest pulmonary embolus. Diffuse aortic atherosclerosis and tortuosity. No aortic aneurysm. Cannot assess for dissection given phase of contrast  tailored to pulmonary artery evaluation. There are coronary artery calcifications. Mild cardiomegaly. No pericardial effusion. Mediastinum/Nodes: No enlarged mediastinal or hilar lymph nodes. Decompressed esophagus. Small hiatal hernia. No thyroid nodule. Lungs/Pleura: No acute airspace disease. No pleural effusion. No pulmonary edema. No pulmonary nodule or mass. The trachea and central bronchi are patent. Upper Abdomen: Cyst on concurrent abdominopelvic CT, reported separately. Musculoskeletal: Moderate thoracic scoliosis. 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 There are no acute or suspicious osseous abnormalities. Review of the MIP images confirms the above findings. IMPRESSION: 1. No pulmonary embolus or acute intrathoracic abnormality. 2. Diffuse aortic atherosclerosis and tortuosity. Coronary artery calcifications. Mild cardiomegaly. Aortic Atherosclerosis (ICD10-I70.0). Electronically Signed   By: MKeith RakeM.D.   On: 01/20/2021 16:51   CT Abdomen Pelvis W Contrast  Result Date: 01/20/2021 CLINICAL DATA:  Nausea and vomiting. EXAM: CT ABDOMEN AND  PELVIS WITH CONTRAST TECHNIQUE: Multidetector CT imaging of the abdomen and pelvis was performed using the standard protocol following bolus administration of intravenous contrast. CONTRAST:  163m OMNIPAQUE IOHEXOL 350 MG/ML SOLN COMPARISON:  Abdominopelvic CT 11/09/2020 FINDINGS: Lower chest: Assessed on concurrent chest CT, reported separately. Hepatobiliary: Previous subcentimeter low-density in the lateral right liver is not well seen on the current exam. Multiple stones in the gallbladder with gallbladder distension and mild wall thickening/pericholecystic edema, for example series 7, image 62. There is intra and extrahepatic biliary ductal dilatation with noncalcified filling defects in the common bile duct, for example series 4, image 30 and 33. Common bile duct measures 10 mm. Pancreas: Parenchymal atrophy. No ductal dilatation or  inflammation. Spleen: Normal in size without focal abnormality. Adrenals/Urinary Tract: Normal adrenal glands. 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 There is no hydronephrosis or perinephric edema. Symmetric renal excretion on delayed phase imaging. Stable cyst in the lower left kidney. Unremarkable urinary bladder. Stomach/Bowel: Tiny hiatal hernia. Decompressed stomach. No small bowel obstruction or inflammation normal appendix. Multifocal colonic diverticulosis, most prominently affecting the left colon, without diverticulitis no colonic inflammation. Vascular/Lymphatic: Advanced aortic atherosclerosis. Advanced aortic branch atherosclerosis. 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 Portal vein is patent. No portal venous or mesenteric gas. Infrarenal IVC filter in place. No enlarged lymph nodes in the abdomen or pelvis. Reproductive: Atrophic uterus, normal for age. Quiescent ovaries. No adnexal mass. Other: No ascites, free air or focal fluid collection. Musculoskeletal: No acute osseous abnormalities are seen. 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 Prominent lumbar facet hypertrophy. IMPRESSION: 1. Cholelithiasis with gallbladder distension and mild wall thickening/pericholecystic edema, suspicious for acute cholecystitis. 2. Intra and extrahepatic biliary ductal dilatation with noncalcified filling defects in the common bile duct, suspicious for choledocholithiasis. 3. Colonic diverticulosis without diverticulitis. Aortic Atherosclerosis (ICD10-I70.0). Electronically Signed   By: MKeith RakeM.D.   On: 01/20/2021 16:57   DG Chest Portable 1 View  Result Date: 01/20/2021 CLINICAL DATA:  Cough shortness of breath, LEFT-sided pain since last night. EXAM: PORTABLE CHEST 1 VIEW COMPARISON:  August 15, 2020. FINDINGS: EKG leads project over the chest. Cardiomediastinal contours show persistent cardiac enlargement and fullness of central  pulmonary vasculature. No frank edema. No consolidation. No pleural effusion on frontal radiograph. On limited assessment no acute skeletal process. IMPRESSION: Cardiomegaly and central vascular congestion without frank edema. Similar to prior chest x-ray. Electronically Signed   By: GZetta BillsM.D.   On: 01/20/2021 15:16   UKoreaAbdomen Limited RUQ (LIVER/GB)  Result Date: 01/20/2021 CLINICAL DATA:  Right upper quadrant pain with vomiting EXAM: ULTRASOUND ABDOMEN LIMITED RIGHT UPPER QUADRANT COMPARISON:  CT 01/20/2021 FINDINGS: Gallbladder: Small stones in the gallbladder. Gallbladder is distended. Slight increased wall thickness up to 6 mm but no sonographic Murphy. Common bile duct: Diameter: 5.6 mm Liver: No focal lesion identified. Within normal limits in parenchymal echogenicity. Portal vein is patent on color Doppler imaging with normal direction of blood flow towards the liver. Other: None. IMPRESSION: 1. Distended gallbladder with stones and slight wall thickening but negative sonographic Murphy. Correlation with nuclear medicine hepatobiliary imaging could be obtained if continued concern for acute cholecystitis Electronically Signed   By: KDonavan FoilM.D.   On: 01/20/2021 18:42     IMPRESSION:   Choledocholithiasis.  Cholelithiasis, possible cholecystitis.  General surgery evaluation pending.  Chronic Coumadin.  Hx DVT, status post IVC filter.  Atrial fibrillation.  Last Coumadin was 7/6, now on hold.  INR 1.6.  Pemphigus vulgaris of the scalp.  Treated with chronic steroids.  Now on stress dose steroids of 30 mg daily, baseline is 10 mg daily.  Elevated BNP to 1059, no clinical acute heart failure and CXR does not confirm any acute failure.  Troponins elevated at 74..  55.  No acute ischemia on EKG, no chest pain.  COPD.  Quit smoking about 8 years ago.  Peripheral vascular disease.  Tubular adenomatous polyp on colonoscopy 2011   PLAN:      ERCP tomorrow.  Continue Zosyn.   Added clear liquids.  Continue to hold Coumadin.    Aaliah Jorgenson  01/21/2021, 8:19 AM Phone [phone]  GI ATTENDING  History, laboratories, x-rays reviewed.  Patient personally seen and examined.  Agree with comprehensive consultation note as outlined above.  Patient presents with symptomatic cholelithiasis and probable choledocholithiasis.  Multiple significant medical problems including history of DVT and atrial fibrillation for which she is on Coumadin.  Elevated BNP noted.  Plans for ERCP with sphincterotomy and stone extraction tomorrow.  The patient is HIGH RISK given her age and comorbidities as well as her chronic anticoagulation issues.The nature of the procedure, as well as the risks, benefits, and alternatives were carefully and thoroughly reviewed with the patient. Ample time for discussion and questions allowed. The patient understood, was satisfied, and agreed to proceed.  We will check PT/INR in a.m.  Plans for ERCP have been made for tomorrow at noon.  Docia Chuck. Geri Seminole., M.D. [facility] Division of Gastroenterology

## 2021-01-22 ENCOUNTER — Inpatient Hospital Stay (HOSPITAL_COMMUNITY): Payer: Medicare Other

## 2021-01-22 ENCOUNTER — Inpatient Hospital Stay (HOSPITAL_COMMUNITY): Payer: Medicare Other | Admitting: Anesthesiology

## 2021-01-22 ENCOUNTER — Encounter (HOSPITAL_COMMUNITY)
Admission: EM | Disposition: A | Discharge status: Home / Self Care | Payer: Self-pay | Source: Home / Self Care | Attending: Family Medicine

## 2021-01-22 ENCOUNTER — Encounter (HOSPITAL_COMMUNITY): Payer: Self-pay | Admitting: Internal Medicine

## 2021-01-22 DIAGNOSIS — K8050 Calculus of bile duct without cholangitis or cholecystitis without obstruction: Secondary | ICD-10-CM

## 2021-01-22 HISTORY — PX: REMOVAL OF STONES: SHX5545

## 2021-01-22 HISTORY — PX: ENDOSCOPIC RETROGRADE CHOLANGIOPANCREATOGRAPHY (ERCP) WITH PROPOFOL: SHX5810

## 2021-01-22 HISTORY — PX: SPHINCTEROTOMY: SHX5544

## 2021-01-22 LAB — PROTIME-INR
INR: 1.5 — ABNORMAL HIGH (ref 0.8–1.2)
Prothrombin Time: 18.3 seconds — ABNORMAL HIGH (ref 11.4–15.2)

## 2021-01-22 LAB — GLUCOSE, CAPILLARY
Glucose-Capillary: 100 mg/dL — ABNORMAL HIGH (ref 70–99)
Glucose-Capillary: 102 mg/dL — ABNORMAL HIGH (ref 70–99)
Glucose-Capillary: 134 mg/dL — ABNORMAL HIGH (ref 70–99)
Glucose-Capillary: 196 mg/dL — ABNORMAL HIGH (ref 70–99)
Glucose-Capillary: 281 mg/dL — ABNORMAL HIGH (ref 70–99)
Glucose-Capillary: 97 mg/dL (ref 70–99)

## 2021-01-22 LAB — COMPREHENSIVE METABOLIC PANEL
ALT: 183 U/L — ABNORMAL HIGH (ref 0–44)
AST: 76 U/L — ABNORMAL HIGH (ref 15–41)
Albumin: 2.7 g/dL — ABNORMAL LOW (ref 3.5–5.0)
Alkaline Phosphatase: 140 U/L — ABNORMAL HIGH (ref 38–126)
Anion gap: 8 (ref 5–15)
BUN: 16 mg/dL (ref 8–23)
CO2: 20 mmol/L — ABNORMAL LOW (ref 22–32)
Calcium: 8.7 mg/dL — ABNORMAL LOW (ref 8.9–10.3)
Chloride: 109 mmol/L (ref 98–111)
Creatinine, Ser: 1.03 mg/dL — ABNORMAL HIGH (ref 0.44–1.00)
GFR, Estimated: 55 mL/min — ABNORMAL LOW (ref >60)
Glucose, Bld: 107 mg/dL — ABNORMAL HIGH (ref 70–99)
Potassium: 4.1 mmol/L (ref 3.5–5.1)
Sodium: 137 mmol/L (ref 135–145)
Total Bilirubin: 0.9 mg/dL (ref 0.3–1.2)
Total Protein: 6.5 g/dL (ref 6.5–8.1)

## 2021-01-22 LAB — URINE CULTURE

## 2021-01-22 LAB — ABO/RH
ABO/RH(D): B POS
ABO/RH(D): B POS

## 2021-01-22 LAB — CBC
HCT: 41.7 % (ref 36.0–46.0)
Hemoglobin: 14 g/dL (ref 12.0–15.0)
MCH: 29.1 pg (ref 26.0–34.0)
MCHC: 33.6 g/dL (ref 30.0–36.0)
MCV: 86.7 fL (ref 80.0–100.0)
Platelets: 152 10*3/uL (ref 150–400)
RBC: 4.81 MIL/uL (ref 3.87–5.11)
RDW: 15.9 % — ABNORMAL HIGH (ref 11.5–15.5)
WBC: 12.3 10*3/uL — ABNORMAL HIGH (ref 4.0–10.5)
nRBC: 0 % (ref 0.0–0.2)

## 2021-01-22 SURGERY — ENDOSCOPIC RETROGRADE CHOLANGIOPANCREATOGRAPHY (ERCP) WITH PROPOFOL
Anesthesia: General

## 2021-01-22 MED ORDER — SODIUM CHLORIDE 0.9% IV SOLUTION: Freq: Once | INTRAVENOUS | Status: AC

## 2021-01-22 MED ORDER — DEXAMETHASONE SODIUM PHOSPHATE 10 MG/ML IJ SOLN
INTRAMUSCULAR | Status: DC | PRN
  Administered 2021-01-22: 5 mg via INTRAVENOUS

## 2021-01-22 MED ORDER — DEXMEDETOMIDINE (PRECEDEX) IN NS 20 MCG/5ML (4 MCG/ML) IV SYRINGE
PREFILLED_SYRINGE | INTRAVENOUS | Status: DC | PRN
  Administered 2021-01-22: 2 ug via INTRAVENOUS
  Administered 2021-01-22: 4 ug via INTRAVENOUS

## 2021-01-22 MED ORDER — CIPROFLOXACIN IN D5W 400 MG/200ML IV SOLN
INTRAVENOUS | Status: AC
  Filled 2021-01-22: qty 200

## 2021-01-22 MED ORDER — FUROSEMIDE 10 MG/ML IJ SOLN
40.0000 mg | Freq: Once | INTRAMUSCULAR | Status: AC
  Administered 2021-01-22: 40 mg via INTRAVENOUS
  Filled 2021-01-22: qty 4

## 2021-01-22 MED ORDER — LIDOCAINE HCL (PF) 2 % IJ SOLN
INTRAMUSCULAR | Status: DC | PRN
  Administered 2021-01-22: 60 mg via INTRADERMAL

## 2021-01-22 MED ORDER — CIPROFLOXACIN IN D5W 400 MG/200ML IV SOLN
INTRAVENOUS | Status: DC | PRN
  Administered 2021-01-22: 400 mg via INTRAVENOUS

## 2021-01-22 MED ORDER — FENTANYL CITRATE (PF) 250 MCG/5ML IJ SOLN
INTRAMUSCULAR | Status: AC
  Filled 2021-01-22: qty 5

## 2021-01-22 MED ORDER — SUGAMMADEX SODIUM 200 MG/2ML IV SOLN
INTRAVENOUS | Status: DC | PRN
  Administered 2021-01-22: 200 mg via INTRAVENOUS

## 2021-01-22 MED ORDER — SODIUM CHLORIDE 0.9 % IV SOLN
INTRAVENOUS | Status: DC | PRN
  Administered 2021-01-22: 25 mL

## 2021-01-22 MED ORDER — GLUCAGON HCL RDNA (DIAGNOSTIC) 1 MG IJ SOLR
INTRAMUSCULAR | Status: DC | PRN
  Administered 2021-01-22: .5 mg via INTRAVENOUS

## 2021-01-22 MED ORDER — GLUCAGON HCL RDNA (DIAGNOSTIC) 1 MG IJ SOLR
INTRAMUSCULAR | Status: AC
  Filled 2021-01-22: qty 1

## 2021-01-22 MED ORDER — ASPIRIN 81 MG PO CHEW
81.0000 mg | CHEWABLE_TABLET | Freq: Every day | ORAL | Status: DC
  Administered 2021-01-23: 81 mg via ORAL
  Filled 2021-01-22 (×2): qty 1

## 2021-01-22 MED ORDER — METOPROLOL TARTRATE 25 MG PO TABS
25.0000 mg | ORAL_TABLET | Freq: Two times a day (BID) | ORAL | Status: DC
  Administered 2021-01-22 – 2021-01-23 (×4): 25 mg via ORAL
  Filled 2021-01-22 (×5): qty 1

## 2021-01-22 MED ORDER — INDOMETHACIN 50 MG RE SUPP
RECTAL | Status: DC | PRN
  Administered 2021-01-22: 100 mg via RECTAL

## 2021-01-22 MED ORDER — FENTANYL CITRATE (PF) 100 MCG/2ML IJ SOLN
INTRAMUSCULAR | Status: DC | PRN
  Administered 2021-01-22 (×2): 50 ug via INTRAVENOUS

## 2021-01-22 MED ORDER — PROPOFOL 10 MG/ML IV BOLUS
INTRAVENOUS | Status: DC | PRN
  Administered 2021-01-22: 80 mg via INTRAVENOUS

## 2021-01-22 MED ORDER — ONDANSETRON HCL 4 MG/2ML IJ SOLN
INTRAMUSCULAR | Status: DC | PRN
  Administered 2021-01-22: 4 mg via INTRAVENOUS

## 2021-01-22 MED ORDER — ROCURONIUM BROMIDE 10 MG/ML (PF) SYRINGE
PREFILLED_SYRINGE | INTRAVENOUS | Status: DC | PRN
  Administered 2021-01-22: 50 mg via INTRAVENOUS
  Administered 2021-01-22: 20 mg via INTRAVENOUS

## 2021-01-22 MED ORDER — LACTATED RINGERS IV SOLN: INTRAVENOUS | Status: DC | PRN

## 2021-01-22 MED ORDER — FUROSEMIDE 20 MG PO TABS
20.0000 mg | ORAL_TABLET | Freq: Every day | ORAL | Status: DC
  Administered 2021-01-23: 20 mg via ORAL
  Filled 2021-01-22 (×2): qty 1

## 2021-01-22 MED ORDER — INDOMETHACIN 50 MG RE SUPP
RECTAL | Status: AC
  Filled 2021-01-22: qty 2

## 2021-01-22 NOTE — Evaluation (Signed)
Physical Therapy Evaluation & Discharge Patient Details Name: Jill Crane MRN: [id number] DOB: Apr 09, 1941 Today's Date: 01/22/2021   History of Present Illness  Pt is a 80 y.o. female who presented 7/7 with NBNB emesis. CT reveals findings suspicious for cholecystitis and findings suspicious for choledocholithiasis. PMH: a-fib, CHF, COPD, DM, DVTs, HTN.   Clinical Impression  Pt presents with condition above. PTA, pt was ambulating household distances without AD/AE independently, living with her granddaughter in a 1-level house with a level entry. Pt has intermittent assistance available at home. Pt receives assistance from family for transportation and financial and medication management. Currently, pt appears and reports to be functioning at her baseline, not needing any physical assistance for any functional mobility. She does display generalized weakness and some mild balance deficits, which she reports is due to her heel spurs. Her balance with gait does improve when she has her shoes donned. She tends to display appropriate gait changes to accommodate to ensure she maintains her balance safely. She denies any falls in the past year. Pt is at a low fall risk and she verbalized understanding. No PT follow-up necessary as pt is at her baseline and is agreeable to this plan. All education completed and questions answered. PT will sign off at this time, thank you for this referral.    Follow Up Recommendations No PT follow up;Supervision - Intermittent    Equipment Recommendations  None recommended by PT    Recommendations for Other Services       Precautions / Restrictions Precautions Precautions: Fall (low) Restrictions Weight Bearing Restrictions: No      Mobility  Bed Mobility Overal bed mobility: Independent             General bed mobility comments: Pt able to perform supine > sit L EOB with bed flat and rails down without difficulty.    Transfers Overall transfer  level: Independent Equipment used: None             General transfer comment: Sit to stand from various low surfaces, including EOB 2x and couch 2x, no overt LOB. Minor instability noted.  Ambulation/Gait Ambulation/Gait assistance: Min guard;Supervision Gait Distance (Feet): 240 Feet (x4 bouts of ~240 ft > ~8 ft > ~215 ft > ~5 ft) Assistive device: None Gait Pattern/deviations: Step-through pattern;Decreased stride length Gait velocity: reduced Gait velocity interpretation: >2.62 ft/sec, indicative of community ambulatory General Gait Details: Pt ambulates first bout with socks on, displaying tendency to reach for objects for support initially as she warmed up getting OOB for the first time this morning. Her balance improved with distance but continued to display intermittent lateral trunk sway, no physical assistance needed to regain balance. Final bouts, donned shoes as pt reported heel spurs impacting her balance, with noted improved gait speed and stability but still several moments of minor lateral trunk sway, which increased with changes in head position. Min guard-supervision for safety.  Stairs            Wheelchair Mobility    Modified Rankin (Stroke Patients Only)       Balance Overall balance assessment: Mild deficits observed, not formally tested                                           Pertinent Vitals/Pain Pain Assessment: Faces Faces Pain Scale: Hurts a little bit Pain Location: L posterior knee and heels  during gait Pain Descriptors / Indicators: Discomfort;Guarding;Grimacing Pain Intervention(s): Limited activity within patient's tolerance;Monitored during session;Repositioned    Home Living Family/patient expects to be discharged to:: Private residence Living Arrangements: Other relatives (granddaughter) Available Help at Discharge: Family;Available PRN/intermittently Type of Home: House Home Access: Level entry     Home  Layout: One level Home Equipment: None      Prior Function Level of Independence: Independent         Comments: Except daughter and granddaughter help with medication and financial management and transportation.     Hand Dominance   Dominant Hand: Right    Extremity/Trunk Assessment   Upper Extremity Assessment Upper Extremity Assessment: Generalized weakness    Lower Extremity Assessment Lower Extremity Assessment: Generalized weakness    Cervical / Trunk Assessment Cervical / Trunk Assessment: Kyphotic  Communication   Communication: No difficulties  Cognition Arousal/Alertness: Awake/alert Behavior During Therapy: WFL for tasks assessed/performed Overall Cognitive Status: Within Functional Limits for tasks assessed                                 General Comments: A&Ox4.      General Comments General comments (skin integrity, edema, etc.): Denies any falls in past year    Exercises     Assessment/Plan    PT Assessment Patent does not need any further PT services  PT Problem List         PT Treatment Interventions      PT Goals (Current goals can be found in the Care Plan section)  Acute Rehab PT Goals Patient Stated Goal: to get better PT Goal Formulation: All assessment and education complete, DC therapy Time For Goal Achievement: 01/23/21 Potential to Achieve Goals: Good    Frequency     Barriers to discharge        Co-evaluation               AM-PAC PT "6 Clicks" Mobility  Outcome Measure Help needed turning from your back to your side while in a flat bed without using bedrails?: None Help needed moving from lying on your back to sitting on the side of a flat bed without using bedrails?: None Help needed moving to and from a bed to a chair (including a wheelchair)?: None Help needed standing up from a chair using your arms (e.g., wheelchair or bedside chair)?: None Help needed to walk in hospital room?: A  Little Help needed climbing 3-5 steps with a railing? : A Little 6 Click Score: 22    End of Session Equipment Utilized During Treatment: Gait belt Activity Tolerance: Patient tolerated treatment well Patient left: in chair;with call bell/phone within reach;with chair alarm set Nurse Communication: Mobility status PT Visit Diagnosis: Unsteadiness on feet (R26.81);Other abnormalities of gait and mobility (R26.89);Muscle weakness (generalized) (M62.81);Difficulty in walking, not elsewhere classified (R26.2)    Time: 0355-9741 PT Time Calculation (min) (ACUTE ONLY): 32 min   Charges:   PT Evaluation $PT Eval Low Complexity: 1 Low PT Treatments $Gait Training: 8-22 mins        Raymond Gurney, PT, DPT Acute Rehabilitation Services  Pager: [phone] Office: [phone]   Jewel Baize 01/22/2021, 8:35 AM

## 2021-01-22 NOTE — Anesthesia Postprocedure Evaluation (Signed)
Anesthesia Post Note  Patient: EMELYNN RANCE  Procedure(s) Performed: ENDOSCOPIC RETROGRADE CHOLANGIOPANCREATOGRAPHY (ERCP) WITH PROPOFOL SPHINCTEROTOMY REMOVAL OF STONES     Patient location during evaluation: Endoscopy Anesthesia Type: General Level of consciousness: awake and alert Pain management: pain level controlled Vital Signs Assessment: post-procedure vital signs reviewed and stable Respiratory status: spontaneous breathing, nonlabored ventilation and respiratory function stable Cardiovascular status: blood pressure returned to baseline and stable Postop Assessment: no apparent nausea or vomiting Anesthetic complications: no   No notable events documented.  Last Vitals:  Vitals:   01/22/21 1340 01/22/21 1354  BP: (!) 158/89 (!) 147/62  Pulse: 95 84  Resp: 18 20  Temp:    SpO2: 98% 95%    Last Pain:  Vitals:   01/22/21 1333  TempSrc: Axillary  PainSc: 0-No pain                 Giles Currie,W. EDMOND

## 2021-01-22 NOTE — Transfer of Care (Signed)
Immediate Anesthesia Transfer of Care Note  Patient: Jill Crane  Procedure(s) Performed: ENDOSCOPIC RETROGRADE CHOLANGIOPANCREATOGRAPHY (ERCP) WITH PROPOFOL SPHINCTEROTOMY REMOVAL OF STONES  Patient Location: Endoscopy Unit  Anesthesia Type:General  Level of Consciousness: awake and confused  Airway & Oxygen Therapy: Patient Spontanous Breathing and Patient connected to nasal cannula oxygen  Post-op Assessment: Report given to RN  Post vital signs: Reviewed and stable  Last Vitals:  Vitals Value Taken Time  BP 107/78 01/22/21 1333  Temp 36.4 C 01/22/21 1333  Pulse 110 01/22/21 1333  Resp 18 01/22/21 1333  SpO2 97 % 01/22/21 1333    Last Pain:  Vitals:   01/22/21 1333  TempSrc: Axillary  PainSc: 0-No pain         Complications: No notable events documented.

## 2021-01-22 NOTE — Progress Notes (Signed)
Consent for FFP signed. RN explained the usage along with trnx reaction. Awaiting for lab to call for products.

## 2021-01-22 NOTE — Progress Notes (Signed)
Report given to [place] in Endo. Per nurse, return FFP to lab and if needed, ENDO staffs will transfuse. FFP returned to blood bank.

## 2021-01-22 NOTE — Interval H&P Note (Signed)
History and Physical Interval Note:  01/22/2021 11:32 AM  Jill Crane  has presented today for surgery, with the diagnosis of Choledocholithiasis..  The various methods of treatment have been discussed with the patient and family. After consideration of risks, benefits and other options for treatment, the patient has consented to  Procedure(s): ENDOSCOPIC RETROGRADE CHOLANGIOPANCREATOGRAPHY (ERCP) WITH PROPOFOL (N/A) as a surgical intervention.  The patient's history has been reviewed, patient examined, no change in status, stable for surgery.  I have reviewed the patient's chart and labs.  Questions were answered to the patient's satisfaction.     Yancey Flemings

## 2021-01-22 NOTE — Op Note (Signed)
[facility] Patient Name: Jill Crane Procedure Date : 01/22/2021 MRN: [id number] Attending MD: Wilhemina Bonito. Marina Goodell , MD Date of Birth: 09-20-1940 CSN: [id number] Age: 80 Admit Type: Inpatient Procedure:                ERCP with sphincterotomy and common bile duct stone                            extraction Indications:              Abdominal pain in the right upper quadrant, Bile                            duct stone on Computed Tomogram Scan, Abnormal                            liver function test Providers:                Wilhemina Bonito. Marina Goodell, MD, Clearnce Sorrel, RN, Michele Mcalpine                            Technician Referring MD:              Medicines:                General Anesthesia Complications:            No immediate complications. Estimated Blood Loss:     Estimated blood loss: none. Procedure:                Pre-Anesthesia Assessment:                           - Prior to the procedure, a History and Physical                            was performed, and patient medications and                            allergies were reviewed. The patient is competent.                            The risks and benefits of the procedure and the                            sedation options and risks were discussed with the                            patient. All questions were answered and informed                            consent was obtained. Patient identification and                            proposed procedure were verified by the physician.  Mental Status Examination: alert and oriented.                            Airway Examination: normal oropharyngeal airway and                            neck mobility. Respiratory Examination: clear to                            auscultation. CV Examination: normal. Prophylactic                            Antibiotics: The patient does not require                            prophylactic antibiotics. Prior  Anticoagulants: The                            patient has taken Coumadin (warfarin), last dose                            was 3 days prior to procedure. ASA Grade                            Assessment: III - A patient with severe systemic                            disease. After reviewing the risks and benefits,                            the patient was deemed in satisfactory condition to                            undergo the procedure. The anesthesia plan was to                            use moderate sedation / analgesia (conscious                            sedation). Immediately prior to administration of                            medications, the patient was re-assessed for                            adequacy to receive sedatives. The heart rate,                            respiratory rate, oxygen saturations, blood                            pressure, adequacy of pulmonary ventilation, and  response to care were monitored throughout the                            procedure. The physical status of the patient was                            re-assessed after the procedure.                           After obtaining informed consent, the scope was                            passed under direct vision. Throughout the                            procedure, the patient's blood pressure, pulse, and                            oxygen saturations were monitored continuously. The                            TJF- Q180V ([id number]) Olympus duodenoscope was                            introduced through the mouth, and used to inject                            contrast into and used to inject contrast into the                            bile duct. The ERCP was accomplished without                            difficulty. The patient tolerated the procedure                            well. Scope In: Scope Out: Findings:      ENDOSCOPIC EXAM:      1. The esophagus was  blindly intubated. The stomach and duodenum were       grossly normal. Minor ampulla was not sought. The major ampulla was       normal. There was a small periampullary diverticulum      X-ray FINDINGS:      1. A scout radiograph of the abdomen with the endoscope in position       revealed previously placed IVC filter.      2. Common bile duct was selectively cannulated on the first attempt.       Filling of contrast throughout revealed moderate extrahepatic and mild       intrahepatic ductal dilation. Several filling defects.      3. Over an 035 hydrophilic straight guidewire a biliary sphincterotomy       was performed using ERBE system. Cutting was in the 12:00 orientation.       Sphincterotomy was deemed moderate to large in size. No bleeding.      4. Several cholesterol type soft  stones were removed from the common       bile duct using a 15 mm balloon. Post extraction occlusion cholangiogram       did not reveal any residual filling defects. Drainage was excellent      5. There was no manipulation or injection of the pancreatic duct, by       intent Impression:               1. Choledocholithiasis status post ERC with                            sphincterotomy and successful stone extraction.                           2. Cholelithiasis Recommendation:           - 1. Standard post ERCP care and observation                           2. Low-fat diet                           3. Laparoscopic cholecystectomy per general surgery                           GI WILL SIGN OFF AT THIS TIME. However, we are                            available for any questions or problems. Thanks. Procedure Code(s):        --- Professional ---                           [phone], Endoscopic retrograde                            cholangiopancreatography (ERCP); with removal of                            calculi/debris from biliary/pancreatic duct(s)                           [id number], Endoscopic retrograde                             cholangiopancreatography (ERCP); with                            sphincterotomy/papillotomy                           [phone], Endoscopic catheterization of the biliary                            ductal system, radiological supervision and                            interpretation Diagnosis Code(s):        --- Professional ---  K80.50, Calculus of bile duct without cholangitis                            or cholecystitis without obstruction                           R10.11, Right upper quadrant pain                           R94.5, Abnormal results of liver function studies                           K83.8, Other specified diseases of biliary tract CPT copyright 2019 [employer]. All rights reserved. The codes documented in this report are preliminary and upon coder review may  be revised to meet current compliance requirements. Wilhemina Bonito. Marina Goodell, MD 01/22/2021 1:34:52 PM This report has been signed electronically. Number of Addenda: 0

## 2021-01-22 NOTE — Progress Notes (Addendum)
Patient returned from procedure. VSS. Patient is a/ox4, daughter at bedside. Pt/daughter request to speak with attending MD.  Delton See reapplied, incision CDI.

## 2021-01-22 NOTE — Anesthesia Procedure Notes (Signed)
Procedure Name: Intubation Date/Time: 01/22/2021 12:30 PM Performed by: Barrington Ellison, CRNA Pre-anesthesia Checklist: Patient identified, Emergency Drugs available, Suction available and Patient being monitored Patient Re-evaluated:Patient Re-evaluated prior to induction Oxygen Delivery Method: Circle System Utilized Preoxygenation: Pre-oxygenation with 100% oxygen Induction Type: IV induction Ventilation: Mask ventilation without difficulty and Oral airway inserted - appropriate to patient size Laryngoscope Size: Mac and 3 Grade View: Grade I Tube type: Oral Tube size: 7.0 mm Number of attempts: 1 Airway Equipment and Method: Stylet and Oral airway Placement Confirmation: ETT inserted through vocal cords under direct vision, positive ETCO2 and breath sounds checked- equal and bilateral Secured at: 22 cm Tube secured with: Tape Dental Injury: Teeth and Oropharynx as per pre-operative assessment

## 2021-01-22 NOTE — Progress Notes (Signed)
Patient with oral meds this am w/ order for NPO. Pt shares she usually does not takes medication on am empty stomach. Meds hold until after surgical intervention today.

## 2021-01-22 NOTE — Progress Notes (Signed)
Day of Surgery   Subjective/Chief Complaint: Patient scheduled for ERCP today, although T. Bili has come down to normal range She reports that her LUQ and epigastric pain is improved Remains NPO No further nausea/ vomiting  Objective: Vital signs in last 24 hours: Temp:  [97.5 F (36.4 C)-98.1 F (36.7 C)] 97.8 F (36.6 C) (07/09 0429) Pulse Rate:  [82-89] 89 (07/09 0429) Resp:  [16-18] 18 (07/09 0429) BP: (93-138)/(62-84) 138/84 (07/09 0429) SpO2:  [94 %-98 %] 98 % (07/09 0429) Last BM Date: 01/20/21  Intake/Output from previous day: 07/08 0701 - 07/09 0700 In: 1984.2 [I.V.:1684.4; IV Piggyback:299.7] Out: 2300 [Urine:2300] Intake/Output this shift: No intake/output data recorded.   WDWN in NAD Abd - soft, non-tender; no palpable masses  Lab Results:  Recent Labs    01/21/21 0226 01/22/21 0518  WBC 12.0* 12.3*  HGB 14.5 14.0  HCT 44.6 41.7  PLT 154 152   BMET Recent Labs    01/21/21 0158 01/22/21 0518  NA 136 137  K 4.1 4.1  CL 105 109  CO2 18* 20*  GLUCOSE 67* 107*  BUN 18 16  CREATININE 1.56* 1.03*  CALCIUM 9.1 8.7*   PT/INR Recent Labs    01/21/21 0226 01/22/21 0518  LABPROT 18.9* 18.3*  INR 1.6* 1.5*   ABG No results for input(s): PHART, HCO3 in the last 72 hours.  Invalid input(s): PCO2, PO2  Studies/Results: CT Angio Chest PE W/Cm &/Or Wo Cm  Result Date: 01/20/2021 CLINICAL DATA:  PE suspected, low/intermediate prob, positive D-dimer Vomiting since last night. EXAM: CT ANGIOGRAPHY CHEST WITH CONTRAST TECHNIQUE: Multidetector CT imaging of the chest was performed using the standard protocol during bolus administration of intravenous contrast. Multiplanar CT image reconstructions and MIPs were obtained to evaluate the vascular anatomy. CONTRAST:  OMNIPAQUE IOHEXOL 350 MG/ML SOLN COMPARISON:  Radiograph earlier today.  Chest CTA 11/10/2019 FINDINGS: Cardiovascular: There are no filling defects within the pulmonary arteries to suggest  pulmonary embolus. Diffuse aortic atherosclerosis and tortuosity. No aortic aneurysm. Cannot assess for dissection given phase of contrast tailored to pulmonary artery evaluation. There are coronary artery calcifications. Mild cardiomegaly. No pericardial effusion. Mediastinum/Nodes: No enlarged mediastinal or hilar lymph nodes. Decompressed esophagus. Small hiatal hernia. No thyroid nodule. Lungs/Pleura: No acute airspace disease. No pleural effusion. No pulmonary edema. No pulmonary nodule or mass. The trachea and central bronchi are patent. Upper Abdomen: Cyst on concurrent abdominopelvic CT, reported separately. Musculoskeletal: Moderate thoracic scoliosis. 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 There are no acute or suspicious osseous abnormalities. Review of the MIP images confirms the above findings. IMPRESSION: 1. No pulmonary embolus or acute intrathoracic abnormality. 2. Diffuse aortic atherosclerosis and tortuosity. Coronary artery calcifications. Mild cardiomegaly. Aortic Atherosclerosis (ICD10-I70.0). Electronically Signed   By: Narda Rutherford M.D.   On: 01/20/2021 16:51   CT Abdomen Pelvis W Contrast  Result Date: 01/20/2021 CLINICAL DATA:  Nausea and vomiting. EXAM: CT ABDOMEN AND PELVIS WITH CONTRAST TECHNIQUE: Multidetector CT imaging of the abdomen and pelvis was performed using the standard protocol following bolus administration of intravenous contrast. CONTRAST:  OMNIPAQUE IOHEXOL 350 MG/ML SOLN COMPARISON:  Abdominopelvic CT 11/09/2020 FINDINGS: Lower chest: Assessed on concurrent chest CT, reported separately. Hepatobiliary: Previous subcentimeter low-density in the lateral right liver is not well seen on the current exam. Multiple stones in the gallbladder with gallbladder distension and mild wall thickening/pericholecystic edema, for example series 7, image 62. There is intra and extrahepatic biliary ductal dilatation with noncalcified filling defects in the  common  bile duct, for example series 4, image 30 and 33. Common bile duct measures 10 mm. Pancreas: Parenchymal atrophy. No ductal dilatation or inflammation. Spleen: Normal in size without focal abnormality. Adrenals/Urinary Tract: Normal adrenal glands. 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 There is no hydronephrosis or perinephric edema. Symmetric renal excretion on delayed phase imaging. Stable cyst in the lower left kidney. Unremarkable urinary bladder. Stomach/Bowel: Tiny hiatal hernia. Decompressed stomach. No small bowel obstruction or inflammation normal appendix. Multifocal colonic diverticulosis, most prominently affecting the left colon, without diverticulitis no colonic inflammation. Vascular/Lymphatic: Advanced aortic atherosclerosis. Advanced aortic branch atherosclerosis. 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 Portal vein is patent. No portal venous or mesenteric gas. Infrarenal IVC filter in place. No enlarged lymph nodes in the abdomen or pelvis. Reproductive: Atrophic uterus, normal for age. Quiescent ovaries. No adnexal mass. Other: No ascites, free air or focal fluid collection. Musculoskeletal: No acute osseous abnormalities are seen. 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 Prominent lumbar facet hypertrophy. IMPRESSION: 1. Cholelithiasis with gallbladder distension and mild wall thickening/pericholecystic edema, suspicious for acute cholecystitis. 2. Intra and extrahepatic biliary ductal dilatation with noncalcified filling defects in the common bile duct, suspicious for choledocholithiasis. 3. Colonic diverticulosis without diverticulitis. Aortic Atherosclerosis (ICD10-I70.0). Electronically Signed   By: Narda Rutherford M.D.   On: 01/20/2021 16:57   DG Chest Portable 1 View  Result Date: 01/20/2021 CLINICAL DATA:  Cough shortness of breath, LEFT-sided pain since last night. EXAM: PORTABLE CHEST 1 VIEW COMPARISON:  August 15, 2020.  FINDINGS: EKG leads project over the chest. Cardiomediastinal contours show persistent cardiac enlargement and fullness of central pulmonary vasculature. No frank edema. No consolidation. No pleural effusion on frontal radiograph. On limited assessment no acute skeletal process. IMPRESSION: Cardiomegaly and central vascular congestion without frank edema. Similar to prior chest x-ray. Electronically Signed   By: Donzetta Kohut M.D.   On: 01/20/2021 15:16   US Abdomen Limited RUQ (LIVER/GB)  Result Date: 01/20/2021 CLINICAL DATA:  Right upper quadrant pain with vomiting EXAM: ULTRASOUND ABDOMEN LIMITED RIGHT UPPER QUADRANT COMPARISON:  CT 01/20/2021 FINDINGS: Gallbladder: Small stones in the gallbladder. Gallbladder is distended. Slight increased wall thickness up to 6 mm but no sonographic Murphy. Common bile duct: Diameter: 5.6 mm Liver: No focal lesion identified. Within normal limits in parenchymal echogenicity. Portal vein is patent on color Doppler imaging with normal direction of blood flow towards the liver. Other: None. IMPRESSION: 1. Distended gallbladder with stones and slight wall thickening but negative sonographic Murphy. Correlation with nuclear medicine hepatobiliary imaging could be obtained if continued concern for acute cholecystitis Electronically Signed   By: Jasmine Pang M.D.   On: 01/20/2021 18:42    Anti-infectives: Anti-infectives (From admission, onward)    Start     Dose/Rate Route Frequency Ordered Stop   01/21/21 1500  cefTRIAXone (ROCEPHIN) 2 g in sodium chloride 0.9 % 100 mL IVPB        2 g 200 mL/hr over 30 Minutes Intravenous Every 24 hours 01/21/21 1413     01/21/21 1500  metroNIDAZOLE (FLAGYL) IVPB 500 mg        500 mg 100 mL/hr over 60 Minutes Intravenous Every 12 hours 01/21/21 1413     01/21/21 0400  piperacillin-tazobactam (ZOSYN) IVPB 3.375 g  Status:  Discontinued        3.375 g 12.5 mL/hr over 240 Minutes Intravenous Every 8 hours 01/21/21 0249 01/21/21 1405    01/20/21 1715  piperacillin-tazobactam (  ZOSYN) IVPB 3.375 g        3.375 g 100 mL/hr over 30 Minutes Intravenous  Once 01/20/21 1711 01/20/21 1754       Assessment/Plan: Choledocholithiasis Cholelithiasis with possible Cholecystitis - GI plans for ERCP today, although T. Bili is now normal. - Continue to hold Coumadin. Last INR 1.6. Ideally would be < 1.5 before surgery. Will plan for Lap Chole after ERCP Sunday or Monday if cleared by [facility]. - Cont abx - Would ask medical team to evaluate, determine if patient needs any additional workup and medically optimize before undergoing general anesthesia - Cont to trend labs - We will follow with you   FEN - currently NPO, OK to restart clears after ERCP, then NPO p MN.   IVF per [facility] VTE - SCDs, Coumadin on hold. INR  ID - Zosyn 7/7 >> Tmax 102.1. WBC 12   A. Fib on Chronic Coumadin - on hold Hx DVT on Chronic Coumadin - on hold. 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 On SSI HTN Hx of CHF - BNP > 1000 on admission. Mild cardiomegaly but no pulm edema on CT. On RA. Last Echo I can see in care everywhere from 06/18/18 has EF of 50-55% w/ no wall motion abn COPD Aortic atherosclerosis   Coronary artery calcifications - noted on CT yesterday  LOS: 2 days    Wynona Luna 01/22/2021

## 2021-01-22 NOTE — Progress Notes (Signed)
PROGRESS NOTE    Jill Crane  QQV:[id number] DOB: 04/27/1941 DOA: 01/20/2021 PCP: Zoila Shutter, MD      Brief Narrative:  Jill Crane is a 80 y.o. F with AF on warfarin, DM, COPD, Pemphigus on steroids, and hx VTE with IVC filter who presented with recurrent vomiting, Abdominal pain and epigastric/chest pain.    In the ER, AST/ALT >400, Tbili 2.3, WBC 14K.  CT imaging suggested cholecystitis with choledocholithiasis.           Assessment & Plan:  Acute cholecystitis with choledocholithiasis Patient admitted, started on antibiotics.  Enterology performed ERCP 7/9, multiple filling defects in the common bile duct, perform sphincterotomy and stone removal, no complications.  General surgery were consulted.  - Continue ceftriaxone and Flagyl - Hold warfarin - Clear liquids -Consult Gen Surg, appreciate expertise   From the perioperative standpoint, she has RCRI risk 1 due to hx CHF, indicating an average risk of CV event for her age group.  Continue baby aspirin.  Her CHF and COPD are well-compensated, no further work up needed.   Her chronic prednisone use may increase risk of post-operative infection  Give her history of recurrent VTE and IVC filter, I will plan to bridge with Lovenox post-op     Chronic diastolic CHF EF 50-55%.  Has elevated BNP but clinically is without congestive symptoms.  As she has had IVF and FFP given here, will give one dose IV Lasix and resume her home Lasix tomorrow. 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 -Close monitoring renal function -Avoid nephrotoxins and hypotension      Disposition: Status is: Inpatient  Remains inpatient appropriate because: Presented with cholecystitis, will undergo cholecystectomy then get home  Dispo: The patient is from: Home              Anticipated d/c is to: Home              Patient currently is not medically stable to d/c.   Difficult to place patient No              MDM: The below labs and imaging reports reviewed and summarized above.  Medication management as above.    DVT prophylaxis: N/A, she is chronically on warfarin, will have 24 hours holidat they restart anticoagualtion post op  Code Status: FULL Family Communication: Daughter at bedside           Subjective: No headache, chest pain, abdominal pain.  No dyspnea, dyspnea on exertion, orthopnea, no swelling.  Palpitations.  She feels at her baseline.        Objective: Vitals:   01/22/21 1333 01/22/21 1340 01/22/21 1354 01/22/21 1410  BP:  107/78 (!) 158/89 (!) 147/62 (!) 155/87  Pulse: (!) 110 95 84 89  Resp: 18 18 20 20   Temp: 97.6 F (36.4 C)   (!) 97.5 F (36.4 C)  TempSrc: Axillary   Oral  SpO2: 97% 98% 95% 97%  Weight:      Height:        Intake/Output Summary (Last 24 hours) at 01/22/2021 1453 Last data filed at 01/22/2021 1432 Gross per 24 hour  Intake 2973.19 ml  Output 2300 ml  Net 673.19 ml   Filed Weights   01/20/21 1258  Weight: 76.7 kg    Examination: General appearance: Overweight elderly  female, lying in bed, no acute distress, interactive     HEENT: Anicteric, conjunctive and lids and lashes normal, no nasal deformity, discharge, or epistaxis, oropharynx moist, no oral lesions. Skin: Normal Cardiac: Regular rate and rhythm, JVP normal, no lower extremity edema Respiratory: Respiratory effort normal, lungs clear without rales or wheezes Abdomen: Abdomen soft without tenderness palpation or guarding, no ascites or distention MSK:  Neuro: Awake and alert, extraocular movements intact, moves all extremities with normal strength and coordination, speech fluent Psych: Attention normal, affect blunted, judgment insight appear moderately impaired     Data Reviewed: I have personally reviewed following labs and imaging studies:  CBC: Recent Labs  Lab 01/20/21 1500 01/21/21 0226 01/22/21 0518  WBC 14.4* 12.0* 12.3*  NEUTROABS 11.9*  --   --   HGB 14.2 14.5 14.0  HCT 42.1 44.6 41.7  MCV 86.4 87.3 86.7  PLT 182 154 152   Basic Metabolic Panel: Recent Labs  Lab 01/20/21 1500 01/21/21 0158 01/22/21 0518  NA 134* 136 137  K 4.5 4.1 4.1  CL 105 105 109  CO2 21* 18* 20*  GLUCOSE 142* 67* 107*  BUN 15 18 16   CREATININE 1.12* 1.56* 1.03*  CALCIUM 8.9 9.1 8.7*   GFR: Estimated Creatinine Clearance: 45.4 mL/min (A) (by C-G formula based on SCr of 1.03 mg/dL (H)). Liver Function Tests: Recent Labs  Lab 01/20/21 1500 01/20/21 1850 01/21/21 0158 01/22/21 0518  AST 442*  --  247* 76*  ALT 383*  --  309* 183*  ALKPHOS 187*  --  173* 140*  BILITOT 2.3* 2.6* 3.0* 0.9  PROT 7.2  --  6.4* 6.5  ALBUMIN 3.3*  --  3.1* 2.7*   Recent Labs  Lab 01/20/21 1500  LIPASE 22   No results for input(s): AMMONIA in the last 168 hours. Coagulation Profile: Recent Labs  Lab 01/20/21 1500 01/21/21 0226 01/22/21 0518  INR 1.8* 1.6* 1.5*   Cardiac Enzymes: No results for input(s): CKTOTAL, CKMB, CKMBINDEX, TROPONINI in the last 168 hours. BNP (last 3 results) No results  for input(s): PROBNP in the last 8760 hours. HbA1C: Recent Labs    01/21/21 0233  HGBA1C 7.0*   CBG: Recent Labs  Lab 01/21/21 2011 01/22/21 0010 01/22/21 0425 01/22/21 0800 01/22/21 1052  GLUCAP 246* 134* 102* 100* 97   Lipid Profile: No results for input(s): CHOL, HDL, LDLCALC, TRIG, CHOLHDL, LDLDIRECT in the last 72 hours. Thyroid Function Tests: No results for input(s): TSH, T4TOTAL, FREET4, T3FREE, THYROIDAB in the last 72 hours. Anemia Panel: No results for input(s): VITAMINB12, FOLATE, FERRITIN, TIBC, IRON, RETICCTPCT in the last 72 hours. Urine analysis:    Component Value Date/Time   COLORURINE YELLOW 01/20/2021 1700   APPEARANCEUR CLEAR 01/20/2021 1700   LABSPEC 1.010 01/20/2021 1700   PHURINE 8.0 01/20/2021 1700   GLUCOSEU NEGATIVE 01/20/2021 1700  HGBUR TRACE (A) 01/20/2021 1700   BILIRUBINUR NEGATIVE 01/20/2021 1700   KETONESUR NEGATIVE 01/20/2021 1700   PROTEINUR NEGATIVE 01/20/2021 1700   UROBILINOGEN 1.0 08/16/2014 0423   NITRITE NEGATIVE 01/20/2021 1700   LEUKOCYTESUR NEGATIVE 01/20/2021 1700   Sepsis Labs: @LABRCNTIP (procalcitonin:4,lacticacidven:4)  ) Recent Results (from the past 240 hour(s))  Resp Panel by RT-PCR (Flu A&B, Covid) Nasopharyngeal Swab     Status: None   Collection Time: 01/20/21  3:00 PM   Specimen: Nasopharyngeal Swab; Nasopharyngeal(NP) swabs in vial transport medium  Result Value Ref Range Status   SARS Coronavirus 2 by RT PCR NEGATIVE NEGATIVE Final    Comment: (NOTE) SARS-CoV-2 target nucleic acids are NOT DETECTED.  The SARS-CoV-2 RNA is generally detectable in upper respiratory specimens during the acute phase of infection. The lowest concentration of SARS-CoV-2 viral copies this assay can detect is 138 copies/mL. A negative result does not preclude SARS-Cov-2 infection and should not be used as the sole basis for treatment or other patient management decisions. A negative result may occur with  improper specimen  collection/handling, submission of specimen other than nasopharyngeal swab, presence of viral mutation(s) within the areas targeted by this assay, and inadequate number of viral copies(<138 copies/mL). A negative result must be combined with clinical observations, patient history, and epidemiological information. The expected result is Negative.  Fact Sheet for Patients:  BloggerCourse.comhttps://www.fda.gov/media/152166/download  Fact Sheet for Healthcare Providers:  SeriousBroker.ithttps://www.fda.gov/media/152162/download  This test is no t yet approved or cleared by the [place] FDA and  has been authorized for detection and/or diagnosis of SARS-CoV-2 by FDA under an Emergency Use Authorization (EUA). This EUA will remain  in effect (meaning this test can be used) for the duration of the COVID-19 declaration under Section 564(b)(1) of the Act, 21 U.S.C.section 360bbb-3(b)(1), unless the authorization is terminated  or revoked sooner.       Influenza A by PCR NEGATIVE NEGATIVE Final   Influenza B by PCR NEGATIVE NEGATIVE Final    Comment: (NOTE) The Xpert Xpress SARS-CoV-2/FLU/RSV plus assay is intended as an aid in the diagnosis of influenza from Nasopharyngeal swab specimens and should not be used as a sole basis for treatment. Nasal washings and aspirates are unacceptable for Xpert Xpress SARS-CoV-2/FLU/RSV testing.  Fact Sheet for Patients: BloggerCourse.comhttps://www.fda.gov/media/152166/download  Fact Sheet for Healthcare Providers: SeriousBroker.ithttps://www.fda.gov/media/152162/download  This test is not yet approved or cleared by the [place] FDA and has been authorized for detection and/or diagnosis of SARS-CoV-2 by FDA under an Emergency Use Authorization (EUA). This EUA will remain in effect (meaning this test can be used) for the duration of the COVID-19 declaration under Section 564(b)(1) of the Act, 21 U.S.C. section 360bbb-3(b)(1), unless the authorization is terminated or revoked.  Performed at [facility] [place], [address] [place], [place] [address]   Urine culture     Status: Abnormal   Collection Time: 01/20/21  5:00 PM   Specimen: Urine, Random  Result Value Ref Range Status   Specimen Description   Final    URINE, RANDOM Performed at [facility] [place], [address] [place], [place] [address]    Special Requests   Final    NONE Performed at [facility] [place], [address] [place], [place] [address]    Culture MULTIPLE SPECIES PRESENT, SUGGEST RECOLLECTION (A)  Final   Report Status 01/22/2021 FINAL  Final         Radiology Studies: CT Angio Chest PE W/Cm &/Or Wo  Cm  Result Date: 01/20/2021 CLINICAL DATA:  PE suspected, low/intermediate prob, positive D-dimer Vomiting since last night. EXAM: CT ANGIOGRAPHY CHEST WITH CONTRAST TECHNIQUE: Multidetector CT imaging of the chest was performed using the standard protocol during bolus administration of intravenous contrast. Multiplanar CT image reconstructions and MIPs were obtained to evaluate the vascular anatomy. CONTRAST:  OMNIPAQUE IOHEXOL 350 MG/ML SOLN COMPARISON:  Radiograph earlier today.  Chest CTA 11/10/2019 FINDINGS: Cardiovascular: There are no filling defects within the pulmonary arteries to suggest pulmonary embolus. Diffuse aortic atherosclerosis and tortuosity. No aortic aneurysm. Cannot assess for dissection given phase of contrast tailored to pulmonary artery evaluation. There are coronary artery calcifications. Mild cardiomegaly. No pericardial effusion. Mediastinum/Nodes: No enlarged mediastinal or hilar lymph nodes. Decompressed esophagus. Small hiatal hernia. No thyroid nodule. Lungs/Pleura: No acute airspace disease. No pleural effusion. No pulmonary edema. No pulmonary nodule or mass. The trachea and central bronchi are patent. Upper Abdomen: Cyst on concurrent abdominopelvic CT, reported separately. Musculoskeletal: Moderate thoracic scoliosis. 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 There are no acute or suspicious osseous abnormalities. Review of the MIP images confirms the above findings. IMPRESSION: 1. No pulmonary embolus or acute intrathoracic abnormality. 2. Diffuse aortic atherosclerosis and tortuosity. Coronary artery calcifications. Mild cardiomegaly. Aortic Atherosclerosis (ICD10-I70.0). Electronically Signed   By: Narda Rutherford M.D.   On: 01/20/2021 16:51   CT Abdomen Pelvis W Contrast  Result Date: 01/20/2021 CLINICAL DATA:  Nausea and vomiting. EXAM: CT ABDOMEN AND PELVIS WITH CONTRAST TECHNIQUE: Multidetector CT imaging of the abdomen and pelvis was performed using the standard protocol following bolus administration of intravenous contrast. CONTRAST:  OMNIPAQUE IOHEXOL 350 MG/ML SOLN COMPARISON:  Abdominopelvic CT 11/09/2020 FINDINGS: Lower chest: Assessed on concurrent chest CT, reported separately. Hepatobiliary: Previous subcentimeter low-density in the lateral right liver is not well seen on the current exam. Multiple stones in the gallbladder with gallbladder distension and mild wall thickening/pericholecystic edema, for example series 7, image 62. There is intra and extrahepatic biliary ductal dilatation with noncalcified filling defects in the common bile duct, for example series 4, image 30 and 33. Common bile duct measures 10 mm. Pancreas: Parenchymal atrophy. No ductal dilatation or inflammation. Spleen: Normal in size without focal abnormality. Adrenals/Urinary Tract: Normal adrenal glands. 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 There is no hydronephrosis or perinephric edema. Symmetric renal excretion on delayed phase imaging. Stable cyst in the lower left kidney. Unremarkable urinary bladder. Stomach/Bowel: Tiny hiatal hernia. Decompressed stomach. No small bowel obstruction or inflammation normal appendix. Multifocal colonic diverticulosis, most prominently affecting the left colon, without diverticulitis no  colonic inflammation. Vascular/Lymphatic: Advanced aortic atherosclerosis. Advanced aortic branch atherosclerosis. 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 Portal vein is patent. No portal venous or mesenteric gas. Infrarenal IVC filter in place. No enlarged lymph nodes in the abdomen or pelvis. Reproductive: Atrophic uterus, normal for age. Quiescent ovaries. No adnexal mass. Other: No ascites, free air or focal fluid collection. Musculoskeletal: No acute osseous abnormalities are seen. 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 Prominent lumbar facet hypertrophy. IMPRESSION: 1. Cholelithiasis with gallbladder distension and mild wall thickening/pericholecystic edema, suspicious for acute cholecystitis. 2. Intra and extrahepatic biliary ductal dilatation with noncalcified filling defects in the common bile duct, suspicious for choledocholithiasis. 3. Colonic diverticulosis without diverticulitis. Aortic Atherosclerosis (ICD10-I70.0). Electronically Signed   By: Narda Rutherford M.D.   On: 01/20/2021 16:57   US Abdomen Limited RUQ (LIVER/GB)  Result Date: 01/20/2021 CLINICAL DATA:  Right  upper quadrant pain with vomiting EXAM: ULTRASOUND ABDOMEN LIMITED RIGHT UPPER QUADRANT COMPARISON:  CT 01/20/2021 FINDINGS: Gallbladder: Small stones in the gallbladder. Gallbladder is distended. Slight increased wall thickness up to 6 mm but no sonographic Murphy. Common bile duct: Diameter: 5.6 mm Liver: No focal lesion identified. Within normal limits in parenchymal echogenicity. Portal vein is patent on color Doppler imaging with normal direction of blood flow towards the liver. Other: None. IMPRESSION: 1. Distended gallbladder with stones and slight wall thickening but negative sonographic Murphy. Correlation with nuclear medicine hepatobiliary imaging could be obtained if continued concern for acute cholecystitis Electronically Signed   By: Jasmine Pang M.D.   On: 01/20/2021 18:42         Scheduled Meds:  aspirin  81 mg Oral Daily   busPIRone  10 mg Oral BID   furosemide  40 mg Intravenous Once   [START ON 01/23/2021] furosemide  20 mg Oral Daily   gabapentin  300 mg Oral QHS   insulin aspart  0-9 Units Subcutaneous Q4H   predniSONE  30 mg Oral Q breakfast   Continuous Infusions:  cefTRIAXone (ROCEPHIN)  IV 2 g (01/21/21 1805)   metronidazole 500 mg (01/22/21 0450)     LOS: 2 days    Time spent: 25  minutes    Alberteen Sam, MD [facility] 01/22/2021, 2:53 PM     Please page though AMION or Epic secure chat:  For password, contact charge nurse

## 2021-01-22 NOTE — Anesthesia Preprocedure Evaluation (Addendum)
Anesthesia Evaluation  Patient identified by MRN, date of birth, ID band Patient awake    Reviewed: Allergy & Precautions, H&P , NPO status , Patient's Chart, lab work & pertinent test results, reviewed documented beta blocker date and time   History of Anesthesia Complications (+) MALIGNANT HYPERTHERMIA  Airway Mallampati: III  TM Distance: >3 FB Neck ROM: Full    Dental no notable dental hx. (+) Edentulous Upper, Edentulous Lower, Dental Advisory Given   Pulmonary COPD,  COPD inhaler, former smoker,    Pulmonary exam normal breath sounds clear to auscultation       Cardiovascular hypertension, Pt. on medications and Pt. on home beta blockers + dysrhythmias Atrial Fibrillation  Rhythm:Irregular Rate:Normal     Neuro/Psych negative neurological ROS  negative psych ROS   GI/Hepatic negative GI ROS, Neg liver ROS,   Endo/Other  diabetes, Type 2, Oral Hypoglycemic Agents  Renal/GU negative Renal ROS  negative genitourinary   Musculoskeletal   Abdominal   Peds  Hematology negative hematology ROS (+)   Anesthesia Other Findings   Reproductive/Obstetrics negative OB ROS                            Anesthesia Physical Anesthesia Plan  ASA: 3  Anesthesia Plan: General   Post-op Pain Management:    Induction: Intravenous  PONV Risk Score and Plan: 4 or greater and Ondansetron, Dexamethasone and Treatment may vary due to age or medical condition  Airway Management Planned: Oral ETT  Additional Equipment:   Intra-op Plan:   Post-operative Plan: Extubation in OR  Informed Consent: I have reviewed the patients History and Physical, chart, labs and discussed the procedure including the risks, benefits and alternatives for the proposed anesthesia with the patient or authorized representative who has indicated his/her understanding and acceptance.     Dental advisory given  Plan  Discussed with: CRNA  Anesthesia Plan Comments:         Anesthesia Quick Evaluation

## 2021-01-23 ENCOUNTER — Encounter (HOSPITAL_COMMUNITY): Payer: Self-pay | Admitting: Internal Medicine

## 2021-01-23 LAB — CBC
HCT: 40.8 % (ref 36.0–46.0)
Hemoglobin: 13 g/dL (ref 12.0–15.0)
MCH: 28.2 pg (ref 26.0–34.0)
MCHC: 31.9 g/dL (ref 30.0–36.0)
MCV: 88.5 fL (ref 80.0–100.0)
Platelets: 177 10*3/uL (ref 150–400)
RBC: 4.61 MIL/uL (ref 3.87–5.11)
RDW: 15.8 % — ABNORMAL HIGH (ref 11.5–15.5)
WBC: 13 10*3/uL — ABNORMAL HIGH (ref 4.0–10.5)
nRBC: 0.2 % (ref 0.0–0.2)

## 2021-01-23 LAB — COMPREHENSIVE METABOLIC PANEL
ALT: 122 U/L — ABNORMAL HIGH (ref 0–44)
AST: 40 U/L (ref 15–41)
Albumin: 2.6 g/dL — ABNORMAL LOW (ref 3.5–5.0)
Alkaline Phosphatase: 123 U/L (ref 38–126)
Anion gap: 8 (ref 5–15)
BUN: 18 mg/dL (ref 8–23)
CO2: 19 mmol/L — ABNORMAL LOW (ref 22–32)
Calcium: 8.5 mg/dL — ABNORMAL LOW (ref 8.9–10.3)
Chloride: 107 mmol/L (ref 98–111)
Creatinine, Ser: 1 mg/dL (ref 0.44–1.00)
GFR, Estimated: 57 mL/min — ABNORMAL LOW (ref >60)
Glucose, Bld: 218 mg/dL — ABNORMAL HIGH (ref 70–99)
Potassium: 4.5 mmol/L (ref 3.5–5.1)
Sodium: 134 mmol/L — ABNORMAL LOW (ref 135–145)
Total Bilirubin: 0.7 mg/dL (ref 0.3–1.2)
Total Protein: 6.1 g/dL — ABNORMAL LOW (ref 6.5–8.1)

## 2021-01-23 LAB — GLUCOSE, CAPILLARY
Glucose-Capillary: 120 mg/dL — ABNORMAL HIGH (ref 70–99)
Glucose-Capillary: 132 mg/dL — ABNORMAL HIGH (ref 70–99)
Glucose-Capillary: 147 mg/dL — ABNORMAL HIGH (ref 70–99)
Glucose-Capillary: 165 mg/dL — ABNORMAL HIGH (ref 70–99)
Glucose-Capillary: 170 mg/dL — ABNORMAL HIGH (ref 70–99)
Glucose-Capillary: 177 mg/dL — ABNORMAL HIGH (ref 70–99)

## 2021-01-23 MED ORDER — PHENOL 1.4 % MT LIQD
1.0000 | OROMUCOSAL | Status: DC | PRN
  Administered 2021-01-23: 1 via OROMUCOSAL
  Filled 2021-01-23: qty 177

## 2021-01-23 NOTE — Progress Notes (Signed)
1 Day Post-Op   Subjective/Chief Complaint: ERCP cleared several CBD stones WBC mildly elevated Per Dr. Loleta Books - some diuresis, otherwise ready for surgery  Objective: Vital signs in last 24 hours: Temp:  [97.5 F (36.4 C)-98.6 F (37 C)] 97.7 F (36.5 C) (07/10 0428) Pulse Rate:  [74-110] 74 (07/10 0428) Resp:  [18-22] 18 (07/10 0428) BP: (107-162)/(62-96) 147/74 (07/10 0428) SpO2:  [95 %-100 %] 98 % (07/10 0428) Last BM Date: 01/22/21  Intake/Output from previous day: 07/09 0701 - 07/10 0700 In: 1429 [P.O.:480; I.V.:749; IV Piggyback:200] Out: -  Intake/Output this shift: Total I/O In: 240 [P.O.:240] Out: -   WDWN in NAD Abd - soft, non-tender, no masses  Lab Results:  Recent Labs    01/22/21 0518 01/23/21 0922  WBC 12.3* 13.0*  HGB 14.0 13.0  HCT 41.7 40.8  PLT 152 177   BMET Recent Labs    01/22/21 0518 01/23/21 0922  NA 137 134*  K 4.1 4.5  CL 109 107  CO2 20* 19*  GLUCOSE 107* 218*  BUN 16 18  CREATININE 1.03* 1.00  CALCIUM 8.7* 8.5*   Hepatic Function Latest Ref Rng & Units 01/23/2021 01/22/2021 01/21/2021  Total Protein 6.5 - 8.1 g/dL 6.1(L) 6.5 6.4(L)  Albumin 3.5 - 5.0 g/dL 2.6(L) 2.7(L) 3.1(L)  AST 15 - 41 U/L 40 76(H) 247(H)  ALT 0 - 44 U/L 122(H) 183(H) 309(H)  Alk Phosphatase 38 - 126 U/L 123 140(H) 173(H)  Total Bilirubin 0.3 - 1.2 mg/dL 0.7 0.9 3.0(H)  Bilirubin, Direct 0.0 - 0.2 mg/dL - - -    PT/INR Recent Labs    01/21/21 0226 01/22/21 0518  LABPROT 18.9* 18.3*  INR 1.6* 1.5*   ABG No results for input(s): PHART, HCO3 in the last 72 hours.  Invalid input(s): PCO2, PO2  Studies/Results: DG ERCP  Result Date: 01/22/2021 CLINICAL DATA:  Biliary stones EXAM: ERCP TECHNIQUE: Multiple spot images obtained with the fluoroscopic device and submitted for interpretation post-procedure. FLUOROSCOPY TIME:  Fluoroscopy Time:  3 minutes 7 seconds Radiation Exposure Index (if provided by the fluoroscopic device): 53 mGy Number of  Acquired Spot Images: 8 COMPARISON:  None. FINDINGS: Submitted intraoperative fluoroscopic images demonstrate multiple filling defects in the bile ducts the which may be due to stones/debris or air. Balloon noted within the mid common bile duct on 1 of the images. IMPRESSION: Intraoperative fluoroscopic images of ERCP as above. These images were submitted for radiologic interpretation only. Please see the procedural report for the amount of contrast and the fluoroscopy time utilized. Electronically Signed   By: Miachel Roux M.D.   On: 01/22/2021 14:57    Anti-infectives: Anti-infectives (From admission, onward)    Start     Dose/Rate Route Frequency Ordered Stop   01/21/21 1500  cefTRIAXone (ROCEPHIN) 2 g in sodium chloride 0.9 % 100 mL IVPB        2 g 200 mL/hr over 30 Minutes Intravenous Every 24 hours 01/21/21 1413     01/21/21 1500  metroNIDAZOLE (FLAGYL) IVPB 500 mg        500 mg 100 mL/hr over 60 Minutes Intravenous Every 12 hours 01/21/21 1413     01/21/21 0400  piperacillin-tazobactam (ZOSYN) IVPB 3.375 g  Status:  Discontinued        3.375 g 12.5 mL/hr over 240 Minutes Intravenous Every 8 hours 01/21/21 0249 01/21/21 1405   01/20/21 1715  piperacillin-tazobactam (ZOSYN) IVPB 3.375 g        3.375 g 100 mL/hr  over 30 Minutes Intravenous  Once 01/20/21 1711 01/20/21 1754       Assessment/Plan: Choledocholithiasis Cholelithiasis with possible Cholecystitis - GI plans for ERCP today, although T. Bili is now normal. - Continue to hold Coumadin. Last INR 1.5.Will plan for Lap Chole  Monday if cleared by [facility]. - Cont abx - Cont to trend labs    FEN - currently clear liquids, then NPO p MN.   IVF per [facility] VTE - SCDs, Coumadin on hold.  ID - Zosyn 7/7 >> Tmax 102.1. WBC 12   A. Fib on Chronic Coumadin - on hold Hx DVT on Chronic Coumadin - on hold. 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 On  SSI HTN Hx of CHF - BNP > 1000 on admission. Mild cardiomegaly but no pulm edema on CT. On RA. Last Echo I can see in care everywhere from 06/18/18 has EF of 50-55% w/ no wall motion abn COPD Aortic atherosclerosis   Coronary artery calcifications - noted on CT yesterday  LOS: 3 days    Maia Petties 01/23/2021

## 2021-01-23 NOTE — Progress Notes (Signed)
PROGRESS NOTE    Jill Crane  BTD:[id number] DOB: [birth year] DOA: 01/20/2021 PCP: Zoila Shutter, MD      Brief Narrative:  Jill Crane is a 80 y.o. F with AF on warfarin, DM, COPD, Pemphigus on steroids, and hx VTE with IVC filter who presented with recurrent vomiting, Abdominal pain and epigastric/chest pain.    In the ER, AST/ALT >400, Tbili 2.3, WBC 14K.  CT imaging suggested cholecystitis with choledocholithiasis.           Assessment & Plan:  Acute cholecystitis with choledocholithiasis See progress note 7/9 - Continue ceftriaxone and Flagyl - Hold warfarin - Clear liquids - Consult Gen Surg, appreciate expertise      Chronic diastolic CHF Appears euvolemic, clinically compensated. 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        Disposition: Status is: Inpatient  Remains inpatient appropriate because: Presented with cholecystitis, will undergo cholecystectomy then get home  Dispo: The patient is from: Home              Anticipated d/c is to: Home              Patient currently is not medically  stable to d/c.   Difficult to place patient No              MDM: The below labs and imaging reports reviewed and summarized above.  Medication management as above.    DVT prophylaxis: N/A, she is chronically on warfarin, will have 24 hours holidat they restart anticoagualtion post op  Code Status: FULL Family Communication:            Subjective: No abdominal pain, chest pain, orthopnea, dyspnea, leg swelling, palpitations.  A little bit of nausea, some vomiting.        Objective: Vitals:   01/22/21 1410 01/22/21 1615 01/23/21 0428 01/23/21 1248  BP: (!) 155/87 140/75 (!) 147/74 133/64  Pulse: 89 93 74 73  Resp: 20 18 18 18   Temp: (!) 97.5 F (36.4 C) 98.3 F (36.8 C) 97.7 F (36.5 C) 97.7 F (36.5 C)  TempSrc: Oral Oral Oral Oral  SpO2: 97% 98% 98% 99%  Weight:      Height:        Intake/Output Summary (Last 24 hours) at 01/23/2021 1612 Last data filed at 01/23/2021 0800 Gross per 24 hour  Intake 480 ml  Output --  Net 480 ml   Filed Weights   01/20/21 1258  Weight: 76.7 kg    Examination: General appearance: Overweight elderly female, lying in bed, no acute distress, interactive     HEENT: Anicteric, conjunctive and lids and lashes normal, no nasal deformity, discharge, or  epistaxis, oropharynx moist, no oral lesions. Skin: Normal Cardiac: Regular rate and rhythm, JVP normal, no lower extremity edema Respiratory: Respiratory effort normal, lungs clear without rales or wheezes Abdomen: Abdomen soft without tenderness palpation or guarding, no ascites or distention MSK:  Neuro: Awake and alert, extraocular movements intact, moves all extremities with normal strength and coordination, speech fluent Psych: Attention normal, affect blunted, judgment insight appear moderately impaired     Data Reviewed: I have personally reviewed following labs and imaging studies:  CBC: Recent Labs  Lab 01/20/21 1500 01/21/21 0226 01/22/21 0518  01/23/21 0922  WBC 14.4* 12.0* 12.3* 13.0*  NEUTROABS 11.9*  --   --   --   HGB 14.2 14.5 14.0 13.0  HCT 42.1 44.6 41.7 40.8  MCV 86.4 87.3 86.7 88.5  PLT 182 154 152 177   Basic Metabolic Panel: Recent Labs  Lab 01/20/21 1500 01/21/21 0158 01/22/21 0518 01/23/21 0922  NA 134* 136 137 134*  K 4.5 4.1 4.1 4.5  CL 105 105 109 107  CO2 21* 18* 20* 19*  GLUCOSE 142* 67* 107* 218*  BUN 15 18 16 18   CREATININE 1.12* 1.56* 1.03* 1.00  CALCIUM 8.9 9.1 8.7* 8.5*   GFR: Estimated Creatinine Clearance: 46.7 mL/min (by C-G formula based on SCr of 1 mg/dL). Liver Function Tests: Recent Labs  Lab 01/20/21 1500 01/20/21 1850 01/21/21 0158 01/22/21 0518 01/23/21 0922  AST 442*  --  247* 76* 40  ALT 383*  --  309* 183* 122*  ALKPHOS 187*  --  173* 140* 123  BILITOT 2.3* 2.6* 3.0* 0.9 0.7  PROT 7.2  --  6.4* 6.5 6.1*  ALBUMIN 3.3*  --  3.1* 2.7* 2.6*   Recent Labs  Lab 01/20/21 1500  LIPASE 22   No results for input(s): AMMONIA in the last 168 hours. Coagulation Profile: Recent Labs  Lab 01/20/21 1500 01/21/21 0226 01/22/21 0518  INR 1.8* 1.6* 1.5*   Cardiac Enzymes: No results for input(s): CKTOTAL, CKMB, CKMBINDEX, TROPONINI in the last 168 hours. BNP (last 3 results) No results for input(s): PROBNP in the last 8760 hours. HbA1C: Recent Labs    01/21/21 0233  HGBA1C 7.0*   CBG: Recent Labs  Lab 01/23/21 0032 01/23/21 0425 01/23/21 0811 01/23/21 1115 01/23/21 1601  GLUCAP 147* 120* 170* 132* 177*   Lipid Profile: No results for input(s): CHOL, HDL, LDLCALC, TRIG, CHOLHDL, LDLDIRECT in the last 72 hours. Thyroid Function Tests: No results for input(s): TSH, T4TOTAL, FREET4, T3FREE, THYROIDAB in the last 72 hours. Anemia Panel: No results for input(s): VITAMINB12, FOLATE, FERRITIN, TIBC, IRON, RETICCTPCT in the last 72 hours. Urine analysis:    Component Value Date/Time   COLORURINE YELLOW 01/20/2021 1700   APPEARANCEUR CLEAR 01/20/2021 1700    LABSPEC 1.010 01/20/2021 1700   PHURINE 8.0 01/20/2021 1700   GLUCOSEU NEGATIVE 01/20/2021 1700   HGBUR TRACE (A) 01/20/2021 1700   BILIRUBINUR NEGATIVE 01/20/2021 1700   KETONESUR NEGATIVE 01/20/2021 1700   PROTEINUR NEGATIVE 01/20/2021 1700   UROBILINOGEN 1.0 08/16/2014 0423   NITRITE NEGATIVE 01/20/2021 1700   LEUKOCYTESUR NEGATIVE 01/20/2021 1700   Sepsis Labs: @LABRCNTIP (procalcitonin:4,lacticacidven:4)  ) Recent Results (from the past 240 hour(s))  Resp Panel by RT-PCR (Flu A&B, Covid) Nasopharyngeal Swab     Status: None   Collection Time: 01/20/21  3:00 PM   Specimen: Nasopharyngeal Swab; Nasopharyngeal(NP) swabs in vial transport medium  Result Value Ref Range Status   SARS Coronavirus 2 by RT PCR NEGATIVE NEGATIVE Final  Comment: (NOTE) SARS-CoV-2 target nucleic acids are NOT DETECTED.  The SARS-CoV-2 RNA is generally detectable in upper respiratory specimens during the acute phase of infection. The lowest concentration of SARS-CoV-2 viral copies this assay can detect is 138 copies/mL. A negative result does not preclude SARS-Cov-2 infection and should not be used as the sole basis for treatment or other patient management decisions. A negative result may occur with  improper specimen collection/handling, submission of specimen other than nasopharyngeal swab, presence of viral mutation(s) within the areas targeted by this assay, and inadequate number of viral copies(<138 copies/mL). A negative result must be combined with clinical observations, patient history, and epidemiological information. The expected result is Negative.  Fact Sheet for Patients:  BloggerCourse.com  Fact Sheet for Healthcare Providers:  SeriousBroker.it  This test is no t yet approved or cleared by the [place] FDA and  has been authorized for detection and/or diagnosis of SARS-CoV-2 by FDA under an Emergency Use Authorization (EUA).  This EUA will remain  in effect (meaning this test can be used) for the duration of the COVID-19 declaration under Section 564(b)(1) of the Act, 21 U.S.C.section 360bbb-3(b)(1), unless the authorization is terminated  or revoked sooner.       Influenza A by PCR NEGATIVE NEGATIVE Final   Influenza B by PCR NEGATIVE NEGATIVE Final    Comment: (NOTE) The Xpert Xpress SARS-CoV-2/FLU/RSV plus assay is intended as an aid in the diagnosis of influenza from Nasopharyngeal swab specimens and should not be used as a sole basis for treatment. Nasal washings and aspirates are unacceptable for Xpert Xpress SARS-CoV-2/FLU/RSV testing.  Fact Sheet for Patients: BloggerCourse.com  Fact Sheet for Healthcare Providers: SeriousBroker.it  This test is not yet approved or cleared by the [place] FDA and has been authorized for detection and/or diagnosis of SARS-CoV-2 by FDA under an Emergency Use Authorization (EUA). This EUA will remain in effect (meaning this test can be used) for the duration of the COVID-19 declaration under Section 564(b)(1) of the Act, 21 U.S.C. section 360bbb-3(b)(1), unless the authorization is terminated or revoked.  Performed at [facility], [address] [place], [place] [address]   Urine culture     Status: Abnormal   Collection Time: 01/20/21  5:00 PM   Specimen: Urine, Random  Result Value Ref Range Status   Specimen Description   Final    URINE, RANDOM Performed at [facility], [address] [place], [place] [address]    Special Requests   Final    NONE Performed at [facility], [address] [place], [place] [address]    Culture MULTIPLE SPECIES PRESENT, SUGGEST RECOLLECTION (A)  Final   Report Status 01/22/2021 FINAL  Final         Radiology Studies: DG ERCP  Result Date: 01/22/2021 CLINICAL DATA:  Biliary stones EXAM: ERCP TECHNIQUE: Multiple spot  images obtained with the fluoroscopic device and submitted for interpretation post-procedure. FLUOROSCOPY TIME:  Fluoroscopy Time:  3 minutes 7 seconds Radiation Exposure Index (if provided by the fluoroscopic device): 53 mGy Number of Acquired Spot Images: 8 COMPARISON:  None. FINDINGS: Submitted intraoperative fluoroscopic images demonstrate multiple filling defects in the bile ducts the which may be due to stones/debris or air. Balloon noted within the mid common bile duct on 1 of the images. IMPRESSION: Intraoperative fluoroscopic images of ERCP as above. These images were submitted for radiologic interpretation only. Please see the procedural report for the amount of  contrast and the fluoroscopy time utilized. Electronically Signed   By: Acquanetta BellingFarhaan  Mir M.D.   On: 01/22/2021 14:57        Scheduled Meds:  busPIRone  10 mg Oral BID   furosemide  20 mg Oral Daily   gabapentin  300 mg Oral QHS   insulin aspart  0-9 Units Subcutaneous Q4H   metoprolol tartrate  25 mg Oral BID   predniSONE  30 mg Oral Q breakfast   Continuous Infusions:  cefTRIAXone (ROCEPHIN)  IV 2 g (01/23/21 1524)   metronidazole 500 mg (01/23/21 0256)     LOS: 3 days    Time spent: 25  minutes    Alberteen Samhristopher P Jonanthony Nahar, MD [facility] 01/23/2021, 4:12 PM     Please page though AMION or Epic secure chat:  For password, contact charge nurse

## 2021-01-23 NOTE — Plan of Care (Signed)
  Problem: Education: Goal: Knowledge of General Education information will improve Description Including pain rating scale, medication(s)/side effects and non-pharmacologic comfort measures Outcome: Progressing   

## 2021-01-24 ENCOUNTER — Encounter (HOSPITAL_COMMUNITY): Payer: Self-pay | Admitting: Anesthesiology

## 2021-01-24 LAB — BPAM FFP
Blood Product Expiration Date: 202207142359
Blood Product Expiration Date: 202207142359
ISSUE DATE / TIME: 202207102212
ISSUE DATE / TIME: 202207102212
Unit Type and Rh: 7300
Unit Type and Rh: 7300

## 2021-01-24 LAB — GLUCOSE, CAPILLARY
Glucose-Capillary: 105 mg/dL — ABNORMAL HIGH (ref 70–99)
Glucose-Capillary: 75 mg/dL (ref 70–99)
Glucose-Capillary: 76 mg/dL (ref 70–99)
Glucose-Capillary: 77 mg/dL (ref 70–99)

## 2021-01-24 LAB — CBC
HCT: 42.1 % (ref 36.0–46.0)
Hemoglobin: 13.7 g/dL (ref 12.0–15.0)
MCH: 28.6 pg (ref 26.0–34.0)
MCHC: 32.5 g/dL (ref 30.0–36.0)
MCV: 87.9 fL (ref 80.0–100.0)
Platelets: 182 10*3/uL (ref 150–400)
RBC: 4.79 MIL/uL (ref 3.87–5.11)
RDW: 15.7 % — ABNORMAL HIGH (ref 11.5–15.5)
WBC: 11.4 10*3/uL — ABNORMAL HIGH (ref 4.0–10.5)
nRBC: 0.2 % (ref 0.0–0.2)

## 2021-01-24 LAB — COMPREHENSIVE METABOLIC PANEL
ALT: 99 U/L — ABNORMAL HIGH (ref 0–44)
AST: 34 U/L (ref 15–41)
Albumin: 2.6 g/dL — ABNORMAL LOW (ref 3.5–5.0)
Alkaline Phosphatase: 110 U/L (ref 38–126)
Anion gap: 9 (ref 5–15)
BUN: 17 mg/dL (ref 8–23)
CO2: 19 mmol/L — ABNORMAL LOW (ref 22–32)
Calcium: 8.9 mg/dL (ref 8.9–10.3)
Chloride: 110 mmol/L (ref 98–111)
Creatinine, Ser: 0.97 mg/dL (ref 0.44–1.00)
GFR, Estimated: 59 mL/min — ABNORMAL LOW (ref >60)
Glucose, Bld: 82 mg/dL (ref 70–99)
Potassium: 4.2 mmol/L (ref 3.5–5.1)
Sodium: 138 mmol/L (ref 135–145)
Total Bilirubin: 0.8 mg/dL (ref 0.3–1.2)
Total Protein: 6 g/dL — ABNORMAL LOW (ref 6.5–8.1)

## 2021-01-24 LAB — PROTIME-INR
INR: 1.4 — ABNORMAL HIGH (ref 0.8–1.2)
Prothrombin Time: 17 seconds — ABNORMAL HIGH (ref 11.4–15.2)

## 2021-01-24 LAB — PREPARE FRESH FROZEN PLASMA
Unit division: 0
Unit division: 0

## 2021-01-24 LAB — MRSA NEXT GEN BY PCR, NASAL: MRSA by PCR Next Gen: DETECTED — AB

## 2021-01-24 SURGERY — LAPAROSCOPIC CHOLECYSTECTOMY WITH INTRAOPERATIVE CHOLANGIOGRAM
Anesthesia: Choice

## 2021-01-24 MED ORDER — AMOXICILLIN-POT CLAVULANATE 875-125 MG PO TABS: 1.0000 | ORAL_TABLET | Freq: Two times a day (BID) | ORAL | 0 refills | Status: AC

## 2021-01-24 MED ORDER — CHLORHEXIDINE GLUCONATE CLOTH 2 % EX PADS
6.0000 | MEDICATED_PAD | Freq: Every day | CUTANEOUS | Status: DC
  Administered 2021-01-24: 6 via TOPICAL

## 2021-01-24 MED ORDER — ENOXAPARIN SODIUM 80 MG/0.8ML IJ SOSY: 70.0000 mg | PREFILLED_SYRINGE | Freq: Two times a day (BID) | INTRAMUSCULAR | 0 refills | Status: DC

## 2021-01-24 MED ORDER — ONDANSETRON HCL 4 MG/2ML IJ SOLN
INTRAMUSCULAR | Status: AC
  Filled 2021-01-24: qty 2

## 2021-01-24 MED ORDER — DEXAMETHASONE SODIUM PHOSPHATE 10 MG/ML IJ SOLN
INTRAMUSCULAR | Status: AC
  Filled 2021-01-24: qty 1

## 2021-01-24 MED ORDER — LIDOCAINE 2% (20 MG/ML) 5 ML SYRINGE
INTRAMUSCULAR | Status: AC
  Filled 2021-01-24: qty 5

## 2021-01-24 MED ORDER — ROCURONIUM BROMIDE 10 MG/ML (PF) SYRINGE
PREFILLED_SYRINGE | INTRAVENOUS | Status: AC
  Filled 2021-01-24: qty 10

## 2021-01-24 MED ORDER — PROPOFOL 10 MG/ML IV BOLUS
INTRAVENOUS | Status: AC
  Filled 2021-01-24: qty 20

## 2021-01-24 MED ORDER — PHENYLEPHRINE 40 MCG/ML (10ML) SYRINGE FOR IV PUSH (FOR BLOOD PRESSURE SUPPORT)
PREFILLED_SYRINGE | INTRAVENOUS | Status: AC
  Filled 2021-01-24: qty 10

## 2021-01-24 MED ORDER — MUPIROCIN 2 % EX OINT
1.0000 "application " | TOPICAL_OINTMENT | Freq: Two times a day (BID) | CUTANEOUS | Status: DC
  Administered 2021-01-24: 1 via NASAL
  Filled 2021-01-24: qty 22

## 2021-01-24 NOTE — Progress Notes (Signed)
AVS carefully reviewed with patient and daughter. Both parties voice understand to follow instruction with medications and office visits. No other questions or concern raise at this time. Patient to disposition with daughter at this time.

## 2021-01-24 NOTE — Progress Notes (Signed)
Progress Note  2 Days Post-Op  Subjective: CC: she is very anxious to have her surgery today. She is very hungry. She denies any RUQ abdominal pain, nausea, or emesis.   Objective: Vital signs in last 24 hours: Temp:  [97.7 F (36.5 C)-98.3 F (36.8 C)] 98.3 F (36.8 C) (07/11 0425) Pulse Rate:  [60-80] 60 (07/11 0425) Resp:  [18-19] 18 (07/11 0425) BP: (133-152)/(64-88) 151/88 (07/11 0425) SpO2:  [97 %-100 %] 100 % (07/11 0425) Last BM Date: 01/22/21  Intake/Output from previous day: 07/10 0701 - 07/11 0700 In: 920 [P.O.:720; IV Piggyback:200] Out: -  Intake/Output this shift: No intake/output data recorded.  PE: General: pleasant, WD, female who is laying in bed in NAD HEENT: head is normocephalic, atraumatic. Mouth is pink and moist Heart: Palpable radial pulses bilaterally Lungs: Respiratory effort nonlabored Abd: soft, NT, ND, +BS, no masses, hernias, or organomegaly MS: all 4 extremities are symmetrical with no cyanosis, clubbing, or edema. Skin: warm and dry with no masses, lesions, or rashes Psych: A&Ox3 with an appropriate affect.    Lab Results:  Recent Labs    01/23/21 0922 01/24/21 0502  WBC 13.0* 11.4*  HGB 13.0 13.7  HCT 40.8 42.1  PLT 177 182   BMET Recent Labs    01/23/21 0922 01/24/21 0502  NA 134* 138  K 4.5 4.2  CL 107 110  CO2 19* 19*  GLUCOSE 218* 82  BUN 18 17  CREATININE 1.00 0.97  CALCIUM 8.5* 8.9   PT/INR Recent Labs    01/22/21 0518 01/24/21 0502  LABPROT 18.3* 17.0*  INR 1.5* 1.4*   CMP     Component Value Date/Time   NA 138 01/24/2021 0502   K 4.2 01/24/2021 0502   CL 110 01/24/2021 0502   CO2 19 (L) 01/24/2021 0502   GLUCOSE 82 01/24/2021 0502   BUN 17 01/24/2021 0502   CREATININE 0.97 01/24/2021 0502   CALCIUM 8.9 01/24/2021 0502   PROT 6.0 (L) 01/24/2021 0502   ALBUMIN 2.6 (L) 01/24/2021 0502   AST 34 01/24/2021 0502   ALT 99 (H) 01/24/2021 0502   ALKPHOS 110 01/24/2021 0502   BILITOT 0.8  01/24/2021 0502   GFRNONAA 59 (L) 01/24/2021 0502   GFRAA >60 11/11/2019 0518   Lipase     Component Value Date/Time   LIPASE 22 01/20/2021 1500       Studies/Results: DG ERCP  Result Date: 01/22/2021 CLINICAL DATA:  Biliary stones EXAM: ERCP TECHNIQUE: Multiple spot images obtained with the fluoroscopic device and submitted for interpretation post-procedure. FLUOROSCOPY TIME:  Fluoroscopy Time:  3 minutes 7 seconds Radiation Exposure Index (if provided by the fluoroscopic device): 53 mGy Number of Acquired Spot Images: 8 COMPARISON:  None. FINDINGS: Submitted intraoperative fluoroscopic images demonstrate multiple filling defects in the bile ducts the which may be due to stones/debris or air. Balloon noted within the mid common bile duct on 1 of the images. IMPRESSION: Intraoperative fluoroscopic images of ERCP as above. These images were submitted for radiologic interpretation only. Please see the procedural report for the amount of contrast and the fluoroscopy time utilized. Electronically Signed   By: Acquanetta Belling M.D.   On: 01/22/2021 14:57    Anti-infectives: Anti-infectives (From admission, onward)    Start     Dose/Rate Route Frequency Ordered Stop   01/21/21 1500  cefTRIAXone (ROCEPHIN) 2 g in sodium chloride 0.9 % 100 mL IVPB        2 g 200 mL/hr over 30  Minutes Intravenous Every 24 hours 01/21/21 1413     01/21/21 1500  metroNIDAZOLE (FLAGYL) IVPB 500 mg        500 mg 100 mL/hr over 60 Minutes Intravenous Every 12 hours 01/21/21 1413     01/21/21 0400  piperacillin-tazobactam (ZOSYN) IVPB 3.375 g  Status:  Discontinued        3.375 g 12.5 mL/hr over 240 Minutes Intravenous Every 8 hours 01/21/21 0249 01/21/21 1405   01/20/21 1715  piperacillin-tazobactam (ZOSYN) IVPB 3.375 g        3.375 g 100 mL/hr over 30 Minutes Intravenous  Once 01/20/21 1711 01/20/21 1754        Assessment/Plan  Choledocholithiasis Cholelithiasis with possible Cholecystitis - s/p ERCP 7/9 -  Continue to hold Coumadin -  INR 1.4. - tentatively plan for Lap Chole today - Cont abx  - Cont to trend labs - LFT improved and leukocytosis improving - on chronic steroids   FEN - NPO. IVF per TRH VTE - SCDs, Coumadin on hold. ID - Zosyn 7/7 >> Tmax 102.1. WBC 12   A. Fib on Chronic Coumadin - on hold Hx DVT on Chronic Coumadin - on hold. 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 On SSI HTN Hx of CHF - BNP > 1000 on admission. Mild cardiomegaly but no pulm edema on CT. On RA. Last Echo I can see in care everywhere from 06/18/18 has EF of 50-55% w/ no wall motion abn COPD Aortic atherosclerosis   Coronary artery calcifications - noted on CT 7/7    LOS: 4 days    Eric Form, [facility] Surgery 01/24/2021, 10:02 AM Please see Amion for pager number during day hours 7:00am-4:30pm

## 2021-01-24 NOTE — Progress Notes (Addendum)
Report given to [facility] in pre-op. Patient/daughter updated POC. Per pt and daughter, they do not wish to move forward with procedure and wish to leave AMA. Attending MD and surgical team aware.  Update: Attending MD at bedside.

## 2021-01-24 NOTE — Discharge Summary (Signed)
70Physician Discharge Summary  [name] JXB:[id number] DOB: Apr 20, 1941 DOA: 01/20/2021  PCP: Zoila Shutter, MD  Admit date: 01/20/2021 Discharge date: 01/24/2021  Admitted From: Home  Disposition:  Home   Recommendations for Outpatient Follow-up:  Follow up with PCP Dr. Coralee Pesa in 1 week Follow up with General Surgery as needed Go to Coumadin clinic this week       Home Health: None   Equipment/Devices: None new  Discharge Condition: Uncertain  CODE STATUS: FULL Diet recommendation: Low fat  Brief/Interim Summary: Jill Crane is a 80 y.o. F with AF on warfarin, DM, COPD, Pemphigus on steroids, and hx VTE with IVC filter who presented with recurrent vomiting, Abdominal pain and epigastric/chest pain.     In the ER, AST/ALT >400, Tbili 2.3, WBC 14K.  CT imaging suggested cholecystitis with choledocholithiasis.     PRINCIPAL HOSPITAL DIAGNOSIS: Acute calculous cholecystitis    Discharge Diagnoses:   Acute cholecystitis with choledocholithiasis The patient was admitted and started on antibiotics.  Warfarin was held.  General surgery and gastroenterology were consulted.  Gastroenterology performed ERCP with sphincterotomy and stone removal on 7/9.  There were plans for cholecystectomy-11, but the patient's daughter and the patient declined surgery.  They discussed potential benefits and harms with Dr. Dwain Sarna, and elected to leave.  I explained that it was against my medical advice to leave without cholecystectomy in this setting, and that I was uncertain the outcome, including worsening condition and organ failure if they chose to leave.  Patient seemed limited in her health literacy but daughter was quite astute and expressed understanding.  They were given return precautions, a short course of antibiotics and discharged.        Chronic diastolic CHF This was clinically well-compensated throughout her stay.    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  The patient was provided with Lovenox for bridging and instructions to be seen by her Coumadin clinic this week.    Diabetes with polyneuropathy, type 2   Anxiety   Hypertension   Elevated BNP, elevated troponin No suspicion for active CHF flare nor ACS.   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          Discharge Instructions  Discharge Instructions     Diet - low sodium heart healthy   Complete by: As directed    Discharge instructions   Complete by: As directed    From Dr. Maryfrances Bunnell: You were admitted for cholecystitis (an inflamed or infected gallbladder).  You had a procedure to remove the stones that were blocking it. I do not recommend that you leave the hospital without further treatment, including surgery to remove the gallbladder. I have attached the contact information for the surgeons' office, in case you wish to follow up with them  Eat a low fat diet Avoid fatty foods Take the antibiotic amoxicillin-clavulanate 875-125 mg twice daily for 5 more days   Resume all your other home medicines, including your warfarin Go see your Coumadin clinic THIS week  Because restarting warfarin can slightly increase the risk of a clot in the first week (before the INR reaches the range 2-3 that you want), I have given you a blood thinner medicine Lovenox to cover you   Take enoxaparin/Lovenox 70 mg twice daily (once in morning and once  at night) for 7 days or until your Coumadin clinic tells you your INR is greater than 2 and you can stop   Increase activity slowly   Complete by: As directed    Increase activity slowly   Complete by: As directed       Allergies as of 01/24/2021   No Known Allergies      Medication List     TAKE these medications    albuterol 108 (90 Base)  MCG/ACT inhaler Commonly known as: VENTOLIN HFA Inhale 2 puffs into the lungs every 6 (six) hours as needed for wheezing or shortness of breath.   alendronate 35 MG tablet Commonly known as: FOSAMAX Take 35 mg by mouth every Monday. Take with a full glass of water on an empty stomach.   amoxicillin-clavulanate 875-125 MG tablet Commonly known as: Augmentin Take 1 tablet by mouth 2 (two) times daily for 5 days.   aspirin 81 MG chewable tablet Chew 81 mg by mouth daily.   brimonidine 0.2 % ophthalmic solution Commonly known as: ALPHAGAN Place 1 drop into the left eye in the morning and at bedtime.   busPIRone 10 MG tablet Commonly known as: BUSPAR Take 10 mg by mouth 2 (two) times daily.   calcium-vitamin D 500-200 MG-UNIT tablet Commonly known as: OSCAL WITH D Take 1 tablet by mouth.   dorzolamide 2 % ophthalmic solution Commonly known as: TRUSOPT Place 1 drop into both eyes at bedtime.   enoxaparin 80 MG/0.8ML injection Commonly known as: LOVENOX Inject 0.7 mLs (70 mg total) into the skin 2 (two) times daily for 8 days.   furosemide 20 MG tablet Commonly known as: LASIX Take 40 mg (2 tablets) tomorrow and the day after, then continue with 20 mg daily What changed:  how much to take how to take this when to take this additional instructions   gabapentin 300 MG capsule Commonly known as: NEURONTIN Take 300 mg by mouth at bedtime.   magnesium oxide 400 (241.3 Mg) MG tablet Commonly known as: MAG-OX Take 400 mg by mouth daily.   Matzim LA 240 MG 24 hr tablet Generic drug: diltiazem Take 240 mg by mouth daily.   metFORMIN 500 MG tablet Commonly known as: GLUCOPHAGE Take 500-1,000 mg by mouth See admin instructions. Take 2 tablets with morning meal, and 1 tablet with evening meals   metoprolol tartrate 25 MG tablet Commonly known as: LOPRESSOR Take 1 tablet (25 mg total) by mouth 2 (two) times daily.   omeprazole 20 MG capsule Commonly known as:  PRILOSEC Take 20 mg by mouth daily.   polyethylene glycol powder 17 GM/SCOOP powder Commonly known as: GLYCOLAX/MIRALAX Take 17 g by mouth daily as needed for constipation.   predniSONE 2.5 MG tablet Commonly known as: DELTASONE Take 10 mg by mouth daily with breakfast. What changed: Another medication with the same name was removed. Continue taking this medication, and follow the directions you see here.   silver sulfADIAZINE 1 % cream Commonly known as: SILVADENE Apply 1 application topically daily as needed for irritation. to scalp   warfarin 2 MG tablet Commonly known as: COUMADIN Take 2-4 mg by mouth See admin instructions. Take 1 tablet (2 mg) daily except Take 2 tablets (4 mg) on (Tues & Fri)        No Known Allergies  Consultations: Gastroenterology General Surgery   Procedures/Studies: CT Angio Chest PE W/Cm &/Or Wo Cm  Result Date: 01/20/2021 CLINICAL DATA:  PE suspected, low/intermediate prob, positive D-dimer Vomiting  since last night. EXAM: CT ANGIOGRAPHY CHEST WITH CONTRAST TECHNIQUE: Multidetector CT imaging of the chest was performed using the standard protocol during bolus administration of intravenous contrast. Multiplanar CT image reconstructions and MIPs were obtained to evaluate the vascular anatomy. CONTRAST:  OMNIPAQUE IOHEXOL 350 MG/ML SOLN COMPARISON:  Radiograph earlier today.  Chest CTA 11/10/2019 FINDINGS: Cardiovascular: There are no filling defects within the pulmonary arteries to suggest pulmonary embolus. Diffuse aortic atherosclerosis and tortuosity. No aortic aneurysm. Cannot assess for dissection given phase of contrast tailored to pulmonary artery evaluation. There are coronary artery calcifications. Mild cardiomegaly. No pericardial effusion. Mediastinum/Nodes: No enlarged mediastinal or hilar lymph nodes. Decompressed esophagus. Small hiatal hernia. No thyroid nodule. Lungs/Pleura: No acute airspace disease. No pleural effusion. No  pulmonary edema. No pulmonary nodule or mass. The trachea and central bronchi are patent. Upper Abdomen: Cyst on concurrent abdominopelvic CT, reported separately. Musculoskeletal: Moderate thoracic scoliosis. 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 There are no acute or suspicious osseous abnormalities. Review of the MIP images confirms the above findings. IMPRESSION: 1. No pulmonary embolus or acute intrathoracic abnormality. 2. Diffuse aortic atherosclerosis and tortuosity. Coronary artery calcifications. Mild cardiomegaly. Aortic Atherosclerosis (ICD10-I70.0). Electronically Signed   By: Narda Rutherford M.D.   On: 01/20/2021 16:51   CT Abdomen Pelvis W Contrast  Result Date: 01/20/2021 CLINICAL DATA:  Nausea and vomiting. EXAM: CT ABDOMEN AND PELVIS WITH CONTRAST TECHNIQUE: Multidetector CT imaging of the abdomen and pelvis was performed using the standard protocol following bolus administration of intravenous contrast. CONTRAST:  OMNIPAQUE IOHEXOL 350 MG/ML SOLN COMPARISON:  Abdominopelvic CT 11/09/2020 FINDINGS: Lower chest: Assessed on concurrent chest CT, reported separately. Hepatobiliary: Previous subcentimeter low-density in the lateral right liver is not well seen on the current exam. Multiple stones in the gallbladder with gallbladder distension and mild wall thickening/pericholecystic edema, for example series 7, image 62. There is intra and extrahepatic biliary ductal dilatation with noncalcified filling defects in the common bile duct, for example series 4, image 30 and 33. Common bile duct measures 10 mm. Pancreas: Parenchymal atrophy. No ductal dilatation or inflammation. Spleen: Normal in size without focal abnormality. Adrenals/Urinary Tract: Normal adrenal glands. 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 There is no hydronephrosis or perinephric edema. Symmetric renal excretion on delayed phase imaging. Stable cyst in the lower left kidney.  Unremarkable urinary bladder. Stomach/Bowel: Tiny hiatal hernia. Decompressed stomach. No small bowel obstruction or inflammation normal appendix. Multifocal colonic diverticulosis, most prominently affecting the left colon, without diverticulitis no colonic inflammation. Vascular/Lymphatic: Advanced aortic atherosclerosis. Advanced aortic branch atherosclerosis. 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 Portal vein is patent. No portal venous or mesenteric gas. Infrarenal IVC filter in place. No enlarged lymph nodes in the abdomen or pelvis. Reproductive: Atrophic uterus, normal for age. Quiescent ovaries. No adnexal mass. Other: No ascites, free air or focal fluid collection. Musculoskeletal: No acute osseous abnormalities are seen. 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 Prominent lumbar facet hypertrophy. IMPRESSION: 1. Cholelithiasis with gallbladder distension and mild wall thickening/pericholecystic edema, suspicious for acute cholecystitis. 2. Intra and extrahepatic biliary ductal dilatation with noncalcified filling defects in the common bile duct, suspicious for choledocholithiasis. 3. Colonic diverticulosis without diverticulitis. Aortic Atherosclerosis (ICD10-I70.0). Electronically Signed   By: Narda Rutherford M.D.   On: 01/20/2021 16:57   US Venous Img Lower  Left (DVT Study)  Result Date: 12/30/2020 CLINICAL DATA:  Left foot pain EXAM: Left LOWER EXTREMITY VENOUS DOPPLER ULTRASOUND TECHNIQUE: Gray-scale sonography  with compression, as well as color and duplex ultrasound, were performed to evaluate the deep venous system(s) from the level of the common femoral vein through the popliteal and proximal calf veins. COMPARISON:  Radiograph 12/30/2020, ultrasound 11/10/2019, CT 11/10/2019 FINDINGS: VENOUS Normal compressibility of the common femoral, superficial femoral, and popliteal veins, as well as the visualized calf veins. Peroneal vein not visualized. Venous  varicosities in the left lower extremity. Visualized portions of profunda femoral vein and great saphenous vein unremarkable. No filling defects to suggest DVT on grayscale or color Doppler imaging. Doppler waveforms show normal direction of venous flow, normal respiratory plasticity and response to augmentation. Limited views of the contralateral common femoral vein are unremarkable. OTHER Sonographer acquired additional images of arterial system of the left lower extremity which demonstrate probable peripheral vascular disease. There appears to be occlusion of the proximal SFA, this is likely present on CT from 2021. Limitations: none IMPRESSION: 1. Negative for acute left lower extremity DVT. Venous varicosities. 2. 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 Electronically Signed   By: Jasmine Pang M.D.   On: 12/30/2020 18:38   DG Chest Portable 1 View  Result Date: 01/20/2021 CLINICAL DATA:  Cough shortness of breath, LEFT-sided pain since last night. EXAM: PORTABLE CHEST 1 VIEW COMPARISON:  August 15, 2020. FINDINGS: EKG leads project over the chest. Cardiomediastinal contours show persistent cardiac enlargement and fullness of central pulmonary vasculature. No frank edema. No consolidation. No pleural effusion on frontal radiograph. On limited assessment no acute skeletal process. IMPRESSION: Cardiomegaly and central vascular congestion without frank edema. Similar to prior chest x-ray. Electronically Signed   By: Donzetta Kohut M.D.   On: 01/20/2021 15:16   DG ERCP  Result Date: 01/22/2021 CLINICAL DATA:  Biliary stones EXAM: ERCP TECHNIQUE: Multiple spot images obtained with the fluoroscopic device and submitted for interpretation post-procedure. FLUOROSCOPY TIME:  Fluoroscopy Time:  3 minutes 7 seconds Radiation Exposure Index (if provided by the fluoroscopic device): 53 mGy Number of Acquired Spot Images: 8 COMPARISON:  None.  FINDINGS: Submitted intraoperative fluoroscopic images demonstrate multiple filling defects in the bile ducts the which may be due to stones/debris or air. Balloon noted within the mid common bile duct on 1 of the images. IMPRESSION: Intraoperative fluoroscopic images of ERCP as above. These images were submitted for radiologic interpretation only. Please see the procedural report for the amount of contrast and the fluoroscopy time utilized. Electronically Signed   By: Acquanetta Belling M.D.   On: 01/22/2021 14:57   DG Foot Complete Left  Result Date: 12/30/2020 CLINICAL DATA:  Left heel pain EXAM: LEFT FOOT - COMPLETE 3+ VIEW COMPARISON:  None. FINDINGS: No fracture or malalignment. Moderate plantar calcaneal spur and mild plantar soft tissue calcification. Posterior calcaneal enthesophyte. IMPRESSION: 1. No acute osseous abnormality 2. Moderate plantar calcaneal spurring Electronically Signed   By: Jasmine Pang M.D.   On: 12/30/2020 17:22   US Abdomen Limited RUQ (LIVER/GB)  Result Date: 01/20/2021 CLINICAL DATA:  Right upper quadrant pain with vomiting EXAM: ULTRASOUND ABDOMEN LIMITED RIGHT UPPER QUADRANT COMPARISON:  CT 01/20/2021 FINDINGS: Gallbladder: Small stones in the gallbladder. Gallbladder is distended. Slight increased wall thickness up to 6 mm but no sonographic Murphy. Common bile duct: Diameter: 5.6 mm Liver: No focal lesion identified. Within normal limits in parenchymal echogenicity. Portal vein is patent on color Doppler imaging with normal direction of blood flow towards the liver. Other: None. IMPRESSION: 1. Distended gallbladder with stones  and slight wall thickening but negative sonographic Murphy. Correlation with nuclear medicine hepatobiliary imaging could be obtained if continued concern for acute cholecystitis Electronically Signed   By: Jasmine Pang M.D.   On: 01/20/2021 18:42      Subjective: Patient eager to leave.  Denies complaints.  Discharge Exam: Vitals:   01/24/21  0425 01/24/21 1226  BP: (!) 151/88 (!) 145/76  Pulse: 60 83  Resp: 18 16  Temp: 98.3 F (36.8 C) 98.2 F (36.8 C)  SpO2: 100% 96%   Vitals:   01/23/21 1749 01/24/21 0010 01/24/21 0425 01/24/21 1226  BP: (!) 152/74 (!) 151/87 (!) 151/88 (!) 145/76  Pulse: 80 66 60 83  Resp: 19 18 18 16   Temp: 98 F (36.7 C) 98 F (36.7 C) 98.3 F (36.8 C) 98.2 F (36.8 C)  TempSrc: Oral Oral Oral Oral  SpO2: 98% 97% 100% 96%  Weight:      Height:        General: Pt is alert, awake, not in acute distress Cardiovascular: RRR, nl S1-S2, no murmurs appreciated.   No LE edema.   Respiratory: Normal respiratory rate and rhythm.  CTAB without rales or wheezes. Abdominal: Abdomen soft and non-tender.  No distension or HSM.   Neuro/Psych: Strength symmetric in upper and lower extremities.  Judgment and insight appear normal.   The results of significant diagnostics from this hospitalization (including imaging, microbiology, ancillary and laboratory) are listed below for reference.     Microbiology: Recent Results (from the past 240 hour(s))  Resp Panel by RT-PCR (Flu A&B, Covid) Nasopharyngeal Swab     Status: None   Collection Time: 01/20/21  3:00 PM   Specimen: Nasopharyngeal Swab; Nasopharyngeal(NP) swabs in vial transport medium  Result Value Ref Range Status   SARS Coronavirus 2 by RT PCR NEGATIVE NEGATIVE Final    Comment: (NOTE) SARS-CoV-2 target nucleic acids are NOT DETECTED.  The SARS-CoV-2 RNA is generally detectable in upper respiratory specimens during the acute phase of infection. The lowest concentration of SARS-CoV-2 viral copies this assay can detect is 138 copies/mL. A negative result does not preclude SARS-Cov-2 infection and should not be used as the sole basis for treatment or other patient management decisions. A negative result may occur with  improper specimen collection/handling, submission of specimen other than nasopharyngeal swab, presence of viral mutation(s)  within the areas targeted by this assay, and inadequate number of viral copies(<138 copies/mL). A negative result must be combined with clinical observations, patient history, and epidemiological information. The expected result is Negative.  Fact Sheet for Patients:  03/23/21  Fact Sheet for Healthcare Providers:  BloggerCourse.com  This test is no t yet approved or cleared by the [place] FDA and  has been authorized for detection and/or diagnosis of SARS-CoV-2 by FDA under an Emergency Use Authorization (EUA). This EUA will remain  in effect (meaning this test can be used) for the duration of the COVID-19 declaration under Section 564(b)(1) of the Act, 21 U.S.C.section 360bbb-3(b)(1), unless the authorization is terminated  or revoked sooner.       Influenza A by PCR NEGATIVE NEGATIVE Final   Influenza B by PCR NEGATIVE NEGATIVE Final    Comment: (NOTE) The Xpert Xpress SARS-CoV-2/FLU/RSV plus assay is intended as an aid in the diagnosis of influenza from Nasopharyngeal swab specimens and should not be used as a sole basis for treatment. Nasal washings and aspirates are unacceptable for Xpert Xpress SARS-CoV-2/FLU/RSV testing.  Fact Sheet for Patients: [place]  Fact Sheet for Healthcare Providers: [employer]ker.it  This test is not yet approved or cleared by the [place] FDA and has been authorized for detection and/or diagnosis of SARS-CoV-2 by FDA under an Emergency Use Authorization (EUA). This EUA will remain in effect (meaning this test can be used) for the duration of the COVID-19 declaration under Section 564(b)(1) of the Act, 21 U.S.C. section 360bbb-3(b)(1), unless the authorization is terminated or revoked.  Performed at [facility], [address] [place], [place] [address]   Urine culture     Status: Abnormal    Collection Time: 01/20/21  5:00 PM   Specimen: Urine, Random  Result Value Ref Range Status   Specimen Description   Final    URINE, RANDOM Performed at [facility], [address] [place], [place] [address]    Special Requests   Final    NONE Performed at [facility], [address] [place], [place] [address]    Culture MULTIPLE SPECIES PRESENT, SUGGEST RECOLLECTION (A)  Final   Report Status 01/22/2021 FINAL  Final  MRSA Next Gen by PCR, Nasal     Status: Abnormal   Collection Time: 01/23/21 11:15 PM   Specimen: Nasal Mucosa; Nasal Swab  Result Value Ref Range Status   MRSA by PCR Next Gen DETECTED (A) NOT DETECTED Final    Comment: RESULT CALLED TO, READ BACK BY AND VERIFIED WITH: CHANDLER,T RN AT 0143 01/24/2021 MITCHELL,L (NOTE) The GeneXpert MRSA Assay (FDA approved for NASAL specimens only), is one component of a comprehensive MRSA colonization surveillance program. It is not intended to diagnose MRSA infection nor to guide or monitor treatment for MRSA infections. Test performance is not FDA approved in patients less than 30 years old. Performed at [facility], [address] [place], [place] [address]      Labs: BNP (last 3 results) Recent Labs    08/15/20 0422 01/20/21 1500  BNP 504.1* 1,059.5*   Basic Metabolic Panel: Recent Labs  Lab 01/20/21 1500 01/21/21 0158 01/22/21 0518 01/23/21 0922 01/24/21 0502  NA 134* 136 137 134* 138  K 4.5 4.1 4.1 4.5 4.2  CL 105 105 109 107 110  CO2 21* 18* 20* 19* 19*  GLUCOSE 142* 67* 107* 218* 82  BUN 15 18 16 18 17   CREATININE 1.12* 1.56* 1.03* 1.00 0.97  CALCIUM 8.9 9.1 8.7* 8.5* 8.9   Liver Function Tests: Recent Labs  Lab 01/20/21 1500 01/20/21 1850 01/21/21 0158 01/22/21 0518 01/23/21 0922 01/24/21 0502  AST 442*  --  247* 76* 40 34  ALT 383*  --  309* 183* 122* 99*  ALKPHOS 187*  --  173* 140* 123 110  BILITOT 2.3* 2.6* 3.0* 0.9 0.7 0.8  PROT 7.2  --  6.4* 6.5 6.1*  6.0*  ALBUMIN 3.3*  --  3.1* 2.7* 2.6* 2.6*   Recent Labs  Lab 01/20/21 1500  LIPASE 22   No results for input(s): AMMONIA in the last 168 hours. CBC: Recent Labs  Lab 01/20/21 1500 01/21/21 0226 01/22/21 0518 01/23/21 0922 01/24/21 0502  WBC 14.4* 12.0* 12.3* 13.0* 11.4*  NEUTROABS 11.9*  --   --   --   --   HGB 14.2 14.5 14.0 13.0 13.7  HCT 42.1 44.6 41.7 40.8 42.1  MCV 86.4 87.3 86.7 88.5 87.9  PLT 182 154 152 177 182   Cardiac Enzymes: No results for input(s): CKTOTAL, CKMB, CKMBINDEX, TROPONINI in the last  168 hours. BNP: Invalid input(s): POCBNP CBG: Recent Labs  Lab 01/23/21 2018 01/24/21 0007 01/24/21 0422 01/24/21 0751 01/24/21 1115  GLUCAP 165* 105* 76 75 77   D-Dimer No results for input(s): DDIMER in the last 72 hours. Hgb A1c No results for input(s): HGBA1C in the last 72 hours. Lipid Profile No results for input(s): CHOL, HDL, LDLCALC, TRIG, CHOLHDL, LDLDIRECT in the last 72 hours. Thyroid function studies No results for input(s): TSH, T4TOTAL, T3FREE, THYROIDAB in the last 72 hours.  Invalid input(s): FREET3 Anemia work up No results for input(s): VITAMINB12, FOLATE, FERRITIN, TIBC, IRON, RETICCTPCT in the last 72 hours. Urinalysis    Component Value Date/Time   COLORURINE YELLOW 01/20/2021 1700   APPEARANCEUR CLEAR 01/20/2021 1700   LABSPEC 1.010 01/20/2021 1700   PHURINE 8.0 01/20/2021 1700   GLUCOSEU NEGATIVE 01/20/2021 1700   HGBUR TRACE (A) 01/20/2021 1700   BILIRUBINUR NEGATIVE 01/20/2021 1700   KETONESUR NEGATIVE 01/20/2021 1700   PROTEINUR NEGATIVE 01/20/2021 1700   UROBILINOGEN 1.0 08/16/2014 0423   NITRITE NEGATIVE 01/20/2021 1700   LEUKOCYTESUR NEGATIVE 01/20/2021 1700   Sepsis Labs Invalid input(s): PROCALCITONIN,  WBC,  LACTICIDVEN Microbiology Recent Results (from the past 240 hour(s))  Resp Panel by RT-PCR (Flu A&B, Covid) Nasopharyngeal Swab     Status: None   Collection Time: 01/20/21  3:00 PM   Specimen:  Nasopharyngeal Swab; Nasopharyngeal(NP) swabs in vial transport medium  Result Value Ref Range Status   SARS Coronavirus 2 by RT PCR NEGATIVE NEGATIVE Final    Comment: (NOTE) SARS-CoV-2 target nucleic acids are NOT DETECTED.  The SARS-CoV-2 RNA is generally detectable in upper respiratory specimens during the acute phase of infection. The lowest concentration of SARS-CoV-2 viral copies this assay can detect is 138 copies/mL. A negative result does not preclude SARS-Cov-2 infection and should not be used as the sole basis for treatment or other patient management decisions. A negative result may occur with  improper specimen collection/handling, submission of specimen other than nasopharyngeal swab, presence of viral mutation(s) within the areas targeted by this assay, and inadequate number of viral copies(<138 copies/mL). A negative result must be combined with clinical observations, patient history, and epidemiological information. The expected result is Negative.  Fact Sheet for Patients:  BloggerCourse.comhttps://www.fda.gov/media/152166/download  Fact Sheet for Healthcare Providers:  SeriousBroker.ithttps://www.fda.gov/media/152162/download  This test is no t yet approved or cleared by the [place] FDA and  has been authorized for detection and/or diagnosis of SARS-CoV-2 by FDA under an Emergency Use Authorization (EUA). This EUA will remain  in effect (meaning this test can be used) for the duration of the COVID-19 declaration under Section 564(b)(1) of the Act, 21 U.S.C.section 360bbb-3(b)(1), unless the authorization is terminated  or revoked sooner.       Influenza A by PCR NEGATIVE NEGATIVE Final   Influenza B by PCR NEGATIVE NEGATIVE Final    Comment: (NOTE) The Xpert Xpress SARS-CoV-2/FLU/RSV plus assay is intended as an aid in the diagnosis of influenza from Nasopharyngeal swab specimens and should not be used as a sole basis for treatment. Nasal washings and aspirates are unacceptable for  Xpert Xpress SARS-CoV-2/FLU/RSV testing.  Fact Sheet for Patients: BloggerCourse.comhttps://www.fda.gov/media/152166/download  Fact Sheet for Healthcare Providers: SeriousBroker.ithttps://www.fda.gov/media/152162/download  This test is not yet approved or cleared by the [place] FDA and has been authorized for detection and/or diagnosis of SARS-CoV-2 by FDA under an Emergency Use Authorization (EUA). This EUA will remain in effect (meaning this test can be used) for the duration of the  COVID-19 declaration under Section 564(b)(1) of the Act, 21 U.S.C. section 360bbb-3(b)(1), unless the authorization is terminated or revoked.  Performed at [facility], [address], [place], [place] [address]   Urine culture     Status: Abnormal   Collection Time: 01/20/21  5:00 PM   Specimen: Urine, Random  Result Value Ref Range Status   Specimen Description   Final    URINE, RANDOM Performed at [facility], [address] [place], [place] [address]    Special Requests   Final    NONE Performed at [facility], [address] [place], [place] [address]    Culture MULTIPLE SPECIES PRESENT, SUGGEST RECOLLECTION (A)  Final   Report Status 01/22/2021 FINAL  Final  MRSA Next Gen by PCR, Nasal     Status: Abnormal   Collection Time: 01/23/21 11:15 PM   Specimen: Nasal Mucosa; Nasal Swab  Result Value Ref Range Status   MRSA by PCR Next Gen DETECTED (A) NOT DETECTED Final    Comment: RESULT CALLED TO, READ BACK BY AND VERIFIED WITH: CHANDLER,T RN AT 0143 01/24/2021 MITCHELL,L (NOTE) The GeneXpert MRSA Assay (FDA approved for NASAL specimens only), is one component of a comprehensive MRSA colonization surveillance program. It is not intended to diagnose MRSA infection nor to guide or monitor treatment for MRSA infections. Test performance is not FDA approved in patients less than 79 years old. Performed at [facility], [address] [place], [place] [address]      Time  coordinating discharge: 35 minutes   30 Day Unplanned Readmission Risk Score    Flowsheet Row ED to Hosp-Admission (Discharged) from 01/20/2021 in [facility] GENERAL MED/SURG UNIT  30 Day Unplanned Readmission Risk Score (%) 17.15 Filed at 01/24/2021 1200       This score is the patient's risk of an unplanned readmission within 30 days of being discharged (0 -100%). The score is based on dignosis, age, lab data, medications, orders, and past utilization.   Low:  0-14.9   Medium: 15-21.9   High: 22-29.9   Extreme: 30 and above             SIGNED:   Alberteen Sam, MD  [facility] 01/24/2021, 6:35 PM

## 2021-01-24 NOTE — Plan of Care (Signed)
  Problem: Education: Goal: Knowledge of General Education information will improve Description: Including pain rating scale, medication(s)/side effects and non-pharmacologic comfort measures 01/24/2021 1300 by Arthor Captain, RN Outcome: Adequate for Discharge 01/24/2021 0929 by Arthor Captain, RN Outcome: Progressing   Problem: Health Behavior/Discharge Planning: Goal: Ability to manage health-related needs will improve 01/24/2021 1300 by Arthor Captain, RN Outcome: Adequate for Discharge 01/24/2021 0929 by Arthor Captain, RN Outcome: Progressing   Problem: Clinical Measurements: Goal: Ability to maintain clinical measurements within normal limits will improve 01/24/2021 1300 by Arthor Captain, RN Outcome: Adequate for Discharge 01/24/2021 0929 by Arthor Captain, RN Outcome: Progressing Goal: Will remain free from infection 01/24/2021 1300 by Arthor Captain, RN Outcome: Adequate for Discharge 01/24/2021 0929 by Arthor Captain, RN Outcome: Progressing Goal: Diagnostic test results will improve 01/24/2021 1300 by Arthor Captain, RN Outcome: Adequate for Discharge 01/24/2021 0929 by Arthor Captain, RN Outcome: Progressing Goal: Respiratory complications will improve 01/24/2021 1300 by Arthor Captain, RN Outcome: Adequate for Discharge 01/24/2021 0929 by Arthor Captain, RN Outcome: Progressing Goal: Cardiovascular complication will be avoided 01/24/2021 1300 by Arthor Captain, RN Outcome: Adequate for Discharge 01/24/2021 0929 by Arthor Captain, RN Outcome: Progressing

## 2021-01-24 NOTE — Plan of Care (Signed)

## 2021-01-27 ENCOUNTER — Observation Stay (HOSPITAL_COMMUNITY)
Admission: EM | Admit: 2021-01-27 | Discharge: 2021-01-28 | Disposition: A | Discharge status: Home / Self Care | Payer: Medicare Other | Attending: Internal Medicine | Admitting: Internal Medicine

## 2021-01-27 ENCOUNTER — Inpatient Hospital Stay (HOSPITAL_COMMUNITY): Payer: Medicare Other

## 2021-01-27 ENCOUNTER — Other Ambulatory Visit: Payer: Self-pay

## 2021-01-27 ENCOUNTER — Encounter (HOSPITAL_COMMUNITY): Payer: Self-pay | Admitting: Internal Medicine

## 2021-01-27 ENCOUNTER — Emergency Department (HOSPITAL_COMMUNITY): Payer: Medicare Other

## 2021-01-27 DIAGNOSIS — E119 Type 2 diabetes mellitus without complications: Secondary | ICD-10-CM

## 2021-01-27 DIAGNOSIS — K219 Gastro-esophageal reflux disease without esophagitis: Secondary | ICD-10-CM | POA: Diagnosis present

## 2021-01-27 DIAGNOSIS — Z7982 Long term (current) use of aspirin: Secondary | ICD-10-CM | POA: Insufficient documentation

## 2021-01-27 DIAGNOSIS — N179 Acute kidney failure, unspecified: Secondary | ICD-10-CM | POA: Diagnosis present

## 2021-01-27 DIAGNOSIS — Z7901 Long term (current) use of anticoagulants: Secondary | ICD-10-CM | POA: Diagnosis not present

## 2021-01-27 DIAGNOSIS — Z87891 Personal history of nicotine dependence: Secondary | ICD-10-CM | POA: Diagnosis not present

## 2021-01-27 DIAGNOSIS — D72829 Elevated white blood cell count, unspecified: Secondary | ICD-10-CM | POA: Insufficient documentation

## 2021-01-27 DIAGNOSIS — J449 Chronic obstructive pulmonary disease, unspecified: Secondary | ICD-10-CM | POA: Insufficient documentation

## 2021-01-27 DIAGNOSIS — K921 Melena: Secondary | ICD-10-CM | POA: Diagnosis present

## 2021-01-27 DIAGNOSIS — K3189 Other diseases of stomach and duodenum: Secondary | ICD-10-CM | POA: Diagnosis not present

## 2021-01-27 DIAGNOSIS — I110 Hypertensive heart disease with heart failure: Secondary | ICD-10-CM | POA: Diagnosis not present

## 2021-01-27 DIAGNOSIS — Z79899 Other long term (current) drug therapy: Secondary | ICD-10-CM | POA: Diagnosis not present

## 2021-01-27 DIAGNOSIS — I5032 Chronic diastolic (congestive) heart failure: Secondary | ICD-10-CM | POA: Diagnosis present

## 2021-01-27 DIAGNOSIS — I482 Chronic atrial fibrillation, unspecified: Secondary | ICD-10-CM | POA: Insufficient documentation

## 2021-01-27 DIAGNOSIS — R1011 Right upper quadrant pain: Secondary | ICD-10-CM

## 2021-01-27 DIAGNOSIS — K922 Gastrointestinal hemorrhage, unspecified: Secondary | ICD-10-CM | POA: Diagnosis not present

## 2021-01-27 DIAGNOSIS — Z7984 Long term (current) use of oral hypoglycemic drugs: Secondary | ICD-10-CM | POA: Insufficient documentation

## 2021-01-27 DIAGNOSIS — Z20822 Contact with and (suspected) exposure to covid-19: Secondary | ICD-10-CM | POA: Diagnosis not present

## 2021-01-27 DIAGNOSIS — I4821 Permanent atrial fibrillation: Secondary | ICD-10-CM | POA: Diagnosis present

## 2021-01-27 DIAGNOSIS — K819 Cholecystitis, unspecified: Secondary | ICD-10-CM | POA: Diagnosis present

## 2021-01-27 DIAGNOSIS — I10 Essential (primary) hypertension: Secondary | ICD-10-CM | POA: Diagnosis present

## 2021-01-27 DIAGNOSIS — I4891 Unspecified atrial fibrillation: Secondary | ICD-10-CM | POA: Diagnosis present

## 2021-01-27 LAB — POC OCCULT BLOOD, ED: Fecal Occult Bld: POSITIVE — AB

## 2021-01-27 LAB — TYPE AND SCREEN
ABO/RH(D): B POS
Antibody Screen: NEGATIVE

## 2021-01-27 LAB — COMPREHENSIVE METABOLIC PANEL
ALT: 153 U/L — ABNORMAL HIGH (ref 0–44)
AST: 92 U/L — ABNORMAL HIGH (ref 15–41)
Albumin: 2.9 g/dL — ABNORMAL LOW (ref 3.5–5.0)
Alkaline Phosphatase: 253 U/L — ABNORMAL HIGH (ref 38–126)
Anion gap: 10 (ref 5–15)
BUN: 24 mg/dL — ABNORMAL HIGH (ref 8–23)
CO2: 22 mmol/L (ref 22–32)
Calcium: 9.1 mg/dL (ref 8.9–10.3)
Chloride: 105 mmol/L (ref 98–111)
Creatinine, Ser: 1.17 mg/dL — ABNORMAL HIGH (ref 0.44–1.00)
GFR, Estimated: 47 mL/min — ABNORMAL LOW (ref >60)
Glucose, Bld: 125 mg/dL — ABNORMAL HIGH (ref 70–99)
Potassium: 3.9 mmol/L (ref 3.5–5.1)
Sodium: 137 mmol/L (ref 135–145)
Total Bilirubin: 1.4 mg/dL — ABNORMAL HIGH (ref 0.3–1.2)
Total Protein: 6.9 g/dL (ref 6.5–8.1)

## 2021-01-27 LAB — CBC WITH DIFFERENTIAL/PLATELET
Abs Immature Granulocytes: 0.31 10*3/uL — ABNORMAL HIGH (ref 0.00–0.07)
Basophils Absolute: 0.1 10*3/uL (ref 0.0–0.1)
Basophils Relative: 1 %
Eosinophils Absolute: 0.1 10*3/uL (ref 0.0–0.5)
Eosinophils Relative: 1 %
HCT: 45.9 % (ref 36.0–46.0)
Hemoglobin: 14.9 g/dL (ref 12.0–15.0)
Immature Granulocytes: 2 %
Lymphocytes Relative: 17 %
Lymphs Abs: 2.4 10*3/uL (ref 0.7–4.0)
MCH: 28.8 pg (ref 26.0–34.0)
MCHC: 32.5 g/dL (ref 30.0–36.0)
MCV: 88.8 fL (ref 80.0–100.0)
Monocytes Absolute: 1.2 10*3/uL — ABNORMAL HIGH (ref 0.1–1.0)
Monocytes Relative: 8 %
Neutro Abs: 10.2 10*3/uL — ABNORMAL HIGH (ref 1.7–7.7)
Neutrophils Relative %: 71 %
Platelets: 228 10*3/uL (ref 150–400)
RBC: 5.17 MIL/uL — ABNORMAL HIGH (ref 3.87–5.11)
RDW: 16.1 % — ABNORMAL HIGH (ref 11.5–15.5)
WBC: 14.3 10*3/uL — ABNORMAL HIGH (ref 4.0–10.5)
nRBC: 0.1 % (ref 0.0–0.2)

## 2021-01-27 LAB — HEMOGLOBIN AND HEMATOCRIT, BLOOD
HCT: 37.8 % (ref 36.0–46.0)
Hemoglobin: 12.1 g/dL (ref 12.0–15.0)

## 2021-01-27 LAB — RESP PANEL BY RT-PCR (FLU A&B, COVID) ARPGX2
Influenza A by PCR: NEGATIVE
Influenza B by PCR: NEGATIVE
SARS Coronavirus 2 by RT PCR: NEGATIVE

## 2021-01-27 MED ORDER — SODIUM CHLORIDE 0.9 % IV BOLUS
250.0000 mL | Freq: Once | INTRAVENOUS | Status: AC
  Administered 2021-01-27: 250 mL via INTRAVENOUS

## 2021-01-27 MED ORDER — METFORMIN HCL 500 MG PO TABS
500.0000 mg | ORAL_TABLET | Freq: Two times a day (BID) | ORAL | Status: DC
  Administered 2021-01-28: 500 mg via ORAL
  Filled 2021-01-27: qty 1

## 2021-01-27 MED ORDER — PREDNISONE 20 MG PO TABS: 20.0000 mg | ORAL_TABLET | Freq: Every day | ORAL | Status: DC

## 2021-01-27 MED ORDER — DORZOLAMIDE HCL 2 % OP SOLN
1.0000 [drp] | Freq: Every day | OPHTHALMIC | Status: DC
  Administered 2021-01-27: 1 [drp] via OPHTHALMIC
  Filled 2021-01-27: qty 10

## 2021-01-27 MED ORDER — METOPROLOL TARTRATE 5 MG/5ML IV SOLN: 2.5000 mg | Freq: Four times a day (QID) | INTRAVENOUS | Status: DC | PRN

## 2021-01-27 MED ORDER — SODIUM CHLORIDE 0.9 % IV SOLN
3.0000 g | Freq: Three times a day (TID) | INTRAVENOUS | Status: DC
  Administered 2021-01-27 – 2021-01-28 (×4): 3 g via INTRAVENOUS
  Filled 2021-01-27 (×3): qty 8
  Filled 2021-01-27: qty 3
  Filled 2021-01-27 (×4): qty 8

## 2021-01-27 MED ORDER — ONDANSETRON HCL 4 MG PO TABS: 4.0000 mg | ORAL_TABLET | Freq: Four times a day (QID) | ORAL | Status: DC | PRN

## 2021-01-27 MED ORDER — BRIMONIDINE TARTRATE 0.2 % OP SOLN
1.0000 [drp] | Freq: Three times a day (TID) | OPHTHALMIC | Status: DC
  Administered 2021-01-27 – 2021-01-28 (×4): 1 [drp] via OPHTHALMIC
  Filled 2021-01-27: qty 5

## 2021-01-27 MED ORDER — PANTOPRAZOLE INFUSION (NEW) - SIMPLE MED
8.0000 mg/h | INTRAVENOUS | Status: DC
  Administered 2021-01-27 – 2021-01-28 (×2): 8 mg/h via INTRAVENOUS
  Filled 2021-01-27: qty 80
  Filled 2021-01-27: qty 100
  Filled 2021-01-27: qty 80
  Filled 2021-01-27: qty 100

## 2021-01-27 MED ORDER — METFORMIN HCL 500 MG PO TABS: 500.0000 mg | ORAL_TABLET | ORAL | Status: DC

## 2021-01-27 MED ORDER — GABAPENTIN 300 MG PO CAPS
300.0000 mg | ORAL_CAPSULE | Freq: Every day | ORAL | Status: DC
  Administered 2021-01-27: 300 mg via ORAL
  Filled 2021-01-27: qty 1

## 2021-01-27 MED ORDER — CALCIUM CARBONATE-VITAMIN D 500-200 MG-UNIT PO TABS
1.0000 | ORAL_TABLET | Freq: Every day | ORAL | Status: DC
  Filled 2021-01-27: qty 1

## 2021-01-27 MED ORDER — PANTOPRAZOLE 80MG IVPB - SIMPLE MED
80.0000 mg | Freq: Once | INTRAVENOUS | Status: AC
  Administered 2021-01-27: 80 mg via INTRAVENOUS
  Filled 2021-01-27: qty 80

## 2021-01-27 MED ORDER — AMOXICILLIN-POT CLAVULANATE 875-125 MG PO TABS: 1.0000 | ORAL_TABLET | Freq: Two times a day (BID) | ORAL | Status: DC

## 2021-01-27 MED ORDER — ONDANSETRON HCL 4 MG/2ML IJ SOLN
4.0000 mg | Freq: Four times a day (QID) | INTRAMUSCULAR | Status: DC | PRN
Start: 2021-01-27 — End: 2021-01-28

## 2021-01-27 MED ORDER — MAGNESIUM OXIDE -MG SUPPLEMENT 400 (240 MG) MG PO TABS
400.0000 mg | ORAL_TABLET | Freq: Every day | ORAL | Status: DC
  Administered 2021-01-28: 400 mg via ORAL
  Filled 2021-01-27: qty 1

## 2021-01-27 MED ORDER — BUSPIRONE HCL 5 MG PO TABS
10.0000 mg | ORAL_TABLET | Freq: Two times a day (BID) | ORAL | Status: DC
  Administered 2021-01-27 – 2021-01-28 (×2): 10 mg via ORAL
  Filled 2021-01-27 (×2): qty 2

## 2021-01-27 MED ORDER — INSULIN ASPART 100 UNIT/ML IJ SOLN
0.0000 [IU] | Freq: Three times a day (TID) | INTRAMUSCULAR | Status: DC
  Administered 2021-01-28: 1 [IU] via SUBCUTANEOUS

## 2021-01-27 MED ORDER — ASPIRIN 81 MG PO CHEW
81.0000 mg | CHEWABLE_TABLET | Freq: Every day | ORAL | Status: DC
  Administered 2021-01-28: 81 mg via ORAL
  Filled 2021-01-27: qty 1

## 2021-01-27 MED ORDER — PREDNISONE 20 MG PO TABS: 10.0000 mg | ORAL_TABLET | Freq: Every day | ORAL | Status: DC

## 2021-01-27 MED ORDER — ALBUTEROL SULFATE (2.5 MG/3ML) 0.083% IN NEBU: 2.5000 mg | INHALATION_SOLUTION | Freq: Four times a day (QID) | RESPIRATORY_TRACT | Status: DC | PRN

## 2021-01-27 MED ORDER — POLYETHYLENE GLYCOL 3350 17 G PO PACK: 17.0000 g | PACK | Freq: Every day | ORAL | Status: DC | PRN

## 2021-01-27 MED ORDER — SODIUM CHLORIDE 0.9 % IV SOLN: INTRAVENOUS | Status: DC

## 2021-01-27 NOTE — ED Triage Notes (Signed)
Pt via EMS from MD office for dark tarry stool x 3 days, vomiting BRB. Pt endorses generalized abd pain and nausea, lightheadedness when standing. Hx orthostatic hypotension. Initial BP 80/60. Taking lovenox injections and warfarin. 4mg  zofran and 500 cc bolus given by EMS.

## 2021-01-27 NOTE — ED Notes (Signed)
Called for report, RN currently unavailable, will call back in 5 mins.

## 2021-01-27 NOTE — ED Provider Notes (Signed)
[facility] EMERGENCY DEPARTMENT Provider Note   CSN: [id number] Arrival date & time: 01/27/21  1020     History No chief complaint on file.   Jill Crane is a 80 y.o. female.   Pt presents to the ED today with a possible GI bleed.  Pt was admitted to the hospital from 7/7-7/11 for choledocholithiasis.  Coumadin was held in anticipation for surgery.  Pt was seen by [facility] (Dr. Marina Goodell) and had an ERCP with stone removal.  She was going to have her gallbladder removed on the 11th, but opted not to have this done and was d/c back with coumadin with lovenox as a bridge.  She went to her doctor today and told her doctor she had been having black stool and has felt lightheaded.  BP initially 80/60.  EMS gave pt 4 mg zofran and 500 cc of fluid en route and sbp is now up to 126.      Past Medical History:  Diagnosis Date   Atrial fibrillation with normal ventricular rate (HCC)    CHF (congestive heart failure) (HCC)    COPD (chronic obstructive pulmonary disease) (HCC)    Diabetes mellitus without complication (HCC)    Hx of blood clots    Hypertension     Patient Active Problem List   Diagnosis Date Noted   GI bleed 01/27/2021   Choledocholithiasis    Current chronic use of systemic steroids 01/21/2021   Cholecystitis 01/20/2021   SIRS (systemic inflammatory response syndrome) (HCC) 08/15/2020   Acute lower UTI 08/15/2020   A-fib (HCC) 08/15/2020   Diabetes (HCC) 08/15/2020   Essential hypertension 08/15/2020   Sepsis (HCC) 11/10/2019    Past Surgical History:  Procedure Laterality Date   ENDOSCOPIC RETROGRADE CHOLANGIOPANCREATOGRAPHY (ERCP) WITH PROPOFOL N/A 01/22/2021   Procedure: ENDOSCOPIC RETROGRADE CHOLANGIOPANCREATOGRAPHY (ERCP) WITH PROPOFOL;  Surgeon: Hilarie Fredrickson, MD;  Location: [facility] ENDOSCOPY;  Service: Endoscopy;  Laterality: N/A;   REMOVAL OF STONES  01/22/2021   Procedure: REMOVAL OF STONES;  Surgeon: Hilarie Fredrickson, MD;  Location: [facility] ENDOSCOPY;   Service: Endoscopy;;   SPHINCTEROTOMY  01/22/2021   Procedure: Dennison Mascot;  Surgeon: Hilarie Fredrickson, MD;  Location: [facility] ENDOSCOPY;  Service: Endoscopy;;     OB History   No obstetric history on file.     Family History  Problem Relation Age of Onset   Stroke Mother    Diabetes Mother    Stroke Father     Social History   Tobacco Use   Smoking status: Former   Smokeless tobacco: Never  Substance Use Topics   Alcohol use: No    Home Medications Prior to Admission medications   Medication Sig Start Date End Date Taking? Authorizing Provider  albuterol (PROVENTIL HFA;VENTOLIN HFA) 108 (90 BASE) MCG/ACT inhaler Inhale 2 puffs into the lungs every 6 (six) hours as needed for wheezing or shortness of breath.    [provider]  alendronate (FOSAMAX) 35 MG tablet Take 35 mg by mouth every Monday. Take with a full glass of water on an empty stomach.    [provider]  amoxicillin-clavulanate (AUGMENTIN) 875-125 MG tablet Take 1 tablet by mouth 2 (two) times daily for 5 days. 01/24/21 01/29/21  DanfordEarl Lites, MD  aspirin 81 MG chewable tablet Chew 81 mg by mouth daily.    [provider]  brimonidine (ALPHAGAN) 0.2 % ophthalmic solution Place 1 drop into the left eye in the morning and at bedtime. 10/21/19  [provider]  busPIRone (BUSPAR) 10 MG tablet Take 10 mg by mouth 2 (two) times daily. 08/04/20   [provider]  calcium-vitamin D (OSCAL WITH D) 500-200 MG-UNIT per tablet Take 1 tablet by mouth.    [provider]  dorzolamide (TRUSOPT) 2 % ophthalmic solution Place 1 drop into both eyes at bedtime. 08/03/20   [provider]  enoxaparin (LOVENOX) 80 MG/0.8ML injection Inject 0.7 mLs (70 mg total) into the skin 2 (two) times daily for 8 days. 01/24/21 02/01/21  Danford, Earl Lites, MD  furosemide (LASIX) 20 MG tablet Take 40 mg (2 tablets) tomorrow and the day after, then continue with 20 mg daily 04/15/17    Arthor Captain, PA-C  gabapentin (NEURONTIN) 300 MG capsule Take 300 mg by mouth at bedtime. 08/10/20   [provider]  magnesium oxide (MAG-OX) 400 (241.3 Mg) MG tablet Take 400 mg by mouth daily. 07/08/20   [provider]  MATZIM LA 240 MG 24 hr tablet Take 240 mg by mouth daily. 08/03/20   [provider]  metFORMIN (GLUCOPHAGE) 500 MG tablet Take 500-1,000 mg by mouth See admin instructions. Take 2 tablets with morning meal, and 1 tablet with evening meals 08/24/20   [provider]  metoprolol tartrate (LOPRESSOR) 25 MG tablet Take 1 tablet (25 mg total) by mouth 2 (two) times daily. 08/17/20   Rhetta Mura, MD  omeprazole (PRILOSEC) 20 MG capsule Take 20 mg by mouth daily.    [provider]  polyethylene glycol powder (GLYCOLAX/MIRALAX) 17 GM/SCOOP powder Take 17 g by mouth daily as needed for constipation.    [provider]  predniSONE (DELTASONE) 2.5 MG tablet Take 10 mg by mouth daily with breakfast.    [provider]  silver sulfADIAZINE (SILVADENE) 1 % cream Apply 1 application topically daily as needed for irritation. to scalp 06/16/20   [provider]  warfarin (COUMADIN) 2 MG tablet Take 2-4 mg by mouth See admin instructions. Take 1 tablet (2 mg) daily except Take 2 tablets (4 mg) on (Tues & Fri) 06/02/20   [provider]    Allergies    Patient has no known allergies.  Review of Systems   Review of Systems  Gastrointestinal:        Black stool  Neurological:  Positive for dizziness.  All other systems reviewed and are negative.  Physical Exam Updated Vital Signs BP (!) 104/58   Pulse 85   Temp 98.5 F (36.9 C) (Oral)   Resp (!) 23   SpO2 97%   Physical Exam Vitals and nursing note reviewed. Exam conducted with a chaperone present.  Constitutional:      Appearance: Normal appearance.  HENT:     Head: Normocephalic and atraumatic.     Right Ear: External ear normal.     Left  Ear: External ear normal.     Nose: Nose normal.     Mouth/Throat:     Mouth: Mucous membranes are moist.     Pharynx: Oropharynx is clear.  Eyes:     Extraocular Movements: Extraocular movements intact.     Conjunctiva/sclera: Conjunctivae normal.     Pupils: Pupils are equal, round, and reactive to light.  Cardiovascular:     Rate and Rhythm: Normal rate. Rhythm irregular.     Pulses: Normal pulses.     Heart sounds: Normal heart sounds.  Pulmonary:     Effort: Pulmonary effort is normal.     Breath sounds: Normal  breath sounds.  Abdominal:     General: Abdomen is flat. Bowel sounds are normal.     Palpations: Abdomen is soft.  Genitourinary:    Rectum: Guaiac result positive. External hemorrhoid present.     Comments: Dark, tarry stool Musculoskeletal:        General: Normal range of motion.     Cervical back: Normal range of motion and neck supple.  Skin:    General: Skin is warm.     Capillary Refill: Capillary refill takes less than 2 seconds.  Neurological:     General: No focal deficit present.     Mental Status: She is alert and oriented to person, place, and time.  Psychiatric:        Mood and Affect: Mood normal.        Behavior: Behavior normal.        Thought Content: Thought content normal.        Judgment: Judgment normal.         ED Results / Procedures / Treatments   Labs (all labs ordered are listed, but only abnormal results are displayed) Labs Reviewed  COMPREHENSIVE METABOLIC PANEL - Abnormal; Notable for the following components:      Result Value   Glucose, Bld 125 (*)    BUN 24 (*)    Creatinine, Ser 1.17 (*)    Albumin 2.9 (*)    AST 92 (*)    ALT 153 (*)    Alkaline Phosphatase 253 (*)    Total Bilirubin 1.4 (*)    GFR, Estimated 47 (*)    All other components within normal limits  CBC WITH DIFFERENTIAL/PLATELET - Abnormal; Notable for the following components:   WBC 14.3 (*)    RBC 5.17 (*)    RDW 16.1 (*)    Neutro Abs 10.2 (*)     Monocytes Absolute 1.2 (*)    Abs Immature Granulocytes 0.31 (*)    All other components within normal limits  POC OCCULT BLOOD, ED - Abnormal; Notable for the following components:   Fecal Occult Bld POSITIVE (*)    All other components within normal limits  RESP PANEL BY RT-PCR (FLU A&B, COVID) [id number]  HEMOGLOBIN AND HEMATOCRIT, BLOOD  TYPE AND SCREEN    EKG EKG Interpretation  Date/Time:  Thursday January 27 2021 10:31:08 EDT Ventricular Rate:  79 PR Interval:    QRS Duration: 82 QT Interval:  339 QTC Calculation: 389 R Axis:   19 Text Interpretation: Atrial fibrillation Anteroseptal infarct, old No significant change since last tracing Confirmed by Jacalyn Lefevre [phone]) on 01/27/2021 10:55:10 AM  Radiology DG Chest Port 1 View  Result Date: 01/27/2021 CLINICAL DATA:  GI bleed EXAM: PORTABLE CHEST 1 VIEW COMPARISON:  01/20/2021 FINDINGS: Mild cardiomegaly. No confluent opacities or effusions. No acute bony abnormality. IMPRESSION: Cardiomegaly.  No active disease. Electronically Signed   By: Charlett Nose M.D.   On: 01/27/2021 10:44    Procedures Procedures   Medications Ordered in ED Medications  0.9 %  sodium chloride infusion ( Intravenous New Bag/Given 01/27/21 1045)  pantoprozole (PROTONIX) 80 mg /NS 100 mL infusion (8 mg/hr Intravenous New Bag/Given 01/27/21 1238)  aspirin chewable tablet 81 mg (has no administration in time range)  metoprolol tartrate (LOPRESSOR) injection 2.5 mg (has no administration in time range)  busPIRone (BUSPAR) tablet 10 mg (has no administration in time range)  polyethylene glycol (MIRALAX / GLYCOLAX) packet 17 g (has no administration in time range)  gabapentin (NEURONTIN) capsule  300 mg (has no administration in time range)  calcium-vitamin D (OSCAL WITH D) 500-200 MG-UNIT per tablet 1 tablet (has no administration in time range)  magnesium oxide (MAG-OX) tablet 400 mg (has no administration in time range)  albuterol (PROVENTIL) (2.5  MG/3ML) 0.083% nebulizer solution 2.5 mg (has no administration in time range)  brimonidine (ALPHAGAN) 0.2 % ophthalmic solution 1 drop (has no administration in time range)  dorzolamide (TRUSOPT) 2 % ophthalmic solution 1 drop (has no administration in time range)  ondansetron (ZOFRAN) tablet 4 mg (has no administration in time range)    Or  ondansetron (ZOFRAN) injection 4 mg (has no administration in time range)  metFORMIN (GLUCOPHAGE) tablet 500 mg (has no administration in time range)  predniSONE (DELTASONE) tablet 20 mg (has no administration in time range)  insulin aspart (novoLOG) injection 0-9 Units (has no administration in time range)  pantoprazole (PROTONIX) 80 mg /NS 100 mL IVPB (0 mg Intravenous Stopped 01/27/21 1233)    ED Course  I have reviewed the triage vital signs and the nursing notes.  Pertinent labs & imaging results that were available during my care of the patient were reviewed by me and considered in my medical decision making (see chart for details).    MDM Rules/Calculators/A&P                          CHA2DS2/VAS Stroke Risk Points  Current as of 7 minutes ago     6 >= 2 Points: High Risk  1 - 1.99 Points: Medium Risk  0 Points: Low Risk    No Change      Details    This score determines the patient's risk of having a stroke if the  patient has atrial fibrillation.       Points Metrics  1 Has Congestive Heart Failure:  Yes     Current as of 7 minutes ago  0 Has Vascular Disease:  No    Current as of 7 minutes ago  1 Has Hypertension:  Yes    Current as of 7 minutes ago  2 Age:  [age]    Current as of 7 minutes ago  1 Has Diabetes:  Yes    Current as of 7 minutes ago  0 Had Stroke:  No  Had TIA:  No  Had Thromboembolism:  No    Current as of 7 minutes ago  1 Female:  Yes    Current as of 7 minutes ago   Pt started on protonix bolus and drip.  Hgb is stable.    CRITICAL CARE Performed by: Jacalyn LefevreJulie Shaneal Barasch   Total critical care time: 30  minutes  Critical care time was exclusive of separately billable procedures and treating other patients.  Critical care was necessary to treat or prevent imminent or life-threatening deterioration.  Critical care was time spent personally by me on the following activities: development of treatment plan with patient and/or surrogate as well as nursing, discussions with consultants, evaluation of patient's response to treatment, examination of patient, obtaining history from patient or surrogate, ordering and performing treatments and interventions, ordering and review of laboratory studies, ordering and review of radiographic studies, pulse oximetry and re-evaluation of patient's condition.    Pt d/w Dr. Chipper HerbZhang ([facility]) for admission.  Pt d/w PA Gribbin ([facility]) for consult.     Final Clinical Impression(s) / ED Diagnoses Final diagnoses:  GI bleed  Anticoagulated on Coumadin  Atrial fibrillation with  controlled ventricular rate (HCC)    Rx / DC Orders ED Discharge Orders     None        Jacalyn Lefevre, MD 01/27/21 1310

## 2021-01-27 NOTE — Progress Notes (Signed)
Pharmacy Antibiotic Note  Jill Crane is a 80 y.o. female admitted on 01/27/2021 with  IAI .  Pharmacy has been consulted for Unasyn dosing.  Plan: Unasyn 3g IV q8h (was on Augmentin 875-125mg  x 5 days outpatient, today was D4 of PO therapy, last dose this morning PTA) -Monitor renal function, clinical status, and antibiotic plan   Temp (24hrs), Avg:98.5 F (36.9 C), Min:98.5 F (36.9 C), Max:98.5 F (36.9 C)  Recent Labs  Lab 01/21/21 0158 01/21/21 0226 01/22/21 0518 01/23/21 0922 01/24/21 0502 01/27/21 1050  WBC  --  12.0* 12.3* 13.0* 11.4* 14.3*  CREATININE 1.56*  --  1.03* 1.00 0.97 1.17*    Estimated Creatinine Clearance: 39.9 mL/min (A) (by C-G formula based on SCr of 1.17 mg/dL (H)).    No Known Allergies  Antimicrobials this admission: Unasyn 7/14 >>   Thank you for allowing pharmacy to be a part of this patient's care.  Loleta Dicker, PharmD, [facility] Emergency Medicine Clinical Pharmacist ED RPh Phone: [phone] Main RX: [phone]

## 2021-01-27 NOTE — ED Notes (Signed)
Got patient some socks patient is resting with call bell in reach

## 2021-01-27 NOTE — Consult Note (Addendum)
[facility] Consult: 1:10 PM 01/27/2021  LOS: 0 days    Referring Provider: ED MD  Gilford Raid Primary Care Physician:  Burman Freestone, MD Primary Gastroenterologist:  Dr Aurelio Brash    Reason for Consultation: Melena in setting of Lovenox, Coumadin and recent ERCP, sphincterotomy, stone removed   HPI: Jill Crane is a 80 y.o. female.  Past medical history A. fib.  DVT, right IJ thrombus 2014.  Chronic Coumadin.  COPD.  Systolic/diastolic heart failure.  DM2.  Gallstones, gallbladder polyps, nondilated biliary tree per ultrasound 11/2019.  Fatty liver, hepatic cyst per ultrasound 2014.  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  HCAP/sepsis 2014 requiring HD for AKI. 03/2010 colonoscopy.  Polyps removed, pathology tubular adenoma.  Procedure report not found 03/2010 EGD.  Pathology of small bowel biopsies benign, no changes of sprue.  Procedure report not found.  Evaluated during recent hospitalization by GI on 7/8 for choledocholithiasis. 01/22/2021 ERCP: Underwent sphincterotomy and stone extraction.  Stomach, duodenum grossly normal.  Small periampullary diverticulum. Surgical plan was lap chole but patient declined surgery so she was discharged home 7/11 after 5-day admission.  Her plan was to see how things would progress and perhaps have surgery at a future date.  Coumadin was restarted but she was provided with Lovenox window both of which she continues to take.  Plan was to finish 5-day course of Augmentin.  Since discharge on Monday and return home she has noticed tarry, black stools.  Still experiencing generalized abdominal pain and nausea.  Dizzy/lightheaded when standing.  Went for check of INR at MD office today, it was 1.2 but she had a bout of pinkish emesis followed by clear, phlegmy  emesis and was sent to the ED.  Hypotension of 80/60 at arrival to ED, improved after 500 mL bolus  Hgb 14.9, was 14.5  - 13 during recent admission T bili 1.4.  Alk phos 253.  AST/ALT 92/153.  These compare with max readings of 3.0, 187, 442/383 max levels on 7/8  Took her Coumadin dose last night, injected Lovenox this morning.    Past Medical History:  Diagnosis Date   Atrial fibrillation with normal ventricular rate (HCC)    CHF (congestive heart failure) (HCC)    COPD (chronic obstructive pulmonary disease) ([place])    Diabetes mellitus without complication ([place])    Hx of blood clots    Hypertension     Past Surgical History:  Procedure Laterality Date   ENDOSCOPIC RETROGRADE CHOLANGIOPANCREATOGRAPHY (ERCP) WITH PROPOFOL N/A 01/22/2021   Procedure: ENDOSCOPIC RETROGRADE CHOLANGIOPANCREATOGRAPHY (ERCP) WITH PROPOFOL;  Surgeon: Irene Shipper, MD;  Location: [facility];  Service: Endoscopy;  Laterality: N/A;   REMOVAL OF STONES  01/22/2021   Procedure: REMOVAL OF STONES;  Surgeon: Irene Shipper, MD;  Location: [facility] ENDOSCOPY;  Service: Endoscopy;;   SPHINCTEROTOMY  01/22/2021   Procedure: Joan Mayans;  Surgeon: Irene Shipper, MD;  Location: [facility] ENDOSCOPY;  Service: Endoscopy;;    Prior to Admission medications   Medication Sig Start Date End Date Taking? Authorizing Provider  albuterol (PROVENTIL HFA;VENTOLIN HFA) 108 (90 BASE) MCG/ACT inhaler Inhale 2 puffs into the lungs every 6 (six) hours as needed for wheezing or shortness of breath.    [provider]  alendronate (FOSAMAX) 35 MG tablet Take 35 mg by mouth every Monday. Take with a full glass of water on an empty stomach.    [provider]  amoxicillin-clavulanate (AUGMENTIN) 875-125 MG tablet Take 1 tablet by mouth 2 (two) times daily for 5 days. 01/24/21 01/29/21  DanfordSuann Larry, MD  aspirin 81 MG chewable tablet Chew 81 mg by mouth daily.    [provider]  brimonidine (ALPHAGAN) 0.2 % ophthalmic  solution Place 1 drop into the left eye in the morning and at bedtime. 10/21/19   [provider]  busPIRone (BUSPAR) 10 MG tablet Take 10 mg by mouth 2 (two) times daily. 08/04/20   [provider]  calcium-vitamin D (OSCAL WITH D) 500-200 MG-UNIT per tablet Take 1 tablet by mouth.    [provider]  dorzolamide (TRUSOPT) 2 % ophthalmic solution Place 1 drop into both eyes at bedtime. 08/03/20   [provider]  enoxaparin (LOVENOX) 80 MG/0.8ML injection Inject 0.7 mLs (70 mg total) into the skin 2 (two) times daily for 8 days. 01/24/21 02/01/21  Danford, Suann Larry, MD  furosemide (LASIX) 20 MG tablet Take 40 mg (2 tablets) tomorrow and the day after, then continue with 20 mg daily 04/15/17   Margarita Mail, PA-C  gabapentin (NEURONTIN) 300 MG capsule Take 300 mg by mouth at bedtime. 08/10/20   [provider]  magnesium oxide (MAG-OX) 400 (241.3 Mg) MG tablet Take 400 mg by mouth daily. 07/08/20   [provider]  MATZIM LA 240 MG 24 hr tablet Take 240 mg by mouth daily. 08/03/20   [provider]  metFORMIN (GLUCOPHAGE) 500 MG tablet Take 500-1,000 mg by mouth See admin instructions. Take 2 tablets with morning meal, and 1 tablet with evening meals 08/24/20   [provider]  metoprolol tartrate (LOPRESSOR) 25 MG tablet Take 1 tablet (25 mg total) by mouth 2 (two) times daily. 08/17/20   Nita Sells, MD  omeprazole (PRILOSEC) 20 MG capsule Take 20 mg by mouth daily.    [provider]  polyethylene glycol powder (GLYCOLAX/MIRALAX) 17 GM/SCOOP powder Take 17 g by mouth daily as needed for constipation.    [provider]  predniSONE (DELTASONE) 2.5 MG tablet Take 10 mg by mouth daily with breakfast.    [provider]  silver sulfADIAZINE (SILVADENE) 1 % cream Apply 1 application topically daily as needed for irritation. to scalp 06/16/20   [provider]  warfarin (COUMADIN) 2 MG tablet  Take 2-4 mg by mouth See admin instructions. Take 1 tablet (2 mg) daily except Take 2 tablets (4 mg) on (Tues & Fri) 06/02/20   [provider]    Scheduled Meds:  aspirin  81 mg Oral Daily   brimonidine  1 drop Left Eye TID   busPIRone  10 mg Oral BID   [START ON 01/28/2021] calcium-vitamin D  1 tablet Oral Q breakfast   dorzolamide  1 drop Both Eyes QHS   gabapentin  300 mg Oral QHS   insulin aspart  0-9 Units Subcutaneous TID WC   magnesium oxide  400 mg Oral Daily   metFORMIN  500 mg Oral BID WC   [START ON 01/28/2021] predniSONE  20 mg Oral Q breakfast   Infusions:  sodium chloride 125 mL/hr  at 01/27/21 1045   pantoprazole 8 mg/hr (01/27/21 1238)   PRN Meds: albuterol, metoprolol tartrate, ondansetron **OR** ondansetron (ZOFRAN) IV, polyethylene glycol   Allergies as of 01/27/2021   (No Known Allergies)    Family History  Problem Relation Age of Onset   Stroke Mother    Diabetes Mother    Stroke Father     Social History   Socioeconomic History   Marital status: Widowed    Spouse name: Not on file   Number of children: Not on file   Years of education: Not on file   Highest education level: Not on file  Occupational History   Not on file  Tobacco Use   Smoking status: Former   Smokeless tobacco: Never  Substance and Sexual Activity   Alcohol use: No   Drug use: Not on file   Sexual activity: Not on file  Other Topics Concern   Not on file  Social History Narrative   Not on file   Social Determinants of Health   Financial Resource Strain: Not on file  Food Insecurity: Not on file  Transportation Needs: Not on file  Physical Activity: Not on file  Stress: Not on file  Social Connections: Not on file  Intimate Partner Violence: Not on file    REVIEW OF SYSTEMS: Constitutional: Weakness, fatigue ENT:  No nose bleeds Pulm: No shortness of breath CV:  No palpitations, no LE edema.  GU:  No hematuria, no frequency GI: See HPI Heme: Other  than the tarry stools, pink emesis x1, no unusual or excessive bleeding or bruising Transfusions: None Neuro: Dizziness but no syncope.  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 Endocrine:  No sweats or chills.  No polyuria or dysuria Immunization: Not queried Travel:  None beyond local counties in last few months.    PHYSICAL EXAM: Vital signs in last 24 hours: Vitals:   01/27/21 1145 01/27/21 1230  BP: (!) 107/58 (!) 104/58  Pulse: 92 85  Resp: (!) 25 (!) 23  Temp:    SpO2: 98% 97%   Wt Readings from Last 3 Encounters:  01/20/21 76.7 kg  12/30/20 76.7 kg  08/15/20 77.1 kg    General: Ill-appearing but comfortable resting on bed in ED. Head: No facial asymmetry or swelling.  She is wearing a wig which was not removed for the exam. Eyes: No scleral icterus.  No conjunctival pallor. Ears: Not hard of hearing Nose: No congestion or discharge Mouth: No blood at the mouth. Neck: No JVD, masses, thyromegaly Lungs: Clear but diminished breath sounds bilaterally.  No labored breathing or cough Heart: RRR.  No MRG.  S1, S2 present. Abdomen: Soft without tenderness.  Active bowel sounds.  No HSM, masses, bruits, hernias.   Rectal: Black stool is rapidly 4+ FOBT positive. Musc/Skeltl: No joint redness, swelling or gross deformity. Extremities: No CCE. Neurologic: Alert.  Appropriate.  Speech is a little bit hard to understand but appropriate.  Moves all 4 limbs without tremor.  Strength not tested Skin: No rash, no sores, no suspicious lesions but did not remove the wick which I know from exam last week covers changes of pemphigus on the top of her head/scalp Nodes: No cervical adenopathy Psych: Calm, pleasant, cooperative.  Intake/Output from previous day: No intake/output data recorded. Intake/Output this shift: Total I/O In: 97.3 [IV Piggyback:97.3] Out: -   LAB RESULTS: Recent Labs    01/27/21 1050  WBC 14.3*  HGB 14.9  HCT 45.9  PLT 228   BMET Lab Results  Component Value Date   NA 137 01/27/2021   NA 138 01/24/2021   NA 134 (L) 01/23/2021   K 3.9 01/27/2021   K 4.2 01/24/2021   K 4.5 01/23/2021   CL 105 01/27/2021   CL 110 01/24/2021   CL 107 01/23/2021   CO2 22 01/27/2021   CO2 19 (L) 01/24/2021   CO2 19 (L) 01/23/2021   GLUCOSE 125 (H) 01/27/2021   GLUCOSE 82 01/24/2021   GLUCOSE 218 (H) 01/23/2021   BUN 24 (H) 01/27/2021   BUN 17 01/24/2021   BUN 18 01/23/2021   CREATININE 1.17 (H) 01/27/2021   CREATININE 0.97 01/24/2021   CREATININE 1.00 01/23/2021   CALCIUM 9.1 01/27/2021   CALCIUM 8.9 01/24/2021   CALCIUM 8.5 (L) 01/23/2021   LFT Recent Labs    01/27/21 1050  PROT 6.9  ALBUMIN 2.9*  AST 92*  ALT 153*  ALKPHOS 253*  BILITOT 1.4*   PT/INR Lab Results  Component Value Date   INR 1.4 (H) 01/24/2021   INR 1.5 (H) 01/22/2021   INR 1.6 (H) 01/21/2021    Lipase     Component Value Date/Time   LIPASE 22 01/20/2021 1500   RADIOLOGY STUDIES: DG Chest Port 1 View  Result Date: 01/27/2021 CLINICAL DATA:  GI bleed EXAM: PORTABLE CHEST 1 VIEW COMPARISON:  01/20/2021 FINDINGS: Mild cardiomegaly. No confluent opacities or effusions. No acute bony abnormality. IMPRESSION: Cardiomegaly.  No active disease. Electronically Signed   By: Rolm Baptise M.D.   On: 01/27/2021 10:44     IMPRESSION:      Upper GI bleed with tarry stools, nausea and episode of pinkish emesis x1 This is likely bleeding at the sphincterotomy site.  Overall LFTs have improved over several days  01/22/2021 ERCP with sphincterotomy, stone extraction.  As examined the stomach and duodenum were normal with the exception of small periampullary diverticulum.  Cholelithiasis.  Patient did not wish to proceed with lap chole which had been planned for this Monday.  Repeat abdominal ultrasound in process.  Chronic Coumadin for history of A. fib, DVT, IJ thrombus.  INR this morning 1.2.  Last dose of  Coumadin was PM of 7/13.  Last Lovenox injection this morning.    PLAN:        EGD, timing?Azucena Freed  01/27/2021, 1:10 PM Phone [place] Attending   I have taken an interval history, reviewed the chart and examined the patient. I agree with the Advanced Practitioner's note, impression and recommendations.    I have formulated the majority of the assessment and plan.   EGD with duodenoscope tomorrow - need to understand exact nature of bleeding given need for anticoagulation. I feel fairly certain its a post-sphincterotomy bleed.  Gatha Mayer, MD, [facility] 01/27/2021 4:54 PM

## 2021-01-27 NOTE — H&P (Signed)
History and Physical    Jill Crane MEQ:[id number] DOB: 1941-06-21 DOA: 01/27/2021  PCP: Zoila Shutter, MD (Confirm with patient/family/NH records and if not entered, this has to be entered at [facility] point of entry) Patient coming from: Home  I have personally briefly reviewed patient's old medical records in [facility]  Chief Complaint: Black stool  HPI: Jill Crane is a 80 y.o. female with medical history significant of recent cholecystitis and choledocholithiasis status post ERCP CBD stone extraction, chronic A. 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, HTN, IIDM, chronic diastolic CHF, COPD, GERD presented with black tarry stool.  Patient started to feel nauseous, but no vomiting.  He passed several times black tarry stool the last 3 days.  Today, he felt lightheaded, especially sitting up or standing up, and went to see his PCP, was found blood pressure 80/60. And patient was feeling nauseous again and vomited large amount of pinkish stomach content.  Denies any chest pain, no shortness of breath.  Patient was recently hospitalized for cholecystitis and cholangitis with CBD stone, for which she underwent ERCP and CBD stone extraction.  Patient and family however declined offer of cholecystectomy fearing about side effect.  Patient was discharged home with p.o. Augmentin which she is still taking.  Patient also has chronic A. fib, during last admission, Coumadin was held.  On discharge day 7/11, patient was restarted on Coumadin with bridging Lovenox twice daily, patient received her last dose of Lovenox this morning. She also took all her BP meds today.  ED Course: IVF started without bolus and patient's BP improved.  Hemoglobin 14.2. PPI drip started. GI consulted.  Review of Systems: As per HPI otherwise 14 point review of systems negative.    Past Medical History:  Diagnosis Date   Atrial fibrillation with normal ventricular rate (HCC)    CHF  (congestive heart failure) (HCC)    COPD (chronic obstructive pulmonary disease) (HCC)    Diabetes mellitus without complication (HCC)    Hx of blood clots    Hypertension     Past Surgical History:  Procedure Laterality Date   ENDOSCOPIC RETROGRADE CHOLANGIOPANCREATOGRAPHY (ERCP) WITH PROPOFOL N/A 01/22/2021   Procedure: ENDOSCOPIC RETROGRADE CHOLANGIOPANCREATOGRAPHY (ERCP) WITH PROPOFOL;  Surgeon: Hilarie Fredrickson, MD;  Location: [facility] ENDOSCOPY;  Service: Endoscopy;  Laterality: N/A;   REMOVAL OF STONES  01/22/2021   Procedure: REMOVAL OF STONES;  Surgeon: Hilarie Fredrickson, MD;  Location: [facility] ENDOSCOPY;  Service: Endoscopy;;   SPHINCTEROTOMY  01/22/2021   Procedure: Dennison Mascot;  Surgeon: Hilarie Fredrickson, MD;  Location: [facility] ENDOSCOPY;  Service: Endoscopy;;     reports that she has quit smoking. She has never used smokeless tobacco. She reports that she does not drink alcohol. No history on file for drug use.  No Known Allergies  Family History  Problem Relation Age of Onset   Stroke Mother    Diabetes Mother    Stroke Father      Prior to Admission medications   Medication Sig Start Date End Date Taking? Authorizing Provider  albuterol (PROVENTIL HFA;VENTOLIN HFA) 108 (90 BASE) MCG/ACT inhaler Inhale 2 puffs into the lungs every 6 (six) hours as needed for wheezing or shortness of breath.    [provider]  alendronate (FOSAMAX) 35 MG tablet Take 35 mg by mouth every Monday. Take with a full glass of water on an empty stomach.    [provider]  amoxicillin-clavulanate (AUGMENTIN) 875-125 MG tablet Take 1 tablet  by mouth 2 (two) times daily for 5 days. 01/24/21 01/29/21  DanfordEarl Lites, MD  aspirin 81 MG chewable tablet Chew 81 mg by mouth daily.    [provider]  brimonidine (ALPHAGAN) 0.2 % ophthalmic solution Place 1 drop into the left eye in the morning and at bedtime. 10/21/19   [provider]  busPIRone (BUSPAR) 10 MG tablet Take 10 mg by  mouth 2 (two) times daily. 08/04/20   [provider]  calcium-vitamin D (OSCAL WITH D) 500-200 MG-UNIT per tablet Take 1 tablet by mouth.    [provider]  dorzolamide (TRUSOPT) 2 % ophthalmic solution Place 1 drop into both eyes at bedtime. 08/03/20   [provider]  enoxaparin (LOVENOX) 80 MG/0.8ML injection Inject 0.7 mLs (70 mg total) into the skin 2 (two) times daily for 8 days. 01/24/21 02/01/21  Danford, Earl Lites, MD  furosemide (LASIX) 20 MG tablet Take 40 mg (2 tablets) tomorrow and the day after, then continue with 20 mg daily 04/15/17   Arthor Captain, PA-C  gabapentin (NEURONTIN) 300 MG capsule Take 300 mg by mouth at bedtime. 08/10/20   [provider]  magnesium oxide (MAG-OX) 400 (241.3 Mg) MG tablet Take 400 mg by mouth daily. 07/08/20   [provider]  MATZIM LA 240 MG 24 hr tablet Take 240 mg by mouth daily. 08/03/20   [provider]  metFORMIN (GLUCOPHAGE) 500 MG tablet Take 500-1,000 mg by mouth See admin instructions. Take 2 tablets with morning meal, and 1 tablet with evening meals 08/24/20   [provider]  metoprolol tartrate (LOPRESSOR) 25 MG tablet Take 1 tablet (25 mg total) by mouth 2 (two) times daily. 08/17/20   Rhetta Mura, MD  omeprazole (PRILOSEC) 20 MG capsule Take 20 mg by mouth daily.    [provider]  polyethylene glycol powder (GLYCOLAX/MIRALAX) 17 GM/SCOOP powder Take 17 g by mouth daily as needed for constipation.    [provider]  predniSONE (DELTASONE) 2.5 MG tablet Take 10 mg by mouth daily with breakfast.    [provider]  silver sulfADIAZINE (SILVADENE) 1 % cream Apply 1 application topically daily as needed for irritation. to scalp 06/16/20   [provider]  warfarin (COUMADIN) 2 MG tablet Take 2-4 mg by mouth See admin instructions. Take 1 tablet (2 mg) daily except Take 2 tablets (4 mg) on (Tues & Fri) 06/02/20   [provider]     Physical Exam: Vitals:   01/27/21 1031 01/27/21 1145  BP: 126/66 (!) 107/58  Pulse: 72 92  Resp: 16 (!) 25  Temp: 98.5 F (36.9 C)   TempSrc: Oral   SpO2: 96% 98%    Constitutional: NAD, calm, comfortable Vitals:   01/27/21 1031 01/27/21 1145  BP: 126/66 (!) 107/58  Pulse: 72 92  Resp: 16 (!) 25  Temp: 98.5 F (36.9 C)   TempSrc: Oral   SpO2: 96% 98%   Eyes: PERRL, lids and conjunctivae normal ENMT: Mucous membranes are moist. Posterior pharynx clear of any exudate or lesions.Normal dentition.  Neck: normal, supple, no masses, no thyromegaly Respiratory: clear to auscultation bilaterally, no wheezing, no crackles. Normal respiratory effort. No accessory muscle use.  Cardiovascular: Regular rate and rhythm, no murmurs / rubs / gallops. No extremity edema. 2+ pedal pulses. No carotid bruits.  Abdomen: mild tenderness on epigastric area, no rebound or guarding, no masses palpated. No hepatosplenomegaly. Bowel sounds positive.  Musculoskeletal: no clubbing / cyanosis. No joint deformity  upper and lower extremities. Good ROM, no contractures. Normal muscle tone.  Skin: no rashes, lesions, ulcers. No induration Neurologic: CN 2-12 grossly intact. Sensation intact, DTR normal. Strength 5/5 in all 4.  Psychiatric: Normal judgment and insight. Alert and oriented x 3. Normal mood.     Labs on Admission: I have personally reviewed following labs and imaging studies  CBC: Recent Labs  Lab 01/20/21 1500 01/21/21 0226 01/22/21 0518 01/23/21 0922 01/24/21 0502 01/27/21 1050  WBC 14.4* 12.0* 12.3* 13.0* 11.4* 14.3*  NEUTROABS 11.9*  --   --   --   --  10.2*  HGB 14.2 14.5 14.0 13.0 13.7 14.9  HCT 42.1 44.6 41.7 40.8 42.1 45.9  MCV 86.4 87.3 86.7 88.5 87.9 88.8  PLT 182 154 152 177 182 228   Basic Metabolic Panel: Recent Labs  Lab 01/21/21 0158 01/22/21 0518 01/23/21 0922 01/24/21 0502 01/27/21 1050  NA 136 137 134* 138 137  K 4.1 4.1 4.5 4.2 3.9  CL 105 109 107  110 105  CO2 18* 20* 19* 19* 22  GLUCOSE 67* 107* 218* 82 125*  BUN 18 16 18 17  24*  CREATININE 1.56* 1.03* 1.00 0.97 1.17*  CALCIUM 9.1 8.7* 8.5* 8.9 9.1   GFR: Estimated Creatinine Clearance: 39.9 mL/min (A) (by C-G formula based on SCr of 1.17 mg/dL (H)). Liver Function Tests: Recent Labs  Lab 01/21/21 0158 01/22/21 0518 01/23/21 0922 01/24/21 0502 01/27/21 1050  AST 247* 76* 40 34 92*  ALT 309* 183* 122* 99* 153*  ALKPHOS 173* 140* 123 110 253*  BILITOT 3.0* 0.9 0.7 0.8 1.4*  PROT 6.4* 6.5 6.1* 6.0* 6.9  ALBUMIN 3.1* 2.7* 2.6* 2.6* 2.9*   Recent Labs  Lab 01/20/21 1500  LIPASE 22   No results for input(s): AMMONIA in the last 168 hours. Coagulation Profile: Recent Labs  Lab 01/20/21 1500 01/21/21 0226 01/22/21 0518 01/24/21 0502  INR 1.8* 1.6* 1.5* 1.4*   Cardiac Enzymes: No results for input(s): CKTOTAL, CKMB, CKMBINDEX, TROPONINI in the last 168 hours. BNP (last 3 results) No results for input(s): PROBNP in the last 8760 hours. HbA1C: No results for input(s): HGBA1C in the last 72 hours. CBG: Recent Labs  Lab 01/23/21 2018 01/24/21 0007 01/24/21 0422 01/24/21 0751 01/24/21 1115  GLUCAP 165* 105* 76 75 77   Lipid Profile: No results for input(s): CHOL, HDL, LDLCALC, TRIG, CHOLHDL, LDLDIRECT in the last 72 hours. Thyroid Function Tests: No results for input(s): TSH, T4TOTAL, FREET4, T3FREE, THYROIDAB in the last 72 hours. Anemia Panel: No results for input(s): VITAMINB12, FOLATE, FERRITIN, TIBC, IRON, RETICCTPCT in the last 72 hours. Urine analysis:    Component Value Date/Time   COLORURINE YELLOW 01/20/2021 1700   APPEARANCEUR CLEAR 01/20/2021 1700   LABSPEC 1.010 01/20/2021 1700   PHURINE 8.0 01/20/2021 1700   GLUCOSEU NEGATIVE 01/20/2021 1700   HGBUR TRACE (A) 01/20/2021 1700   BILIRUBINUR NEGATIVE 01/20/2021 1700   KETONESUR NEGATIVE 01/20/2021 1700   PROTEINUR NEGATIVE 01/20/2021 1700   UROBILINOGEN 1.0 08/16/2014 0423   NITRITE  NEGATIVE 01/20/2021 1700   LEUKOCYTESUR NEGATIVE 01/20/2021 1700    Radiological Exams on Admission: DG Chest Port 1 View  Result Date: 01/27/2021 CLINICAL DATA:  GI bleed EXAM: PORTABLE CHEST 1 VIEW COMPARISON:  01/20/2021 FINDINGS: Mild cardiomegaly. No confluent opacities or effusions. No acute bony abnormality. IMPRESSION: Cardiomegaly.  No active disease. Electronically Signed   By: Charlett NoseKevin  Dover M.D.   On: 01/27/2021 10:44    EKG: Independently reviewed. Chronic  afib  Assessment/Plan Active Problems:   GI bleed  (please populate well all problems here in Problem List. (For example, if patient is on BP meds at home and you resume or decide to hold them, it is a problem that needs to be her. Same for CAD, COPD, HLD and so on)  GI bleed -Mostly lower GI bleed +1 episode of pinkish hematemesis today.  Differential: peri-operative bleeding related to recent procedure of ERCP and sphincterectomy.  Continue PPI drip.  gastric ulcer bleeding less likely given a rather negative stomach and duodenum finding on recent endoscopy/ERCP.  GI is to see this patient this afternoon. -No chemical DVT prophylaxis. Hb level at baseline, will not reverse lovenox at this point.  Leukocytosis -Along with increased LFTs, recurrent transaminitis, bilirubinemia and elevated alkaline phosphatase, physical exam, tenderness location not clear count epigastric versus RUQ, all raised the concern about recurrent cholecystitis and cholangitis, will escalate antibiotics to Unasyn. -RUQ ultrasound.  Recurrent transaminitis, bilirubinemia -As above  HTN -Hold BP meds  Chronic A. Fib -Change metoprolol to as needed for HR>110 -Hold stomach anticoagulation.  IIDM -Continue Metformin -Sliding scale  Pemphigus vulgaris -Double dose of home prednisone   DVT prophylaxis: SCD Code Status: Full Code Family Communication: Daughter Disposition Plan: Expect more than 2 midnight hospital stay Consults called:  GI Admission status: PCU   Emeline General MD [facility] Pager [phone]  01/27/2021, 12:46 PM

## 2021-01-27 NOTE — H&P (View-Only) (Signed)
[facility] Consult: 1:10 PM 01/27/2021  LOS: 0 days    Referring Provider: ED MD  Gilford Raid Primary Care Physician:  Burman Freestone, MD Primary Gastroenterologist:  Dr Aurelio Brash    Reason for Consultation: Melena in setting of Lovenox, Coumadin and recent ERCP, sphincterotomy, stone removed   HPI: Jill Crane is a 80 y.o. female.  Past medical history A. fib.  DVT, right IJ thrombus 2014.  Chronic Coumadin.  COPD.  Systolic/diastolic heart failure.  DM2.  Gallstones, gallbladder polyps, nondilated biliary tree per ultrasound 11/2019.  Fatty liver, hepatic cyst per ultrasound 2014.  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  HCAP/sepsis 2014 requiring HD for AKI. 03/2010 colonoscopy.  Polyps removed, pathology tubular adenoma.  Procedure report not found 03/2010 EGD.  Pathology of small bowel biopsies benign, no changes of sprue.  Procedure report not found.  Evaluated during recent hospitalization by GI on 7/8 for choledocholithiasis. 01/22/2021 ERCP: Underwent sphincterotomy and stone extraction.  Stomach, duodenum grossly normal.  Small periampullary diverticulum. Surgical plan was lap chole but patient declined surgery so she was discharged home 7/11 after 5-day admission.  Her plan was to see how things would progress and perhaps have surgery at a future date.  Coumadin was restarted but she was provided with Lovenox window both of which she continues to take.  Plan was to finish 5-day course of Augmentin.  Since discharge on Monday and return home she has noticed tarry, black stools.  Still experiencing generalized abdominal pain and nausea.  Dizzy/lightheaded when standing.  Went for check of INR at MD office today, it was 1.2 but she had a bout of pinkish emesis followed by clear, phlegmy  emesis and was sent to the ED.  Hypotension of 80/60 at arrival to ED, improved after 500 mL bolus  Hgb 14.9, was 14.5  - 13 during recent admission T bili 1.4.  Alk phos 253.  AST/ALT 92/153.  These compare with max readings of 3.0, 187, 442/383 max levels on 7/8  Took her Coumadin dose last night, injected Lovenox this morning.    Past Medical History:  Diagnosis Date   Atrial fibrillation with normal ventricular rate (HCC)    CHF (congestive heart failure) (HCC)    COPD (chronic obstructive pulmonary disease) ([place])    Diabetes mellitus without complication ([place])    Hx of blood clots    Hypertension     Past Surgical History:  Procedure Laterality Date   ENDOSCOPIC RETROGRADE CHOLANGIOPANCREATOGRAPHY (ERCP) WITH PROPOFOL N/A 01/22/2021   Procedure: ENDOSCOPIC RETROGRADE CHOLANGIOPANCREATOGRAPHY (ERCP) WITH PROPOFOL;  Surgeon: Irene Shipper, MD;  Location: [facility];  Service: Endoscopy;  Laterality: N/A;   REMOVAL OF STONES  01/22/2021   Procedure: REMOVAL OF STONES;  Surgeon: Irene Shipper, MD;  Location: [facility] ENDOSCOPY;  Service: Endoscopy;;   SPHINCTEROTOMY  01/22/2021   Procedure: Joan Mayans;  Surgeon: Irene Shipper, MD;  Location: [facility] ENDOSCOPY;  Service: Endoscopy;;    Prior to Admission medications   Medication Sig Start Date End Date Taking? Authorizing Provider  albuterol (PROVENTIL HFA;VENTOLIN HFA) 108 (90 BASE) MCG/ACT inhaler Inhale 2 puffs into the lungs every 6 (six) hours as needed for wheezing or shortness of breath.    [provider]  alendronate (FOSAMAX) 35 MG tablet Take 35 mg by mouth every Monday. Take with a full glass of water on an empty stomach.    [provider]  amoxicillin-clavulanate (AUGMENTIN) 875-125 MG tablet Take 1 tablet by mouth 2 (two) times daily for 5 days. 01/24/21 01/29/21  Danford, Christopher P, MD  aspirin 81 MG chewable tablet Chew 81 mg by mouth daily.    [provider]  brimonidine (ALPHAGAN) 0.2 % ophthalmic  solution Place 1 drop into the left eye in the morning and at bedtime. 10/21/19   [provider]  busPIRone (BUSPAR) 10 MG tablet Take 10 mg by mouth 2 (two) times daily. 08/04/20   [provider]  calcium-vitamin D (OSCAL WITH D) 500-200 MG-UNIT per tablet Take 1 tablet by mouth.    [provider]  dorzolamide (TRUSOPT) 2 % ophthalmic solution Place 1 drop into both eyes at bedtime. 08/03/20   [provider]  enoxaparin (LOVENOX) 80 MG/0.8ML injection Inject 0.7 mLs (70 mg total) into the skin 2 (two) times daily for 8 days. 01/24/21 02/01/21  Danford, Christopher P, MD  furosemide (LASIX) 20 MG tablet Take 40 mg (2 tablets) tomorrow and the day after, then continue with 20 mg daily 04/15/17   Harris, Abigail, PA-C  gabapentin (NEURONTIN) 300 MG capsule Take 300 mg by mouth at bedtime. 08/10/20   [provider]  magnesium oxide (MAG-OX) 400 (241.3 Mg) MG tablet Take 400 mg by mouth daily. 07/08/20   [provider]  MATZIM LA 240 MG 24 hr tablet Take 240 mg by mouth daily. 08/03/20   [provider]  metFORMIN (GLUCOPHAGE) 500 MG tablet Take 500-1,000 mg by mouth See admin instructions. Take 2 tablets with morning meal, and 1 tablet with evening meals 08/24/20   [provider]  metoprolol tartrate (LOPRESSOR) 25 MG tablet Take 1 tablet (25 mg total) by mouth 2 (two) times daily. 08/17/20   Samtani, Jai-Gurmukh, MD  omeprazole (PRILOSEC) 20 MG capsule Take 20 mg by mouth daily.    [provider]  polyethylene glycol powder (GLYCOLAX/MIRALAX) 17 GM/SCOOP powder Take 17 g by mouth daily as needed for constipation.    [provider]  predniSONE (DELTASONE) 2.5 MG tablet Take 10 mg by mouth daily with breakfast.    [provider]  silver sulfADIAZINE (SILVADENE) 1 % cream Apply 1 application topically daily as needed for irritation. to scalp 06/16/20   [provider]  warfarin (COUMADIN) 2 MG tablet  Take 2-4 mg by mouth See admin instructions. Take 1 tablet (2 mg) daily except Take 2 tablets (4 mg) on (Tues & Fri) 06/02/20   [provider]    Scheduled Meds:  aspirin  81 mg Oral Daily   brimonidine  1 drop Left Eye TID   busPIRone  10 mg Oral BID   [START ON 01/28/2021] calcium-vitamin D  1 tablet Oral Q breakfast   dorzolamide  1 drop Both Eyes QHS   gabapentin  300 mg Oral QHS   insulin aspart  0-9 Units Subcutaneous TID WC   magnesium oxide  400 mg Oral Daily   metFORMIN  500 mg Oral BID WC   [START ON 01/28/2021] predniSONE  20 mg Oral Q breakfast   Infusions:  sodium chloride 125 mL/hr   at 01/27/21 1045   pantoprazole 8 mg/hr (01/27/21 1238)   PRN Meds: albuterol, metoprolol tartrate, ondansetron **OR** ondansetron (ZOFRAN) IV, polyethylene glycol   Allergies as of 01/27/2021   (No Known Allergies)    Family History  Problem Relation Age of Onset   Stroke Mother    Diabetes Mother    Stroke Father     Social History   Socioeconomic History   Marital status: Widowed    Spouse name: Not on file   Number of children: Not on file   Years of education: Not on file   Highest education level: Not on file  Occupational History   Not on file  Tobacco Use   Smoking status: Former   Smokeless tobacco: Never  Substance and Sexual Activity   Alcohol use: No   Drug use: Not on file   Sexual activity: Not on file  Other Topics Concern   Not on file  Social History Narrative   Not on file   Social Determinants of Health   Financial Resource Strain: Not on file  Food Insecurity: Not on file  Transportation Needs: Not on file  Physical Activity: Not on file  Stress: Not on file  Social Connections: Not on file  Intimate Partner Violence: Not on file    REVIEW OF SYSTEMS: Constitutional: Weakness, fatigue ENT:  No nose bleeds Pulm: No shortness of breath CV:  No palpitations, no LE edema.  GU:  No hematuria, no frequency GI: See HPI Heme: Other  than the tarry stools, pink emesis x1, no unusual or excessive bleeding or bruising Transfusions: None Neuro: Dizziness but no syncope.  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 Endocrine:  No sweats or chills.  No polyuria or dysuria Immunization: Not queried Travel:  None beyond local counties in last few months.    PHYSICAL EXAM: Vital signs in last 24 hours: Vitals:   01/27/21 1145 01/27/21 1230  BP: (!) 107/58 (!) 104/58  Pulse: 92 85  Resp: (!) 25 (!) 23  Temp:    SpO2: 98% 97%   Wt Readings from Last 3 Encounters:  01/20/21 76.7 kg  12/30/20 76.7 kg  08/15/20 77.1 kg    General: Ill-appearing but comfortable resting on bed in ED. Head: No facial asymmetry or swelling.  She is wearing a wig which was not removed for the exam. Eyes: No scleral icterus.  No conjunctival pallor. Ears: Not hard of hearing Nose: No congestion or discharge Mouth: No blood at the mouth. Neck: No JVD, masses, thyromegaly Lungs: Clear but diminished breath sounds bilaterally.  No labored breathing or cough Heart: RRR.  No MRG.  S1, S2 present. Abdomen: Soft without tenderness.  Active bowel sounds.  No HSM, masses, bruits, hernias.   Rectal: Black stool is rapidly 4+ FOBT positive. Musc/Skeltl: No joint redness, swelling or gross deformity. Extremities: No CCE. Neurologic: Alert.  Appropriate.  Speech is a little bit hard to understand but appropriate.  Moves all 4 limbs without tremor.  Strength not tested Skin: No rash, no sores, no suspicious lesions but did not remove the wick which I know from exam last week covers changes of pemphigus on the top of her head/scalp Nodes: No cervical adenopathy Psych: Calm, pleasant, cooperative.  Intake/Output from previous day: No intake/output data recorded. Intake/Output this shift: Total I/O In: 97.3 [IV Piggyback:97.3] Out: -   LAB RESULTS: Recent Labs    01/27/21 1050  WBC 14.3*  HGB 14.9  HCT 45.9  PLT 228   BMET Lab Results  Component Value Date   NA 137 01/27/2021   NA 138 01/24/2021   NA 134 (L) 01/23/2021   K 3.9 01/27/2021   K 4.2 01/24/2021   K 4.5 01/23/2021   CL 105 01/27/2021   CL 110 01/24/2021   CL 107 01/23/2021   CO2 22 01/27/2021   CO2 19 (L) 01/24/2021   CO2 19 (L) 01/23/2021   GLUCOSE 125 (H) 01/27/2021   GLUCOSE 82 01/24/2021   GLUCOSE 218 (H) 01/23/2021   BUN 24 (H) 01/27/2021   BUN 17 01/24/2021   BUN 18 01/23/2021   CREATININE 1.17 (H) 01/27/2021   CREATININE 0.97 01/24/2021   CREATININE 1.00 01/23/2021   CALCIUM 9.1 01/27/2021   CALCIUM 8.9 01/24/2021   CALCIUM 8.5 (L) 01/23/2021   LFT Recent Labs    01/27/21 1050  PROT 6.9  ALBUMIN 2.9*  AST 92*  ALT 153*  ALKPHOS 253*  BILITOT 1.4*   PT/INR Lab Results  Component Value Date   INR 1.4 (H) 01/24/2021   INR 1.5 (H) 01/22/2021   INR 1.6 (H) 01/21/2021    Lipase     Component Value Date/Time   LIPASE 22 01/20/2021 1500   RADIOLOGY STUDIES: DG Chest Port 1 View  Result Date: 01/27/2021 CLINICAL DATA:  GI bleed EXAM: PORTABLE CHEST 1 VIEW COMPARISON:  01/20/2021 FINDINGS: Mild cardiomegaly. No confluent opacities or effusions. No acute bony abnormality. IMPRESSION: Cardiomegaly.  No active disease. Electronically Signed   By: Rolm Baptise M.D.   On: 01/27/2021 10:44     IMPRESSION:      Upper GI bleed with tarry stools, nausea and episode of pinkish emesis x1 This is likely bleeding at the sphincterotomy site.  Overall LFTs have improved over several days  01/22/2021 ERCP with sphincterotomy, stone extraction.  As examined the stomach and duodenum were normal with the exception of small periampullary diverticulum.  Cholelithiasis.  Patient did not wish to proceed with lap chole which had been planned for this Monday.  Repeat abdominal ultrasound in process.  Chronic Coumadin for history of A. fib, DVT, IJ thrombus.  INR this morning 1.2.  Last dose of  Coumadin was PM of 7/13.  Last Lovenox injection this morning.    PLAN:        EGD, timing?Azucena Freed  01/27/2021, 1:10 PM Phone [place] Attending   I have taken an interval history, reviewed the chart and examined the patient. I agree with the Advanced Practitioner's note, impression and recommendations.    I have formulated the majority of the assessment and plan.   EGD with duodenoscope tomorrow - need to understand exact nature of bleeding given need for anticoagulation. I feel fairly certain its a post-sphincterotomy bleed.  Gatha Mayer, MD, [facility] 01/27/2021 4:54 PM

## 2021-01-27 NOTE — ED Notes (Signed)
Called [facility] for transport, unsure of timeline for moving.

## 2021-01-28 ENCOUNTER — Encounter (HOSPITAL_COMMUNITY): Payer: Self-pay | Admitting: Internal Medicine

## 2021-01-28 ENCOUNTER — Inpatient Hospital Stay (HOSPITAL_COMMUNITY): Payer: Medicare Other | Admitting: Anesthesiology

## 2021-01-28 ENCOUNTER — Encounter (HOSPITAL_COMMUNITY)
Admission: EM | Disposition: A | Discharge status: Home / Self Care | Payer: Self-pay | Source: Home / Self Care | Attending: Emergency Medicine

## 2021-01-28 DIAGNOSIS — I10 Essential (primary) hypertension: Secondary | ICD-10-CM | POA: Diagnosis not present

## 2021-01-28 DIAGNOSIS — N179 Acute kidney failure, unspecified: Secondary | ICD-10-CM

## 2021-01-28 DIAGNOSIS — I110 Hypertensive heart disease with heart failure: Secondary | ICD-10-CM | POA: Diagnosis not present

## 2021-01-28 DIAGNOSIS — Z20822 Contact with and (suspected) exposure to covid-19: Secondary | ICD-10-CM | POA: Diagnosis not present

## 2021-01-28 DIAGNOSIS — K922 Gastrointestinal hemorrhage, unspecified: Secondary | ICD-10-CM

## 2021-01-28 DIAGNOSIS — K3189 Other diseases of stomach and duodenum: Secondary | ICD-10-CM | POA: Diagnosis not present

## 2021-01-28 DIAGNOSIS — E119 Type 2 diabetes mellitus without complications: Secondary | ICD-10-CM | POA: Diagnosis not present

## 2021-01-28 DIAGNOSIS — I5032 Chronic diastolic (congestive) heart failure: Secondary | ICD-10-CM | POA: Diagnosis present

## 2021-01-28 DIAGNOSIS — I482 Chronic atrial fibrillation, unspecified: Secondary | ICD-10-CM

## 2021-01-28 DIAGNOSIS — K219 Gastro-esophageal reflux disease without esophagitis: Secondary | ICD-10-CM | POA: Diagnosis present

## 2021-01-28 HISTORY — PX: ESOPHAGOGASTRODUODENOSCOPY (EGD) WITH PROPOFOL: SHX5813

## 2021-01-28 LAB — HEPATIC FUNCTION PANEL
ALT: 93 U/L — ABNORMAL HIGH (ref 0–44)
AST: 45 U/L — ABNORMAL HIGH (ref 15–41)
Albumin: 2.4 g/dL — ABNORMAL LOW (ref 3.5–5.0)
Alkaline Phosphatase: 166 U/L — ABNORMAL HIGH (ref 38–126)
Bilirubin, Direct: 0.2 mg/dL (ref 0.0–0.2)
Indirect Bilirubin: 0.9 mg/dL (ref 0.3–0.9)
Total Bilirubin: 1.1 mg/dL (ref 0.3–1.2)
Total Protein: 5.5 g/dL — ABNORMAL LOW (ref 6.5–8.1)

## 2021-01-28 LAB — CBC
HCT: 36.7 % (ref 36.0–46.0)
HCT: 37.8 % (ref 36.0–46.0)
Hemoglobin: 11.9 g/dL — ABNORMAL LOW (ref 12.0–15.0)
Hemoglobin: 11.8 g/dL — ABNORMAL LOW (ref 12.0–15.0)
MCH: 28.5 pg (ref 26.0–34.0)
MCH: 28.3 pg (ref 26.0–34.0)
MCHC: 32.4 g/dL (ref 30.0–36.0)
MCHC: 31.2 g/dL (ref 30.0–36.0)
MCV: 88 fL (ref 80.0–100.0)
MCV: 90.6 fL (ref 80.0–100.0)
Platelets: 204 10*3/uL (ref 150–400)
Platelets: 187 10*3/uL (ref 150–400)
RBC: 4.17 MIL/uL (ref 3.87–5.11)
RBC: 4.17 MIL/uL (ref 3.87–5.11)
RDW: 16 % — ABNORMAL HIGH (ref 11.5–15.5)
RDW: 16.5 % — ABNORMAL HIGH (ref 11.5–15.5)
WBC: 10.3 10*3/uL (ref 4.0–10.5)
WBC: 8.8 10*3/uL (ref 4.0–10.5)
nRBC: 0 % (ref 0.0–0.2)
nRBC: 0 % (ref 0.0–0.2)

## 2021-01-28 LAB — BASIC METABOLIC PANEL
Anion gap: 9 (ref 5–15)
BUN: 12 mg/dL (ref 8–23)
CO2: 19 mmol/L — ABNORMAL LOW (ref 22–32)
Calcium: 8.3 mg/dL — ABNORMAL LOW (ref 8.9–10.3)
Chloride: 111 mmol/L (ref 98–111)
Creatinine, Ser: 0.93 mg/dL (ref 0.44–1.00)
GFR, Estimated: >60 mL/min (ref >60)
Glucose, Bld: 122 mg/dL — ABNORMAL HIGH (ref 70–99)
Potassium: 4.2 mmol/L (ref 3.5–5.1)
Sodium: 139 mmol/L (ref 135–145)

## 2021-01-28 LAB — GLUCOSE, CAPILLARY
Glucose-Capillary: 86 mg/dL (ref 70–99)
Glucose-Capillary: 124 mg/dL — ABNORMAL HIGH (ref 70–99)
Glucose-Capillary: 116 mg/dL — ABNORMAL HIGH (ref 70–99)

## 2021-01-28 LAB — PROTIME-INR
INR: 1.1 (ref 0.8–1.2)
Prothrombin Time: 13.8 seconds (ref 11.4–15.2)

## 2021-01-28 SURGERY — ESOPHAGOGASTRODUODENOSCOPY (EGD) WITH PROPOFOL
Anesthesia: Monitor Anesthesia Care

## 2021-01-28 MED ORDER — PHENOL 1.4 % MT LIQD
1.0000 | OROMUCOSAL | Status: DC | PRN
  Filled 2021-01-28: qty 177

## 2021-01-28 MED ORDER — WARFARIN SODIUM 2 MG PO TABS: ORAL_TABLET | ORAL | 1 refills | Status: AC

## 2021-01-28 MED ORDER — FUROSEMIDE 20 MG PO TABS: 20.0000 mg | ORAL_TABLET | Freq: Every day | ORAL | Status: AC

## 2021-01-28 MED ORDER — WARFARIN SODIUM 6 MG PO TABS
6.0000 mg | ORAL_TABLET | Freq: Once | ORAL | Status: AC
  Administered 2021-01-28: 6 mg via ORAL
  Filled 2021-01-28: qty 1

## 2021-01-28 MED ORDER — LACTATED RINGERS IV SOLN: INTRAVENOUS | Status: DC

## 2021-01-28 MED ORDER — PROPOFOL 500 MG/50ML IV EMUL
INTRAVENOUS | Status: DC | PRN
  Administered 2021-01-28: 150 ug/kg/min via INTRAVENOUS

## 2021-01-28 MED ORDER — ACETAMINOPHEN 325 MG PO TABS
650.0000 mg | ORAL_TABLET | Freq: Four times a day (QID) | ORAL | Status: DC | PRN
  Administered 2021-01-28: 650 mg via ORAL
  Filled 2021-01-28: qty 2

## 2021-01-28 MED ORDER — NYSTATIN 100000 UNIT/GM EX POWD
Freq: Two times a day (BID) | CUTANEOUS | Status: DC
  Filled 2021-01-28: qty 15

## 2021-01-28 MED ORDER — WARFARIN - PHARMACIST DOSING INPATIENT: Freq: Every day | Status: DC

## 2021-01-28 SURGICAL SUPPLY — 15 items

## 2021-01-28 NOTE — Brief Op Note (Signed)
01/27/2021 - 01/28/2021  9:52 AM  PATIENT:  Jill Crane  80 y.o. female  PRE-OPERATIVE DIAGNOSIS:  melena, pinkish emesis, ercp sphincterotomy last week.  on coumadin and lovenox PTA.  POST-OPERATIVE DIAGNOSIS:  post sphicterotomy ulceration- no tx   PROCEDURE:  Procedure(s): ESOPHAGOGASTRODUODENOSCOPY (EGD) WITH PROPOFOL (N/A)  SURGEON:  Surgeon(s) and Role:    * Iva Boop, MD - Primary   Appropriate post-sphincterotomy changes - likely bled from here  Recommend that she resume warfarin with extra doses to accelerate therapeutic dose and avoid lovenox this time  Treat gallbladder issues as per dc plan  Home today is ok by me  Full note to follow

## 2021-01-28 NOTE — Discharge Summary (Signed)
Discharge Summary  Jill Crane GNF:[id number] DOB: [birth year]  PCP: Zoila Shutter, MD  Admit date: 01/27/2021 Discharge date: 01/28/2021  Time spent: 40 minutes  Recommendations for Outpatient Follow-up:  Medication change: Patient normally takes 4 mg of Coumadin on Tuesdays and Fridays and 2 mg the rest of the days of the week.  She will take 4 mg on Saturday and Sunday, the 16th and 17th and then have her Coumadin level checked on Monday, the 18th.  Discharge Diagnoses:  Active Hospital Problems   Diagnosis Date Noted   AKI (acute kidney injury) (HCC) 01/28/2021   Chronic diastolic CHF (congestive heart failure) (HCC)    GERD (gastroesophageal reflux disease)    Acute upper GI bleed 01/27/2021   Cholecystitis 01/20/2021   Essential hypertension 08/15/2020   Diabetes (HCC) 08/15/2020   A-fib (HCC) 08/15/2020    Resolved Hospital Problems  No resolved problems to display.    Discharge Condition: Improved, being discharged home  Diet recommendation: Carb modified, low-sodium  Vitals:   01/28/21 1000 01/28/21 1241  BP: 124/67 121/77  Pulse: 90 100  Resp: 19 18  Temp:  98.2 F (36.8 C)  SpO2: 98% 99%    History of present illness:  80 year old female with past medical history of recent cholecystitis/choledocholithiasis status post ERCP and CBD stone extraction (discharged from hospital on 7/11) returned back on 7/14 with black tarry stool, which has been sometimes over the past 2 days since discharge.  MDM presentation, patient felt lightheaded dizzy dizzy.  Blood pressure found to be 80/64.  Sent over to emergency room for further evaluation.  No discharge.  7/11, patient has been restarted on Coumadin.  Lovenox twice daily inpatient and received last dose of Lovenox.  Presentation.  Patient was given IV fluids and BP improved.  Hemoglobin 14.2.  GI consulted and patient admitted to hospitalist service.  Hospital Course:  Active Problems:   A-fib [facility]): Rate  controlled.  See below for Coumadin dosing   Diabetes (HCC): CBG stable   Essential hypertension: Blood pressure stable.    Cholecystitis: Family has been resistant to getting cholecystectomy and patient has been managed without.   Acute upper GI bleed: Principal problem.  Patient underwent EGD by GI.  They found ulceration at the sphincter area.  GI felt the cause of bleeding was due to patient being restarted on her Coumadin with Lovenox bridge.  They recommend that it would be safer for her this time to restart Coumadin at higher doses to get her to her therapeutic range more quickly, but with no Lovenox bridge.  Currently patient's hemoglobin is stable.  Her hemoglobin on late morning of 7/15 was 11.8, unchanged from 15 hours prior.  Plan will be for patient to resume Coumadin getting 6 mg today at 4 PM (normally on her home regimen, she may be taking 4 mg tonight), and then she would take 4 mg tomorrow, Saturday, 7/16 and 4 mg on Sunday, 7/17 (normally she would be taking 2 mg on each of these days) and then she will have her levels checked on Monday, 7/17.    AKI (acute kidney injury) (HCC): Mild, secondary to acute blood loss.  Resolved with fluids.   Chronic diastolic CHF (congestive heart failure) (HCC): Remains euvolemic.    GERD (gastroesophageal reflux disease): Continue PPI   Procedures: Status post EGD done 7/15  Consultations: Gastroenterology  Discharge Exam: BP 121/77 (BP Location: Right Arm)   Pulse 100   Temp 98.2 F (36.8 C) (Oral)  Resp 18   Ht 5\' 5"  (1.651 m)   Wt 72.2 kg   SpO2 99%   BMI 26.49 kg/m   General: Alert and oriented x2, no acute distress Cardiovascular: Irregular rhythm, rate controlled Respiratory: Clear to auscultation bilaterally  Discharge Instructions You were cared for by a hospitalist during your hospital stay. If you have any questions about your discharge medications or the care you received while you were in the hospital after you  are discharged, you can call the unit and asked to speak with the hospitalist on call if the hospitalist that took care of you is not available. Once you are discharged, your primary care physician will handle any further medical issues. Please note that NO REFILLS for any discharge medications will be authorized once you are discharged, as it is imperative that you return to your primary care physician (or establish a relationship with a primary care physician if you do not have one) for your aftercare needs so that they can reassess your need for medications and monitor your lab values.  Discharge Instructions     Diet - low sodium heart healthy   Complete by: As directed    Increase activity slowly   Complete by: As directed       Allergies as of 01/28/2021   No Known Allergies      Medication List     STOP taking these medications    enoxaparin 80 MG/0.8ML injection Commonly known as: LOVENOX       TAKE these medications    albuterol 108 (90 Base) MCG/ACT inhaler Commonly known as: VENTOLIN HFA Inhale 2 puffs into the lungs every 6 (six) hours as needed for wheezing or shortness of breath.   alendronate 35 MG tablet Commonly known as: FOSAMAX Take 35 mg by mouth every Monday. Take with a full glass of water on an empty stomach.   amoxicillin-clavulanate 875-125 MG tablet Commonly known as: Augmentin Take 1 tablet by mouth 2 (two) times daily for 5 days.   aspirin 81 MG chewable tablet Chew 81 mg by mouth daily.   brimonidine 0.2 % ophthalmic solution Commonly known as: ALPHAGAN Place 1 drop into the left eye in the morning and at bedtime.   busPIRone 10 MG tablet Commonly known as: BUSPAR Take 10 mg by mouth 2 (two) times daily.   calcium-vitamin D 500-200 MG-UNIT tablet Commonly known as: OSCAL WITH D Take 1 tablet by mouth.   dorzolamide 2 % ophthalmic solution Commonly known as: TRUSOPT Place 1 drop into both eyes at bedtime.   furosemide 20 MG  tablet Commonly known as: LASIX Take 1 tablet (20 mg total) by mouth daily.   gabapentin 300 MG capsule Commonly known as: NEURONTIN Take 300 mg by mouth at bedtime.   magnesium oxide 400 (241.3 Mg) MG tablet Commonly known as: MAG-OX Take 400 mg by mouth daily.   Matzim LA 240 MG 24 hr tablet Generic drug: diltiazem Take 240 mg by mouth daily.   metFORMIN 500 MG tablet Commonly known as: GLUCOPHAGE Take 500-1,000 mg by mouth See admin instructions. Take 2 tablets with morning meal, and 1 tablet with evening meals   metoprolol tartrate 25 MG tablet Commonly known as: LOPRESSOR Take 1 tablet (25 mg total) by mouth 2 (two) times daily.   omeprazole 20 MG capsule Commonly known as: PRILOSEC Take 20 mg by mouth daily.   polyethylene glycol powder 17 GM/SCOOP powder Commonly known as: GLYCOLAX/MIRALAX Take 17 g by mouth daily as  needed for constipation.   predniSONE 2.5 MG tablet Commonly known as: DELTASONE Take 10 mg by mouth daily with breakfast.   silver sulfADIAZINE 1 % cream Commonly known as: SILVADENE Apply 1 application topically daily as needed for irritation. to scalp   warfarin 2 MG tablet Commonly known as: COUMADIN On Saturday and Sunday, take 2 tabs (4mg ), then resume normal regimen Take 1 tablet (2 mg) daily except Take 2 tablets (4 mg) on (Tues & Fri) What changed:  how much to take how to take this when to take this additional instructions       No Known Allergies    The results of significant diagnostics from this hospitalization (including imaging, microbiology, ancillary and laboratory) are listed below for reference.    Significant Diagnostic Studies: CT Angio Chest PE W/Cm &/Or Wo Cm  Result Date: 01/20/2021 CLINICAL DATA:  PE suspected, low/intermediate prob, positive D-dimer Vomiting since last night. EXAM: CT ANGIOGRAPHY CHEST WITH CONTRAST TECHNIQUE: Multidetector CT imaging of the chest was performed using the standard protocol  during bolus administration of intravenous contrast. Multiplanar CT image reconstructions and MIPs were obtained to evaluate the vascular anatomy. CONTRAST:  100mL OMNIPAQUE IOHEXOL 350 MG/ML SOLN COMPARISON:  Radiograph earlier today.  Chest CTA 11/10/2019 FINDINGS: Cardiovascular: There are no filling defects within the pulmonary arteries to suggest pulmonary embolus. Diffuse aortic atherosclerosis and tortuosity. No aortic aneurysm. Cannot assess for dissection given phase of contrast tailored to pulmonary artery evaluation. There are coronary artery calcifications. Mild cardiomegaly. No pericardial effusion. Mediastinum/Nodes: No enlarged mediastinal or hilar lymph nodes. Decompressed esophagus. Small hiatal hernia. No thyroid nodule. Lungs/Pleura: No acute airspace disease. No pleural effusion. No pulmonary edema. No pulmonary nodule or mass. The trachea and central bronchi are patent. Upper Abdomen: Cyst on concurrent abdominopelvic CT, reported separately. Musculoskeletal: Moderate thoracic scoliosis. 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 There are no acute or suspicious osseous abnormalities. Review of the MIP images confirms the above findings. IMPRESSION: 1. No pulmonary embolus or acute intrathoracic abnormality. 2. Diffuse aortic atherosclerosis and tortuosity. Coronary artery calcifications. Mild cardiomegaly. Aortic Atherosclerosis (ICD10-I70.0). Electronically Signed   By: Narda RutherfordMelanie  Sanford M.D.   On: 01/20/2021 16:51   CT Abdomen Pelvis W Contrast  Result Date: 01/20/2021 CLINICAL DATA:  Nausea and vomiting. EXAM: CT ABDOMEN AND PELVIS WITH CONTRAST TECHNIQUE: Multidetector CT imaging of the abdomen and pelvis was performed using the standard protocol following bolus administration of intravenous contrast. CONTRAST:  100mL OMNIPAQUE IOHEXOL 350 MG/ML SOLN COMPARISON:  Abdominopelvic CT 11/09/2020 FINDINGS: Lower chest: Assessed on concurrent chest CT, reported separately.  Hepatobiliary: Previous subcentimeter low-density in the lateral right liver is not well seen on the current exam. Multiple stones in the gallbladder with gallbladder distension and mild wall thickening/pericholecystic edema, for example series 7, image 62. There is intra and extrahepatic biliary ductal dilatation with noncalcified filling defects in the common bile duct, for example series 4, image 30 and 33. Common bile duct measures 10 mm. Pancreas: Parenchymal atrophy. No ductal dilatation or inflammation. Spleen: Normal in size without focal abnormality. Adrenals/Urinary Tract: Normal adrenal glands. 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 There is no hydronephrosis or perinephric edema. Symmetric renal excretion on delayed phase imaging. Stable cyst in the lower left kidney. Unremarkable urinary bladder. Stomach/Bowel: Tiny hiatal hernia. Decompressed stomach. No small bowel obstruction or inflammation normal appendix. Multifocal colonic diverticulosis, most prominently affecting the left colon, without diverticulitis no colonic inflammation. Vascular/Lymphatic: Advanced aortic atherosclerosis. Advanced aortic branch atherosclerosis.  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 Portal vein is patent. No portal venous or mesenteric gas. Infrarenal IVC filter in place. No enlarged lymph nodes in the abdomen or pelvis. Reproductive: Atrophic uterus, normal for age. Quiescent ovaries. No adnexal mass. Other: No ascites, free air or focal fluid collection. Musculoskeletal: No acute osseous abnormalities are seen. 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 Prominent lumbar facet hypertrophy. IMPRESSION: 1. Cholelithiasis with gallbladder distension and mild wall thickening/pericholecystic edema, suspicious for acute cholecystitis. 2. Intra and extrahepatic biliary ductal dilatation with noncalcified filling defects in the common bile duct, suspicious for  choledocholithiasis. 3. Colonic diverticulosis without diverticulitis. Aortic Atherosclerosis (ICD10-I70.0). Electronically Signed   By: Narda Rutherford M.D.   On: 01/20/2021 16:57   US Venous Img Lower  Left (DVT Study)  Result Date: 12/30/2020 CLINICAL DATA:  Left foot pain EXAM: Left LOWER EXTREMITY VENOUS DOPPLER ULTRASOUND TECHNIQUE: Gray-scale sonography with compression, as well as color and duplex ultrasound, were performed to evaluate the deep venous system(s) from the level of the common femoral vein through the popliteal and proximal calf veins. COMPARISON:  Radiograph 12/30/2020, ultrasound 11/10/2019, CT 11/10/2019 FINDINGS: VENOUS Normal compressibility of the common femoral, superficial femoral, and popliteal veins, as well as the visualized calf veins. Peroneal vein not visualized. 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 Visualized portions of profunda femoral vein and great saphenous vein unremarkable. No filling defects to suggest DVT on grayscale or color Doppler imaging. Doppler waveforms show normal direction of venous flow, normal respiratory plasticity and response to augmentation. Limited views of the contralateral common femoral vein are unremarkable. OTHER Sonographer acquired additional images of arterial system of the left lower extremity which demonstrate probable peripheral vascular disease. There appears to be occlusion of the proximal SFA, this is likely present on CT from 2021. Limitations: none IMPRESSION: 1. Negative for acute left lower extremity DVT. Venous varicosities. 2. 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 Electronically Signed   By: Jasmine Pang M.D.   On: 12/30/2020 18:38   DG Chest Port 1 View  Result Date: 01/27/2021 CLINICAL DATA:  GI bleed EXAM: PORTABLE CHEST 1 VIEW COMPARISON:  01/20/2021 FINDINGS: Mild cardiomegaly. No confluent opacities or effusions. No acute bony  abnormality. IMPRESSION: Cardiomegaly.  No active disease. Electronically Signed   By: Charlett Nose M.D.   On: 01/27/2021 10:44   DG Chest Portable 1 View  Result Date: 01/20/2021 CLINICAL DATA:  Cough shortness of breath, LEFT-sided pain since last night. EXAM: PORTABLE CHEST 1 VIEW COMPARISON:  August 15, 2020. FINDINGS: EKG leads project over the chest. Cardiomediastinal contours show persistent cardiac enlargement and fullness of central pulmonary vasculature. No frank edema. No consolidation. No pleural effusion on frontal radiograph. On limited assessment no acute skeletal process. IMPRESSION: Cardiomegaly and central vascular congestion without frank edema. Similar to prior chest x-ray. Electronically Signed   By: Donzetta Kohut M.D.   On: 01/20/2021 15:16   DG ERCP  Result Date: 01/22/2021 CLINICAL DATA:  Biliary stones EXAM: ERCP TECHNIQUE: Multiple spot images obtained with the fluoroscopic device and submitted for interpretation post-procedure. FLUOROSCOPY TIME:  Fluoroscopy Time:  3 minutes 7 seconds Radiation Exposure Index (if provided by the fluoroscopic device): 53 mGy Number of Acquired Spot Images: 8 COMPARISON:  None. FINDINGS: Submitted intraoperative fluoroscopic images demonstrate multiple filling defects in the bile ducts the which may be due to stones/debris or air. Balloon noted within the mid common bile duct on  1 of the images. IMPRESSION: Intraoperative fluoroscopic images of ERCP as above. These images were submitted for radiologic interpretation only. Please see the procedural report for the amount of contrast and the fluoroscopy time utilized. Electronically Signed   By: Acquanetta Belling M.D.   On: 01/22/2021 14:57   DG Foot Complete Left  Result Date: 12/30/2020 CLINICAL DATA:  Left heel pain EXAM: LEFT FOOT - COMPLETE 3+ VIEW COMPARISON:  None. FINDINGS: No fracture or malalignment. Moderate plantar calcaneal spur and mild plantar soft tissue calcification. Posterior calcaneal  enthesophyte. IMPRESSION: 1. No acute osseous abnormality 2. Moderate plantar calcaneal spurring Electronically Signed   By: Jasmine Pang M.D.   On: 12/30/2020 17:22   US Abdomen Limited RUQ (LIVER/GB)  Result Date: 01/27/2021 CLINICAL DATA:  Right upper quadrant pain for 2 days. EXAM: ULTRASOUND ABDOMEN LIMITED RIGHT UPPER QUADRANT COMPARISON:  None. FINDINGS: Gallbladder: Not visualized.  Patient 8 in the hours prior to the study. Common bile duct: Diameter: 3 mm. Liver: No focal lesion identified. Within normal limits in parenchymal echogenicity. Pneumobilia evident. Patient is status post sphincterotomy 01/22/2021. Portal vein is patent on color Doppler imaging with normal direction of blood flow towards the liver. Other: None. IMPRESSION: 1. Nonvisualization of the gallbladder. Patient did eat in the hours prior to the study. Exam could be repeated after in appropriate period of fasting as clinically warranted. 2. No intra or extrahepatic biliary duct dilatation. Pneumobilia in the liver is compatible the history of recent sphincterotomy. Electronically Signed   By: Kennith Center M.D.   On: 01/27/2021 14:40   US Abdomen Limited RUQ (LIVER/GB)  Result Date: 01/20/2021 CLINICAL DATA:  Right upper quadrant pain with vomiting EXAM: ULTRASOUND ABDOMEN LIMITED RIGHT UPPER QUADRANT COMPARISON:  CT 01/20/2021 FINDINGS: Gallbladder: Small stones in the gallbladder. Gallbladder is distended. Slight increased wall thickness up to 6 mm but no sonographic Murphy. Common bile duct: Diameter: 5.6 mm Liver: No focal lesion identified. Within normal limits in parenchymal echogenicity. Portal vein is patent on color Doppler imaging with normal direction of blood flow towards the liver. Other: None. IMPRESSION: 1. Distended gallbladder with stones and slight wall thickening but negative sonographic Murphy. Correlation with nuclear medicine hepatobiliary imaging could be obtained if continued concern for acute  cholecystitis Electronically Signed   By: Jasmine Pang M.D.   On: 01/20/2021 18:42    Microbiology: Recent Results (from the past 240 hour(s))  Resp Panel by RT-PCR (Flu A&B, Covid) Nasopharyngeal Swab     Status: None   Collection Time: 01/20/21  3:00 PM   Specimen: Nasopharyngeal Swab; Nasopharyngeal(NP) swabs in vial transport medium  Result Value Ref Range Status   SARS Coronavirus 2 by RT PCR NEGATIVE NEGATIVE Final    Comment: (NOTE) SARS-CoV-2 target nucleic acids are NOT DETECTED.  The SARS-CoV-2 RNA is generally detectable in upper respiratory specimens during the acute phase of infection. The lowest concentration of SARS-CoV-2 viral copies this assay can detect is 138 copies/mL. A negative result does not preclude SARS-Cov-2 infection and should not be used as the sole basis for treatment or other patient management decisions. A negative result may occur with  improper specimen collection/handling, submission of specimen other than nasopharyngeal swab, presence of viral mutation(s) within the areas targeted by this assay, and inadequate number of viral copies(<138 copies/mL). A negative result must be combined with clinical observations, patient history, and epidemiological information. The expected result is Negative.  Fact Sheet for Patients:  BloggerCourse.com  Fact Sheet for Healthcare Providers:  SeriousBroker.it  This test is no t yet approved or cleared by the [place] and  has been authorized for detection and/or diagnosis of SARS-CoV-2 by FDA under an Emergency Use Authorization (EUA). This EUA will remain  in effect (meaning this test can be used) for the duration of the COVID-19 declaration under Section 564(b)(1) of the Act, 21 U.S.C.section 360bbb-3(b)(1), unless the authorization is terminated  or revoked sooner.       Influenza A by PCR NEGATIVE NEGATIVE Final   Influenza B by PCR NEGATIVE  NEGATIVE Final    Comment: (NOTE) The Xpert Xpress SARS-CoV-2/FLU/RSV plus assay is intended as an aid in the diagnosis of influenza from Nasopharyngeal swab specimens and should not be used as a sole basis for treatment. Nasal washings and aspirates are unacceptable for Xpert Xpress SARS-CoV-2/FLU/RSV testing.  Fact Sheet for Patients: BloggerCourse.com  Fact Sheet for Healthcare Providers: SeriousBroker.it  This test is not yet approved or cleared by the [place] FDA and has been authorized for detection and/or diagnosis of SARS-CoV-2 by FDA under an Emergency Use Authorization (EUA). This EUA will remain in effect (meaning this test can be used) for the duration of the COVID-19 declaration under Section 564(b)(1) of the Act, 21 U.S.C. section 360bbb-3(b)(1), unless the authorization is terminated or revoked.  Performed at [facility], [address], [place], [place] [address]   Urine culture     Status: Abnormal   Collection Time: 01/20/21  5:00 PM   Specimen: Urine, Random  Result Value Ref Range Status   Specimen Description   Final    URINE, RANDOM Performed at [facility], [address] [place], [place] [address]    Special Requests   Final    NONE Performed at [facility], [address] [place], [place] [address]    Culture MULTIPLE SPECIES PRESENT, SUGGEST RECOLLECTION (A)  Final   Report Status 01/22/2021 FINAL  Final  MRSA Next Gen by PCR, Nasal     Status: Abnormal   Collection Time: 01/23/21 11:15 PM   Specimen: Nasal Mucosa; Nasal Swab  Result Value Ref Range Status   MRSA by PCR Next Gen DETECTED (A) NOT DETECTED Final    Comment: RESULT CALLED TO, READ BACK BY AND VERIFIED WITH: CHANDLER,T RN AT 0143 01/24/2021 MITCHELL,L (NOTE) The GeneXpert MRSA Assay (FDA approved for NASAL specimens only), is one component of a comprehensive MRSA colonization  surveillance program. It is not intended to diagnose MRSA infection nor to guide or monitor treatment for MRSA infections. Test performance is not FDA approved in patients less than 17 years old. Performed at [facility], [address], [place], [place] [address]   Resp Panel by RT-PCR (Flu A&B, Covid) Nasopharyngeal Swab     Status: None   Collection Time: 01/27/21  1:03 PM   Specimen: Nasopharyngeal Swab; Nasopharyngeal(NP) swabs in vial transport medium  Result Value Ref Range Status   SARS Coronavirus 2 by RT PCR NEGATIVE NEGATIVE Final    Comment: (NOTE) SARS-CoV-2 target nucleic acids are NOT DETECTED.  The SARS-CoV-2 RNA is generally detectable in upper respiratory specimens during the acute phase of infection. The lowest concentration of SARS-CoV-2 viral copies this assay can detect is 138 copies/mL. A negative result does not preclude SARS-Cov-2 infection and should not be used as the sole basis for treatment or other patient management decisions. A negative result may occur with  improper specimen collection/handling, submission  of specimen other than nasopharyngeal swab, presence of viral mutation(s) within the areas targeted by this assay, and inadequate number of viral copies(<138 copies/mL). A negative result must be combined with clinical observations, patient history, and epidemiological information. The expected result is Negative.  Fact Sheet for Patients:  BloggerCourse.com  Fact Sheet for Healthcare Providers:  SeriousBroker.it  This test is no t yet approved or cleared by the [place] FDA and  has been authorized for detection and/or diagnosis of SARS-CoV-2 by FDA under an Emergency Use Authorization (EUA). This EUA will remain  in effect (meaning this test can be used) for the duration of the COVID-19 declaration under Section 564(b)(1) of the Act, 21 U.S.C.section 360bbb-3(b)(1), unless the  authorization is terminated  or revoked sooner.       Influenza A by PCR NEGATIVE NEGATIVE Final   Influenza B by PCR NEGATIVE NEGATIVE Final    Comment: (NOTE) The Xpert Xpress SARS-CoV-2/FLU/RSV plus assay is intended as an aid in the diagnosis of influenza from Nasopharyngeal swab specimens and should not be used as a sole basis for treatment. Nasal washings and aspirates are unacceptable for Xpert Xpress SARS-CoV-2/FLU/RSV testing.  Fact Sheet for Patients: BloggerCourse.com  Fact Sheet for Healthcare Providers: SeriousBroker.it  This test is not yet approved or cleared by the [place] FDA and has been authorized for detection and/or diagnosis of SARS-CoV-2 by FDA under an Emergency Use Authorization (EUA). This EUA will remain in effect (meaning this test can be used) for the duration of the COVID-19 declaration under Section 564(b)(1) of the Act, 21 U.S.C. section 360bbb-3(b)(1), unless the authorization is terminated or revoked.  Performed at [facility], [address] [place], [place] [address]      Labs: Basic Metabolic Panel: Recent Labs  Lab 01/22/21 0518 01/23/21 0922 01/24/21 0502 01/27/21 1050 01/28/21 1117  NA 137 134* 138 137 139  K 4.1 4.5 4.2 3.9 4.2  CL 109 107 110 105 111  CO2 20* 19* 19* 22 19*  GLUCOSE 107* 218* 82 125* 122*  BUN 24* 12  CREATININE 1.03* 1.00 0.97 1.17* 0.93  CALCIUM 8.7* 8.5* 8.9 9.1 8.3*   Liver Function Tests: Recent Labs  Lab 01/22/21 0518 01/23/21 0922 01/24/21 0502 01/27/21 1050 01/28/21 0028  AST 76* 40 34 92* 45*  ALT 183* 122* 99* 153* 93*  ALKPHOS 140* 123 110 253* 166*  BILITOT 0.9 0.7 0.8 1.4* 1.1  PROT 6.5 6.1* 6.0* 6.9 5.5*  ALBUMIN 2.7* 2.6* 2.6* 2.9* 2.4*   No results for input(s): LIPASE, AMYLASE in the last 168 hours. No results for input(s): AMMONIA in the last 168 hours. CBC: Recent Labs  Lab 01/23/21 0922  01/24/21 0502 01/27/21 1050 01/27/21 2000 01/28/21 0028 01/28/21 1117  WBC 13.0* 11.4* 14.3*  --  10.3 8.8  NEUTROABS  --   --  10.2*  --   --   --   HGB 13.0 13.7 14.9 12.1 11.9* 11.8*  HCT 40.8 42.1 45.9 37.8 36.7 37.8  MCV 88.5 87.9 88.8  --  88.0 90.6  PLT 177 182 228  --  204 187   Cardiac Enzymes: No results for input(s): CKTOTAL, CKMB, CKMBINDEX, TROPONINI in the last 168 hours. BNP: BNP (last 3 results) Recent Labs    08/15/20 0422 01/20/21 1500  BNP 504.1* 1,059.5*    ProBNP (last 3 results) No results for input(s): PROBNP in the last 8760 hours.  CBG: Recent Labs  Lab 01/24/21 0422  01/24/21 0751 01/24/21 1115 01/28/21 0744 01/28/21 1154  GLUCAP 76 75 77 86 124*       Signed:  Hollice Espy, MD [facility] 01/28/2021, 2:30 PM

## 2021-01-28 NOTE — Transfer of Care (Signed)
Immediate Anesthesia Transfer of Care Note  Patient: Jill Crane  Procedure(s) Performed: ESOPHAGOGASTRODUODENOSCOPY (EGD) WITH PROPOFOL  Patient Location: Endoscopy Unit  Anesthesia Type:MAC  Level of Consciousness: responds to stimulation  Airway & Oxygen Therapy: Patient Spontanous Breathing and Patient connected to nasal cannula oxygen  Post-op Assessment: Report given to RN and Post -op Vital signs reviewed and stable  Post vital signs: Reviewed and stable  Last Vitals:  Vitals Value Taken Time  BP    Temp    Pulse 94 01/28/21 0930  Resp 30 01/28/21 0930  SpO2 92 % 01/28/21 0930  Vitals shown include unvalidated device data.  Last Pain:  Vitals:   01/28/21 0835  TempSrc: Oral  PainSc: 0-No pain         Complications: No notable events documented.

## 2021-01-28 NOTE — Care Management CC44 (Signed)
Condition Code 44 Documentation Completed  Patient Details  Name: Jill Crane MRN: [id number] Date of Birth: 07/06/1941   Condition Code 44 given:  Yes Patient signature on Condition Code 44 notice:  Yes Documentation of 2 MD's agreement:  Yes Code 44 added to claim:  Yes    Mearl Latin, LCSW 01/28/2021, 3:41 PM

## 2021-01-28 NOTE — Anesthesia Procedure Notes (Signed)
Procedure Name: MAC Date/Time: 01/28/2021 9:11 AM Performed by: Babs Bertin, CRNA Pre-anesthesia Checklist: Patient identified, Emergency Drugs available, Suction available, Patient being monitored and Timeout performed Patient Re-evaluated:Patient Re-evaluated prior to induction Oxygen Delivery Method: Nasal cannula

## 2021-01-28 NOTE — Anesthesia Preprocedure Evaluation (Addendum)
Anesthesia Evaluation  Patient identified by MRN, date of birth, ID band Patient awake    Reviewed: Allergy & Precautions, NPO status , Patient's Chart, lab work & pertinent test results, reviewed documented beta blocker date and time   Airway Mallampati: III  TM Distance: >3 FB Neck ROM: Full    Dental  (+) Edentulous Upper, Edentulous Lower, Dental Advisory Given   Pulmonary COPD, former smoker,    Pulmonary exam normal breath sounds clear to auscultation       Cardiovascular hypertension, Pt. on home beta blockers and Pt. on medications +CHF  Normal cardiovascular exam+ dysrhythmias Atrial Fibrillation  Rhythm:Regular Rate:Normal     Neuro/Psych PSYCHIATRIC DISORDERS Anxiety negative neurological ROS     GI/Hepatic negative GI ROS, Neg liver ROS,   Endo/Other  diabetes, Type 2, Insulin Dependent, Oral Hypoglycemic Agents  Renal/GU Renal disease (Cr 1.17, K 3.9)  negative genitourinary   Musculoskeletal negative musculoskeletal ROS (+)   Abdominal   Peds  Hematology  (+) Blood dyscrasia (on coumadin, INR 1.4 from 01/24/21, Hgb 11.9), ,   Anesthesia Other Findings medical history significant of recent cholecystitis and choledocholithiasis status post ERCP CBD stone extraction, chronic A. 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, HTN, IIDM, chronic diastolic CHF, COPD, GERD presented with black tarry stool  Reproductive/Obstetrics                            Anesthesia Physical Anesthesia Plan  ASA: 3  Anesthesia Plan: MAC   Post-op Pain Management:    Induction: Intravenous  PONV Risk Score and Plan: Propofol infusion and Treatment may vary due to age or medical condition  Airway Management Planned: Natural Airway  Additional Equipment:   Intra-op Plan:   Post-operative Plan:   Informed Consent: I have reviewed the patients History and Physical, chart, labs  and discussed the procedure including the risks, benefits and alternatives for the proposed anesthesia with the patient or authorized representative who has indicated his/her understanding and acceptance.     Dental advisory given  Plan Discussed with: CRNA  Anesthesia Plan Comments:         Anesthesia Quick Evaluation

## 2021-01-28 NOTE — Op Note (Signed)
[facility] Patient Name: Jill Crane Procedure Date : 01/28/2021 MRN: [id number] Attending MD: Iva Boop , MD Date of Birth: [birth year] CSN: [id number] Age: 80 Admit Type: Inpatient Procedure:                Upper GI endoscopy Indications:              Melena Providers:                Iva Boop, MD, Adolph Pollack, RN, Michele Mcalpine Technician Referring MD:              Medicines:                Propofol per Anesthesia, Monitored Anesthesia Care Complications:            No immediate complications. Estimated Blood Loss:     Estimated blood loss: none. Procedure:                Pre-Anesthesia Assessment:                           - Prior to the procedure, a History and Physical                            was performed, and patient medications and                            allergies were reviewed. The patient's tolerance of                            previous anesthesia was also reviewed. The risks                            and benefits of the procedure and the sedation                            options and risks were discussed with the patient.                            All questions were answered, and informed consent                            was obtained. Prior Anticoagulants: The patient                            last took Coumadin (warfarin) 1 day and Eliquis                            (apixaban) 1 day prior to the procedure. ASA Grade                            Assessment: III - A patient with severe systemic  disease. After reviewing the risks and benefits,                            the patient was deemed in satisfactory condition to                            undergo the procedure.                           After obtaining informed consent, the endoscope was                            passed under direct vision. Throughout the                            procedure, the patient's blood  pressure, pulse, and                            oxygen saturations were monitored continuously. The                            GIF-H190 ([id number] Olympus gastroscope was                            introduced through the mouth, and advanced to the                            second part of duodenum. The upper GI endoscopy was                            accomplished without difficulty. The patient                            tolerated the procedure well. Scope In: Scope Out: Findings:      Localized mucosal changes characterized by erosion and ulceration were       found at the major papilla.      Mucosal changes characterized by acute ecchymosis were found in the       upper third of the esophagus and in the middle third of the esophagus.      The exam was otherwise without abnormality.      The cardia and gastric fundus were normal on retroflexion. Impression:               - Mucosal changes in the duodenum.                           - Acute ecchymosis mucosa in the esophagus. I think                            scope trauma in setting of pemphigus? - do not                            think source of bleeding                           -  The examination was otherwise normal.                           - No specimens collected. Recommendation:           - Return patient to hospital ward for possible                            discharge same day.                           - Resume regular diet.                           - Restart warfarin today do not use Lovenox                           aggressively load warfarin is my recommendation -                            consulted pharmacy                           reviewed with daughter by phone Procedure Code(s):        --- Professional ---                           [phone], Esophagogastroduodenoscopy, flexible,                            transoral; diagnostic, including collection of                            specimen(s) by brushing or washing,  when performed                            (separate procedure) Diagnosis Code(s):        --- Professional ---                           K31.89, Other diseases of stomach and duodenum                           K92.1, Melena (includes Hematochezia) CPT copyright 2019 [employer] Association. All rights reserved. The codes documented in this report are preliminary and upon coder review may  be revised to meet current compliance requirements. Iva Boop, MD 01/28/2021 10:40:23 AM This report has been signed electronically. Number of Addenda: 0

## 2021-01-28 NOTE — Anesthesia Postprocedure Evaluation (Signed)
Anesthesia Post Note  Patient: Jill Crane  Procedure(s) Performed: ESOPHAGOGASTRODUODENOSCOPY (EGD) WITH PROPOFOL     Patient location during evaluation: Endoscopy Anesthesia Type: MAC Level of consciousness: awake and alert Pain management: pain level controlled Vital Signs Assessment: post-procedure vital signs reviewed and stable Respiratory status: spontaneous breathing, nonlabored ventilation, respiratory function stable and patient connected to nasal cannula oxygen Cardiovascular status: blood pressure returned to baseline and stable Postop Assessment: no apparent nausea or vomiting Anesthetic complications: no   No notable events documented.  Last Vitals:  Vitals:   01/28/21 1241 01/28/21 1547  BP: 121/77 126/80  Pulse: 100 74  Resp: 18 16  Temp: 36.8 C 36.6 C  SpO2: 99% 100%    Last Pain:  Vitals:   01/28/21 1547  TempSrc: Oral  PainSc:                  Chika Cichowski L Delight Bickle

## 2021-01-28 NOTE — Progress Notes (Signed)
ANTICOAGULATION CONSULT NOTE - Initial Consult  Pharmacy Consult for Coumadin Indication: atrial fibrillation  No Known Allergies  Patient Measurements: Height: 5\' 5"  (165.1 cm) Weight: 72.2 kg (159 lb 2.8 oz) IBW/kg (Calculated) : 57 Heparin Dosing Weight:   Vital Signs: Temp: 98.5 F (36.9 C) (07/15 0930) Temp Source: Oral (07/15 0930) BP: 124/67 (07/15 1000) Pulse Rate: 90 (07/15 1000)  Labs: Recent Labs    01/27/21 1050 01/27/21 2000 01/28/21 0028  HGB 14.9 12.1 11.9*  HCT 45.9 37.8 36.7  PLT 228  --  204  CREATININE 1.17*  --   --     Estimated Creatinine Clearance: 38.8 mL/min (A) (by C-G formula based on SCr of 1.17 mg/dL (H)).   Medical History: Past Medical History:  Diagnosis Date   Atrial fibrillation with normal ventricular rate (HCC)    CHF (congestive heart failure) (HCC)    COPD (chronic obstructive pulmonary disease) (HCC)    Diabetes mellitus without complication (HCC)    Hx of blood clots    Hypertension     Medications:  Medications Prior to Admission  Medication Sig Dispense Refill Last Dose   albuterol (PROVENTIL HFA;VENTOLIN HFA) 108 (90 BASE) MCG/ACT inhaler Inhale 2 puffs into the lungs every 6 (six) hours as needed for wheezing or shortness of breath.   unk   alendronate (FOSAMAX) 35 MG tablet Take 35 mg by mouth every Monday. Take with a full glass of water on an empty stomach.   Past Week   amoxicillin-clavulanate (AUGMENTIN) 875-125 MG tablet Take 1 tablet by mouth 2 (two) times daily for 5 days. 10 tablet 0 01/27/2021   aspirin 81 MG chewable tablet Chew 81 mg by mouth daily.   01/27/2021   brimonidine (ALPHAGAN) 0.2 % ophthalmic solution Place 1 drop into the left eye in the morning and at bedtime.   01/26/2021   busPIRone (BUSPAR) 10 MG tablet Take 10 mg by mouth 2 (two) times daily.   01/27/2021   calcium-vitamin D (OSCAL WITH D) 500-200 MG-UNIT per tablet Take 1 tablet by mouth.   01/27/2021   dorzolamide (TRUSOPT) 2 % ophthalmic  solution Place 1 drop into both eyes at bedtime.   01/26/2021   enoxaparin (LOVENOX) 80 MG/0.8ML injection Inject 0.7 mLs (70 mg total) into the skin 2 (two) times daily for 8 days. 11.2 mL 0 01/27/2021   furosemide (LASIX) 20 MG tablet Take 40 mg (2 tablets) tomorrow and the day after, then continue with 20 mg daily (Patient taking differently: Take 20 mg by mouth daily.) 30 tablet 0 01/27/2021   gabapentin (NEURONTIN) 300 MG capsule Take 300 mg by mouth at bedtime.   01/26/2021   magnesium oxide (MAG-OX) 400 (241.3 Mg) MG tablet Take 400 mg by mouth daily.   01/27/2021   MATZIM LA 240 MG 24 hr tablet Take 240 mg by mouth daily.   01/27/2021   metFORMIN (GLUCOPHAGE) 500 MG tablet Take 500-1,000 mg by mouth See admin instructions. Take 2 tablets with morning meal, and 1 tablet with evening meals   01/27/2021   metoprolol tartrate (LOPRESSOR) 25 MG tablet Take 1 tablet (25 mg total) by mouth 2 (two) times daily.   01/27/2021 at 8am   omeprazole (PRILOSEC) 20 MG capsule Take 20 mg by mouth daily.   01/27/2021   predniSONE (DELTASONE) 2.5 MG tablet Take 10 mg by mouth daily with breakfast.   01/27/2021   silver sulfADIAZINE (SILVADENE) 1 % cream Apply 1 application topically daily as needed for irritation.  to scalp   unk   warfarin (COUMADIN) 2 MG tablet Take 2-4 mg by mouth See admin instructions. Take 1 tablet (2 mg) daily except Take 2 tablets (4 mg) on (Tues & Fri)   01/26/2021   polyethylene glycol powder (GLYCOLAX/MIRALAX) 17 GM/SCOOP powder Take 17 g by mouth daily as needed for constipation.   unk   Scheduled:   aspirin  81 mg Oral Daily   brimonidine  1 drop Left Eye TID   busPIRone  10 mg Oral BID   calcium-vitamin D  1 tablet Oral Q breakfast   dorzolamide  1 drop Both Eyes QHS   gabapentin  300 mg Oral QHS   insulin aspart  0-9 Units Subcutaneous TID WC   magnesium oxide  400 mg Oral Daily   metFORMIN  500 mg Oral BID WC   nystatin   Topical BID   predniSONE  20 mg Oral Q breakfast     Assessment: Pt was admitted for possible GIB. She is s/p EGD and was relatively normal. Coumadin was ordered to be restarted without Lovenox bridge. We will give a higher dose today to load.   PTA coumadin: 2mg  qday except 4mg  Tue/Fri Goal of Therapy:  INR 2-3 Monitor platelets by anticoagulation protocol: Yes   Plan:  Coumadin 6mg  PO x1 Daily INR If dc home today, give coumadin 4mg  tomorrow then resume home dose. Check INR Monday  , PharmD, [place], AAHIVP, CPP Infectious Disease Pharmacist 01/28/2021 10:57 AM

## 2021-01-28 NOTE — Interval H&P Note (Signed)
History and Physical Interval Note:  01/28/2021 9:00 AM  Jill Crane  has presented today for surgery, with the diagnosis of melena, pinkish emesis, ercp sphincterotomy last week.  on coumadin and lovenox PTA..  The various methods of treatment have been discussed with the patient and family. After consideration of risks, benefits and other options for treatment, the patient has consented to  Procedure(s): ESOPHAGOGASTRODUODENOSCOPY (EGD) WITH PROPOFOL (N/A) as a surgical intervention.  The patient's history has been reviewed, patient examined, no change in status, stable for surgery.  I have reviewed the patient's chart and labs.  Questions were answered to the patient's satisfaction.     Stan Head

## 2021-01-28 NOTE — Progress Notes (Signed)
Discharge paperwork reviewed with pt. Pt verbalized understanding. Pt alert and oriented x4 in no acute distress upon discharge. Pt's daughter downstairs awaiting to transport pt home. Pt taken to front lobby by this nurse. Pt's belongings taken with her. Pt reminded to down off her warfarin prescription.

## 2021-01-28 NOTE — Care Management Obs Status (Signed)
MEDICARE OBSERVATION STATUS NOTIFICATION   Patient Details  Name: Jill Crane MRN: [id number] Date of Birth: [birth year]   Medicare Observation Status Notification Given:  Yes    Mearl Latin, LCSW 01/28/2021, 3:41 PM

## 2021-07-14 ENCOUNTER — Emergency Department (HOSPITAL_BASED_OUTPATIENT_CLINIC_OR_DEPARTMENT_OTHER)
Admission: EM | Admit: 2021-07-14 | Discharge: 2021-07-14 | Disposition: A | Discharge status: Home / Self Care | Payer: Medicare Other | Attending: Emergency Medicine | Admitting: Emergency Medicine

## 2021-07-14 ENCOUNTER — Emergency Department (HOSPITAL_BASED_OUTPATIENT_CLINIC_OR_DEPARTMENT_OTHER): Payer: Medicare Other

## 2021-07-14 ENCOUNTER — Other Ambulatory Visit: Payer: Self-pay

## 2021-07-14 ENCOUNTER — Encounter (HOSPITAL_BASED_OUTPATIENT_CLINIC_OR_DEPARTMENT_OTHER): Payer: Self-pay

## 2021-07-14 DIAGNOSIS — Z20822 Contact with and (suspected) exposure to covid-19: Secondary | ICD-10-CM | POA: Diagnosis not present

## 2021-07-14 DIAGNOSIS — Z7982 Long term (current) use of aspirin: Secondary | ICD-10-CM | POA: Diagnosis not present

## 2021-07-14 DIAGNOSIS — Z79899 Other long term (current) drug therapy: Secondary | ICD-10-CM | POA: Insufficient documentation

## 2021-07-14 DIAGNOSIS — Z7951 Long term (current) use of inhaled steroids: Secondary | ICD-10-CM | POA: Insufficient documentation

## 2021-07-14 DIAGNOSIS — R5383 Other fatigue: Secondary | ICD-10-CM | POA: Insufficient documentation

## 2021-07-14 DIAGNOSIS — N3000 Acute cystitis without hematuria: Secondary | ICD-10-CM | POA: Insufficient documentation

## 2021-07-14 DIAGNOSIS — Z7984 Long term (current) use of oral hypoglycemic drugs: Secondary | ICD-10-CM | POA: Insufficient documentation

## 2021-07-14 DIAGNOSIS — R791 Abnormal coagulation profile: Secondary | ICD-10-CM | POA: Diagnosis not present

## 2021-07-14 DIAGNOSIS — I5032 Chronic diastolic (congestive) heart failure: Secondary | ICD-10-CM | POA: Insufficient documentation

## 2021-07-14 DIAGNOSIS — I110 Hypertensive heart disease with heart failure: Secondary | ICD-10-CM | POA: Insufficient documentation

## 2021-07-14 DIAGNOSIS — J449 Chronic obstructive pulmonary disease, unspecified: Secondary | ICD-10-CM | POA: Insufficient documentation

## 2021-07-14 DIAGNOSIS — Z7901 Long term (current) use of anticoagulants: Secondary | ICD-10-CM | POA: Diagnosis not present

## 2021-07-14 DIAGNOSIS — E119 Type 2 diabetes mellitus without complications: Secondary | ICD-10-CM | POA: Insufficient documentation

## 2021-07-14 DIAGNOSIS — R531 Weakness: Secondary | ICD-10-CM | POA: Diagnosis not present

## 2021-07-14 DIAGNOSIS — I4891 Unspecified atrial fibrillation: Secondary | ICD-10-CM | POA: Diagnosis not present

## 2021-07-14 LAB — CBC WITH DIFFERENTIAL/PLATELET
Abs Immature Granulocytes: 0.02 10*3/uL (ref 0.00–0.07)
Basophils Absolute: 0.1 10*3/uL (ref 0.0–0.1)
Basophils Relative: 1 %
Eosinophils Absolute: 0.1 10*3/uL (ref 0.0–0.5)
Eosinophils Relative: 1 %
HCT: 43.3 % (ref 36.0–46.0)
Hemoglobin: 14.2 g/dL (ref 12.0–15.0)
Immature Granulocytes: 0 %
Lymphocytes Relative: 37 %
Lymphs Abs: 2.5 10*3/uL (ref 0.7–4.0)
MCH: 27.7 pg (ref 26.0–34.0)
MCHC: 32.8 g/dL (ref 30.0–36.0)
MCV: 84.6 fL (ref 80.0–100.0)
Monocytes Absolute: 0.6 10*3/uL (ref 0.1–1.0)
Monocytes Relative: 9 %
Neutro Abs: 3.5 10*3/uL (ref 1.7–7.7)
Neutrophils Relative %: 52 %
Platelets: 191 10*3/uL (ref 150–400)
RBC: 5.12 MIL/uL — ABNORMAL HIGH (ref 3.87–5.11)
RDW: 17.6 % — ABNORMAL HIGH (ref 11.5–15.5)
WBC: 6.8 10*3/uL (ref 4.0–10.5)
nRBC: 0 % (ref 0.0–0.2)

## 2021-07-14 LAB — COMPREHENSIVE METABOLIC PANEL
ALT: 20 U/L (ref 0–44)
AST: 41 U/L (ref 15–41)
Albumin: 3.3 g/dL — ABNORMAL LOW (ref 3.5–5.0)
Alkaline Phosphatase: 93 U/L (ref 38–126)
Anion gap: 9 (ref 5–15)
BUN: 13 mg/dL (ref 8–23)
CO2: 19 mmol/L — ABNORMAL LOW (ref 22–32)
Calcium: 8.8 mg/dL — ABNORMAL LOW (ref 8.9–10.3)
Chloride: 105 mmol/L (ref 98–111)
Creatinine, Ser: 1.1 mg/dL — ABNORMAL HIGH (ref 0.44–1.00)
GFR, Estimated: 51 mL/min — ABNORMAL LOW (ref >60)
Glucose, Bld: 94 mg/dL (ref 70–99)
Potassium: 5.4 mmol/L — ABNORMAL HIGH (ref 3.5–5.1)
Sodium: 133 mmol/L — ABNORMAL LOW (ref 135–145)
Total Bilirubin: 1.3 mg/dL — ABNORMAL HIGH (ref 0.3–1.2)
Total Protein: 6.9 g/dL (ref 6.5–8.1)

## 2021-07-14 LAB — URINALYSIS, ROUTINE W REFLEX MICROSCOPIC
Bilirubin Urine: NEGATIVE
Glucose, UA: NEGATIVE mg/dL
Ketones, ur: NEGATIVE mg/dL
Nitrite: NEGATIVE
Protein, ur: NEGATIVE mg/dL
Specific Gravity, Urine: 1.025 (ref 1.005–1.030)
pH: 5.5 (ref 5.0–8.0)

## 2021-07-14 LAB — URINALYSIS, MICROSCOPIC (REFLEX)

## 2021-07-14 LAB — PROTIME-INR
INR: 1.9 — ABNORMAL HIGH (ref 0.8–1.2)
Prothrombin Time: 22 seconds — ABNORMAL HIGH (ref 11.4–15.2)

## 2021-07-14 LAB — SEDIMENTATION RATE: Sed Rate: 27 mm/hr — ABNORMAL HIGH (ref 0–22)

## 2021-07-14 LAB — RESP PANEL BY RT-PCR (FLU A&B, COVID) ARPGX2
Influenza A by PCR: NEGATIVE
Influenza B by PCR: NEGATIVE
SARS Coronavirus 2 by RT PCR: NEGATIVE

## 2021-07-14 LAB — LIPASE, BLOOD: Lipase: 32 U/L (ref 11–51)

## 2021-07-14 LAB — MAGNESIUM: Magnesium: 1.8 mg/dL (ref 1.7–2.4)

## 2021-07-14 LAB — TROPONIN I (HIGH SENSITIVITY): Troponin I (High Sensitivity): 6 ng/L (ref <18)

## 2021-07-14 MED ORDER — SODIUM CHLORIDE 0.9 % IV BOLUS
500.0000 mL | Freq: Once | INTRAVENOUS | Status: AC
  Administered 2021-07-14: 14:00:00 500 mL via INTRAVENOUS

## 2021-07-14 MED ORDER — CEPHALEXIN 500 MG PO CAPS: 500.0000 mg | ORAL_CAPSULE | Freq: Three times a day (TID) | ORAL | 0 refills | Status: AC

## 2021-07-14 MED ORDER — SODIUM CHLORIDE 0.9 % IV BOLUS
500.0000 mL | Freq: Once | INTRAVENOUS | Status: AC
  Administered 2021-07-14: 16:00:00 500 mL via INTRAVENOUS

## 2021-07-14 NOTE — ED Provider Notes (Signed)
[place] EMERGENCY DEPARTMENT Provider Note   CSN: [id number] Arrival date & time: 07/14/21  1221     History Chief Complaint  Patient presents with   Fatigue    Jill Crane is a 80 y.o. female.  She has a history of A. fib on warfarin, CHF COPD diabetes.  She is brought in by family member for complaining of feeling fatigued for 2 weeks.  She sometimes feels like she is in a pass out.  She denies any headache chest pain cough abdominal pain vomiting diarrhea or urinary symptoms.  She has some chronic pain in her left lower leg that her daughter states is due to blood flow problems.  She has not seen her primary care doctor for her fatigue due to it being cold outside.  The history is provided by the patient and a relative.  Weakness Severity:  Moderate Onset quality:  Gradual Duration:  2 weeks Timing:  Constant Progression:  Unchanged Chronicity:  New Relieved by:  Nothing Worsened by:  Activity Ineffective treatments:  None tried Associated symptoms: difficulty walking   Associated symptoms: no abdominal pain, no chest pain, no cough, no diarrhea, no falls, no fever, no foul-smelling urine, no frequency, no headaches, no loss of consciousness, no nausea, no shortness of breath and no vomiting   Risk factors: diabetes       Past Medical History:  Diagnosis Date   Atrial fibrillation with normal ventricular rate (HCC)    CHF (congestive heart failure) (HCC)    COPD (chronic obstructive pulmonary disease) ([place])    Diabetes mellitus without complication (HCC)    Hx of blood clots    Hypertension     Patient Active Problem List   Diagnosis Date Noted   AKI (acute kidney injury) ([place]) 01/28/2021   GI bleed 01/28/2021   Chronic diastolic CHF (congestive heart failure) (HCC)    GERD (gastroesophageal reflux disease)    Acute upper GI bleed 01/27/2021   Choledocholithiasis    Current chronic use of systemic steroids 01/21/2021   Cholecystitis 01/20/2021    SIRS (systemic inflammatory response syndrome) ([place]) 08/15/2020   Acute lower UTI 08/15/2020   A-fib ([place]) 08/15/2020   Diabetes ([place]) 08/15/2020   Essential hypertension 08/15/2020   Sepsis ([place]) 11/10/2019    Past Surgical History:  Procedure Laterality Date   ENDOSCOPIC RETROGRADE CHOLANGIOPANCREATOGRAPHY (ERCP) WITH PROPOFOL N/A 01/22/2021   Procedure: ENDOSCOPIC RETROGRADE CHOLANGIOPANCREATOGRAPHY (ERCP) WITH PROPOFOL;  Surgeon: Irene Shipper, MD;  Location: [facility] ENDOSCOPY;  Service: Endoscopy;  Laterality: N/A;   ESOPHAGOGASTRODUODENOSCOPY (EGD) WITH PROPOFOL N/A 01/28/2021   Procedure: ESOPHAGOGASTRODUODENOSCOPY (EGD) WITH PROPOFOL;  Surgeon: Gatha Mayer, MD;  Location: [facility];  Service: Endoscopy;  Laterality: N/A;   REMOVAL OF STONES  01/22/2021   Procedure: REMOVAL OF STONES;  Surgeon: Irene Shipper, MD;  Location: [facility] ENDOSCOPY;  Service: Endoscopy;;   SPHINCTEROTOMY  01/22/2021   Procedure: Joan Mayans;  Surgeon: Irene Shipper, MD;  Location: [facility] ENDOSCOPY;  Service: Endoscopy;;     OB History   No obstetric history on file.     Family History  Problem Relation Age of Onset   Stroke Mother    Diabetes Mother    Stroke Father     Social History   Tobacco Use   Smoking status: Former    Types: Cigarettes   Smokeless tobacco: Never  Vaping Use   Vaping Use: Never used  Substance Use Topics   Alcohol use: No   Drug use:  Never    Home Medications Prior to Admission medications   Medication Sig Start Date End Date Taking? Authorizing Provider  albuterol (PROVENTIL HFA;VENTOLIN HFA) 108 (90 BASE) MCG/ACT inhaler Inhale 2 puffs into the lungs every 6 (six) hours as needed for wheezing or shortness of breath.    [provider]  alendronate (FOSAMAX) 35 MG tablet Take 35 mg by mouth every Monday. Take with a full glass of water on an empty stomach.    [provider]  aspirin 81 MG chewable tablet Chew 81 mg by mouth daily.    [provider]  brimonidine (ALPHAGAN) 0.2 % ophthalmic solution Place 1 drop into the left eye in the morning and at bedtime. 10/21/19   [provider]  busPIRone (BUSPAR) 10 MG tablet Take 10 mg by mouth 2 (two) times daily. 08/04/20   [provider]  calcium-vitamin D (OSCAL WITH D) 500-200 MG-UNIT per tablet Take 1 tablet by mouth.    [provider]  dorzolamide (TRUSOPT) 2 % ophthalmic solution Place 1 drop into both eyes at bedtime. 08/03/20   [provider]  furosemide (LASIX) 20 MG tablet Take 1 tablet (20 mg total) by mouth daily. 01/28/21   Annita Brod, MD  gabapentin (NEURONTIN) 300 MG capsule Take 300 mg by mouth at bedtime. 08/10/20   [provider]  magnesium oxide (MAG-OX) 400 (241.3 Mg) MG tablet Take 400 mg by mouth daily. 07/08/20   [provider]  MATZIM LA 240 MG 24 hr tablet Take 240 mg by mouth daily. 08/03/20   [provider]  metFORMIN (GLUCOPHAGE) 500 MG tablet Take 500-1,000 mg by mouth See admin instructions. Take 2 tablets with morning meal, and 1 tablet with evening meals 08/24/20   [provider]  metoprolol tartrate (LOPRESSOR) 25 MG tablet Take 1 tablet (25 mg total) by mouth 2 (two) times daily. 08/17/20   Nita Sells, MD  omeprazole (PRILOSEC) 20 MG capsule Take 20 mg by mouth daily.    [provider]  polyethylene glycol powder (GLYCOLAX/MIRALAX) 17 GM/SCOOP powder Take 17 g by mouth daily as needed for constipation.    [provider]  predniSONE (DELTASONE) 2.5 MG tablet Take 10 mg by mouth daily with breakfast.    [provider]  silver sulfADIAZINE (SILVADENE) 1 % cream Apply 1 application topically daily as needed for irritation. to scalp 06/16/20   [provider]  warfarin (COUMADIN) 2 MG tablet On Saturday and Sunday, take 2 tabs (4mg ), then resume normal regimen Take 1 tablet (2 mg) daily except Take 2 tablets (4 mg) on (Tues & Fri)  01/28/21   Annita Brod, MD    Allergies    Patient has no known allergies.  Review of Systems   Review of Systems  Constitutional:  Positive for fatigue. Negative for fever.  HENT:  Negative for sore throat.   Eyes:  Negative for visual disturbance.  Respiratory:  Negative for cough and shortness of breath.   Cardiovascular:  Negative for chest pain.  Gastrointestinal:  Negative for abdominal pain, diarrhea, nausea and vomiting.  Genitourinary:  Negative for frequency.  Musculoskeletal:  Negative for falls and neck pain.  Skin:  Negative for rash.  Neurological:  Positive for weakness. Negative for loss of consciousness and headaches.   Physical Exam Updated Vital Signs BP (!) 106/57 (BP Location: Left Arm)    Pulse 78    Temp 98.2 F (36.8 C) (Oral)    Resp  20    Ht 5\' 3"  (1.6 m)    Wt 72.1 kg    SpO2 100%    BMI 28.17 kg/m   Physical Exam Vitals and nursing note reviewed.  Constitutional:      General: She is not in acute distress.    Appearance: Normal appearance. She is well-developed.  HENT:     Head: Normocephalic and atraumatic.  Eyes:     Conjunctiva/sclera: Conjunctivae normal.  Cardiovascular:     Rate and Rhythm: Normal rate and regular rhythm.     Heart sounds: No murmur heard. Pulmonary:     Effort: Pulmonary effort is normal. No respiratory distress.     Breath sounds: Normal breath sounds. No stridor. No wheezing.  Abdominal:     Palpations: Abdomen is soft.     Tenderness: There is no abdominal tenderness. There is no guarding or rebound.  Musculoskeletal:        General: No tenderness or deformity. Normal range of motion.     Cervical back: Neck supple.  Skin:    General: Skin is warm and dry.  Neurological:     General: No focal deficit present.     Mental Status: She is alert.     GCS: GCS eye subscore is 4. GCS verbal subscore is 5. GCS motor subscore is 6.     Sensory: No sensory deficit.     Motor: No weakness.    ED Results /  Procedures / Treatments   Labs (all labs ordered are listed, but only abnormal results are displayed) Labs Reviewed  CBC WITH DIFFERENTIAL/PLATELET - Abnormal; Notable for the following components:      Result Value   RBC 5.12 (*)    RDW 17.6 (*)    All other components within normal limits  URINALYSIS, ROUTINE W REFLEX MICROSCOPIC - Abnormal; Notable for the following components:   APPearance HAZY (*)    Hgb urine dipstick SMALL (*)    Leukocytes,Ua TRACE (*)    All other components within normal limits  SEDIMENTATION RATE - Abnormal; Notable for the following components:   Sed Rate 27 (*)    All other components within normal limits  COMPREHENSIVE METABOLIC PANEL - Abnormal; Notable for the following components:   Sodium 133 (*)    Potassium 5.4 (*)    CO2 19 (*)    Creatinine, Ser 1.10 (*)    Calcium 8.8 (*)    Albumin 3.3 (*)    Total Bilirubin 1.3 (*)    GFR, Estimated 51 (*)    All other components within normal limits  PROTIME-INR - Abnormal; Notable for the following components:   Prothrombin Time 22.0 (*)    INR 1.9 (*)    All other components within normal limits  URINALYSIS, MICROSCOPIC (REFLEX) - Abnormal; Notable for the following components:   Bacteria, UA FEW (*)    All other components within normal limits  RESP PANEL BY RT-PCR (FLU A&B, COVID) ARPGX2  LIPASE, BLOOD  MAGNESIUM  TROPONIN I (HIGH SENSITIVITY)    EKG EKG Interpretation  Date/Time:  Thursday July 14 2021 12:45:39 EST Ventricular Rate:  78 PR Interval:    QRS Duration: 74 QT Interval:  364 QTC Calculation: 414 R Axis:   30 Text Interpretation: Atrial fibrillation Nonspecific ST and T wave abnormality Abnormal ECG No significant change since prior 7/22 Confirmed by 8/22 [phone]) on 07/14/2021 12:49:10 PM  Radiology DG Chest Port 1 View  Result Date: 07/14/2021 CLINICAL DATA:  Fatigue  for 2 weeks. EXAM: PORTABLE CHEST 1 VIEW COMPARISON:  Chest x-ray dated January 27, 2021.  FINDINGS: Stable cardiomegaly. Normal pulmonary vascularity. Atherosclerotic calcification of the aortic arch. No focal consolidation, pleural effusion, or pneumothorax. No acute osseous abnormality. IMPRESSION: 1. No active disease. Electronically Signed   By: Titus Dubin M.D.   On: 07/14/2021 13:51    Procedures Procedures   Medications Ordered in ED Medications  sodium chloride 0.9 % bolus 500 mL (0 mLs Intravenous Stopped 07/14/21 1517)  sodium chloride 0.9 % bolus 500 mL (0 mLs Intravenous Stopped 07/14/21 1706)    ED Course  I have reviewed the triage vital signs and the nursing notes.  Pertinent labs & imaging results that were available during my care of the patient were reviewed by me and considered in my medical decision making (see chart for details).  Clinical Course as of 07/14/21 1735  Thu Jul 14, 2021  1354 Chest x-ray interpreted by me as cardiomegaly no acute infiltrates.  Awaiting radiology reading. [MB]    Clinical Course User Index [MB] Hayden Rasmussen, MD   MDM Rules/Calculators/A&P                         LUVINA MAGALLON was evaluated in Emergency Department on 07/14/2021 for the symptoms described in the history of present illness. She was evaluated in the context of the global COVID-19 pandemic, which necessitated consideration that the patient might be at risk for infection with the SARS-CoV-2 virus that causes COVID-19. Institutional protocols and algorithms that pertain to the evaluation of patients at risk for COVID-19 are in a state of rapid change based on information released by regulatory bodies including the CDC and federal and state organizations. These policies and algorithms were followed during the patient's care in the ED.  This patient complains of generalized weakness fatigue lightheadedness may be near syncope; this involves an extensive number of treatment Options and is a complaint that carries with it a high risk of complications  and Morbidity. The differential includes dehydration, arrhythmia, ACS metabolic derangement, infection  I ordered, reviewed and interpreted labs, which included CBC with normal white count normal hemoglobin, chemistries with mildly low sodium, mild elevation of potassium creatinine INR slightly subtherapeutic at 1.9, sed rate mildly elevated no priors to compare with, COVID and flu negative, troponin unremarkable I ordered medication IV fluids I ordered imaging studies which included chest x-ray and I independently    visualized and interpreted imaging which showed no clear infiltrate Additional history obtained from patient's daughter Previous records obtained and reviewed in epic no recent admissions  After the interventions stated above, I reevaluated the patient and found patient to be symptomatically improved.  Her care was signed out to Dr. Almyra Free to follow-up on results of urinalysis and response to IV fluids.  Likely can be discharged to follow-up outpatient with her primary care doctor.     Final Clinical Impression(s) / ED Diagnoses Final diagnoses:  Generalized weakness  Other fatigue  Acute cystitis without hematuria    Rx / DC Orders ED Discharge Orders          Ordered    cephALEXin (KEFLEX) 500 MG capsule  3 times daily        07/14/21 1701             Hayden Rasmussen, MD 07/14/21 [phone]

## 2021-07-14 NOTE — ED Triage Notes (Signed)
Pt c/o fatigue x 2 week-denies fever/flu sx-only pain site c/o is left LE that is not new-NAD-steady gait

## 2021-07-14 NOTE — Discharge Instructions (Signed)
Call your primary care doctor or specialist as discussed in the next 2-3 days.   Return immediately back to the ER if:  Your symptoms worsen within the next 12-24 hours. You develop new symptoms such as new fevers, persistent vomiting, new pain, shortness of breath, or new weakness or numbness, or if you have any other concerns.  

## 2021-07-14 NOTE — ED Provider Notes (Signed)
Patient monitored in the ER for several hours with no additional adverse events.  She states she feels much better after some IV fluid resuscitation.  Urinalysis positive for leukocytes concerning for urinary tract infection.  Will treat with Keflex.  Advised outpatient follow-up with her doctor in 2 to 3 days.  Vies me to return for worsening symptoms pain fevers or any additional concerns.    Cheryll Cockayne, MD 07/14/21 [phone]

## 2022-01-11 ENCOUNTER — Emergency Department (HOSPITAL_BASED_OUTPATIENT_CLINIC_OR_DEPARTMENT_OTHER)
Admission: EM | Admit: 2022-01-11 | Discharge: 2022-01-12 | Disposition: A | Discharge status: Home / Self Care | Payer: Medicare Other | Attending: Emergency Medicine | Admitting: Emergency Medicine

## 2022-01-11 ENCOUNTER — Other Ambulatory Visit: Payer: Self-pay

## 2022-01-11 ENCOUNTER — Emergency Department (HOSPITAL_BASED_OUTPATIENT_CLINIC_OR_DEPARTMENT_OTHER): Payer: Medicare Other

## 2022-01-11 ENCOUNTER — Encounter (HOSPITAL_BASED_OUTPATIENT_CLINIC_OR_DEPARTMENT_OTHER): Payer: Self-pay | Admitting: Emergency Medicine

## 2022-01-11 DIAGNOSIS — Z20822 Contact with and (suspected) exposure to covid-19: Secondary | ICD-10-CM | POA: Insufficient documentation

## 2022-01-11 DIAGNOSIS — R42 Dizziness and giddiness: Secondary | ICD-10-CM | POA: Diagnosis present

## 2022-01-11 DIAGNOSIS — Z7951 Long term (current) use of inhaled steroids: Secondary | ICD-10-CM | POA: Diagnosis not present

## 2022-01-11 DIAGNOSIS — J449 Chronic obstructive pulmonary disease, unspecified: Secondary | ICD-10-CM | POA: Diagnosis not present

## 2022-01-11 DIAGNOSIS — E119 Type 2 diabetes mellitus without complications: Secondary | ICD-10-CM | POA: Diagnosis not present

## 2022-01-11 DIAGNOSIS — R531 Weakness: Secondary | ICD-10-CM | POA: Diagnosis not present

## 2022-01-11 DIAGNOSIS — Z7901 Long term (current) use of anticoagulants: Secondary | ICD-10-CM | POA: Insufficient documentation

## 2022-01-11 DIAGNOSIS — Z7984 Long term (current) use of oral hypoglycemic drugs: Secondary | ICD-10-CM | POA: Insufficient documentation

## 2022-01-11 DIAGNOSIS — I4891 Unspecified atrial fibrillation: Secondary | ICD-10-CM | POA: Diagnosis not present

## 2022-01-11 DIAGNOSIS — Z7982 Long term (current) use of aspirin: Secondary | ICD-10-CM | POA: Diagnosis not present

## 2022-01-11 DIAGNOSIS — R791 Abnormal coagulation profile: Secondary | ICD-10-CM

## 2022-01-11 LAB — URINALYSIS, ROUTINE W REFLEX MICROSCOPIC
Bilirubin Urine: NEGATIVE
Glucose, UA: NEGATIVE mg/dL
Ketones, ur: NEGATIVE mg/dL
Nitrite: NEGATIVE
Protein, ur: NEGATIVE mg/dL
Specific Gravity, Urine: 1.01 (ref 1.005–1.030)
pH: 5.5 (ref 5.0–8.0)

## 2022-01-11 LAB — BASIC METABOLIC PANEL
Anion gap: 8 (ref 5–15)
BUN: 21 mg/dL (ref 8–23)
CO2: 20 mmol/L — ABNORMAL LOW (ref 22–32)
Calcium: 8.8 mg/dL — ABNORMAL LOW (ref 8.9–10.3)
Chloride: 108 mmol/L (ref 98–111)
Creatinine, Ser: 1.04 mg/dL — ABNORMAL HIGH (ref 0.44–1.00)
GFR, Estimated: 54 mL/min — ABNORMAL LOW (ref >60)
Glucose, Bld: 91 mg/dL (ref 70–99)
Potassium: 4.9 mmol/L (ref 3.5–5.1)
Sodium: 136 mmol/L (ref 135–145)

## 2022-01-11 LAB — PROTIME-INR
INR: 1.7 — ABNORMAL HIGH (ref 0.8–1.2)
Prothrombin Time: 20.1 seconds — ABNORMAL HIGH (ref 11.4–15.2)

## 2022-01-11 LAB — URINALYSIS, MICROSCOPIC (REFLEX)

## 2022-01-11 LAB — CBC
HCT: 42.1 % (ref 36.0–46.0)
Hemoglobin: 13.4 g/dL (ref 12.0–15.0)
MCH: 27.5 pg (ref 26.0–34.0)
MCHC: 31.8 g/dL (ref 30.0–36.0)
MCV: 86.4 fL (ref 80.0–100.0)
Platelets: 201 10*3/uL (ref 150–400)
RBC: 4.87 MIL/uL (ref 3.87–5.11)
RDW: 16.3 % — ABNORMAL HIGH (ref 11.5–15.5)
WBC: 8.5 10*3/uL (ref 4.0–10.5)
nRBC: 0 % (ref 0.0–0.2)

## 2022-01-11 LAB — SARS CORONAVIRUS 2 BY RT PCR: SARS Coronavirus 2 by RT PCR: NEGATIVE

## 2022-01-11 MED ORDER — SODIUM CHLORIDE 0.9 % IV BOLUS
1000.0000 mL | Freq: Once | INTRAVENOUS | Status: AC
  Administered 2022-01-11: 1000 mL via INTRAVENOUS

## 2022-01-11 NOTE — ED Notes (Signed)
ED Provider at bedside. 

## 2022-01-11 NOTE — ED Notes (Signed)
Per EDP Yao, pt to go POV to [facility] ED for MRI .  Pt agreeable to this plan.  PIV to remain intact, wrapped with ACE bandage, per EDP request.

## 2022-01-11 NOTE — ED Provider Notes (Signed)
[facility] EMERGENCY DEPARTMENT Provider Note   CSN: [id number] Arrival date & time: 01/11/22  1749     History  Chief Complaint  Patient presents with   Weakness    Jill Crane is a 81 y.o. female history of COPD, diabetes, A-fib on Coumadin, here presenting with weakness and dizziness.  Patient states that for the last 3 days she has been feeling weak.  She also states that she feels unsteady when she walks and has some dizziness as well.  Denies any fevers or chills or vomiting.  They were trying to go to the store today and she has hard time getting up.  She walks with a cane at baseline  The history is provided by the patient.       Home Medications Prior to Admission medications   Medication Sig Start Date End Date Taking? Authorizing Provider  albuterol (PROVENTIL HFA;VENTOLIN HFA) 108 (90 BASE) MCG/ACT inhaler Inhale 2 puffs into the lungs every 6 (six) hours as needed for wheezing or shortness of breath.    [provider]  alendronate (FOSAMAX) 35 MG tablet Take 35 mg by mouth every Monday. Take with a full glass of water on an empty stomach.    [provider]  aspirin 81 MG chewable tablet Chew 81 mg by mouth daily.    [provider]  brimonidine (ALPHAGAN) 0.2 % ophthalmic solution Place 1 drop into the left eye in the morning and at bedtime. 10/21/19   [provider]  busPIRone (BUSPAR) 10 MG tablet Take 10 mg by mouth 2 (two) times daily. 08/04/20   [provider]  calcium-vitamin D (OSCAL WITH D) 500-200 MG-UNIT per tablet Take 1 tablet by mouth.    [provider]  dorzolamide (TRUSOPT) 2 % ophthalmic solution Place 1 drop into both eyes at bedtime. 08/03/20   [provider]  furosemide (LASIX) 20 MG tablet Take 1 tablet (20 mg total) by mouth daily. 01/28/21   Hollice Espy, MD  gabapentin (NEURONTIN) 300 MG capsule Take 300 mg by mouth at bedtime. 08/10/20   [provider]   magnesium oxide (MAG-OX) 400 (241.3 Mg) MG tablet Take 400 mg by mouth daily. 07/08/20   [provider]  MATZIM LA 240 MG 24 hr tablet Take 240 mg by mouth daily. 08/03/20   [provider]  metFORMIN (GLUCOPHAGE) 500 MG tablet Take 500-1,000 mg by mouth See admin instructions. Take 2 tablets with morning meal, and 1 tablet with evening meals 08/24/20   [provider]  metoprolol tartrate (LOPRESSOR) 25 MG tablet Take 1 tablet (25 mg total) by mouth 2 (two) times daily. 08/17/20   Rhetta Mura, MD  omeprazole (PRILOSEC) 20 MG capsule Take 20 mg by mouth daily.    [provider]  polyethylene glycol powder (GLYCOLAX/MIRALAX) 17 GM/SCOOP powder Take 17 g by mouth daily as needed for constipation.    [provider]  predniSONE (DELTASONE) 2.5 MG tablet Take 10 mg by mouth daily with breakfast.    [provider]  silver sulfADIAZINE (SILVADENE) 1 % cream Apply 1 application topically daily as needed for irritation. to scalp 06/16/20   [provider]  warfarin (COUMADIN) 2 MG tablet On Saturday and Sunday, take 2 tabs (4mg ), then resume normal regimen Take 1 tablet (2 mg) daily except Take 2 tablets (4 mg) on (Tues & Fri) 01/28/21   01/30/21, MD      Allergies    Patient  has no known allergies.    Review of Systems   Review of Systems  Neurological:  Positive for weakness.  All other systems reviewed and are negative.   Physical Exam Updated Vital Signs BP (!) 171/73   Pulse 87   Temp 97.7 F (36.5 C) (Oral)   Resp 19   SpO2 98%  Physical Exam Vitals and nursing note reviewed.  Constitutional:      Appearance: Normal appearance.     Comments: Chronically ill  HENT:     Head: Normocephalic.     Nose: Nose normal.     Mouth/Throat:     Mouth: Mucous membranes are dry.  Eyes:     Extraocular Movements: Extraocular movements intact.     Pupils: Pupils are equal, round, and reactive to light.   Cardiovascular:     Rate and Rhythm: Normal rate and regular rhythm.     Pulses: Normal pulses.     Heart sounds: Normal heart sounds.  Pulmonary:     Effort: Pulmonary effort is normal.     Breath sounds: Normal breath sounds.  Abdominal:     General: Abdomen is flat.     Palpations: Abdomen is soft.  Musculoskeletal:        General: Normal range of motion.     Cervical back: Normal range of motion and neck supple.  Skin:    General: Skin is warm.     Capillary Refill: Capillary refill takes less than 2 seconds.  Neurological:     Mental Status: She is alert.     Comments: Cranial nerves II to XII intact.  Patient has normal strength and sensation bilaterally.  Patient is unsteady when she walks even with a cane.  She has normal finger-to-nose bilaterally  Psychiatric:        Mood and Affect: Mood normal.        Behavior: Behavior normal.     ED Results / Procedures / Treatments   Labs (all labs ordered are listed, but only abnormal results are displayed) Labs Reviewed  BASIC METABOLIC PANEL - Abnormal; Notable for the following components:      Result Value   CO2 20 (*)    Creatinine, Ser 1.04 (*)    Calcium 8.8 (*)    GFR, Estimated 54 (*)    All other components within normal limits  CBC - Abnormal; Notable for the following components:   RDW 16.3 (*)    All other components within normal limits  URINALYSIS, ROUTINE W REFLEX MICROSCOPIC - Abnormal; Notable for the following components:   Hgb urine dipstick TRACE (*)    Leukocytes,Ua TRACE (*)    All other components within normal limits  PROTIME-INR - Abnormal; Notable for the following components:   Prothrombin Time 20.1 (*)    INR 1.7 (*)    All other components within normal limits  URINALYSIS, MICROSCOPIC (REFLEX) - Abnormal; Notable for the following components:   Bacteria, UA RARE (*)    All other components within normal limits  SARS CORONAVIRUS 2 BY RT PCR  CBG MONITORING, ED    EKG EKG  Interpretation  Date/Time:  Wednesday January 11 2022 18:04:57 EDT Ventricular Rate:  99 PR Interval:    QRS Duration: 70 QT Interval:  342 QTC Calculation: 438 R Axis:   42 Text Interpretation: Atrial fibrillation Septal infarct , age undetermined Abnormal ECG When compared with ECG of 14-Jul-2021 12:45, PREVIOUS ECG IS PRESENT Confirmed by Wandra Arthurs [phone]) on 01/11/2022 8:35:36  PM  Radiology CT Head Wo Contrast  Result Date: 01/11/2022 CLINICAL DATA:  Dizziness EXAM: CT HEAD WITHOUT CONTRAST TECHNIQUE: Contiguous axial images were obtained from the base of the skull through the vertex without intravenous contrast. RADIATION DOSE REDUCTION: This exam was performed according to the departmental dose-optimization program which includes automated exposure control, adjustment of the mA and/or kV according to patient size and/or use of iterative reconstruction technique. COMPARISON:  None Available. FINDINGS: Brain: No acute territorial infarction, hemorrhage or intracranial mass. Mild atrophy. Patchy bilateral white matter hypodensity, consistent with chronic small vessel ischemic change. 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 Nonenlarged ventricles. Age indeterminate hypodensity in the right thalamus. Vascular: No hyperdense vessels.  Carotid vascular calcification Skull: Normal. Negative for fracture or focal lesion. Sinuses/Orbits: No acute finding. Other: None IMPRESSION: 1. Atrophy and chronic small vessel ischemic changes of the white matter. Age indeterminate hypodensity/lacunar infarct in the right thalamus. Small chronic right cerebellar infarct. Electronically Signed   By: Jasmine Pang M.D.   On: 01/11/2022 21:18   DG Chest 2 View  Result Date: 01/11/2022 CLINICAL DATA:  Weakness EXAM: CHEST - 2 VIEW COMPARISON:  12/07/2021 FINDINGS: Heart and mediastinal contours are within normal limits. No focal opacities or effusions. No acute bony abnormality. Tortuous aorta with  calcifications. IMPRESSION: No active cardiopulmonary disease. Electronically Signed   By: Charlett Nose M.D.   On: 01/11/2022 21:10    Procedures Procedures    Medications Ordered in ED Medications  sodium chloride 0.9 % bolus 1,000 mL (1,000 mLs Intravenous New Bag/Given 01/11/22 2050)    ED Course/ Medical Decision Making/ A&P                           Medical Decision Making ELISHEVA FALLAS is a 81 y.o. female here with weakness and dizziness and trouble walking.  Differential is large.  Consider infection such as UTI or pneumonia versus electrolyte abnormality versus stroke.  Plan to get CBC and CMP and CT head and chest x-ray and urinalysis.  Will hydrate patient and reassess  9:46 PM I reviewed patient's labs and independently interpreted imaging studies.  Patient CBC and BMP unremarkable.  INR is 1.7 (patient is subtherapeutic on Coumadin).  CT showed possible age indeterminate right thalamic infarct.  Given that patient has subtherapeutic INR and is at risk for stroke and has CT findings that showed possible subacute infarct, will transfer to [facility] ED for MRI brain.  Dr. Posey Rea is accepting doctor     Amount and/or Complexity of Data Reviewed Labs: ordered. Decision-making details documented in ED Course. Radiology: ordered and independent interpretation performed. Decision-making details documented in ED Course.    Final Clinical Impression(s) / ED Diagnoses Final diagnoses:  None    Rx / DC Orders ED Discharge Orders     None         Charlynne Pander, MD 01/11/22 2148

## 2022-01-11 NOTE — ED Notes (Signed)
Pt transported to CT ?

## 2022-01-11 NOTE — ED Triage Notes (Signed)
Generalized weakness x 3 days. Denies urinary sx. No neuro deficits.

## 2022-01-12 ENCOUNTER — Emergency Department (HOSPITAL_COMMUNITY): Payer: Medicare Other

## 2022-01-12 NOTE — ED Provider Notes (Signed)
Patient transferred to undergo MRI.  Patient with history of atrial fibrillation, slightly subtherapeutic INR with a CT scan with age-indeterminate infarct.  Patient was initially seen because she was weak and unsteady when she walks, has some elements of dizziness.  Patient has undergone MRI.  There are no acute strokes noted on the MRI.  I did review the rest of her work-up.  I do not see any obvious abnormalities that would explain her dizziness and weakness but she has been thoroughly worked up and does not require hospitalization at this time.  Can follow-up with primary care for recheck.   Gilda Crease, MD 01/12/22 [phone]

## 2022-01-28 ENCOUNTER — Encounter (HOSPITAL_BASED_OUTPATIENT_CLINIC_OR_DEPARTMENT_OTHER): Payer: Self-pay | Admitting: Emergency Medicine

## 2022-01-28 ENCOUNTER — Inpatient Hospital Stay (HOSPITAL_BASED_OUTPATIENT_CLINIC_OR_DEPARTMENT_OTHER)
Admission: EM | Admit: 2022-01-28 | Discharge: 2022-02-02 | DRG: 872 | Disposition: A | Discharge status: Home Health | Payer: Medicare Other | Attending: Student | Admitting: Student

## 2022-01-28 ENCOUNTER — Other Ambulatory Visit: Payer: Self-pay

## 2022-01-28 ENCOUNTER — Emergency Department (HOSPITAL_BASED_OUTPATIENT_CLINIC_OR_DEPARTMENT_OTHER): Payer: Medicare Other

## 2022-01-28 DIAGNOSIS — I110 Hypertensive heart disease with heart failure: Secondary | ICD-10-CM | POA: Diagnosis present

## 2022-01-28 DIAGNOSIS — F419 Anxiety disorder, unspecified: Secondary | ICD-10-CM | POA: Diagnosis present

## 2022-01-28 DIAGNOSIS — I4891 Unspecified atrial fibrillation: Secondary | ICD-10-CM | POA: Diagnosis present

## 2022-01-28 DIAGNOSIS — Z79899 Other long term (current) drug therapy: Secondary | ICD-10-CM

## 2022-01-28 DIAGNOSIS — L109 Pemphigus, unspecified: Secondary | ICD-10-CM | POA: Diagnosis present

## 2022-01-28 DIAGNOSIS — E1165 Type 2 diabetes mellitus with hyperglycemia: Secondary | ICD-10-CM | POA: Diagnosis present

## 2022-01-28 DIAGNOSIS — E1151 Type 2 diabetes mellitus with diabetic peripheral angiopathy without gangrene: Secondary | ICD-10-CM | POA: Diagnosis present

## 2022-01-28 DIAGNOSIS — Z7901 Long term (current) use of anticoagulants: Secondary | ICD-10-CM

## 2022-01-28 DIAGNOSIS — K819 Cholecystitis, unspecified: Principal | ICD-10-CM

## 2022-01-28 DIAGNOSIS — A419 Sepsis, unspecified organism: Secondary | ICD-10-CM | POA: Diagnosis present

## 2022-01-28 DIAGNOSIS — K219 Gastro-esophageal reflux disease without esophagitis: Secondary | ICD-10-CM | POA: Diagnosis present

## 2022-01-28 DIAGNOSIS — I5032 Chronic diastolic (congestive) heart failure: Secondary | ICD-10-CM | POA: Diagnosis present

## 2022-01-28 DIAGNOSIS — I10 Essential (primary) hypertension: Secondary | ICD-10-CM | POA: Diagnosis present

## 2022-01-28 DIAGNOSIS — I4892 Unspecified atrial flutter: Secondary | ICD-10-CM | POA: Diagnosis present

## 2022-01-28 DIAGNOSIS — K810 Acute cholecystitis: Secondary | ICD-10-CM | POA: Diagnosis not present

## 2022-01-28 DIAGNOSIS — Z7952 Long term (current) use of systemic steroids: Secondary | ICD-10-CM

## 2022-01-28 DIAGNOSIS — I959 Hypotension, unspecified: Secondary | ICD-10-CM

## 2022-01-28 DIAGNOSIS — Z7982 Long term (current) use of aspirin: Secondary | ICD-10-CM

## 2022-01-28 DIAGNOSIS — E1140 Type 2 diabetes mellitus with diabetic neuropathy, unspecified: Secondary | ICD-10-CM | POA: Diagnosis present

## 2022-01-28 DIAGNOSIS — E119 Type 2 diabetes mellitus without complications: Secondary | ICD-10-CM

## 2022-01-28 DIAGNOSIS — Z87891 Personal history of nicotine dependence: Secondary | ICD-10-CM

## 2022-01-28 DIAGNOSIS — I482 Chronic atrial fibrillation, unspecified: Secondary | ICD-10-CM | POA: Diagnosis present

## 2022-01-28 DIAGNOSIS — J449 Chronic obstructive pulmonary disease, unspecified: Secondary | ICD-10-CM | POA: Diagnosis present

## 2022-01-28 DIAGNOSIS — A4159 Other Gram-negative sepsis: Secondary | ICD-10-CM | POA: Diagnosis not present

## 2022-01-28 DIAGNOSIS — K8000 Calculus of gallbladder with acute cholecystitis without obstruction: Secondary | ICD-10-CM | POA: Diagnosis present

## 2022-01-28 DIAGNOSIS — D72825 Bandemia: Secondary | ICD-10-CM

## 2022-01-28 DIAGNOSIS — Z833 Family history of diabetes mellitus: Secondary | ICD-10-CM

## 2022-01-28 DIAGNOSIS — Z86718 Personal history of other venous thrombosis and embolism: Secondary | ICD-10-CM

## 2022-01-28 DIAGNOSIS — Z7984 Long term (current) use of oral hypoglycemic drugs: Secondary | ICD-10-CM

## 2022-01-28 DIAGNOSIS — F32A Depression, unspecified: Secondary | ICD-10-CM | POA: Diagnosis present

## 2022-01-28 DIAGNOSIS — Z7983 Long term (current) use of bisphosphonates: Secondary | ICD-10-CM

## 2022-01-28 DIAGNOSIS — Z872 Personal history of diseases of the skin and subcutaneous tissue: Secondary | ICD-10-CM

## 2022-01-28 LAB — CBC
HCT: 39.2 % (ref 36.0–46.0)
Hemoglobin: 12.9 g/dL (ref 12.0–15.0)
MCH: 27.3 pg (ref 26.0–34.0)
MCHC: 32.9 g/dL (ref 30.0–36.0)
MCV: 83.1 fL (ref 80.0–100.0)
Platelets: 208 10*3/uL (ref 150–400)
RBC: 4.72 MIL/uL (ref 3.87–5.11)
RDW: 15.8 % — ABNORMAL HIGH (ref 11.5–15.5)
WBC: 19.7 10*3/uL — ABNORMAL HIGH (ref 4.0–10.5)
nRBC: 0 % (ref 0.0–0.2)

## 2022-01-28 LAB — COMPREHENSIVE METABOLIC PANEL
ALT: 17 U/L (ref 0–44)
AST: 18 U/L (ref 15–41)
Albumin: 3.3 g/dL — ABNORMAL LOW (ref 3.5–5.0)
Alkaline Phosphatase: 75 U/L (ref 38–126)
Anion gap: 8 (ref 5–15)
BUN: 13 mg/dL (ref 8–23)
CO2: 22 mmol/L (ref 22–32)
Calcium: 8.3 mg/dL — ABNORMAL LOW (ref 8.9–10.3)
Chloride: 99 mmol/L (ref 98–111)
Creatinine, Ser: 0.7 mg/dL (ref 0.44–1.00)
GFR, Estimated: >60 mL/min (ref >60)
Glucose, Bld: 162 mg/dL — ABNORMAL HIGH (ref 70–99)
Potassium: 4.3 mmol/L (ref 3.5–5.1)
Sodium: 129 mmol/L — ABNORMAL LOW (ref 135–145)
Total Bilirubin: 1.6 mg/dL — ABNORMAL HIGH (ref 0.3–1.2)
Total Protein: 7.2 g/dL (ref 6.5–8.1)

## 2022-01-28 LAB — PROTIME-INR
INR: 2.7 — ABNORMAL HIGH (ref 0.8–1.2)
Prothrombin Time: 28.7 seconds — ABNORMAL HIGH (ref 11.4–15.2)

## 2022-01-28 LAB — TROPONIN I (HIGH SENSITIVITY): Troponin I (High Sensitivity): 7 ng/L (ref <18)

## 2022-01-28 LAB — LIPASE, BLOOD: Lipase: 19 U/L (ref 11–51)

## 2022-01-28 MED ORDER — ONDANSETRON HCL 4 MG/2ML IJ SOLN
4.0000 mg | Freq: Once | INTRAMUSCULAR | Status: AC
  Administered 2022-01-28: 4 mg via INTRAVENOUS
  Filled 2022-01-28: qty 2

## 2022-01-28 MED ORDER — MORPHINE SULFATE (PF) 2 MG/ML IV SOLN
2.0000 mg | Freq: Once | INTRAVENOUS | Status: AC
  Administered 2022-01-28: 2 mg via INTRAVENOUS
  Filled 2022-01-28: qty 1

## 2022-01-28 MED ORDER — IOHEXOL 300 MG/ML  SOLN
80.0000 mL | Freq: Once | INTRAMUSCULAR | Status: AC | PRN
  Administered 2022-01-28: 80 mL via INTRAVENOUS

## 2022-01-28 MED ORDER — SODIUM CHLORIDE 0.9 % IV BOLUS
500.0000 mL | Freq: Once | INTRAVENOUS | Status: AC
  Administered 2022-01-28: 500 mL via INTRAVENOUS

## 2022-01-28 MED ORDER — PIPERACILLIN-TAZOBACTAM 3.375 G IVPB 30 MIN
3.3750 g | Freq: Once | INTRAVENOUS | Status: AC
  Administered 2022-01-28: 3.375 g via INTRAVENOUS
  Filled 2022-01-28: qty 50

## 2022-01-28 MED ORDER — ONDANSETRON 4 MG PO TBDP
4.0000 mg | ORAL_TABLET | Freq: Once | ORAL | Status: AC | PRN
  Administered 2022-01-28: 4 mg via ORAL
  Filled 2022-01-28: qty 1

## 2022-01-28 NOTE — ED Triage Notes (Signed)
Pt reports upper abd pain and emesis since yesterday. No diarrhea, shortness of breath or lightheadedness.

## 2022-01-28 NOTE — ED Notes (Signed)
Difficult lab draw, left radial artery punctured for venous labs. Patient tolerated well pressure held at site x .

## 2022-01-28 NOTE — ED Notes (Signed)
Repositioned pulse/ O2 to other hand Placed hand warmer on hand  Warm blankets Reported off to respiratory

## 2022-01-28 NOTE — ED Provider Notes (Signed)
[facility] EMERGENCY DEPARTMENT Provider Note   CSN: [id number] Arrival date & time: 01/28/22  1234     History  Chief Complaint  Patient presents with   Abdominal Pain   Emesis    Jill AusSarah J Redford is a 81 y.o. female with past medical history significant for A-fib, hypertension, diabetes, CHF, acid reflux, previous cholecystitis, choledocholithiasis with history of ERCP without cholecystectomy who presents with concern for upper abdominal pain, nausea, vomiting since yesterday.  She denies any diarrhea, dark tarry stools, constipation.  She reports that she has been feeling generally unwell, denies fever or chills.  Denies any chest pain, shortness of breath, chest pain.  She received Zofran in triage and reports that she is feeling somewhat improved with her nausea, my evaluation.  She rates her pain 7/10.  She reports that it is coming on and off to some extent but is more severe yesterday than today.  He does take anticoagulation for her A-fib, she is on warfarin.   Abdominal Pain Associated symptoms: vomiting   Emesis Associated symptoms: abdominal pain        Home Medications Prior to Admission medications   Medication Sig Start Date End Date Taking? Authorizing Provider  albuterol (PROVENTIL HFA;VENTOLIN HFA) 108 (90 BASE) MCG/ACT inhaler Inhale 2 puffs into the lungs every 6 (six) hours as needed for wheezing or shortness of breath.    [provider]  alendronate (FOSAMAX) 35 MG tablet Take 35 mg by mouth every Monday. Take with a full glass of water on an empty stomach.    [provider]  aspirin 81 MG chewable tablet Chew 81 mg by mouth daily.    [provider]  brimonidine (ALPHAGAN) 0.2 % ophthalmic solution Place 1 drop into the left eye in the morning and at bedtime. 10/21/19   [provider]  busPIRone (BUSPAR) 10 MG tablet Take 10 mg by mouth 2 (two) times daily. 08/04/20   [provider]  calcium-vitamin D  (OSCAL WITH D) 500-200 MG-UNIT per tablet Take 1 tablet by mouth.    [provider]  dorzolamide (TRUSOPT) 2 % ophthalmic solution Place 1 drop into both eyes at bedtime. 08/03/20   [provider]  furosemide (LASIX) 20 MG tablet Take 1 tablet (20 mg total) by mouth daily. 01/28/21   Hollice EspyKrishnan, Sendil K, MD  gabapentin (NEURONTIN) 300 MG capsule Take 300 mg by mouth at bedtime. 08/10/20   [provider]  magnesium oxide (MAG-OX) 400 (241.3 Mg) MG tablet Take 400 mg by mouth daily. 07/08/20   [provider]  MATZIM LA 240 MG 24 hr tablet Take 240 mg by mouth daily. 08/03/20   [provider]  metFORMIN (GLUCOPHAGE) 500 MG tablet Take 500-1,000 mg by mouth See admin instructions. Take 2 tablets with morning meal, and 1 tablet with evening meals 08/24/20   [provider]  metoprolol tartrate (LOPRESSOR) 25 MG tablet Take 1 tablet (25 mg total) by mouth 2 (two) times daily. 08/17/20   Rhetta MuraSamtani, Jai-Gurmukh, MD  omeprazole (PRILOSEC) 20 MG capsule Take 20 mg by mouth daily.    [provider]  polyethylene glycol powder (GLYCOLAX/MIRALAX) 17 GM/SCOOP powder Take 17 g by mouth daily as needed for constipation.    [provider]  predniSONE (DELTASONE) 2.5 MG tablet Take 10 mg by mouth daily with breakfast.    [provider]  silver sulfADIAZINE (SILVADENE) 1 % cream Apply 1 application topically daily as needed for irritation. to scalp  06/16/20   [provider]  warfarin (COUMADIN) 2 MG tablet On Saturday and Sunday, take 2 tabs (4mg ), then resume normal regimen Take 1 tablet (2 mg) daily except Take 2 tablets (4 mg) on (Tues & Fri) 01/28/21   01/30/21, MD      Allergies    Patient has no known allergies.    Review of Systems   Review of Systems  Gastrointestinal:  Positive for abdominal pain and vomiting.  All other systems reviewed and are negative.   Physical Exam Updated Vital Signs BP (!) 162/108    Pulse (!) 35   Temp 98.7 F (37.1 C) (Oral)   Resp (!) 27   SpO2 (!) 78%  Physical Exam Vitals and nursing note reviewed.  Constitutional:      General: She is not in acute distress.    Appearance: Normal appearance.     Comments: Patient minimally ill-appearing, not in any acute distress on my evaluation.  She is nontoxic-appearing.  HENT:     Head: Normocephalic and atraumatic.  Eyes:     General:        Right eye: No discharge.        Left eye: No discharge.  Cardiovascular:     Rate and Rhythm: Normal rate. Rhythm irregular.     Heart sounds: No murmur heard.    No friction rub. No gallop.  Pulmonary:     Effort: Pulmonary effort is normal.     Breath sounds: Normal breath sounds.  Abdominal:     General: Bowel sounds are normal.     Palpations: Abdomen is soft.     Comments: Patient with some tenderness palpation most focally in the epigastric and right upper quadrant regions.  No rebound, rigidity, guarding.  Normal bowel sounds throughout.  Some tenderness throughout the rest of the abdomen without any focal tenderness in the lower.  No distention noted abdomen  Skin:    General: Skin is warm and dry.     Capillary Refill: Capillary refill takes less than 2 seconds.  Neurological:     Mental Status: She is alert and oriented to person, place, and time.  Psychiatric:        Mood and Affect: Mood normal.        Behavior: Behavior normal.     ED Results / Procedures / Treatments   Labs (all labs ordered are listed, but only abnormal results are displayed) Labs Reviewed  COMPREHENSIVE METABOLIC PANEL - Abnormal; Notable for the following components:      Result Value   Sodium 129 (*)    Glucose, Bld 162 (*)    Calcium 8.3 (*)    Albumin 3.3 (*)    Total Bilirubin 1.6 (*)    All other components within normal limits  CBC - Abnormal; Notable for the following components:   WBC 19.7 (*)    RDW 15.8 (*)    All other components within normal limits  PROTIME-INR  - Abnormal; Notable for the following components:   Prothrombin Time 28.7 (*)    INR 2.7 (*)    All other components within normal limits  LIPASE, BLOOD  URINALYSIS, ROUTINE W REFLEX MICROSCOPIC  TROPONIN I (HIGH SENSITIVITY)    EKG EKG Interpretation  Date/Time:  Saturday January 28 2022 13:31:41 EDT Ventricular Rate:  83 PR Interval:    QRS Duration: 78 QT Interval:  370 QTC Calculation: 434 R Axis:   62 Text Interpretation: Atrial fibrillation Abnormal ECG When  compared with ECG of 11-Jan-2022 18:04, No significant change since last tracing Confirmed by Meridee Score [phone]) on 01/28/2022 1:59:11 PM  Radiology CT ABDOMEN PELVIS W CONTRAST  Result Date: 01/28/2022 CLINICAL DATA:  Acute nonlocalized abdominal pain. EXAM: CT ABDOMEN AND PELVIS WITH CONTRAST TECHNIQUE: Multidetector CT imaging of the abdomen and pelvis was performed using the standard protocol following bolus administration of intravenous contrast. RADIATION DOSE REDUCTION: This exam was performed according to the departmental dose-optimization program which includes automated exposure control, adjustment of the mA and/or kV according to patient size and/or use of iterative reconstruction technique. CONTRAST:  100mL OMNIPAQUE IOHEXOL 300 MG/ML  SOLN COMPARISON:  CT 01/20/2021 FINDINGS: Lower chest: Chronic cardiomegaly. Minor atelectasis in the lung bases without focal airspace disease or pleural effusion. Hepatobiliary: The gallbladder is distended. There is pericholecystic fat stranding and gallbladder wall thickening. Small focus of air in the gallbladder fundus likely within the gallbladder lumen. Layering density in the gallbladder may represent stones or sludge. Pneumobilia, likely related to prior ERCP. No abnormal distension of the intra or extrahepatic biliary tree. Diffuse hepatic steatosis with areas of focal fatty sparing adjacent to the gallbladder fossa. Pancreas: Parenchymal atrophy. No ductal dilatation or  inflammation. Spleen: Normal in size without focal abnormality. Adrenals/Urinary Tract: No adrenal nodule. Ptotic right kidney. Fluid adjacent to the right kidney may represent renal inflammation or be reactive related to adjacent gallbladder inflammation. There is homogeneous renal enhancement. No hydronephrosis or renal calculi. Simple cyst in the left kidney needs no further follow-up. Unremarkable urinary bladder. Stomach/Bowel: Tiny hiatal hernia. No abnormal gastric distension. Fluid in the distal stomach and the duodenum without wall thickening or inflammation. No small bowel obstruction or inflammation. Normal appendix. Left colonic diverticulosis, no diverticulitis or acute colonic inflammation. Vascular/Lymphatic: Advanced aortic and branch atherosclerosis. No aortic aneurysm. The SMA is densely calcified but patent. 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 Reconstitution of the common femoral artery. IVC filter in place. No bulky adenopathy. Reproductive: Normal for age uterine atrophy.  No adnexal mass. Other: Fat stranding and small amount of free fluid in the right upper quadrant and right pararenal space. No free air or focal fluid collection. Small fat containing umbilical hernia. Musculoskeletal: There are no acute or suspicious osseous abnormalities. 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 Lower lumbar facet hypertrophy IMPRESSION: 1. Findings highly suspicious for acute cholecystitis. Gallbladder wall thickening or pericholecystic fat stranding and inflammation. Layering density in the gallbladder may represent stones or sludge. Pneumobilia is likely related to prior ERCP, no definite biliary dilatation. 2. Right perinephric fluid is felt to be reactive related to adjacent gallbladder inflammation, there are no other findings of right renal inflammation or obstruction. 3. Hepatic steatosis. 4. Left colonic diverticulosis  without diverticulitis. Aortic Atherosclerosis (ICD10-I70.0). Electronically Signed   By: Narda Rutherford M.D.   On: 01/28/2022 18:32   DG Chest 2 View  Result Date: 01/28/2022 CLINICAL DATA:  Pain and vomiting for 1 day. EXAM: CHEST - 2 VIEW COMPARISON:  01/11/2022 and prior radiographs FINDINGS: Cardiomediastinal silhouette is unchanged with cardiomegaly and tortuous thoracic aorta again noted. There is no evidence of focal airspace disease, pulmonary edema, suspicious pulmonary nodule/mass, pleural effusion, or pneumothorax. No acute bony abnormalities are identified. IVC filter within the abdomen again noted. IMPRESSION: Cardiomegaly without evidence of acute cardiopulmonary disease. Electronically Signed   By: Harmon Pier M.D.   On: 01/28/2022 14:07    Procedures Procedures  Medications Ordered in ED Medications  ondansetron (ZOFRAN-ODT) disintegrating tablet 4 mg (4 mg Oral Given 01/28/22 1346)  morphine (PF) 2 MG/ML injection 2 mg (2 mg Intravenous Given 01/28/22 1604)  sodium chloride 0.9 % bolus 500 mL (0 mLs Intravenous Stopped 01/28/22 1705)  iohexol (OMNIPAQUE) 300 MG/ML solution 80 mL (80 mLs Intravenous Contrast Given 01/28/22 1753)  piperacillin-tazobactam (ZOSYN) IVPB 3.375 g (0 g Intravenous Stopped 01/28/22 1935)    ED Course/ Medical Decision Making/ A&P Clinical Course as of 01/28/22 2011  Sat Jan 28, 2022  1856 Magnus Ivan -- to consult. Medicine admit. [CP]    Clinical Course User Index [CP] Olene Floss, PA-C                           Medical Decision Making Amount and/or Complexity of Data Reviewed Labs: ordered. Radiology: ordered.  Risk Prescription drug management.   This patient is a 81 y.o. female who presents to the ED for concern of abdominal pain, nausea, vomiting, this involves an extensive number of treatment options, and is a complaint that carries with it a high risk of complications and morbidity. The emergent differential diagnosis prior  to evaluation includes, but is not limited to, mesenteric ischemia, cholecystitis, diverticulitis, or other intra-abdominal infection, pancreatitis, choledocholithiasis, liver abscess, or other abnormality.   This is not an exhaustive differential.   Past Medical History / Co-morbidities / Social History: Cholecystitis, choledocholithiasis, ERCP with removal of stones, patient has not had cholecystectomy in the past.  She is on warfarin secondary to A-fib, she has hypertension, diabetes, heart failure  Additional history: Chart reviewed. Pertinent results include: Reviewed previous emergency department visits, previous surgical exploration of gallbladder, including lab work, imaging from previous visits and hospitalizations  Physical Exam: Physical exam performed. The pertinent findings include: Patient is most focally tender in the right upper quadrant.  Throughout her stay she is Opyd any respiratory distress, however when she is resting she is having intermittent tachypnea, as well as oxygen desaturations that I think are likely related to brief apneic episodes.  She has bilateral clear breath sounds on my exam.  Low clinical suspicion for acute pulmonary edema as she is with stable oxygen saturation on recheck anytime she is aroused.  Lab Tests: I ordered, and personally interpreted labs.  The pertinent results include: Patient was significant leukocytosis, white blood cells 19.7.  She is mildly hyponatremic with sodium 129.  She does have elevated total bilirubin at 1.6.  Initial troponin is normal at 7 with no report of chest pain per patient.  Her INR is therapeutic for warfarin at 2.7 today.  Lipase normal today.   Imaging Studies: I ordered imaging studies including plain film chest x-ray, CT abdomen pelvis with contrast. I independently visualized and interpreted imaging which showed cardiomegaly without pleural effusion or signs of heart failure decompensation, CT abdomen pelvis is  concerning for acute cholecystectomy, she does have some pneumobilia, thought secondary to her previous ERCP procedures. I agree with the radiologist interpretation.   Cardiac Monitoring:  The patient was maintained on a cardiac monitor.  My attending physician Dr. Rubin Payor viewed and interpreted the cardiac monitored which showed an underlying rhythm of: afib, normal rate. I agree with this interpretation.   Medications: I ordered medication including small fluid bolus for hyponatremia, clinical dehydration, morphine for pain, Zosyn for antibiotic treatment of cholecystitis. Reevaluation of the patient after these medicines showed that the patient improved. I have reviewed the  patients home medicines and have made adjustments as needed.  Pain is somewhat improved on reevaluation, however patient will require further management for her ongoing gallbladder issues and abdominal pain.  Consultations Obtained: I requested consultation with the surgeon, spoke with Dr. Magnus Ivan, hospitalist spoke with Dr. Margo Aye,  and discussed lab and imaging findings as well as pertinent plan - they recommend: Dr. Magnus Ivan requests medicine admit with surgery to consult due to patient's complicated gallbladder history.  Dr. Margo Aye agrees to admission at this time.   Disposition: After consideration of the diagnostic results and the patients response to treatment, I feel that patient would benefit from admission as discussed above  I discussed this case with my attending physician Dr. Rubin Payor who cosigned this note including patient's presenting symptoms, physical exam, and planned diagnostics and interventions. Attending physician stated agreement with plan or made changes to plan which were implemented.    Final Clinical Impression(s) / ED Diagnoses Final diagnoses:  Cholecystitis    Rx / DC Orders ED Discharge Orders     None         [place] 01/28/22 2011    Benjiman Core,  MD 01/28/22 2251

## 2022-01-29 ENCOUNTER — Encounter (HOSPITAL_COMMUNITY): Payer: Self-pay | Admitting: Internal Medicine

## 2022-01-29 ENCOUNTER — Inpatient Hospital Stay (HOSPITAL_COMMUNITY): Payer: Medicare Other

## 2022-01-29 DIAGNOSIS — I482 Chronic atrial fibrillation, unspecified: Secondary | ICD-10-CM | POA: Diagnosis present

## 2022-01-29 DIAGNOSIS — I4892 Unspecified atrial flutter: Secondary | ICD-10-CM | POA: Diagnosis present

## 2022-01-29 DIAGNOSIS — F32A Depression, unspecified: Secondary | ICD-10-CM | POA: Diagnosis present

## 2022-01-29 DIAGNOSIS — I4891 Unspecified atrial fibrillation: Secondary | ICD-10-CM | POA: Diagnosis not present

## 2022-01-29 DIAGNOSIS — I10 Essential (primary) hypertension: Secondary | ICD-10-CM | POA: Diagnosis not present

## 2022-01-29 DIAGNOSIS — K8000 Calculus of gallbladder with acute cholecystitis without obstruction: Secondary | ICD-10-CM | POA: Diagnosis present

## 2022-01-29 DIAGNOSIS — A4159 Other Gram-negative sepsis: Secondary | ICD-10-CM | POA: Diagnosis present

## 2022-01-29 DIAGNOSIS — E1151 Type 2 diabetes mellitus with diabetic peripheral angiopathy without gangrene: Secondary | ICD-10-CM | POA: Diagnosis present

## 2022-01-29 DIAGNOSIS — J449 Chronic obstructive pulmonary disease, unspecified: Secondary | ICD-10-CM | POA: Diagnosis present

## 2022-01-29 DIAGNOSIS — L109 Pemphigus, unspecified: Secondary | ICD-10-CM | POA: Diagnosis present

## 2022-01-29 DIAGNOSIS — Z86718 Personal history of other venous thrombosis and embolism: Secondary | ICD-10-CM | POA: Diagnosis not present

## 2022-01-29 DIAGNOSIS — A4151 Sepsis due to Escherichia coli [E. coli]: Secondary | ICD-10-CM | POA: Diagnosis not present

## 2022-01-29 DIAGNOSIS — Z79899 Other long term (current) drug therapy: Secondary | ICD-10-CM | POA: Diagnosis not present

## 2022-01-29 DIAGNOSIS — K219 Gastro-esophageal reflux disease without esophagitis: Secondary | ICD-10-CM | POA: Diagnosis present

## 2022-01-29 DIAGNOSIS — Z7901 Long term (current) use of anticoagulants: Secondary | ICD-10-CM | POA: Diagnosis not present

## 2022-01-29 DIAGNOSIS — I5032 Chronic diastolic (congestive) heart failure: Secondary | ICD-10-CM | POA: Diagnosis present

## 2022-01-29 DIAGNOSIS — Z7983 Long term (current) use of bisphosphonates: Secondary | ICD-10-CM | POA: Diagnosis not present

## 2022-01-29 DIAGNOSIS — I110 Hypertensive heart disease with heart failure: Secondary | ICD-10-CM | POA: Diagnosis present

## 2022-01-29 DIAGNOSIS — K810 Acute cholecystitis: Secondary | ICD-10-CM | POA: Diagnosis present

## 2022-01-29 DIAGNOSIS — Z7984 Long term (current) use of oral hypoglycemic drugs: Secondary | ICD-10-CM | POA: Diagnosis not present

## 2022-01-29 DIAGNOSIS — F419 Anxiety disorder, unspecified: Secondary | ICD-10-CM | POA: Diagnosis present

## 2022-01-29 DIAGNOSIS — E1140 Type 2 diabetes mellitus with diabetic neuropathy, unspecified: Secondary | ICD-10-CM | POA: Diagnosis present

## 2022-01-29 DIAGNOSIS — Z7982 Long term (current) use of aspirin: Secondary | ICD-10-CM | POA: Diagnosis not present

## 2022-01-29 DIAGNOSIS — Z87891 Personal history of nicotine dependence: Secondary | ICD-10-CM | POA: Diagnosis not present

## 2022-01-29 DIAGNOSIS — Z872 Personal history of diseases of the skin and subcutaneous tissue: Secondary | ICD-10-CM | POA: Diagnosis not present

## 2022-01-29 DIAGNOSIS — Z7952 Long term (current) use of systemic steroids: Secondary | ICD-10-CM | POA: Diagnosis not present

## 2022-01-29 DIAGNOSIS — Z833 Family history of diabetes mellitus: Secondary | ICD-10-CM | POA: Diagnosis not present

## 2022-01-29 DIAGNOSIS — D72825 Bandemia: Secondary | ICD-10-CM | POA: Diagnosis not present

## 2022-01-29 DIAGNOSIS — E1165 Type 2 diabetes mellitus with hyperglycemia: Secondary | ICD-10-CM | POA: Diagnosis present

## 2022-01-29 LAB — HEPATIC FUNCTION PANEL
ALT: 16 U/L (ref 0–44)
AST: 16 U/L (ref 15–41)
Albumin: 3.4 g/dL — ABNORMAL LOW (ref 3.5–5.0)
Alkaline Phosphatase: 79 U/L (ref 38–126)
Bilirubin, Direct: 0.4 mg/dL — ABNORMAL HIGH (ref 0.0–0.2)
Indirect Bilirubin: 1.7 mg/dL — ABNORMAL HIGH (ref 0.3–0.9)
Total Bilirubin: 2.1 mg/dL — ABNORMAL HIGH (ref 0.3–1.2)
Total Protein: 7.7 g/dL (ref 6.5–8.1)

## 2022-01-29 LAB — CBC WITH DIFFERENTIAL/PLATELET
Abs Immature Granulocytes: 0.19 10*3/uL — ABNORMAL HIGH (ref 0.00–0.07)
Basophils Absolute: 0.1 10*3/uL (ref 0.0–0.1)
Basophils Relative: 0 %
Eosinophils Absolute: 0 10*3/uL (ref 0.0–0.5)
Eosinophils Relative: 0 %
HCT: 44.1 % (ref 36.0–46.0)
Hemoglobin: 15.1 g/dL — ABNORMAL HIGH (ref 12.0–15.0)
Immature Granulocytes: 1 %
Lymphocytes Relative: 5 %
Lymphs Abs: 1.3 10*3/uL (ref 0.7–4.0)
MCH: 28.9 pg (ref 26.0–34.0)
MCHC: 34.2 g/dL (ref 30.0–36.0)
MCV: 84.5 fL (ref 80.0–100.0)
Monocytes Absolute: 2.7 10*3/uL — ABNORMAL HIGH (ref 0.1–1.0)
Monocytes Relative: 11 %
Neutro Abs: 20.8 10*3/uL — ABNORMAL HIGH (ref 1.7–7.7)
Neutrophils Relative %: 83 %
Platelets: 257 10*3/uL (ref 150–400)
RBC: 5.22 MIL/uL — ABNORMAL HIGH (ref 3.87–5.11)
RDW: 16.3 % — ABNORMAL HIGH (ref 11.5–15.5)
WBC: 25 10*3/uL — ABNORMAL HIGH (ref 4.0–10.5)
nRBC: 0 % (ref 0.0–0.2)

## 2022-01-29 LAB — PROTIME-INR
INR: 2.7 — ABNORMAL HIGH (ref 0.8–1.2)
Prothrombin Time: 28.5 seconds — ABNORMAL HIGH (ref 11.4–15.2)

## 2022-01-29 LAB — GLUCOSE, CAPILLARY
Glucose-Capillary: 123 mg/dL — ABNORMAL HIGH (ref 70–99)
Glucose-Capillary: 126 mg/dL — ABNORMAL HIGH (ref 70–99)
Glucose-Capillary: 126 mg/dL — ABNORMAL HIGH (ref 70–99)
Glucose-Capillary: 132 mg/dL — ABNORMAL HIGH (ref 70–99)
Glucose-Capillary: 137 mg/dL — ABNORMAL HIGH (ref 70–99)
Glucose-Capillary: 148 mg/dL — ABNORMAL HIGH (ref 70–99)
Glucose-Capillary: 98 mg/dL (ref 70–99)

## 2022-01-29 LAB — MRSA NEXT GEN BY PCR, NASAL: MRSA by PCR Next Gen: DETECTED — AB

## 2022-01-29 LAB — BRAIN NATRIURETIC PEPTIDE: B Natriuretic Peptide: 733.4 pg/mL — ABNORMAL HIGH (ref 0.0–100.0)

## 2022-01-29 LAB — TYPE AND SCREEN
ABO/RH(D): B POS
Antibody Screen: NEGATIVE

## 2022-01-29 LAB — CBG MONITORING, ED: Glucose-Capillary: 163 mg/dL — ABNORMAL HIGH (ref 70–99)

## 2022-01-29 LAB — BASIC METABOLIC PANEL
Anion gap: 9 (ref 5–15)
BUN: 13 mg/dL (ref 8–23)
CO2: 24 mmol/L (ref 22–32)
Calcium: 8.6 mg/dL — ABNORMAL LOW (ref 8.9–10.3)
Chloride: 99 mmol/L (ref 98–111)
Creatinine, Ser: 0.88 mg/dL (ref 0.44–1.00)
GFR, Estimated: >60 mL/min (ref >60)
Glucose, Bld: 130 mg/dL — ABNORMAL HIGH (ref 70–99)
Potassium: 4.5 mmol/L (ref 3.5–5.1)
Sodium: 132 mmol/L — ABNORMAL LOW (ref 135–145)

## 2022-01-29 LAB — TROPONIN I (HIGH SENSITIVITY): Troponin I (High Sensitivity): 11 ng/L (ref <18)

## 2022-01-29 LAB — LACTIC ACID, PLASMA: Lactic Acid, Venous: 1.9 mmol/L (ref 0.5–1.9)

## 2022-01-29 LAB — TSH: TSH: 0.757 u[IU]/mL (ref 0.350–4.500)

## 2022-01-29 MED ORDER — BUSPIRONE HCL 10 MG PO TABS
10.0000 mg | ORAL_TABLET | Freq: Two times a day (BID) | ORAL | Status: DC
  Administered 2022-01-29 – 2022-02-02 (×9): 10 mg via ORAL
  Filled 2022-01-29 (×9): qty 1

## 2022-01-29 MED ORDER — METOPROLOL TARTRATE 5 MG/5ML IV SOLN: 5.0000 mg | Freq: Four times a day (QID) | INTRAVENOUS | Status: DC

## 2022-01-29 MED ORDER — PREDNISONE 10 MG PO TABS
10.0000 mg | ORAL_TABLET | Freq: Every day | ORAL | Status: DC
Start: 2022-01-30 — End: 2022-01-29

## 2022-01-29 MED ORDER — METOPROLOL TARTRATE 5 MG/5ML IV SOLN
5.0000 mg | Freq: Once | INTRAVENOUS | Status: AC
  Administered 2022-01-29: 5 mg via INTRAVENOUS
  Filled 2022-01-29: qty 5

## 2022-01-29 MED ORDER — DILTIAZEM HCL ER 240 MG PO TB24
240.0000 mg | ORAL_TABLET | Freq: Every day | ORAL | Status: DC
Start: 2022-01-29 — End: 2022-01-29

## 2022-01-29 MED ORDER — FLUTICASONE PROPIONATE 50 MCG/ACT NA SUSP
2.0000 | Freq: Every day | NASAL | Status: DC | PRN
Start: 2022-01-29 — End: 2022-02-02

## 2022-01-29 MED ORDER — ALBUTEROL SULFATE (2.5 MG/3ML) 0.083% IN NEBU: 3.0000 mL | INHALATION_SOLUTION | Freq: Four times a day (QID) | RESPIRATORY_TRACT | Status: DC | PRN

## 2022-01-29 MED ORDER — ONDANSETRON HCL 4 MG/2ML IJ SOLN
4.0000 mg | Freq: Once | INTRAMUSCULAR | Status: AC
  Administered 2022-01-29: 4 mg via INTRAVENOUS
  Filled 2022-01-29: qty 2

## 2022-01-29 MED ORDER — SODIUM CHLORIDE 0.9 % IV BOLUS
500.0000 mL | Freq: Once | INTRAVENOUS | Status: AC
  Administered 2022-01-29: 500 mL via INTRAVENOUS

## 2022-01-29 MED ORDER — FLUOCINONIDE 0.05 % EX OINT: 1.0000 | TOPICAL_OINTMENT | Freq: Two times a day (BID) | CUTANEOUS | Status: DC | PRN

## 2022-01-29 MED ORDER — BRIMONIDINE TARTRATE 0.2 % OP SOLN
1.0000 [drp] | Freq: Three times a day (TID) | OPHTHALMIC | Status: DC
Start: 2022-01-29 — End: 2022-02-02
  Administered 2022-01-29 – 2022-02-02 (×14): 1 [drp] via OPHTHALMIC
  Filled 2022-01-29 (×2): qty 5

## 2022-01-29 MED ORDER — METOPROLOL SUCCINATE ER 25 MG PO TB24: 12.5000 mg | ORAL_TABLET | Freq: Every day | ORAL | Status: DC

## 2022-01-29 MED ORDER — METHYLPREDNISOLONE SODIUM SUCC 40 MG IJ SOLR: 20.0000 mg | Freq: Every day | INTRAMUSCULAR | Status: DC

## 2022-01-29 MED ORDER — CHLORHEXIDINE GLUCONATE CLOTH 2 % EX PADS
6.0000 | MEDICATED_PAD | Freq: Every day | CUTANEOUS | Status: DC
  Administered 2022-01-29 – 2022-02-02 (×7): 6 via TOPICAL

## 2022-01-29 MED ORDER — INSULIN ASPART 100 UNIT/ML IJ SOLN: 0.0000 [IU] | INTRAMUSCULAR | Status: DC

## 2022-01-29 MED ORDER — GABAPENTIN 300 MG PO CAPS
300.0000 mg | ORAL_CAPSULE | Freq: Every day | ORAL | Status: DC
  Administered 2022-01-29 – 2022-02-01 (×4): 300 mg via ORAL
  Filled 2022-01-29 (×4): qty 1

## 2022-01-29 MED ORDER — DORZOLAMIDE HCL 2 % OP SOLN
1.0000 [drp] | Freq: Every day | OPHTHALMIC | Status: DC
Start: 2022-01-29 — End: 2022-02-02
  Administered 2022-01-30 – 2022-02-01 (×4): 1 [drp] via OPHTHALMIC
  Filled 2022-01-29 (×2): qty 10

## 2022-01-29 MED ORDER — METOPROLOL TARTRATE 25 MG PO TABS
25.0000 mg | ORAL_TABLET | Freq: Once | ORAL | Status: DC
  Filled 2022-01-29: qty 1

## 2022-01-29 MED ORDER — ORAL CARE MOUTH RINSE: 15.0000 mL | OROMUCOSAL | Status: DC | PRN

## 2022-01-29 MED ORDER — PREDNISONE 5 MG PO TABS
7.5000 mg | ORAL_TABLET | Freq: Every day | ORAL | Status: DC
  Administered 2022-01-29 – 2022-02-02 (×5): 7.5 mg via ORAL
  Filled 2022-01-29 (×5): qty 2

## 2022-01-29 MED ORDER — ACETAMINOPHEN 650 MG RE SUPP
650.0000 mg | Freq: Once | RECTAL | Status: AC
  Administered 2022-01-31: 650 mg via RECTAL
  Filled 2022-01-29 (×2): qty 1

## 2022-01-29 MED ORDER — MORPHINE SULFATE (PF) 2 MG/ML IV SOLN
0.5000 mg | INTRAVENOUS | Status: DC | PRN
  Administered 2022-01-29 – 2022-01-31 (×6): 0.5 mg via INTRAVENOUS
  Filled 2022-01-29 (×6): qty 1

## 2022-01-29 MED ORDER — METOPROLOL TARTRATE 12.5 MG HALF TABLET
12.5000 mg | ORAL_TABLET | Freq: Two times a day (BID) | ORAL | Status: DC
  Administered 2022-01-29 – 2022-01-30 (×3): 12.5 mg via ORAL
  Filled 2022-01-29 (×3): qty 1

## 2022-01-29 MED ORDER — MUPIROCIN 2 % EX OINT
1.0000 | TOPICAL_OINTMENT | Freq: Two times a day (BID) | CUTANEOUS | Status: DC
  Administered 2022-01-29 – 2022-02-02 (×8): 1 via NASAL
  Filled 2022-01-29: qty 22

## 2022-01-29 MED ORDER — PIPERACILLIN-TAZOBACTAM 3.375 G IVPB
3.3750 g | Freq: Three times a day (TID) | INTRAVENOUS | Status: DC
  Administered 2022-01-29 – 2022-02-01 (×11): 3.375 g via INTRAVENOUS
  Filled 2022-01-29 (×11): qty 50

## 2022-01-29 NOTE — Progress Notes (Signed)
Pharmacy Antibiotic Note  Jill Crane is a 81 y.o. female admitted on 01/28/2022 with abdominal pain, ongoing for the last 2 days with multiple episode of N/V.  Marland Kitchen  Pharmacy has been consulted for zosyn dosing.  Plan: Zosyn 3.375g IV Q8H infused over 4hrs. Pharmacy will sign off and follow peripherally     Temp (24hrs), Avg:98.9 F (37.2 C), Min:98 F (36.7 C), Max:100.5 F (38.1 C)  Recent Labs  Lab 01/28/22 1525  WBC 19.7*  CREATININE 0.70    CrCl cannot be calculated (Unknown ideal weight.).    No Known Allergies   Thank you for allowing pharmacy to be a part of this patient's care.  Arley Phenix RPh 01/29/2022, 3:55 AM

## 2022-01-29 NOTE — Progress Notes (Signed)
PROGRESS NOTE  Jill Crane  DOB: Dec 05, 1940  PCP: Burman Freestone, MD TFT:[phone]  DOA: 01/28/2022  LOS: 0 days  Hospital Day: 2  Brief narrative: Jill Crane is a 81 y.o. female with PMH significant for DM2, HTN, A-fib, CHF, COPD and a history of ERCP for choledocholithiasis. Patient presented to the ED on 7/15 with complaint of right upper quadrant abdominal pain for 2 days associated with multiple episode of nausea, vomiting. Initial afebrile in the ED, hemodynamically stable.  Breathing on room air Initial labs showed sodium low at 129, liver enzymes including alk phos normal but bilirubin slightly elevated at 1.6, WC count elevated to 19.7 CT abdomen and pelvis showed gallbladder wall thickening, pericholecystic fat stranding and inflammation, gallstones, suggestive of acute cholecystitis. ED physician discussed the case with general surgery Dr. Ninfa Linden Admitted to hospitalist service for further evaluation management  Subjective: Patient was seen and examined this morning.  Pleasant elderly African-American female.  Sleeping.  Open eyes on verbal command.  Not in distress.  Daughter at bedside. Chart reviewed. Overnight, patient had a fever of 100.5, she also was in RVR up to 112 bpm  Assessment and plan: Sepsis secondary to acute cholecystitis -Presented with right upper quadrant pain.  Imaging suggestive of acute cholecystitis -Met sepsis criteria with fever, leukocytosis, tachycardia -Seen by general surgery.  Per general surgery, patient will eventually require lap chole or percutaneous cholecystectomy tube placement. -I tried her on regular diet this morning and patient started having abdominal pain after that.  Switch to clear liquid diet only for now. -Currently on IV Zosyn Recent Labs  Lab 01/28/22 1525 01/29/22 0500 01/29/22 0551  WBC 19.7* 25.0*  --   LATICACIDVEN  --   --  1.9    Atrial flutter with RVR  -History of A-fib/flutter on Coumadin at  home  -Overnight, patient was in a flutter up to 130.  5 mg IV metoprolol was given and ordered for every 6 hours.   -PTA on metoprolol 12.5 mg mg twice daily.  Increase it to 25 mg twice daily for this morning.  If continues to remain RVR, will consider Cardizem drip. -Anticoagulated with Coumadin with INR in therapeutic range between 2-3 -Keep Coumadin on hold for possible procedure in next 1 to 2 days.  Once INR comes down to less than 2, will start on heparin drip. Recent Labs  Lab 01/28/22 1909 01/29/22 0500  INR 2.7* 2.7*    Type 2 diabetes mellitus Diabetic neuropathy -A1c 7 on 01/21/2021 -PTA on metformin twice daily. -Currently metformin on hold.  Continue sliding scale insulin coverage. -Continue Neurontin 300 mg at bedtime Recent Labs  Lab 01/29/22 0122 01/29/22 0347 01/29/22 0734 01/29/22 0835 01/29/22 1148  GLUCAP 163* 126* 132* 137* 148*   Chronic diastolic CHF  Essential hypertension  -Per care everywhere, echo done at [facility] on May 2023 showed EF normal at 60 to 65% with mild LVH. -PTA on metoprolol 12.5 mg twice daily, Lasix 20 mg daily -Heart failure currently is compensated. -Continue metoprolol.  Keep Lasix on hold for now.  Avoid IV fluid for now.  History of pemphigus  -on chronic steroids with prednisone 7.5 mg daily  Peripheral vascular disease  -Per daughter, patient is no longer on aspirin or Pletal.  Anxiety/depression -Continue buspirone 10 mg twice daily  Goals of care   Code Status: Full Code   Mobility: PT eval.  Skin assessment: None    Nutritional status:  There is no height  or weight on file to calculate BMI.          Diet:  Diet Order             Diet clear liquid Room service appropriate? Yes; Fluid consistency: Thin  Diet effective now                   DVT prophylaxis:  SCDs Start: 01/29/22 0327   Antimicrobials: IV Zosyn Fluid: None Consultants: Surgery Family Communication: Daughter at bedside  Status  is: Inpatient  Continue in-hospital care because: May need surgery Level of care: Stepdown   Dispo: The patient is from: Home              Anticipated d/c is to: Pending clinical course              Patient currently is not medically stable to d/c.   Difficult to place patient No     Infusions:   piperacillin-tazobactam (ZOSYN)  IV 3.375 g (01/29/22 0503)    Scheduled Meds:  acetaminophen  650 mg Rectal Once   brimonidine  1 drop Left Eye Q8H   busPIRone  10 mg Oral BID   Chlorhexidine Gluconate Cloth  6 each Topical Daily   dorzolamide  1 drop Both Eyes QHS   gabapentin  300 mg Oral QHS   insulin aspart  0-6 Units Subcutaneous Q4H   metoprolol tartrate  12.5 mg Oral BID   predniSONE  7.5 mg Oral Q breakfast    PRN meds: albuterol, fluocinonide ointment, fluticasone, morphine injection, mouth rinse   Antimicrobials: Anti-infectives (From admission, onward)    Start     Dose/Rate Route Frequency Ordered Stop   01/29/22 0445  piperacillin-tazobactam (ZOSYN) IVPB 3.375 g        3.375 g 12.5 mL/hr over 240 Minutes Intravenous Every 8 hours 01/29/22 0354     01/28/22 1900  piperacillin-tazobactam (ZOSYN) IVPB 3.375 g        3.375 g 100 mL/hr over 30 Minutes Intravenous  Once 01/28/22 1846 01/28/22 1935       Objective: Vitals:   01/29/22 0900 01/29/22 1000  BP: 123/78 130/80  Pulse: (!) 108 (!) 33  Resp: (!) 31 (!) 29  Temp:    SpO2: 95% 95%    Intake/Output Summary (Last 24 hours) at 01/29/2022 1157 Last data filed at 01/29/2022 0840 Gross per 24 hour  Intake 516.91 ml  Output 0 ml  Net 516.91 ml   There were no vitals filed for this visit. Weight change:  There is no height or weight on file to calculate BMI.   Physical Exam: General exam: Pleasant, elderly African-American female Skin: No rashes, lesions or ulcers. HEENT: Atraumatic, normocephalic, no obvious bleeding Lungs: Clear to auscultation bilaterally CVS: Tachycardic, irregular rhythm, no  murmur GI/Abd mild right upper quadrant tenderness CNS: Opens eyes to verbal command, able to communicate Psychiatry: Mood appropriate Extremities: No pedal edema, no calf tenderness  Data Review: I have personally reviewed the laboratory data and studies available.  F/u labs ordered Unresulted Labs (From admission, onward)     Start     Ordered   01/30/22 0500  CBC with Differential/Platelet  Tomorrow morning,   R       Question:  Specimen collection method  Answer:  Lab=Lab collect   01/29/22 1157   01/30/22 0500  Comprehensive metabolic panel  Tomorrow morning,   R       Question:  Specimen collection method  Answer:  Lab=Lab collect   01/29/22 1157   01/29/22 0827  MRSA Next Gen by PCR, Nasal  Once,   R        01/29/22 0826            Signed, Terrilee Croak, MD [facility] 01/29/2022

## 2022-01-29 NOTE — H&P (Addendum)
History and Physical    Jill Crane XVQ:[id number] DOB: January 20, 1941 DOA: 01/28/2022  PCP: Zoila Shutter, MD  Patient coming from: Home.  Chief Complaint: Abdominal pain.  History obtained from patient's daughter, patient, prior records.  HPI: Jill Crane is a 81 y.o. female with history of atrial fibrillation on Coumadin, diastolic CHF, diabetes mellitus.  History of ERCP for choledocholithiasis presents to the ER at med center with complaints of abdominal pain.  Patient's pain has been ongoing for the last 2 days pain is mostly in the right upper quadrant with multiple episodes of nausea vomiting but no diarrhea.  Pain is irrespective of food.  ED Course: In the ER patient is found to be febrile and blood work showed leukocytosis.  On exam patient does have right upper quadrant tenderness.  CT abdomen pelvis done shows features concerning for acute cholecystitis.  ER physician did discuss with on-call general surgeon Dr. Magnus Ivan will be seeing patient in consult.  At the time of my exam patient's heart rate is fluctuating between 101 130 in atrial flutter.  Review of Systems: As per HPI, rest all negative.   Past Medical History:  Diagnosis Date   Atrial fibrillation with normal ventricular rate (HCC)    CHF (congestive heart failure) (HCC)    COPD (chronic obstructive pulmonary disease) (HCC)    Diabetes mellitus without complication (HCC)    Hx of blood clots    Hypertension     Past Surgical History:  Procedure Laterality Date   ENDOSCOPIC RETROGRADE CHOLANGIOPANCREATOGRAPHY (ERCP) WITH PROPOFOL N/A 01/22/2021   Procedure: ENDOSCOPIC RETROGRADE CHOLANGIOPANCREATOGRAPHY (ERCP) WITH PROPOFOL;  Surgeon: Hilarie Fredrickson, MD;  Location: [facility] ENDOSCOPY;  Service: Endoscopy;  Laterality: N/A;   ESOPHAGOGASTRODUODENOSCOPY (EGD) WITH PROPOFOL N/A 01/28/2021   Procedure: ESOPHAGOGASTRODUODENOSCOPY (EGD) WITH PROPOFOL;  Surgeon: Iva Boop, MD;  Location: [facility] ENDOSCOPY;  Service:  Endoscopy;  Laterality: N/A;   REMOVAL OF STONES  01/22/2021   Procedure: REMOVAL OF STONES;  Surgeon: Hilarie Fredrickson, MD;  Location: [facility] ENDOSCOPY;  Service: Endoscopy;;   SPHINCTEROTOMY  01/22/2021   Procedure: Dennison Mascot;  Surgeon: Hilarie Fredrickson, MD;  Location: [facility] ENDOSCOPY;  Service: Endoscopy;;     reports that she has quit smoking. Her smoking use included cigarettes. She has never used smokeless tobacco. She reports that she does not drink alcohol and does not use drugs.  No Known Allergies  Family History  Problem Relation Age of Onset   Stroke Mother    Diabetes Mother    Stroke Father     Prior to Admission medications   Medication Sig Start Date End Date Taking? Authorizing Provider  alendronate (FOSAMAX) 35 MG tablet Take 35 mg by mouth every Monday. Take with a full glass of water on an empty stomach.   Yes [provider]  brimonidine (ALPHAGAN) 0.2 % ophthalmic solution Place 1 drop into the left eye in the morning and at bedtime. 10/21/19  Yes [provider]  busPIRone (BUSPAR) 10 MG tablet Take 10 mg by mouth 2 (two) times daily. 08/04/20  Yes [provider]  calcium-vitamin D (OSCAL WITH D) 500-200 MG-UNIT per tablet Take 1 tablet by mouth.   Yes [provider]  metoprolol tartrate (LOPRESSOR) 25 MG tablet Take 1 tablet (25 mg total) by mouth 2 (two) times daily. 08/17/20  Yes Rhetta Mura, MD  albuterol (PROVENTIL HFA;VENTOLIN HFA) 108 (90 BASE) MCG/ACT inhaler Inhale 2 puffs into the lungs every 6 (six) hours as needed for  wheezing or shortness of breath.    [provider]  aspirin 81 MG chewable tablet Chew 81 mg by mouth daily.    [provider]  dorzolamide (TRUSOPT) 2 % ophthalmic solution Place 1 drop into both eyes at bedtime. 08/03/20   [provider]  furosemide (LASIX) 20 MG tablet Take 1 tablet (20 mg total) by mouth daily. 01/28/21   Hollice Espy, MD  gabapentin (NEURONTIN) 300 MG  capsule Take 300 mg by mouth at bedtime. 08/10/20   [provider]  magnesium oxide (MAG-OX) 400 (241.3 Mg) MG tablet Take 400 mg by mouth daily. 07/08/20   [provider]  MATZIM LA 240 MG 24 hr tablet Take 240 mg by mouth daily. 08/03/20   [provider]  metFORMIN (GLUCOPHAGE) 500 MG tablet Take 500-1,000 mg by mouth See admin instructions. Take 2 tablets with morning meal, and 1 tablet with evening meals 08/24/20   [provider]  omeprazole (PRILOSEC) 20 MG capsule Take 20 mg by mouth daily.    [provider]  polyethylene glycol powder (GLYCOLAX/MIRALAX) 17 GM/SCOOP powder Take 17 g by mouth daily as needed for constipation.    [provider]  predniSONE (DELTASONE) 2.5 MG tablet Take 10 mg by mouth daily with breakfast.    [provider]  silver sulfADIAZINE (SILVADENE) 1 % cream Apply 1 application topically daily as needed for irritation. to scalp 06/16/20   [provider]  warfarin (COUMADIN) 2 MG tablet On Saturday and Sunday, take 2 tabs (4mg ), then resume normal regimen Take 1 tablet (2 mg) daily except Take 2 tablets (4 mg) on (Tues & Fri) 01/28/21   01/30/21, MD    Physical Exam: Constitutional: Moderately built and nourished. Vitals:   01/28/22 2326 01/29/22 0055 01/29/22 0128 01/29/22 0215  BP:  (!) 110/98 107/73 (!) 142/104  Pulse: 99 (!) 108 (!) 106 100  Resp: (!) 26 (!) 31 (!) 22 20  Temp:    (!) 100.5 F (38.1 C)  TempSrc:    Oral  SpO2:  95%  99%   Eyes: Anicteric no pallor. ENMT: No discharge from the ears eyes nose and mouth. Neck: No mass felt.  No neck rigidity. Respiratory: No rhonchi or crepitations. Cardiovascular: S1-S2 heard. Abdomen: Right upper quadrant tenderness no guarding or rigidity. Musculoskeletal: No edema. Skin: No rash. Neurologic: Alert awake oriented name and place moving all extremities. Psychiatric: Oriented to name and place.   Labs on Admission: I  have personally reviewed following labs and imaging studies  CBC: Recent Labs  Lab 01/28/22 1525  WBC 19.7*  HGB 12.9  HCT 39.2  MCV 83.1  PLT 208   Basic Metabolic Panel: Recent Labs  Lab 01/28/22 1525  NA 129*  K 4.3  CL 99  CO2 22  GLUCOSE 162*  BUN 13  CREATININE 0.70  CALCIUM 8.3*   GFR: CrCl cannot be calculated (Unknown ideal weight.). Liver Function Tests: Recent Labs  Lab 01/28/22 1525  AST 18  ALT 17  ALKPHOS 75  BILITOT 1.6*  PROT 7.2  ALBUMIN 3.3*   Recent Labs  Lab 01/28/22 1525  LIPASE 19   No results for input(s): "AMMONIA" in the last 168 hours. Coagulation Profile: Recent Labs  Lab 01/28/22 1909  INR 2.7*   Cardiac Enzymes: No results for input(s): "CKTOTAL", "CKMB", "CKMBINDEX", "TROPONINI" in the last 168 hours. BNP (last 3 results) No results for input(s): "PROBNP" in the last 8760 hours. HbA1C: No  results for input(s): "HGBA1C" in the last 72 hours. CBG: Recent Labs  Lab 01/29/22 0122  GLUCAP 163*   Lipid Profile: No results for input(s): "CHOL", "HDL", "LDLCALC", "TRIG", "CHOLHDL", "LDLDIRECT" in the last 72 hours. Thyroid Function Tests: No results for input(s): "TSH", "T4TOTAL", "FREET4", "T3FREE", "THYROIDAB" in the last 72 hours. Anemia Panel: No results for input(s): "VITAMINB12", "FOLATE", "FERRITIN", "TIBC", "IRON", "RETICCTPCT" in the last 72 hours. Urine analysis:    Component Value Date/Time   COLORURINE YELLOW 01/11/2022 1812   APPEARANCEUR CLEAR 01/11/2022 1812   LABSPEC 1.010 01/11/2022 1812   PHURINE 5.5 01/11/2022 1812   GLUCOSEU NEGATIVE 01/11/2022 1812   HGBUR TRACE (A) 01/11/2022 1812   BILIRUBINUR NEGATIVE 01/11/2022 1812   KETONESUR NEGATIVE 01/11/2022 1812   PROTEINUR NEGATIVE 01/11/2022 1812   UROBILINOGEN 1.0 08/16/2014 0423   NITRITE NEGATIVE 01/11/2022 1812   LEUKOCYTESUR TRACE (A) 01/11/2022 1812   Sepsis Labs: @LABRCNTIP (procalcitonin:4,lacticidven:4) )No results found for this or  any previous visit (from the past 240 hour(s)).   Radiological Exams on Admission: CT ABDOMEN PELVIS W CONTRAST  Result Date: 01/28/2022 CLINICAL DATA:  Acute nonlocalized abdominal pain. EXAM: CT ABDOMEN AND PELVIS WITH CONTRAST TECHNIQUE: Multidetector CT imaging of the abdomen and pelvis was performed using the standard protocol following bolus administration of intravenous contrast. RADIATION DOSE REDUCTION: This exam was performed according to the departmental dose-optimization program which includes automated exposure control, adjustment of the mA and/or kV according to patient size and/or use of iterative reconstruction technique. CONTRAST:  33mL OMNIPAQUE IOHEXOL 300 MG/ML  SOLN COMPARISON:  CT 01/20/2021 FINDINGS: Lower chest: Chronic cardiomegaly. Minor atelectasis in the lung bases without focal airspace disease or pleural effusion. Hepatobiliary: The gallbladder is distended. There is pericholecystic fat stranding and gallbladder wall thickening. Small focus of air in the gallbladder fundus likely within the gallbladder lumen. Layering density in the gallbladder may represent stones or sludge. Pneumobilia, likely related to prior ERCP. No abnormal distension of the intra or extrahepatic biliary tree. Diffuse hepatic steatosis with areas of focal fatty sparing adjacent to the gallbladder fossa. Pancreas: Parenchymal atrophy. No ductal dilatation or inflammation. Spleen: Normal in size without focal abnormality. Adrenals/Urinary Tract: No adrenal nodule. Ptotic right kidney. Fluid adjacent to the right kidney may represent renal inflammation or be reactive related to adjacent gallbladder inflammation. There is homogeneous renal enhancement. No hydronephrosis or renal calculi. Simple cyst in the left kidney needs no further follow-up. Unremarkable urinary bladder. Stomach/Bowel: Tiny hiatal hernia. No abnormal gastric distension. Fluid in the distal stomach and the duodenum without wall thickening or  inflammation. No small bowel obstruction or inflammation. Normal appendix. Left colonic diverticulosis, no diverticulitis or acute colonic inflammation. Vascular/Lymphatic: Advanced aortic and branch atherosclerosis. No aortic aneurysm. The SMA is densely calcified but patent. 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 Reconstitution of the common femoral artery. IVC filter in place. No bulky adenopathy. Reproductive: Normal for age uterine atrophy.  No adnexal mass. Other: Fat stranding and small amount of free fluid in the right upper quadrant and right pararenal space. No free air or focal fluid collection. Small fat containing umbilical hernia. Musculoskeletal: There are no acute or suspicious osseous abnormalities. 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 Lower lumbar facet hypertrophy IMPRESSION: 1. Findings highly suspicious for acute cholecystitis. Gallbladder wall thickening or pericholecystic fat stranding and inflammation. Layering density in the gallbladder may represent stones or sludge. Pneumobilia is likely related to prior ERCP, no definite  biliary dilatation. 2. Right perinephric fluid is felt to be reactive related to adjacent gallbladder inflammation, there are no other findings of right renal inflammation or obstruction. 3. Hepatic steatosis. 4. Left colonic diverticulosis without diverticulitis. Aortic Atherosclerosis (ICD10-I70.0). Electronically Signed   By: Narda Rutherford M.D.   On: 01/28/2022 18:32   DG Chest 2 View  Result Date: 01/28/2022 CLINICAL DATA:  Pain and vomiting for 1 day. EXAM: CHEST - 2 VIEW COMPARISON:  01/11/2022 and prior radiographs FINDINGS: Cardiomediastinal silhouette is unchanged with cardiomegaly and tortuous thoracic aorta again noted. There is no evidence of focal airspace disease, pulmonary edema, suspicious pulmonary nodule/mass, pleural effusion, or pneumothorax. No acute bony  abnormalities are identified. IVC filter within the abdomen again noted. IMPRESSION: Cardiomegaly without evidence of acute cardiopulmonary disease. Electronically Signed   By: Harmon Pier M.D.   On: 01/28/2022 14:07    EKG: Independently reviewed.  Atrial flutter.  Assessment/Plan Principal Problem:   Acute cholecystitis Active Problems:   Diabetes (HCC)   Current chronic use of systemic steroids   Chronic diastolic CHF (congestive heart failure) (HCC)   Atrial fibrillation with RVR (HCC)    Acute cholecystitis with possible developing sepsis -patient's symptoms and clinical findings and radiology findings consistent with acute cholecystitis.  Patient has had prior history of a ERCP for choledocholithiasis.  At this time general surgeon Dr. Magnus Ivan has been notified.  We will keep patient n.p.o. and on IV antibiotics.  Since patient is mildly hypoxic at this time after receiving final mL fluid bolus we will hold off further fluids and get chest x-ray and check BNP. Atrial flutter with RVR -patient's heart rate has been fluctuating between 101 130.  In atrial flutter.  I have ordered 1 dose of 5 mg IV metoprolol and kept patient on scheduled dose of IV metoprolol every 6 hours.  If heart rate does not improve may need Cardizem infusion.  Holding Coumadin due to anticipated surgery patient's family and patient is agreeable.  Repeat INR has been ordered if is high will give vitamin K.  Patient's family updated about the plan and they are in agreement.  Patient and family also made aware that holding Coumadin has risk of stroke. Diabetes mellitus type 2 last hemoglobin A1c in May 2023 was 6.9.  We will keep patient on sliding scale coverage for now. Chronic diastolic CHF last EF at this time I do not have access to.  Patient is not in distress but mildly hypoxic.  I am checking repeat chest x-ray BNP and troponin. 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 Peripheral vascular disease usually takes Pletal.  Since patient has acute cholecystitis and is in A-fib with RVR will need close monitoring and inpatient status.   DVT prophylaxis: SCDs for now holding Coumadin in anticipation of surgery. Code Status: Full code. Family Communication: Patient's daughter at the bedside. Disposition Plan: Home. Consults called: General surgery. Admission status: Inpatient.   Eduard Clos MD [facility] Pager [phone].  If 7PM-7AM, please contact night-coverage [email or web address] Password [facility]  01/29/2022, 3:29 AM

## 2022-01-29 NOTE — Plan of Care (Signed)
  Problem: Education: Goal: Knowledge of General Education information will improve Description: Including pain rating scale, medication(s)/side effects and non-pharmacologic comfort measures Outcome: Progressing   Problem: Health Behavior/Discharge Planning: Goal: Ability to manage health-related needs will improve Outcome: Progressing   Problem: Clinical Measurements: Goal: Respiratory complications will improve Outcome: Progressing   Problem: Activity: Goal: Risk for activity intolerance will decrease Outcome: Progressing   Problem: Nutrition: Goal: Adequate nutrition will be maintained Outcome: Progressing   Problem: Coping: Goal: Ability to adjust to condition or change in health will improve Outcome: Progressing   Problem: Nutritional: Goal: Maintenance of adequate nutrition will improve Outcome: Progressing Goal: Progress toward achieving an optimal weight will improve Outcome: Progressing

## 2022-01-29 NOTE — ED Notes (Signed)
Spoke with md about high b/ps. Meds ordered

## 2022-01-29 NOTE — Consult Note (Signed)
CC: Abdominal pain Referring: Dr. Arvilla Market  HPI: This is an 81 year old female with multiple medical problems including A-fib on Coumadin, diastolic CHF, and diabetes who presents with cholecystitis.  She has a history of having had an ERCP last year for choledocholithiasis.  A laparoscopic cholecystectomy was recommended after this but she refused.  She now presents with a 2 to 3-day history of right upper quadrant abdominal pain with nausea and vomiting.  She denies fevers.  She denies shortness of breath.  She denies chest pain  Past Medical History:  Diagnosis Date   Atrial fibrillation with normal ventricular rate (HCC)    CHF (congestive heart failure) (HCC)    COPD (chronic obstructive pulmonary disease) (HCC)    Diabetes mellitus without complication (HCC)    Hx of blood clots    Hypertension     Past Surgical History:  Procedure Laterality Date   ENDOSCOPIC RETROGRADE CHOLANGIOPANCREATOGRAPHY (ERCP) WITH PROPOFOL N/A 01/22/2021   Procedure: ENDOSCOPIC RETROGRADE CHOLANGIOPANCREATOGRAPHY (ERCP) WITH PROPOFOL;  Surgeon: Hilarie Fredrickson, MD;  Location[facility] ENDOSCOPY;  Service: Endoscopy;  Laterality: N/A;   ESOPHAGOGASTRODUODENOSCOPY (EGD) WITH PROPOFOL N/A 01/28/2021   Procedure: ESOPHAGOGASTRODUODENOSCOPY (EGD) WITH PROPOFOL;  Surgeon: Iva Boop, MD;  Location: [facility] ENDOSCOPY;  Service: Endoscopy;  Laterality: N/A;   REMOVAL OF STONES  01/22/2021   Procedure: REMOVAL OF STONES;  Surgeon: Hilarie Fredrickson, MD;  Location: [facility] ENDOSCOPY;  Service: Endoscopy;;   SPHINCTEROTOMY  01/22/2021   Procedure: Dennison Mascot;  Surgeon: Hilarie Fredrickson, MD;  Location: [facility] ENDOSCOPY;  Service: Endoscopy;;    Family History  Problem Relation Age of Onset   Stroke Mother    Diabetes Mother    Stroke Father     Social:  reports that she has quit smoking. Her smoking use included cigarettes. She has never used smokeless tobacco. She reports that she does not drink alcohol and does not use  drugs.  Allergies: No Known Allergies  Medications: I have reviewed the patient's current medications.  Results for orders placed or performed during the hospital encounter of 01/28/22 (from the past 48 hour(s))  Lipase, blood     Status: None   Collection Time: 01/28/22  3:25 PM  Result Value Ref Range   Lipase 19 11 - 51 U/L    Comment: Performed at [facility], [address] [place], [place] [address]  Comprehensive metabolic panel     Status: Abnormal   Collection Time: 01/28/22  3:25 PM  Result Value Ref Range   Sodium 129 (L) 135 - 145 mmol/L   Potassium 4.3 3.5 - 5.1 mmol/L   Chloride 99 98 - 111 mmol/L   CO2 22 22 - 32 mmol/L   Glucose, Bld 162 (H) 70 - 99 mg/dL    Comment: Glucose reference range applies only to samples taken after fasting for at least 8 hours.   BUN 13 8 - 23 mg/dL   Creatinine, Ser 3.29 0.44 - 1.00 mg/dL   Calcium 8.3 (L) 8.9 - 10.3 mg/dL   Total Protein 7.2 6.5 - 8.1 g/dL   Albumin 3.3 (L) 3.5 - 5.0 g/dL   AST 18 15 - 41 U/L   ALT 17 0 - 44 U/L   Alkaline Phosphatase 75 38 - 126 U/L   Total Bilirubin 1.6 (H) 0.3 - 1.2 mg/dL   GFR, Estimated >92 >42 mL/min    Comment: (NOTE) Calculated using the CKD-EPI Creatinine Equation (2021)    Anion gap 8 5 -  15    Comment: Performed at [facility], [address] [place], [place] [address]  CBC     Status: Abnormal   Collection Time: 01/28/22  3:25 PM  Result Value Ref Range   WBC 19.7 (H) 4.0 - 10.5 K/uL   RBC 4.72 3.87 - 5.11 MIL/uL   Hemoglobin 12.9 12.0 - 15.0 g/dL   HCT 78.4 69.6 - 29.5 %   MCV 83.1 80.0 - 100.0 fL   MCH 27.3 26.0 - 34.0 pg   MCHC 32.9 30.0 - 36.0 g/dL   RDW 28.4 (H) 13.2 - 44.0 %   Platelets 208 150 - 400 K/uL   nRBC 0.0 0.0 - 0.2 %    Comment: Performed at [facility], [address] [place], [place] [address]  Troponin I (High Sensitivity)     Status: None   Collection Time: 01/28/22  3:25 PM  Result Value Ref Range   Troponin I  (High Sensitivity) 7 <18 ng/L    Comment: (NOTE) Elevated high sensitivity troponin I (hsTnI) values and significant  changes across serial measurements may suggest ACS but many other  chronic and acute conditions are known to elevate hsTnI results.  Refer to the "Links" section for chest pain algorithms and additional  guidance. Performed at [facility], [address] [place], [place] [address]   Protime-INR     Status: Abnormal   Collection Time: 01/28/22  7:09 PM  Result Value Ref Range   Prothrombin Time 28.7 (H) 11.4 - 15.2 seconds   INR 2.7 (H) 0.8 - 1.2    Comment: (NOTE) INR goal varies based on device and disease states. Performed at [facility], [address] [place], [place] [address]   CBG monitoring, ED     Status: Abnormal   Collection Time: 01/29/22  1:22 AM  Result Value Ref Range   Glucose-Capillary 163 (H) 70 - 99 mg/dL    Comment: Glucose reference range applies only to samples taken after fasting for at least 8 hours.  Glucose, capillary     Status: Abnormal   Collection Time: 01/29/22  3:47 AM  Result Value Ref Range   Glucose-Capillary 126 (H) 70 - 99 mg/dL    Comment: Glucose reference range applies only to samples taken after fasting for at least 8 hours.  Type and screen [facility]     Status: None (Preliminary result)   Collection Time: 01/29/22  5:00 AM  Result Value Ref Range   ABO/RH(D) PENDING    Antibody Screen PENDING    Sample Expiration      02/01/2022,2359 Performed at [facility], [address] [place], [place] [address]   Basic metabolic panel     Status: Abnormal   Collection Time: 01/29/22  5:00 AM  Result Value Ref Range   Sodium 132 (L) 135 - 145 mmol/L   Potassium 4.5 3.5 - 5.1 mmol/L   Chloride 99 98 - 111 mmol/L   CO2 24 22 - 32 mmol/L   Glucose, Bld 130 (H) 70 - 99 mg/dL    Comment: Glucose reference range applies only to samples taken after fasting for at  least 8 hours.   BUN 13 8 - 23 mg/dL   Creatinine, Ser 6.38 0.44 - 1.00 mg/dL   Calcium 8.6 (L) 8.9 - 10.3 mg/dL   GFR, Estimated >75 >64 mL/min    Comment: (NOTE) Calculated using the CKD-EPI  Creatinine Equation (2021)    Anion gap 9 5 - 15    Comment: Performed at [facility], [address] [place], [place] [address]  Brain natriuretic peptide     Status: Abnormal   Collection Time: 01/29/22  5:00 AM  Result Value Ref Range   B Natriuretic Peptide 733.4 (H) 0.0 - 100.0 pg/mL    Comment: Performed at [facility], [address] [place], [place] [address]  CBC with Differential/Platelet     Status: Abnormal   Collection Time: 01/29/22  5:00 AM  Result Value Ref Range   WBC 25.0 (H) 4.0 - 10.5 K/uL   RBC 5.22 (H) 3.87 - 5.11 MIL/uL   Hemoglobin 15.1 (H) 12.0 - 15.0 g/dL   HCT 86.5 78.4 - 69.6 %   MCV 84.5 80.0 - 100.0 fL   MCH 28.9 26.0 - 34.0 pg   MCHC 34.2 30.0 - 36.0 g/dL   RDW 29.5 (H) 28.4 - 13.2 %   Platelets 257 150 - 400 K/uL   nRBC 0.0 0.0 - 0.2 %   Neutrophils Relative % 83 %   Neutro Abs 20.8 (H) 1.7 - 7.7 K/uL   Lymphocytes Relative 5 %   Lymphs Abs 1.3 0.7 - 4.0 K/uL   Monocytes Relative 11 %   Monocytes Absolute 2.7 (H) 0.1 - 1.0 K/uL   Eosinophils Relative 0 %   Eosinophils Absolute 0.0 0.0 - 0.5 K/uL   Basophils Relative 0 %   Basophils Absolute 0.1 0.0 - 0.1 K/uL   Immature Granulocytes 1 %   Abs Immature Granulocytes 0.19 (H) 0.00 - 0.07 K/uL    Comment: Performed at [facility], [address] [place], [place] [address]  Hepatic function panel     Status: Abnormal   Collection Time: 01/29/22  5:00 AM  Result Value Ref Range   Total Protein 7.7 6.5 - 8.1 g/dL   Albumin 3.4 (L) 3.5 - 5.0 g/dL   AST 16 15 - 41 U/L   ALT 16 0 - 44 U/L   Alkaline Phosphatase 79 38 - 126 U/L   Total Bilirubin 2.1 (H) 0.3 - 1.2 mg/dL   Bilirubin, Direct 0.4 (H) 0.0 - 0.2 mg/dL   Indirect Bilirubin 1.7 (H) 0.3  - 0.9 mg/dL    Comment: Performed at [facility], [address] [place], [place] [address]  Protime-INR     Status: Abnormal   Collection Time: 01/29/22  5:00 AM  Result Value Ref Range   Prothrombin Time 28.5 (H) 11.4 - 15.2 seconds   INR 2.7 (H) 0.8 - 1.2    Comment: (NOTE) INR goal varies based on device and disease states. Performed at [facility], [address] [place], [place] [address]   Troponin I (High Sensitivity)     Status: None   Collection Time: 01/29/22  5:00 AM  Result Value Ref Range   Troponin I (High Sensitivity) 11 <18 ng/L    Comment: (NOTE) Elevated high sensitivity troponin I (hsTnI) values and significant  changes across serial measurements may suggest ACS but many other  chronic and acute conditions are known to elevate hsTnI results.  Refer to the "Links" section for chest pain algorithms and additional  guidance. Performed at [facility], [address], [place], [place] [address]   Lactic acid, plasma     Status: None   Collection Time: 01/29/22  5:51 AM  Result Value Ref Range  Lactic Acid, Venous 1.9 0.5 - 1.9 mmol/L    Comment: Performed at [facility], [address] [place], [place] [address]    DG CHEST PORT 1 VIEW  Result Date: 01/29/2022 CLINICAL DATA:  81 year old female with shortness of breath. EXAM: PORTABLE CHEST 1 VIEW COMPARISON:  CT Abdomen and Pelvis 01/28/2022 and earlier. FINDINGS: Portable AP semi upright view at 0439 hours. Stable cardiomegaly and mediastinal contours. Levoconvex thoracic scoliosis. Mildly lower lung volumes. Allowing for portable technique the lungs are clear. No pneumothorax or pleural effusion. Visualized tracheal air column is within normal limits. No acute osseous abnormality identified. IMPRESSION: Stable cardiomegaly. No acute cardiopulmonary abnormality. Electronically Signed   By: Odessa Fleming M.D.   On: 01/29/2022 05:17   CT  ABDOMEN PELVIS W CONTRAST  Result Date: 01/28/2022 CLINICAL DATA:  Acute nonlocalized abdominal pain. EXAM: CT ABDOMEN AND PELVIS WITH CONTRAST TECHNIQUE: Multidetector CT imaging of the abdomen and pelvis was performed using the standard protocol following bolus administration of intravenous contrast. RADIATION DOSE REDUCTION: This exam was performed according to the departmental dose-optimization program which includes automated exposure control, adjustment of the mA and/or kV according to patient size and/or use of iterative reconstruction technique. CONTRAST:  80mL OMNIPAQUE IOHEXOL 300 MG/ML  SOLN COMPARISON:  CT 01/20/2021 FINDINGS: Lower chest: Chronic cardiomegaly. Minor atelectasis in the lung bases without focal airspace disease or pleural effusion. Hepatobiliary: The gallbladder is distended. There is pericholecystic fat stranding and gallbladder wall thickening. Small focus of air in the gallbladder fundus likely within the gallbladder lumen. Layering density in the gallbladder may represent stones or sludge. Pneumobilia, likely related to prior ERCP. No abnormal distension of the intra or extrahepatic biliary tree. Diffuse hepatic steatosis with areas of focal fatty sparing adjacent to the gallbladder fossa. Pancreas: Parenchymal atrophy. No ductal dilatation or inflammation. Spleen: Normal in size without focal abnormality. Adrenals/Urinary Tract: No adrenal nodule. Ptotic right kidney. Fluid adjacent to the right kidney may represent renal inflammation or be reactive related to adjacent gallbladder inflammation. There is homogeneous renal enhancement. No hydronephrosis or renal calculi. Simple cyst in the left kidney needs no further follow-up. Unremarkable urinary bladder. Stomach/Bowel: Tiny hiatal hernia. No abnormal gastric distension. Fluid in the distal stomach and the duodenum without wall thickening or inflammation. No small bowel obstruction or inflammation. Normal appendix. Left colonic  diverticulosis, no diverticulitis or acute colonic inflammation. Vascular/Lymphatic: Advanced aortic and branch atherosclerosis. No aortic aneurysm. The SMA is densely calcified but patent. 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 Reconstitution of the common femoral artery. IVC filter in place. No bulky adenopathy. Reproductive: Normal for age uterine atrophy.  No adnexal mass. Other: Fat stranding and small amount of free fluid in the right upper quadrant and right pararenal space. No free air or focal fluid collection. Small fat containing umbilical hernia. Musculoskeletal: There are no acute or suspicious osseous abnormalities. 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 Lower lumbar facet hypertrophy IMPRESSION: 1. Findings highly suspicious for acute cholecystitis. Gallbladder wall thickening or pericholecystic fat stranding and inflammation. Layering density in the gallbladder may represent stones or sludge. Pneumobilia is likely related to prior ERCP, no definite biliary dilatation. 2. Right perinephric fluid is felt to be reactive related to adjacent gallbladder inflammation, there are no other findings of right renal inflammation or obstruction. 3. Hepatic steatosis. 4. Left colonic diverticulosis without diverticulitis. Aortic Atherosclerosis (ICD10-I70.0). Electronically Signed   By: Ivette Loyal.D.  On: 01/28/2022 18:32   DG Chest 2 View  Result Date: 01/28/2022 CLINICAL DATA:  Pain and vomiting for 1 day. EXAM: CHEST - 2 VIEW COMPARISON:  01/11/2022 and prior radiographs FINDINGS: Cardiomediastinal silhouette is unchanged with cardiomegaly and tortuous thoracic aorta again noted. There is no evidence of focal airspace disease, pulmonary edema, suspicious pulmonary nodule/mass, pleural effusion, or pneumothorax. No acute bony abnormalities are identified. IVC filter within the abdomen again noted. IMPRESSION:  Cardiomegaly without evidence of acute cardiopulmonary disease. Electronically Signed   By: Harmon PierJeffrey  Hu M.D.   On: 01/28/2022 14:07    ROS - all of the below systems have been reviewed with the patient and positives are indicated with bold text General: chills, fever or night sweats Eyes: blurry vision or double vision ENT: epistaxis or sore throat Allergy/Immunology: itchy/watery eyes or nasal congestion Hematologic/Lymphatic: bleeding problems, blood clots or swollen lymph nodes Endocrine: temperature intolerance or unexpected weight changes Breast: new or changing breast lumps or nipple discharge Resp: cough, shortness of breath, or wheezing CV: chest pain or dyspnea on exertion GI: as per HPI GU: dysuria, trouble voiding, or hematuria MSK: joint pain or joint stiffness Neuro: TIA or stroke symptoms Derm: pruritus and skin lesion changes Psych: anxiety and depression  PE Blood pressure 113/79, pulse 85, temperature 100.2 F (37.9 C), temperature source Oral, resp. rate 16, SpO2 99 %. Constitutional: NAD; elderly in appearance and moderately frail Eyes: Moist conjunctiva; no lid lag; anicteric; PERRL Neck: Trachea midline; no thyromegaly Lungs: Respiratory effort slightly increased; no tactile fremitus CV: IRR; no palpable thrills; no pitting edema GI: Abd tenderness with guarding in the right upper quadrant; no palpable hepatosplenomegaly MSK: Normal range of motion of extremities; no clubbing/cyanosis Psychiatric: Appropriate affect; alert and oriented x3 Lymphatic: No palpable cervical or axillary lymphadenopathy  Results for orders placed or performed during the hospital encounter of 01/28/22 (from the past 48 hour(s))  Lipase, blood     Status: None   Collection Time: 01/28/22  3:25 PM  Result Value Ref Range   Lipase 19 11 - 51 U/L    Comment: Performed at [facility], [address] [place], [place] [address]  Comprehensive metabolic panel     Status:  Abnormal   Collection Time: 01/28/22  3:25 PM  Result Value Ref Range   Sodium 129 (L) 135 - 145 mmol/L   Potassium 4.3 3.5 - 5.1 mmol/L   Chloride 99 98 - 111 mmol/L   CO2 22 22 - 32 mmol/L   Glucose, Bld 162 (H) 70 - 99 mg/dL    Comment: Glucose reference range applies only to samples taken after fasting for at least 8 hours.   BUN 13 8 - 23 mg/dL   Creatinine, Ser 7.820.70 0.44 - 1.00 mg/dL   Calcium 8.3 (L) 8.9 - 10.3 mg/dL   Total Protein 7.2 6.5 - 8.1 g/dL   Albumin 3.3 (L) 3.5 - 5.0 g/dL   AST 18 15 - 41 U/L   ALT 17 0 - 44 U/L   Alkaline Phosphatase 75 38 - 126 U/L   Total Bilirubin 1.6 (H) 0.3 - 1.2 mg/dL   GFR, Estimated >95>60 >62>60 mL/min    Comment: (NOTE) Calculated using the CKD-EPI Creatinine Equation (2021)    Anion gap 8 5 - 15    Comment: Performed at [facility] [place], [address] [place], [place] [address]  CBC     Status: Abnormal   Collection Time: 01/28/22  3:25 PM  Result Value Ref Range   WBC 19.7 (H) 4.0 - 10.5 K/uL   RBC 4.72 3.87 - 5.11 MIL/uL   Hemoglobin 12.9 12.0 - 15.0 g/dL   HCT 16.1 09.6 - 04.5 %   MCV 83.1 80.0 - 100.0 fL   MCH 27.3 26.0 - 34.0 pg   MCHC 32.9 30.0 - 36.0 g/dL   RDW 40.9 (H) 81.1 - 91.4 %   Platelets 208 150 - 400 K/uL   nRBC 0.0 0.0 - 0.2 %    Comment: Performed at [facility], [address] [place], [place] [address]  Troponin I (High Sensitivity)     Status: None   Collection Time: 01/28/22  3:25 PM  Result Value Ref Range   Troponin I (High Sensitivity) 7 <18 ng/L    Comment: (NOTE) Elevated high sensitivity troponin I (hsTnI) values and significant  changes across serial measurements may suggest ACS but many other  chronic and acute conditions are known to elevate hsTnI results.  Refer to the "Links" section for chest pain algorithms and additional  guidance. Performed at [facility], [address] [place], [place] [address]   Protime-INR     Status: Abnormal   Collection Time:  01/28/22  7:09 PM  Result Value Ref Range   Prothrombin Time 28.7 (H) 11.4 - 15.2 seconds   INR 2.7 (H) 0.8 - 1.2    Comment: (NOTE) INR goal varies based on device and disease states. Performed at [facility], [address] [place], [place] [address]   CBG monitoring, ED     Status: Abnormal   Collection Time: 01/29/22  1:22 AM  Result Value Ref Range   Glucose-Capillary 163 (H) 70 - 99 mg/dL    Comment: Glucose reference range applies only to samples taken after fasting for at least 8 hours.  Glucose, capillary     Status: Abnormal   Collection Time: 01/29/22  3:47 AM  Result Value Ref Range   Glucose-Capillary 126 (H) 70 - 99 mg/dL    Comment: Glucose reference range applies only to samples taken after fasting for at least 8 hours.  Type and screen [facility]     Status: None (Preliminary result)   Collection Time: 01/29/22  5:00 AM  Result Value Ref Range   ABO/RH(D) PENDING    Antibody Screen PENDING    Sample Expiration      02/01/2022,2359 Performed at [facility], [address], [place], [place] [address]   Basic metabolic panel     Status: Abnormal   Collection Time: 01/29/22  5:00 AM  Result Value Ref Range   Sodium 132 (L) 135 - 145 mmol/L   Potassium 4.5 3.5 - 5.1 mmol/L   Chloride 99 98 - 111 mmol/L   CO2 24 22 - 32 mmol/L   Glucose, Bld 130 (H) 70 - 99 mg/dL    Comment: Glucose reference range applies only to samples taken after fasting for at least 8 hours.   BUN 13 8 - 23 mg/dL   Creatinine, Ser 9.52 0.44 - 1.00 mg/dL   Calcium 8.6 (L) 8.9 - 10.3 mg/dL   GFR, Estimated >84 >13 mL/min    Comment: (NOTE) Calculated using the CKD-EPI Creatinine Equation (2021)    Anion gap 9 5 - 15    Comment: Performed at [facility], [address] [place], [place] [address]  Brain natriuretic peptide  Status: Abnormal   Collection Time: 01/29/22  5:00 AM  Result Value Ref Range   B  Natriuretic Peptide 733.4 (H) 0.0 - 100.0 pg/mL    Comment: Performed at [facility], [address] [place], [place] [address]  CBC with Differential/Platelet     Status: Abnormal   Collection Time: 01/29/22  5:00 AM  Result Value Ref Range   WBC 25.0 (H) 4.0 - 10.5 K/uL   RBC 5.22 (H) 3.87 - 5.11 MIL/uL   Hemoglobin 15.1 (H) 12.0 - 15.0 g/dL   HCT 23.5 36.1 - 44.3 %   MCV 84.5 80.0 - 100.0 fL   MCH 28.9 26.0 - 34.0 pg   MCHC 34.2 30.0 - 36.0 g/dL   RDW 15.4 (H) 00.8 - 67.6 %   Platelets 257 150 - 400 K/uL   nRBC 0.0 0.0 - 0.2 %   Neutrophils Relative % 83 %   Neutro Abs 20.8 (H) 1.7 - 7.7 K/uL   Lymphocytes Relative 5 %   Lymphs Abs 1.3 0.7 - 4.0 K/uL   Monocytes Relative 11 %   Monocytes Absolute 2.7 (H) 0.1 - 1.0 K/uL   Eosinophils Relative 0 %   Eosinophils Absolute 0.0 0.0 - 0.5 K/uL   Basophils Relative 0 %   Basophils Absolute 0.1 0.0 - 0.1 K/uL   Immature Granulocytes 1 %   Abs Immature Granulocytes 0.19 (H) 0.00 - 0.07 K/uL    Comment: Performed at [facility], [address], [place], [place] [address]  Hepatic function panel     Status: Abnormal   Collection Time: 01/29/22  5:00 AM  Result Value Ref Range   Total Protein 7.7 6.5 - 8.1 g/dL   Albumin 3.4 (L) 3.5 - 5.0 g/dL   AST 16 15 - 41 U/L   ALT 16 0 - 44 U/L   Alkaline Phosphatase 79 38 - 126 U/L   Total Bilirubin 2.1 (H) 0.3 - 1.2 mg/dL   Bilirubin, Direct 0.4 (H) 0.0 - 0.2 mg/dL   Indirect Bilirubin 1.7 (H) 0.3 - 0.9 mg/dL    Comment: Performed at [facility], [address], [place], [place] [address]  Protime-INR     Status: Abnormal   Collection Time: 01/29/22  5:00 AM  Result Value Ref Range   Prothrombin Time 28.5 (H) 11.4 - 15.2 seconds   INR 2.7 (H) 0.8 - 1.2    Comment: (NOTE) INR goal varies based on device and disease states. Performed at [facility], [address] [place], [place] [address]   Troponin I  (High Sensitivity)     Status: None   Collection Time: 01/29/22  5:00 AM  Result Value Ref Range   Troponin I (High Sensitivity) 11 <18 ng/L    Comment: (NOTE) Elevated high sensitivity troponin I (hsTnI) values and significant  changes across serial measurements may suggest ACS but many other  chronic and acute conditions are known to elevate hsTnI results.  Refer to the "Links" section for chest pain algorithms and additional  guidance. Performed at [facility], [address] [place], [place] [address]   Lactic acid, plasma     Status: None   Collection Time: 01/29/22  5:51 AM  Result Value Ref Range   Lactic Acid, Venous 1.9 0.5 - 1.9 mmol/L    Comment: Performed at [facility], [address], [place], [place] [address]    DG CHEST PORT 1 VIEW  Result Date: 01/29/2022 CLINICAL DATA:  81 year old female with shortness of breath. EXAM: PORTABLE CHEST 1 VIEW COMPARISON:  CT Abdomen and Pelvis 01/28/2022 and earlier. FINDINGS: Portable AP semi upright view at 0439 hours. Stable cardiomegaly and mediastinal contours. Levoconvex thoracic scoliosis. Mildly lower lung volumes. Allowing for portable technique the lungs are clear. No pneumothorax or pleural effusion. Visualized tracheal air column is within normal limits. No acute osseous abnormality identified. IMPRESSION: Stable cardiomegaly. No acute cardiopulmonary abnormality. Electronically Signed   By: Odessa Fleming M.D.   On: 01/29/2022 05:17   CT ABDOMEN PELVIS W CONTRAST  Result Date: 01/28/2022 CLINICAL DATA:  Acute nonlocalized abdominal pain. EXAM: CT ABDOMEN AND PELVIS WITH CONTRAST TECHNIQUE: Multidetector CT imaging of the abdomen and pelvis was performed using the standard protocol following bolus administration of intravenous contrast. RADIATION DOSE REDUCTION: This exam was performed according to the departmental dose-optimization program which includes automated exposure control, adjustment  of the mA and/or kV according to patient size and/or use of iterative reconstruction technique. CONTRAST:  105mL OMNIPAQUE IOHEXOL 300 MG/ML  SOLN COMPARISON:  CT 01/20/2021 FINDINGS: Lower chest: Chronic cardiomegaly. Minor atelectasis in the lung bases without focal airspace disease or pleural effusion. Hepatobiliary: The gallbladder is distended. There is pericholecystic fat stranding and gallbladder wall thickening. Small focus of air in the gallbladder fundus likely within the gallbladder lumen. Layering density in the gallbladder may represent stones or sludge. Pneumobilia, likely related to prior ERCP. No abnormal distension of the intra or extrahepatic biliary tree. Diffuse hepatic steatosis with areas of focal fatty sparing adjacent to the gallbladder fossa. Pancreas: Parenchymal atrophy. No ductal dilatation or inflammation. Spleen: Normal in size without focal abnormality. Adrenals/Urinary Tract: No adrenal nodule. Ptotic right kidney. Fluid adjacent to the right kidney may represent renal inflammation or be reactive related to adjacent gallbladder inflammation. There is homogeneous renal enhancement. No hydronephrosis or renal calculi. Simple cyst in the left kidney needs no further follow-up. Unremarkable urinary bladder. Stomach/Bowel: Tiny hiatal hernia. No abnormal gastric distension. Fluid in the distal stomach and the duodenum without wall thickening or inflammation. No small bowel obstruction or inflammation. Normal appendix. Left colonic diverticulosis, no diverticulitis or acute colonic inflammation. Vascular/Lymphatic: Advanced aortic and branch atherosclerosis. No aortic aneurysm. The SMA is densely calcified but patent. 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 Reconstitution of the common femoral artery. IVC filter in place. No bulky adenopathy. Reproductive: Normal for age uterine atrophy.  No adnexal mass. Other: Fat  stranding and small amount of free fluid in the right upper quadrant and right pararenal space. No free air or focal fluid collection. Small fat containing umbilical hernia. Musculoskeletal: There are no acute or suspicious osseous abnormalities. 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 Lower lumbar facet hypertrophy IMPRESSION: 1. Findings highly suspicious for acute cholecystitis. Gallbladder wall thickening or pericholecystic fat stranding and inflammation. Layering density in the gallbladder may represent stones or sludge. Pneumobilia is likely related to prior ERCP, no definite biliary dilatation. 2. Right perinephric fluid is felt to be reactive related to adjacent gallbladder inflammation, there are no other findings of right renal inflammation or obstruction. 3. Hepatic steatosis. 4. Left colonic diverticulosis without diverticulitis. Aortic Atherosclerosis (ICD10-I70.0). Electronically Signed   By: Narda Rutherford M.D.   On: 01/28/2022 18:32   DG Chest 2 View  Result Date: 01/28/2022 CLINICAL DATA:  Pain and vomiting for 1 day. EXAM: CHEST - 2 VIEW COMPARISON:  01/11/2022 and prior radiographs  FINDINGS: Cardiomediastinal silhouette is unchanged with cardiomegaly and tortuous thoracic aorta again noted. There is no evidence of focal airspace disease, pulmonary edema, suspicious pulmonary nodule/mass, pleural effusion, or pneumothorax. No acute bony abnormalities are identified. IVC filter within the abdomen again noted. IMPRESSION: Cardiomegaly without evidence of acute cardiopulmonary disease. Electronically Signed   By: Harmon Pier M.D.   On: 01/28/2022 14:07    I have personally reviewed the relevant CT scan and notes in the electronic medical records  A/P: Acute cholecystitis  She does have significant multiple medical issues.  She will either require an eventual laparoscopic cholecystectomy or a percutaneous cholecystostomy tube.  Currently, her INR is significantly elevated.  She will  need cardiac clearance.  She did refuse surgery last year regarding her gallbladder.  Depending on her overall medical status, she may just need a percutaneous cholecystostomy.  Currently she is on IV antibiotics.  We will continue to follow her laboratory data and make a decision based on her clinical picture and wishes regarding tube placement versus surgery.    Complex medical decision making   Abigail Miyamoto, MD [facility], [facility]

## 2022-01-30 ENCOUNTER — Inpatient Hospital Stay (HOSPITAL_COMMUNITY): Payer: Medicare Other

## 2022-01-30 DIAGNOSIS — K810 Acute cholecystitis: Secondary | ICD-10-CM | POA: Diagnosis not present

## 2022-01-30 HISTORY — PX: IR PERC CHOLECYSTOSTOMY: IMG2326

## 2022-01-30 LAB — COMPREHENSIVE METABOLIC PANEL
ALT: 14 U/L (ref 0–44)
AST: 15 U/L (ref 15–41)
Albumin: 3.3 g/dL — ABNORMAL LOW (ref 3.5–5.0)
Alkaline Phosphatase: 71 U/L (ref 38–126)
Anion gap: 12 (ref 5–15)
BUN: 18 mg/dL (ref 8–23)
CO2: 20 mmol/L — ABNORMAL LOW (ref 22–32)
Calcium: 8.8 mg/dL — ABNORMAL LOW (ref 8.9–10.3)
Chloride: 104 mmol/L (ref 98–111)
Creatinine, Ser: 1.18 mg/dL — ABNORMAL HIGH (ref 0.44–1.00)
GFR, Estimated: 47 mL/min — ABNORMAL LOW (ref >60)
Glucose, Bld: 103 mg/dL — ABNORMAL HIGH (ref 70–99)
Potassium: 4.4 mmol/L (ref 3.5–5.1)
Sodium: 136 mmol/L (ref 135–145)
Total Bilirubin: 2.2 mg/dL — ABNORMAL HIGH (ref 0.3–1.2)
Total Protein: 7.6 g/dL (ref 6.5–8.1)

## 2022-01-30 LAB — CBC WITH DIFFERENTIAL/PLATELET
Abs Immature Granulocytes: 0.23 10*3/uL — ABNORMAL HIGH (ref 0.00–0.07)
Basophils Absolute: 0.1 10*3/uL (ref 0.0–0.1)
Basophils Relative: 0 %
Eosinophils Absolute: 0 10*3/uL (ref 0.0–0.5)
Eosinophils Relative: 0 %
HCT: 43.1 % (ref 36.0–46.0)
Hemoglobin: 13.8 g/dL (ref 12.0–15.0)
Immature Granulocytes: 1 %
Lymphocytes Relative: 6 %
Lymphs Abs: 1.5 10*3/uL (ref 0.7–4.0)
MCH: 27.3 pg (ref 26.0–34.0)
MCHC: 32 g/dL (ref 30.0–36.0)
MCV: 85.2 fL (ref 80.0–100.0)
Monocytes Absolute: 1.7 10*3/uL — ABNORMAL HIGH (ref 0.1–1.0)
Monocytes Relative: 7 %
Neutro Abs: 21.7 10*3/uL — ABNORMAL HIGH (ref 1.7–7.7)
Neutrophils Relative %: 86 %
Platelets: 186 10*3/uL (ref 150–400)
RBC: 5.06 MIL/uL (ref 3.87–5.11)
RDW: 16.3 % — ABNORMAL HIGH (ref 11.5–15.5)
WBC: 25.2 10*3/uL — ABNORMAL HIGH (ref 4.0–10.5)
nRBC: 0 % (ref 0.0–0.2)

## 2022-01-30 LAB — PROTIME-INR
INR: 1.9 — ABNORMAL HIGH (ref 0.8–1.2)
Prothrombin Time: 21.2 seconds — ABNORMAL HIGH (ref 11.4–15.2)

## 2022-01-30 LAB — GLUCOSE, CAPILLARY
Glucose-Capillary: 104 mg/dL — ABNORMAL HIGH (ref 70–99)
Glucose-Capillary: 119 mg/dL — ABNORMAL HIGH (ref 70–99)
Glucose-Capillary: 120 mg/dL — ABNORMAL HIGH (ref 70–99)
Glucose-Capillary: 88 mg/dL (ref 70–99)
Glucose-Capillary: 90 mg/dL (ref 70–99)
Glucose-Capillary: 97 mg/dL (ref 70–99)

## 2022-01-30 MED ORDER — SODIUM CHLORIDE 0.9 % IV BOLUS
500.0000 mL | Freq: Once | INTRAVENOUS | Status: AC
  Administered 2022-01-30: 500 mL via INTRAVENOUS

## 2022-01-30 MED ORDER — LIDOCAINE HCL 1 % IJ SOLN
INTRAMUSCULAR | Status: AC
  Filled 2022-01-30: qty 20

## 2022-01-30 MED ORDER — LIDOCAINE-EPINEPHRINE 1 %-1:100000 IJ SOLN
INTRAMUSCULAR | Status: AC | PRN
  Administered 2022-01-30: 10 mL via INTRADERMAL

## 2022-01-30 MED ORDER — FENTANYL CITRATE (PF) 100 MCG/2ML IJ SOLN
INTRAMUSCULAR | Status: AC | PRN
  Administered 2022-01-30: 25 ug via INTRAVENOUS

## 2022-01-30 MED ORDER — MIDAZOLAM HCL 2 MG/2ML IJ SOLN
INTRAMUSCULAR | Status: AC
  Filled 2022-01-30: qty 2

## 2022-01-30 MED ORDER — IOHEXOL 300 MG/ML  SOLN
50.0000 mL | Freq: Once | INTRAMUSCULAR | Status: AC | PRN
  Administered 2022-01-30: 5 mL

## 2022-01-30 MED ORDER — SODIUM CHLORIDE 0.9% FLUSH
5.0000 mL | Freq: Three times a day (TID) | INTRAVENOUS | Status: DC
Start: 2022-01-30 — End: 2022-02-02
  Administered 2022-01-30 – 2022-02-02 (×9): 5 mL

## 2022-01-30 MED ORDER — HEPARIN (PORCINE) 25000 UT/250ML-% IV SOLN
1150.0000 [IU]/h | INTRAVENOUS | Status: DC
  Administered 2022-01-30: 1000 [IU]/h via INTRAVENOUS
  Administered 2022-01-31: 1150 [IU]/h via INTRAVENOUS
  Filled 2022-01-30 (×2): qty 250

## 2022-01-30 MED ORDER — DILTIAZEM HCL-DEXTROSE 125-5 MG/125ML-% IV SOLN (PREMIX)
5.0000 mg/h | INTRAVENOUS | Status: DC
  Administered 2022-01-30 – 2022-01-31 (×2): 5 mg/h via INTRAVENOUS
  Filled 2022-01-30 (×2): qty 125

## 2022-01-30 MED ORDER — MIDAZOLAM HCL 2 MG/2ML IJ SOLN
INTRAMUSCULAR | Status: AC | PRN
  Administered 2022-01-30: .5 mg via INTRAVENOUS

## 2022-01-30 MED ORDER — FENTANYL CITRATE (PF) 100 MCG/2ML IJ SOLN
INTRAMUSCULAR | Status: AC
  Filled 2022-01-30: qty 2

## 2022-01-30 NOTE — Progress Notes (Signed)
PROGRESS NOTE  Jill Crane  DOB: 1941/01/31  PCP: Burman Freestone, MD CBS:[id number]  DOA: 01/28/2022  LOS: 1 day  Hospital Day: 3  Brief narrative: Jill Crane is a 81 y.o. female with PMH significant for DM2, HTN, A-fib, CHF, COPD and a history of ERCP for choledocholithiasis. Patient presented to the ED on 7/15 with complaint of right upper quadrant abdominal pain for 2 days associated with multiple episode of nausea, vomiting. Initial afebrile in the ED, hemodynamically stable.  Breathing on room air Initial labs showed sodium low at 129, liver enzymes including alk phos normal but bilirubin slightly elevated at 1.6, WC count elevated to 19.7 CT abdomen and pelvis showed gallbladder wall thickening, pericholecystic fat stranding and inflammation, gallstones, suggestive of acute cholecystitis. ED physician discussed the case with general surgery Dr. Ninfa Linden Admitted to hospitalist service for further evaluation management  Subjective: Patient was seen and examined this morning.   Lying on bed.  Mild abdominal pain.  This morning her hemodynamics have been unstable.  She is in RVR up to 130s, blood pressure dipped down as low as 80s.  She had a fever of 101.7. Started on Cardizem drip Seen by IR this afternoon.  Plan for cholecystostomy tube placement. Daughter was at bedside.    Assessment and plan: Sepsis secondary to acute cholecystitis -Presented with right upper quadrant pain.  Imaging suggestive of acute cholecystitis -Met sepsis criteria with fever, leukocytosis, tachycardia -Seen by general surgery and IR.  IR plans for percutaneous cholecystectomy tube placement this afternoon. -Currently on IV Zosyn Recent Labs  Lab 01/28/22 1525 01/29/22 0500 01/29/22 0551 01/30/22 0313  WBC 19.7* 25.0*  --  25.2*  LATICACIDVEN  --   --  1.9  --     Atrial flutter with RVR  -History of A-fib/flutter on Coumadin at home  -Overnight, patient was in a flutter up to 130.   Cardizem drip started.  Heart rate improving, blood pressure stable so far.  Needed a bolus this morning. -PTA, patient was on Coumadin.  Currently on hold.  INR subtherapeutic this morning. -After the procedure, IR to comment if patient can be started on heparin drip.  Ultimately to resume Coumadin prior to discharge. Recent Labs  Lab 01/28/22 1909 01/29/22 0500 01/30/22 0750  INR 2.7* 2.7* 1.9*    Type 2 diabetes mellitus Diabetic neuropathy -A1c 7 on 01/21/2021 -Currently metformin on hold.  Continue sliding scale insulin coverage. -Continue Neurontin 300 mg at bedtime Recent Labs  Lab 01/29/22 1957 01/29/22 2355 01/30/22 0312 01/30/22 0803 01/30/22 1224  GLUCAP 123* 98 104* 88 120*   Chronic diastolic CHF  Essential hypertension  -Per care everywhere, echo done at [place] on May 2023 showed EF normal at 60 to 65% with mild LVH. -PTA on metoprolol 12.5 mg twice daily, Lasix 20 mg daily -Heart failure currently is compensated at this time.  Metoprolol hold while on Cardizem drip.  Lasix on hold.  IV fluid given this morning.  History of pemphigus  -on chronic steroids with prednisone 7.5 mg daily  Peripheral vascular disease  -Per daughter, patient is no longer on aspirin or Pletal.  Anxiety/depression -Continue buspirone 10 mg twice daily  Goals of care   Code Status: Full Code   Mobility: PT eval.  Skin assessment: None    Nutritional status:  Body mass index is 26.38 kg/m.          Diet:  Diet Order  Diet NPO time specified  Diet effective now                   DVT prophylaxis:  SCDs Start: 01/29/22 0327   Antimicrobials: IV Zosyn Fluid: None Consultants: Surgery Family Communication: Daughter at bedside  Status is: Inpatient  Continue in-hospital care because: Pending percutaneous cholecystostomy tube placement today Level of care: Stepdown   Dispo: The patient is from: Home              Anticipated d/c is to: Pending  clinical course              Patient currently is not medically stable to d/c.   Difficult to place patient No     Infusions:   diltiazem (CARDIZEM) infusion 5 mg/hr (01/30/22 1058)   piperacillin-tazobactam (ZOSYN)  IV 3.375 g (01/30/22 1451)    Scheduled Meds:  acetaminophen  650 mg Rectal Once   brimonidine  1 drop Left Eye Q8H   busPIRone  10 mg Oral BID   Chlorhexidine Gluconate Cloth  6 each Topical Daily   dorzolamide  1 drop Both Eyes QHS   gabapentin  300 mg Oral QHS   insulin aspart  0-6 Units Subcutaneous Q4H   mupirocin ointment  1 Application Nasal BID   predniSONE  7.5 mg Oral Q breakfast    PRN meds: albuterol, fluocinonide ointment, fluticasone, morphine injection, mouth rinse   Antimicrobials: Anti-infectives (From admission, onward)    Start     Dose/Rate Route Frequency Ordered Stop   01/29/22 0445  piperacillin-tazobactam (ZOSYN) IVPB 3.375 g        3.375 g 12.5 mL/hr over 240 Minutes Intravenous Every 8 hours 01/29/22 0354     01/28/22 1900  piperacillin-tazobactam (ZOSYN) IVPB 3.375 g        3.375 g 100 mL/hr over 30 Minutes Intravenous  Once 01/28/22 1846 01/28/22 1935       Objective: Vitals:   01/30/22 1100 01/30/22 1300  BP: (!) 119/55   Pulse:    Resp: (!) 21   Temp:  99.7 F (37.6 C)  SpO2:      Intake/Output Summary (Last 24 hours) at 01/30/2022 1554 Last data filed at 01/30/2022 0500 Gross per 24 hour  Intake 129.35 ml  Output 200 ml  Net -70.65 ml   Filed Weights   01/30/22 1000  Weight: 71.9 kg   Weight change:  Body mass index is 26.38 kg/m.   Physical Exam: General exam: Pleasant, elderly African-American female Skin: No rashes, lesions or ulcers. HEENT: Atraumatic, normocephalic, no obvious bleeding Lungs: Clear to auscultation bilaterally CVS: Tachycardic, irregular rhythm, no murmur GI/Abd mild right upper quadrant tenderness CNS: Opens eyes to verbal command, able to communicate Psychiatry: Mood  appropriate Extremities: No pedal edema, no calf tenderness  Data Review: I have personally reviewed the laboratory data and studies available.  F/u labs ordered Unresulted Labs (From admission, onward)     Start     Ordered   Unscheduled  CBC with Differential/Platelet  Tomorrow morning,   R       Question:  Specimen collection method  Answer:  Lab=Lab collect   01/30/22 1554   Unscheduled  Basic metabolic panel  Tomorrow morning,   R       Question:  Specimen collection method  Answer:  Lab=Lab collect   01/30/22 1554            Signed, Terrilee Croak, MD [facility] 01/30/2022

## 2022-01-30 NOTE — Progress Notes (Signed)
PT Cancellation Note  Patient Details Name: Jill Crane MRN: [id number] DOB: Nov 20, 1940   Cancelled Treatment:    Reason Eval/Treat Not Completed: Medical issues which prohibited therapy.  Blanchard Kelch PT Acute Rehabilitation Services Office [phone] Weekend [phone]   Rada Hay 01/30/2022, 12:52 PM

## 2022-01-30 NOTE — Progress Notes (Signed)
Progress Note     Subjective: Pt reports some abdominal pain this AM. Some nausea. Previously refused gallbladder surgery but a little unclear this AM whether she would be willing to consider.   Objective: Vital signs in last 24 hours: Temp:  [98.3 F (36.8 C)-100.5 F (38.1 C)] 99.3 F (37.4 C) (07/17 0329) Pulse Rate:  [33-121] 120 (07/17 0600) Resp:  [17-43] 34 (07/17 0800) BP: (80-162)/(48-107) 110/68 (07/17 0800) SpO2:  [76 %-100 %] 100 % (07/17 0400) Last BM Date : 01/28/22  Intake/Output from previous day: 07/16 0701 - 07/17 0700 In: 129.4 [IV Piggyback:129.4] Out: 600 [Urine:600] Intake/Output this shift: No intake/output data recorded.  PE: General: pleasant, WD, elderly female who is laying in bed in NAD HEENT:Sclera are anicteric Heart: irregular in the 120s - 130s Lungs: CTAB, no wheezes, rhonchi, or rales noted. Respiratory effort nonlabored Abd: soft, ttp in RUQ and epigastric abdomen, ND, +BS, no masses, hernias, or organomegaly   Lab Results:  Recent Labs    01/29/22 0500 01/30/22 0313  WBC 25.0* 25.2*  HGB 15.1* 13.8  HCT 44.1 43.1  PLT 257 186   BMET Recent Labs    01/29/22 0500 01/30/22 0313  NA 132* 136  K 4.5 4.4  CL 99 104  CO2 24 20*  GLUCOSE 130* 103*  BUN 13 18  CREATININE 0.88 1.18*  CALCIUM 8.6* 8.8*   PT/INR Recent Labs    01/28/22 1909 01/29/22 0500  LABPROT 28.7* 28.5*  INR 2.7* 2.7*   CMP     Component Value Date/Time   NA 136 01/30/2022 0313   K 4.4 01/30/2022 0313   CL 104 01/30/2022 0313   CO2 20 (L) 01/30/2022 0313   GLUCOSE 103 (H) 01/30/2022 0313   BUN 18 01/30/2022 0313   CREATININE 1.18 (H) 01/30/2022 0313   CALCIUM 8.8 (L) 01/30/2022 0313   PROT 7.6 01/30/2022 0313   ALBUMIN 3.3 (L) 01/30/2022 0313   AST 15 01/30/2022 0313   ALT 14 01/30/2022 0313   ALKPHOS 71 01/30/2022 0313   BILITOT 2.2 (H) 01/30/2022 0313   GFRNONAA 47 (L) 01/30/2022 0313   GFRAA >60 11/11/2019 0518   Lipase      Component Value Date/Time   LIPASE 19 01/28/2022 1525       Studies/Results: DG CHEST PORT 1 VIEW  Result Date: 01/29/2022 CLINICAL DATA:  81 year old female with shortness of breath. EXAM: PORTABLE CHEST 1 VIEW COMPARISON:  CT Abdomen and Pelvis 01/28/2022 and earlier. FINDINGS: Portable AP semi upright view at 0439 hours. Stable cardiomegaly and mediastinal contours. Levoconvex thoracic scoliosis. Mildly lower lung volumes. Allowing for portable technique the lungs are clear. No pneumothorax or pleural effusion. Visualized tracheal air column is within normal limits. No acute osseous abnormality identified. IMPRESSION: Stable cardiomegaly. No acute cardiopulmonary abnormality. Electronically Signed   By: Odessa Fleming M.D.   On: 01/29/2022 05:17   CT ABDOMEN PELVIS W CONTRAST  Result Date: 01/28/2022 CLINICAL DATA:  Acute nonlocalized abdominal pain. EXAM: CT ABDOMEN AND PELVIS WITH CONTRAST TECHNIQUE: Multidetector CT imaging of the abdomen and pelvis was performed using the standard protocol following bolus administration of intravenous contrast. RADIATION DOSE REDUCTION: This exam was performed according to the departmental dose-optimization program which includes automated exposure control, adjustment of the mA and/or kV according to patient size and/or use of iterative reconstruction technique. CONTRAST:  62mL OMNIPAQUE IOHEXOL 300 MG/ML  SOLN COMPARISON:  CT 01/20/2021 FINDINGS: Lower chest: Chronic cardiomegaly. Minor atelectasis in the lung bases without  focal airspace disease or pleural effusion. Hepatobiliary: The gallbladder is distended. There is pericholecystic fat stranding and gallbladder wall thickening. Small focus of air in the gallbladder fundus likely within the gallbladder lumen. Layering density in the gallbladder may represent stones or sludge. Pneumobilia, likely related to prior ERCP. No abnormal distension of the intra or extrahepatic biliary tree. Diffuse hepatic steatosis with  areas of focal fatty sparing adjacent to the gallbladder fossa. Pancreas: Parenchymal atrophy. No ductal dilatation or inflammation. Spleen: Normal in size without focal abnormality. Adrenals/Urinary Tract: No adrenal nodule. Ptotic right kidney. Fluid adjacent to the right kidney may represent renal inflammation or be reactive related to adjacent gallbladder inflammation. There is homogeneous renal enhancement. No hydronephrosis or renal calculi. Simple cyst in the left kidney needs no further follow-up. Unremarkable urinary bladder. Stomach/Bowel: Tiny hiatal hernia. No abnormal gastric distension. Fluid in the distal stomach and the duodenum without wall thickening or inflammation. No small bowel obstruction or inflammation. Normal appendix. Left colonic diverticulosis, no diverticulitis or acute colonic inflammation. Vascular/Lymphatic: Advanced aortic and branch atherosclerosis. No aortic aneurysm. The SMA is densely calcified but patent. 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 Reconstitution of the common femoral artery. IVC filter in place. No bulky adenopathy. Reproductive: Normal for age uterine atrophy.  No adnexal mass. Other: Fat stranding and small amount of free fluid in the right upper quadrant and right pararenal space. No free air or focal fluid collection. Small fat containing umbilical hernia. Musculoskeletal: There are no acute or suspicious osseous abnormalities. 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 Lower lumbar facet hypertrophy IMPRESSION: 1. Findings highly suspicious for acute cholecystitis. Gallbladder wall thickening or pericholecystic fat stranding and inflammation. Layering density in the gallbladder may represent stones or sludge. Pneumobilia is likely related to prior ERCP, no definite biliary dilatation. 2. Right perinephric fluid is felt to be reactive related to adjacent gallbladder inflammation, there  are no other findings of right renal inflammation or obstruction. 3. Hepatic steatosis. 4. Left colonic diverticulosis without diverticulitis. Aortic Atherosclerosis (ICD10-I70.0). Electronically Signed   By: Narda Rutherford M.D.   On: 01/28/2022 18:32   DG Chest 2 View  Result Date: 01/28/2022 CLINICAL DATA:  Pain and vomiting for 1 day. EXAM: CHEST - 2 VIEW COMPARISON:  01/11/2022 and prior radiographs FINDINGS: Cardiomediastinal silhouette is unchanged with cardiomegaly and tortuous thoracic aorta again noted. There is no evidence of focal airspace disease, pulmonary edema, suspicious pulmonary nodule/mass, pleural effusion, or pneumothorax. No acute bony abnormalities are identified. IVC filter within the abdomen again noted. IMPRESSION: Cardiomegaly without evidence of acute cardiopulmonary disease. Electronically Signed   By: Harmon Pier M.D.   On: 01/28/2022 14:07    Anti-infectives: Anti-infectives (From admission, onward)    Start     Dose/Rate Route Frequency Ordered Stop   01/29/22 0445  piperacillin-tazobactam (ZOSYN) IVPB 3.375 g        3.375 g 12.5 mL/hr over 240 Minutes Intravenous Every 8 hours 01/29/22 0354     01/28/22 1900  piperacillin-tazobactam (ZOSYN) IVPB 3.375 g        3.375 g 100 mL/hr over 30 Minutes Intravenous  Once 01/28/22 1846 01/28/22 1935        Assessment/Plan Acute cholecystitis - CT 7/16 with gallbladder wall thickening and pericholecystic inflammation  - pt ttp in RUQ on exam - WBC 25.2 and febrile to 101.7 - INR 1.9 this AM  - Will discuss with MD if patient would be  considered for laparoscopic cholecystectomy vs just IR consult for percutaneous cholecystostomy given tachycardia and anticoagulation  FEN: NPO, IVF VTE: holding coumadin  ID: Zosyn 7/16>>  - below per TRH -  A. Fib on coumadin Chronic diastolic CHF T2DM COPD HTN Hx of DVT  LOS: 1 day   I reviewed hospitalist notes, last 24 h vitals and pain scores, last 48 h intake and  output, last 24 h labs and trends, and last 24 h imaging results.      Juliet Rude, [facility] 01/30/2022, 8:45 AM Please see Amion for pager number during day hours 7:00am-4:30pm

## 2022-01-30 NOTE — Progress Notes (Addendum)
ANTICOAGULATION CONSULT NOTE - Initial Consult  Pharmacy Consult for heparin Indication: atrial fibrillation  No Known Allergies  Patient Measurements: Height: 5\' 5"  (165.1 cm) Weight: 71.9 kg (158 lb 8.2 oz) IBW/kg (Calculated) : 57 Heparin Dosing Weight: 71 kg  Vital Signs: Temp: 97.7 F (36.5 C) (07/17 1710) Temp Source: Oral (07/17 1710) BP: 119/91 (07/17 1710) Pulse Rate: 102 (07/17 1710)  Labs: Recent Labs    01/28/22 1525 01/28/22 1909 01/29/22 0500 01/30/22 0313 01/30/22 0750  HGB 12.9  --  15.1* 13.8  --   HCT 39.2  --  44.1 43.1  --   PLT 208  --  257 186  --   LABPROT  --  28.7* 28.5*  --  21.2*  INR  --  2.7* 2.7*  --  1.9*  CREATININE 0.70  --  0.88 1.18*  --   TROPONINIHS 7  --  11  --   --     Estimated Creatinine Clearance: 37.8 mL/min (A) (by C-G formula based on SCr of 1.18 mg/dL (H)).   Medical History: Past Medical History:  Diagnosis Date   Atrial fibrillation with normal ventricular rate (HCC)    CHF (congestive heart failure) (HCC)    COPD (chronic obstructive pulmonary disease) (HCC)    Diabetes mellitus without complication (HCC)    Hx of blood clots    Hypertension       Assessment: 81 year old female with history of afib on warfarin PTA which has been held for procedure. Presented with right upper quadrant pain; CT abdomen/pelvis suggestive of acute cholecystitis. Patient is s/p IR placement of percutaneous cholecystectomy tube. Pharmacy consulted to start heparin.   Hgb stable, plt trending down but WNL.  Goal of Therapy:  Heparin level 0.3-0.7 units/ml Monitor platelets by anticoagulation protocol: Yes   Plan:  -Per consult, start heparin at 2100 -Given procedure, will defer initial bolus -Start heparin infusion at 1000 units/hr -Check heparin level ~ 8 hr after start of infusion -Plan discussed with RN ~ 2100, no frank bleeding from drain - purulent drainage continues -Monitor CBC, signs of bleeding -Follow up  appropriate restart of warfarin  [id number], PharmD, BCPS Clinical Pharmacist 01/30/2022 6:41 PM

## 2022-01-30 NOTE — Progress Notes (Incomplete)
ANTICOAGULATION CONSULT NOTE - Initial Consult  Pharmacy Consult for Heparin Indication: atrial fibrillation  No Known Allergies  Patient Measurements: Height: 5\' 5"  (165.1 cm) Weight: 71.9 kg (158 lb 8.2 oz) IBW/kg (Calculated) : 57 Heparin Dosing Weight: 71.9 kg  Vital Signs: Temp: 101.7 F (38.7 C) (07/17 0800) Temp Source: Oral (07/17 0800) BP: 99/55 (07/17 0900) Pulse Rate: 121 (07/17 0900)  Labs: Recent Labs    01/28/22 1525 01/28/22 1909 01/29/22 0500 01/30/22 0313 01/30/22 0750  HGB 12.9  --  15.1* 13.8  --   HCT 39.2  --  44.1 43.1  --   PLT 208  --  257 186  --   LABPROT  --  28.7* 28.5*  --  21.2*  INR  --  2.7* 2.7*  --  1.9*  CREATININE 0.70  --  0.88 1.18*  --   TROPONINIHS 7  --  11  --   --     Estimated Creatinine Clearance: 37.8 mL/min (A) (by C-G formula based on SCr of 1.18 mg/dL (H)).   Medical History: Past Medical History:  Diagnosis Date   Atrial fibrillation with normal ventricular rate (HCC)    CHF (congestive heart failure) (HCC)    COPD (chronic obstructive pulmonary disease) (HCC)    Diabetes mellitus without complication (HCC)    Hx of blood clots    Hypertension    Assessment: Active Problem(s): right upper quadrant abdominal pain for 2 days associated with multiple episode of nausea, vomiting. CT with acute cholecystitis  AC/Heme: warf PTA for Afib on hold, SCDs, Hgb WNL INR 2.7>1.9  Goal of Therapy:  Heparin level 0.3-0.7 units/ml Monitor platelets by anticoagulation protocol: Yes   Plan:  Start IV heparin (no bolus with INR 1.9) at 1000 units/hr Check heparin level in 8 hrs Daily HL and CBC  Quency Tober S. 02/01/22, PharmD, BCPS Clinical Staff Pharmacist [email or web address] Merilynn Finland, Merilynn Finland 01/30/2022,10:17 AM

## 2022-01-30 NOTE — Procedures (Addendum)
Interventional Radiology Procedure Note  Procedure: Image guided percutaneous cholecystostomy tube placement  Findings: Please refer to procedural dictation for full description. 10.2 Fr cholecystostomy placed via subhepatic/transperitoneal approach.  Sample of purulent bile sent for culture.  Placed drain to bag drainage.  Complications: None immediate  Estimated Blood Loss: < 5 mL  Recommendations: Keep to bag drainage. IR will follow. Follow up culture results. If no blood in drain for 3 hours post-procedure, may resume anticoagulation.  If the patient is deemed a poor surgical candidate indefinitely, she would likely be a candidate for choledochoscopic-assisted percutaneous gallstone retrieval via established access and eventual tube removal.    Marliss Coots, MD Pager: [phone]

## 2022-01-30 NOTE — Progress Notes (Signed)
Chief Complaint: Patient was seen in consultation today for  per chole drain   Referring Physician(s): Dr. Carman Ching  Supervising Physician: Marliss Coots  Patient Status: [name] - In-pt  History of Present Illness: Jill Crane is a 81 y.o. female admitted with fever, abd pain, and leukocytosis. Her workup finds evidence of cholecystitis. She had previously had some gallbladder issues last year including CBD stones requiring ERCP, stone removal. However, she ultimately refused to have anything further done to her gallbladder. She is now more ill than last episode with high WBC and fever. Surgery has been consulted and feel she is not an immediate surgical candidate. She is also on Coumadin for chronic a.fib. This has been held and INR has come down from 2.7 to 1.9. IR is asked to eval for placement of perc chole drain.  Past Medical History:  Diagnosis Date   Atrial fibrillation with normal ventricular rate (HCC)    CHF (congestive heart failure) (HCC)    COPD (chronic obstructive pulmonary disease) (HCC)    Diabetes mellitus without complication (HCC)    Hx of blood clots    Hypertension     Past Surgical History:  Procedure Laterality Date   ENDOSCOPIC RETROGRADE CHOLANGIOPANCREATOGRAPHY (ERCP) WITH PROPOFOL N/A 01/22/2021   Procedure: ENDOSCOPIC RETROGRADE CHOLANGIOPANCREATOGRAPHY (ERCP) WITH PROPOFOL;  Surgeon: Hilarie Fredrickson, MD;  Location: [facility] ENDOSCOPY;  Service: Endoscopy;  Laterality: N/A;   ESOPHAGOGASTRODUODENOSCOPY (EGD) WITH PROPOFOL N/A 01/28/2021   Procedure: ESOPHAGOGASTRODUODENOSCOPY (EGD) WITH PROPOFOL;  Surgeon: Iva Boop, MD;  Location: [facility] ENDOSCOPY;  Service: Endoscopy;  Laterality: N/A;   REMOVAL OF STONES  01/22/2021   Procedure: REMOVAL OF STONES;  Surgeon: Hilarie Fredrickson, MD;  Location: [facility] ENDOSCOPY;  Service: Endoscopy;;   SPHINCTEROTOMY  01/22/2021   Procedure: Dennison Mascot;  Surgeon: Hilarie Fredrickson, MD;  Location: [facility] ENDOSCOPY;  Service:  Endoscopy;;    Allergies: Patient has no known allergies.  Medications:  Current Facility-Administered Medications:    acetaminophen (TYLENOL) suppository 650 mg, 650 mg, Rectal, Once, Jill Clos, MD   albuterol (PROVENTIL) (2.5 MG/3ML) 0.083% nebulizer solution 3 mL, 3 mL, Inhalation, Q6H PRN, Dahal, Binaya, MD   brimonidine (ALPHAGAN) 0.2 % ophthalmic solution 1 drop, 1 drop, Left Eye, Q8H, Jill Clos, MD, 1 drop at 01/30/22 0509   busPIRone (BUSPAR) tablet 10 mg, 10 mg, Oral, BID, Dahal, Binaya, MD, 10 mg at 01/30/22 9678   Chlorhexidine Gluconate Cloth 2 % PADS 6 each, 6 each, Topical, Daily, Dahal, Binaya, MD, 6 each at 01/30/22 0824   diltiazem (CARDIZEM) 125 mg in dextrose 5% 125 mL (1 mg/mL) infusion, 5-15 mg/hr, Intravenous, Titrated, Dahal, Binaya, MD, Last Rate: 5 mL/hr at 01/30/22 1058, 5 mg/hr at 01/30/22 1058   dorzolamide (TRUSOPT) 2 % ophthalmic solution 1 drop, 1 drop, Both Eyes, QHS, Midge Minium N, MD, 1 drop at 01/30/22 0000   fluocinonide ointment (LIDEX) 0.05 % 1 Application, 1 Application, Topical, BID PRN, Dahal, Binaya, MD   fluticasone (FLONASE) 50 MCG/ACT nasal spray 2 spray, 2 spray, Each Nare, Daily PRN, Dahal, Binaya, MD   gabapentin (NEURONTIN) capsule 300 mg, 300 mg, Oral, QHS, Dahal, Binaya, MD, 300 mg at 01/29/22 2154   insulin aspart (novoLOG) injection 0-6 Units, 0-6 Units, Subcutaneous, Q4H, Jill Clos, MD   metoprolol tartrate (LOPRESSOR) tablet 12.5 mg, 12.5 mg, Oral, BID, Dahal, Binaya, MD, 12.5 mg at 01/30/22 0823   morphine (PF) 2 MG/ML injection 0.5 mg, 0.5 mg, Intravenous, Q2H PRN, Toniann Fail,  Meryle ReadyArshad N, MD, 0.5 mg at 01/30/22 1140   mupirocin ointment (BACTROBAN) 2 % 1 Application, 1 Application, Nasal, BID, Dahal, Binaya, MD, 1 Application at 01/30/22 0824   Oral care mouth rinse, 15 mL, Mouth Rinse, PRN, Dahal, Binaya, MD   piperacillin-tazobactam (ZOSYN) IVPB 3.375 g, 3.375 g, Intravenous, Q8H, Jill ClosKakrakandy,  Arshad N, MD, Stopped at 01/30/22 [id number]   predniSONE (DELTASONE) tablet 7.5 mg, 7.5 mg, Oral, Q breakfast, Dahal, Binaya, MD, 7.5 mg at 01/30/22 [id number]    Family History  Problem Relation Age of Onset   Stroke Mother    Diabetes Mother    Stroke Father     Social History   Socioeconomic History   Marital status: Widowed    Spouse name: Not on file   Number of children: Not on file   Years of education: Not on file   Highest education level: Not on file  Occupational History   Not on file  Tobacco Use   Smoking status: Former    Types: Cigarettes   Smokeless tobacco: Never  Vaping Use   Vaping Use: Never used  Substance and Sexual Activity   Alcohol use: No   Drug use: Never   Sexual activity: Not on file  Other Topics Concern   Not on file  Social History Narrative   Not on file   Social Determinants of Health   Financial Resource Strain: Not on file  Food Insecurity: Not on file  Transportation Needs: Not on file  Physical Activity: Not on file  Stress: Not on file  Social Connections: Not on file    Review of Systems: A 12 point ROS discussed and pertinent positives are indicated in the HPI above.  All other systems are negative.  Review of Systems  Vital Signs: BP (!) 119/55   Pulse (!) 121   Temp 99.7 F (37.6 C) (Oral)   Resp (!) 21   Ht 5\' 5"  (1.651 m)   Wt 71.9 kg   SpO2 99%   BMI 26.38 kg/m   Physical Exam Constitutional:      General: She is not in acute distress.    Appearance: She is not toxic-appearing.  Cardiovascular:     Rate and Rhythm: Rhythm irregular.  Pulmonary:     Effort: Pulmonary effort is normal. No respiratory distress.     Breath sounds: Normal breath sounds.  Abdominal:     Palpations: Abdomen is soft.     Comments: Mildly tender RUQ  Skin:    General: Skin is warm and dry.     Coloration: Skin is not jaundiced.  Neurological:     Mental Status: She is disoriented.     Imaging: DG CHEST PORT 1 VIEW  Result  Date: 01/29/2022 CLINICAL DATA:  81 year old female with shortness of breath. EXAM: PORTABLE CHEST 1 VIEW COMPARISON:  CT Abdomen and Pelvis 01/28/2022 and earlier. FINDINGS: Portable AP semi upright view at 0439 hours. Stable cardiomegaly and mediastinal contours. Levoconvex thoracic scoliosis. Mildly lower lung volumes. Allowing for portable technique the lungs are clear. No pneumothorax or pleural effusion. Visualized tracheal air column is within normal limits. No acute osseous abnormality identified. IMPRESSION: Stable cardiomegaly. No acute cardiopulmonary abnormality. Electronically Signed   By: Odessa FlemingH  Hall M.D.   On: 01/29/2022 05:17   CT ABDOMEN PELVIS W CONTRAST  Result Date: 01/28/2022 CLINICAL DATA:  Acute nonlocalized abdominal pain. EXAM: CT ABDOMEN AND PELVIS WITH CONTRAST TECHNIQUE: Multidetector CT imaging of the abdomen and pelvis was performed using the  standard protocol following bolus administration of intravenous contrast. RADIATION DOSE REDUCTION: This exam was performed according to the departmental dose-optimization program which includes automated exposure control, adjustment of the mA and/or kV according to patient size and/or use of iterative reconstruction technique. CONTRAST:  68mL OMNIPAQUE IOHEXOL 300 MG/ML  SOLN COMPARISON:  CT 01/20/2021 FINDINGS: Lower chest: Chronic cardiomegaly. Minor atelectasis in the lung bases without focal airspace disease or pleural effusion. Hepatobiliary: The gallbladder is distended. There is pericholecystic fat stranding and gallbladder wall thickening. Small focus of air in the gallbladder fundus likely within the gallbladder lumen. Layering density in the gallbladder may represent stones or sludge. Pneumobilia, likely related to prior ERCP. No abnormal distension of the intra or extrahepatic biliary tree. Diffuse hepatic steatosis with areas of focal fatty sparing adjacent to the gallbladder fossa. Pancreas: Parenchymal atrophy. No ductal dilatation  or inflammation. Spleen: Normal in size without focal abnormality. Adrenals/Urinary Tract: No adrenal nodule. Ptotic right kidney. Fluid adjacent to the right kidney may represent renal inflammation or be reactive related to adjacent gallbladder inflammation. There is homogeneous renal enhancement. No hydronephrosis or renal calculi. Simple cyst in the left kidney needs no further follow-up. Unremarkable urinary bladder. Stomach/Bowel: Tiny hiatal hernia. No abnormal gastric distension. Fluid in the distal stomach and the duodenum without wall thickening or inflammation. No small bowel obstruction or inflammation. Normal appendix. Left colonic diverticulosis, no diverticulitis or acute colonic inflammation. Vascular/Lymphatic: Advanced aortic and branch atherosclerosis. No aortic aneurysm. The SMA is densely calcified but patent. 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 Reconstitution of the common femoral artery. IVC filter in place. No bulky adenopathy. Reproductive: Normal for age uterine atrophy.  No adnexal mass. Other: Fat stranding and small amount of free fluid in the right upper quadrant and right pararenal space. No free air or focal fluid collection. Small fat containing umbilical hernia. Musculoskeletal: There are no acute or suspicious osseous abnormalities. 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 Lower lumbar facet hypertrophy IMPRESSION: 1. Findings highly suspicious for acute cholecystitis. Gallbladder wall thickening or pericholecystic fat stranding and inflammation. Layering density in the gallbladder may represent stones or sludge. Pneumobilia is likely related to prior ERCP, no definite biliary dilatation. 2. Right perinephric fluid is felt to be reactive related to adjacent gallbladder inflammation, there are no other findings of right renal inflammation or obstruction. 3. Hepatic steatosis. 4. Left colonic  diverticulosis without diverticulitis. Aortic Atherosclerosis (ICD10-I70.0). Electronically Signed   By: Narda Rutherford M.D.   On: 01/28/2022 18:32   DG Chest 2 View  Result Date: 01/28/2022 CLINICAL DATA:  Pain and vomiting for 1 day. EXAM: CHEST - 2 VIEW COMPARISON:  01/11/2022 and prior radiographs FINDINGS: Cardiomediastinal silhouette is unchanged with cardiomegaly and tortuous thoracic aorta again noted. There is no evidence of focal airspace disease, pulmonary edema, suspicious pulmonary nodule/mass, pleural effusion, or pneumothorax. No acute bony abnormalities are identified. IVC filter within the abdomen again noted. IMPRESSION: Cardiomegaly without evidence of acute cardiopulmonary disease. Electronically Signed   By: Harmon Pier M.D.   On: 01/28/2022 14:07   MR BRAIN WO CONTRAST  Result Date: 01/12/2022 CLINICAL DATA:  Initial evaluation for acute dizziness. EXAM: MRI HEAD WITHOUT CONTRAST TECHNIQUE: Multiplanar, multiecho pulse sequences of the brain and surrounding structures were obtained without intravenous contrast. COMPARISON:  Prior CT from 01/11/2022. FINDINGS: Brain: Generalized age-related cerebral atrophy. Patchy and confluent T2/FLAIR hyperintensity involving the periventricular and deep white matter both cerebral hemispheres as  well as the pons, consistent with chronic small vessel ischemic disease, moderate in nature. 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 Few small remote cerebellar infarcts noted, right greater than left. No abnormal foci of restricted diffusion to suggest acute or subacute ischemia. Gray-white matter differentiation otherwise maintained. No acute or chronic intracranial blood products. Incidental note made of a tiny subcentimeter lipoma at the right lateral margin of the medulla (series 11, image 11). No other mass lesion, mass effect or midline shift. No hydrocephalus or extra-axial fluid collection. Pituitary gland suprasellar region within  normal limits. Vascular: Left V4 segment hypoplastic and not well seen. Major intracranial vascular flow voids are otherwise maintained. Skull and upper cervical spine: Craniocervical junction within normal limits. Bone marrow signal intensity within normal limits. No scalp soft tissue abnormality. Sinuses/Orbits: Globes orbital soft tissues demonstrate no acute finding. Scattered mucosal thickening noted throughout the paranasal sinuses. No significant mastoid effusion. Other: None. IMPRESSION: 1. No acute intracranial abnormality. 2. Age-related cerebral atrophy with moderate chronic small vessel ischemic disease, with a few scattered remote infarcts involving the right thalamus and right greater than left cerebellum. Electronically Signed   By: Rise Mu M.D.   On: 01/12/2022 03:44   CT Head Wo Contrast  Result Date: 01/11/2022 CLINICAL DATA:  Dizziness EXAM: CT HEAD WITHOUT CONTRAST TECHNIQUE: Contiguous axial images were obtained from the base of the skull through the vertex without intravenous contrast. RADIATION DOSE REDUCTION: This exam was performed according to the departmental dose-optimization program which includes automated exposure control, adjustment of the mA and/or kV according to patient size and/or use of iterative reconstruction technique. COMPARISON:  None Available. FINDINGS: Brain: No acute territorial infarction, hemorrhage or intracranial mass. Mild atrophy. Patchy bilateral white matter hypodensity, consistent with chronic small vessel ischemic change. 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 Nonenlarged ventricles. Age indeterminate hypodensity in the right thalamus. Vascular: No hyperdense vessels.  Carotid vascular calcification Skull: Normal. Negative for fracture or focal lesion. Sinuses/Orbits: No acute finding. Other: None IMPRESSION: 1. Atrophy and chronic small vessel ischemic changes of the white matter. Age indeterminate hypodensity/lacunar infarct in the  right thalamus. Small chronic right cerebellar infarct. Electronically Signed   By: Jasmine Pang M.D.   On: 01/11/2022 21:18   DG Chest 2 View  Result Date: 01/11/2022 CLINICAL DATA:  Weakness EXAM: CHEST - 2 VIEW COMPARISON:  12/07/2021 FINDINGS: Heart and mediastinal contours are within normal limits. No focal opacities or effusions. No acute bony abnormality. Tortuous aorta with calcifications. IMPRESSION: No active cardiopulmonary disease. Electronically Signed   By: Charlett Nose M.D.   On: 01/11/2022 21:10    Labs:  CBC: Recent Labs    01/11/22 1812 01/28/22 1525 01/29/22 0500 01/30/22 0313  WBC 8.5 19.7* 25.0* 25.2*  HGB 13.4 12.9 15.1* 13.8  HCT 42.1 39.2 44.1 43.1  PLT 201 208 257 186    COAGS: Recent Labs    01/11/22 2051 01/28/22 1909 01/29/22 0500 01/30/22 0750  INR 1.7* 2.7* 2.7* 1.9*    BMP: Recent Labs    01/11/22 1812 01/28/22 1525 01/29/22 0500 01/30/22 0313  NA 136 129* 132* 136  K 4.9 4.3 4.5 4.4  CL 108 99 99 104  CO2 20* 22 24 20*  GLUCOSE 91 162* 130* 103*  BUN 21 13 13 18   CALCIUM 8.8* 8.3* 8.6* 8.8*  CREATININE 1.04* 0.70 0.88 1.18*  GFRNONAA 54* >60 >60 47*    LIVER FUNCTION TESTS: Recent Labs    07/14/21 1331 01/28/22  1525 01/29/22 0500 01/30/22 0313  BILITOT 1.3* 1.6* 2.1* 2.2*  AST 41 18 16 15   ALT 20 17 16 14   ALKPHOS 93 75 79 71  PROT 6.9 7.2 7.7 7.6  ALBUMIN 3.3* 3.3* 3.4* 3.3*     Assessment and Plan: Acute cholecystitis. Not an immediate surgical candidate. Imaging reviewed. Plan for per chole drain placement if pt and family agreeable. Discussed with pt ans well as with daughter via telephone regarding the procedure itself as well as the need for drain to remain minimum 6-8 weeks before cholangiogram/removal can be considered and /or may still be scheduled for cholecystectomy. Pt daughter will further discuss with her mother and other family before making decision. Risks and benefits discussed including, but not  limited to bleeding, infection, gallbladder perforation, bile leak, sepsis or even death.      Electronically Signed: , PA-C 01/30/2022, 2:38 PM   I spent a total of 25 in face to face in clinical consultation, greater than 50% of which was counseling/coordinating care for perc chole drain for cholecystitis

## 2022-01-30 NOTE — Progress Notes (Signed)
Additional conversion with daughters via telephone. Procedure discussed again and all questions answered. They agreeable to procedure and have given consent.  Brayton El PA-C Interventional Radiology 01/30/2022 3:23 PM

## 2022-01-31 DIAGNOSIS — A4151 Sepsis due to Escherichia coli [E. coli]: Secondary | ICD-10-CM

## 2022-01-31 DIAGNOSIS — D72825 Bandemia: Secondary | ICD-10-CM

## 2022-01-31 DIAGNOSIS — I4891 Unspecified atrial fibrillation: Secondary | ICD-10-CM | POA: Diagnosis not present

## 2022-01-31 DIAGNOSIS — I5032 Chronic diastolic (congestive) heart failure: Secondary | ICD-10-CM

## 2022-01-31 DIAGNOSIS — E1165 Type 2 diabetes mellitus with hyperglycemia: Secondary | ICD-10-CM

## 2022-01-31 DIAGNOSIS — Z7952 Long term (current) use of systemic steroids: Secondary | ICD-10-CM

## 2022-01-31 DIAGNOSIS — K810 Acute cholecystitis: Secondary | ICD-10-CM | POA: Diagnosis not present

## 2022-01-31 DIAGNOSIS — Z872 Personal history of diseases of the skin and subcutaneous tissue: Secondary | ICD-10-CM

## 2022-01-31 DIAGNOSIS — I10 Essential (primary) hypertension: Secondary | ICD-10-CM

## 2022-01-31 DIAGNOSIS — I959 Hypotension, unspecified: Secondary | ICD-10-CM

## 2022-01-31 LAB — BASIC METABOLIC PANEL
Anion gap: 9 (ref 5–15)
BUN: 27 mg/dL — ABNORMAL HIGH (ref 8–23)
CO2: 20 mmol/L — ABNORMAL LOW (ref 22–32)
Calcium: 8.4 mg/dL — ABNORMAL LOW (ref 8.9–10.3)
Chloride: 106 mmol/L (ref 98–111)
Creatinine, Ser: 1.14 mg/dL — ABNORMAL HIGH (ref 0.44–1.00)
GFR, Estimated: 49 mL/min — ABNORMAL LOW (ref >60)
Glucose, Bld: 100 mg/dL — ABNORMAL HIGH (ref 70–99)
Potassium: 3.7 mmol/L (ref 3.5–5.1)
Sodium: 135 mmol/L (ref 135–145)

## 2022-01-31 LAB — HEPARIN LEVEL (UNFRACTIONATED)
Heparin Unfractionated: 0.18 IU/mL — ABNORMAL LOW (ref 0.30–0.70)
Heparin Unfractionated: 0.33 IU/mL (ref 0.30–0.70)
Heparin Unfractionated: 0.47 IU/mL (ref 0.30–0.70)

## 2022-01-31 LAB — CBC WITH DIFFERENTIAL/PLATELET
Abs Immature Granulocytes: 0.09 10*3/uL — ABNORMAL HIGH (ref 0.00–0.07)
Basophils Absolute: 0 10*3/uL (ref 0.0–0.1)
Basophils Relative: 0 %
Eosinophils Absolute: 0 10*3/uL (ref 0.0–0.5)
Eosinophils Relative: 0 %
HCT: 37.4 % (ref 36.0–46.0)
Hemoglobin: 12.2 g/dL (ref 12.0–15.0)
Immature Granulocytes: 1 %
Lymphocytes Relative: 7 %
Lymphs Abs: 1.1 10*3/uL (ref 0.7–4.0)
MCH: 27.5 pg (ref 26.0–34.0)
MCHC: 32.6 g/dL (ref 30.0–36.0)
MCV: 84.4 fL (ref 80.0–100.0)
Monocytes Absolute: 0.9 10*3/uL (ref 0.1–1.0)
Monocytes Relative: 6 %
Neutro Abs: 13.6 10*3/uL — ABNORMAL HIGH (ref 1.7–7.7)
Neutrophils Relative %: 86 %
Platelets: 167 10*3/uL (ref 150–400)
RBC: 4.43 MIL/uL (ref 3.87–5.11)
RDW: 16.5 % — ABNORMAL HIGH (ref 11.5–15.5)
WBC: 15.8 10*3/uL — ABNORMAL HIGH (ref 4.0–10.5)
nRBC: 0 % (ref 0.0–0.2)

## 2022-01-31 LAB — HEMOGLOBIN A1C
Hgb A1c MFr Bld: 6.8 % — ABNORMAL HIGH (ref 4.8–5.6)
Mean Plasma Glucose: 148.46 mg/dL

## 2022-01-31 LAB — GLUCOSE, CAPILLARY
Glucose-Capillary: 126 mg/dL — ABNORMAL HIGH (ref 70–99)
Glucose-Capillary: 90 mg/dL (ref 70–99)
Glucose-Capillary: 172 mg/dL — ABNORMAL HIGH (ref 70–99)
Glucose-Capillary: 182 mg/dL — ABNORMAL HIGH (ref 70–99)
Glucose-Capillary: 213 mg/dL — ABNORMAL HIGH (ref 70–99)

## 2022-01-31 LAB — MAGNESIUM: Magnesium: 2.3 mg/dL (ref 1.7–2.4)

## 2022-01-31 MED ORDER — WARFARIN SODIUM 4 MG PO TABS
4.0000 mg | ORAL_TABLET | Freq: Once | ORAL | Status: AC
Start: 2022-01-31 — End: 2022-01-31
  Administered 2022-01-31: 4 mg via ORAL
  Filled 2022-01-31: qty 1

## 2022-01-31 MED ORDER — INSULIN ASPART 100 UNIT/ML IJ SOLN
0.0000 [IU] | Freq: Every day | INTRAMUSCULAR | Status: DC
  Administered 2022-01-31: 2 [IU] via SUBCUTANEOUS

## 2022-01-31 MED ORDER — INSULIN ASPART 100 UNIT/ML IJ SOLN
0.0000 [IU] | Freq: Three times a day (TID) | INTRAMUSCULAR | Status: DC
  Administered 2022-01-31: 3 [IU] via SUBCUTANEOUS
  Administered 2022-02-01 (×2): 2 [IU] via SUBCUTANEOUS

## 2022-01-31 MED ORDER — SODIUM CHLORIDE 0.9 % IV BOLUS
500.0000 mL | Freq: Once | INTRAVENOUS | Status: AC
Start: 2022-01-31 — End: 2022-01-31
  Administered 2022-01-31: 500 mL via INTRAVENOUS

## 2022-01-31 MED ORDER — DILTIAZEM HCL 60 MG PO TABS
60.0000 mg | ORAL_TABLET | Freq: Four times a day (QID) | ORAL | Status: DC
  Administered 2022-01-31: 60 mg via ORAL
  Filled 2022-01-31: qty 1

## 2022-01-31 MED ORDER — SODIUM CHLORIDE 0.9 % IV SOLN: INTRAVENOUS | Status: DC | PRN

## 2022-01-31 MED ORDER — ACETAMINOPHEN 650 MG RE SUPP
650.0000 mg | Freq: Four times a day (QID) | RECTAL | Status: DC | PRN
Start: 2022-01-31 — End: 2022-02-01

## 2022-01-31 MED ORDER — WARFARIN - PHARMACIST DOSING INPATIENT: Freq: Every day | Status: DC

## 2022-01-31 MED ORDER — DILTIAZEM HCL 30 MG PO TABS
30.0000 mg | ORAL_TABLET | Freq: Four times a day (QID) | ORAL | Status: DC
  Administered 2022-01-31 – 2022-02-01 (×3): 30 mg via ORAL
  Filled 2022-01-31 (×3): qty 1

## 2022-01-31 MED ORDER — POTASSIUM CHLORIDE CRYS ER 20 MEQ PO TBCR
40.0000 meq | EXTENDED_RELEASE_TABLET | Freq: Once | ORAL | Status: AC
  Administered 2022-01-31: 40 meq via ORAL
  Filled 2022-01-31: qty 2

## 2022-01-31 NOTE — Progress Notes (Signed)
Patient in the chair. Oral cardizem given. Will turn off IV cardizem at 1300 per Alanda Slim, MD verbal orders. BP 94/59 (68) with irregular HR in the 70's-90's.

## 2022-01-31 NOTE — Progress Notes (Signed)
PROGRESS NOTE  Jill Crane ZWC:[id number] DOB: [birth year]   PCP: Zoila Shutter, MD  Patient is from: Home.  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  She was started on IV Zosyn.  General surgery and IR consulted.  She had percutaneous drain placed by IR on 7/17.   Hospital course complicated by A-fib with RVR requiring IV Cardizem.  Therapy evaluation pending.  Subjective: Seen and examined earlier this morning.  No major events overnight of this morning.  She remains in A-fib but rate controlled on IV Cardizem at 5 mg/h.  Has no complaints but not a great historian.  She is oriented x4 except date.  She denies pain, shortness of breath, dizziness, GI or UTI symptoms.  Objective: Vitals:   01/31/22 1106 01/31/22 1130 01/31/22 1203 01/31/22 1300  BP: 95/64 (!) 105/49 97/64 (!) 83/47  Pulse:      Resp: (!) 26 (!) 23 18 (!) 27  Temp: 97.7 F (36.5 C)  98.2 F (36.8 C)   TempSrc: Axillary  Oral   SpO2:      Weight:      Height:        Examination:  GENERAL: No apparent distress.  Nontoxic. HEENT: MMM.  Vision and hearing grossly intact.  NECK: Supple.  No apparent JVD.  RESP:  No IWOB.  Fair aeration bilaterally. CVS: Irregular rhythm.  Rate in 70s.  Heart sounds normal.  ABD/GI/GU: BS+. Abd soft, NTND.  Perc drain in place. MSK/EXT:  Moves extremities. No apparent deformity. No edema.  SKIN: no apparent skin lesion or wound NEURO: Awake, alert and oriented appropriately.  No apparent focal neuro deficit. PSYCH: Calm. Normal affect.   Procedures:  7/17-percutaneous cholecystostomy tube placement  Microbiology  summarized: MRSA PCR screen positive. Biliary fluid culture with GNR  Assessment and plan: Principal Problem:   Acute cholecystitis Active Problems:   Sepsis (HCC)   Diabetes (HCC)   Essential hypertension   Current chronic use of systemic steroids   Chronic diastolic CHF (congestive heart failure) (HCC)   Atrial fibrillation with RVR (HCC)   Hypotension   Bandemia   History of pemphigus  Sepsis secondary to acute cholecystitis -S/p percutaneous cholecystostomy on 7/17 -Fluid culture with GNR.  Continue IV Zosyn.  Culture speciation and sensitivity -Possible lap chole about 6 weeks later -OOB/PT/OT -Advance to soft diet.  Atrial flutter with RVR: RVR resolved. -Transition to p.o. Cardizem 60 mg every 6 hours -Continue IV heparin.  Resume warfarin per pharmacy -Optimize electrolytes   Controlled NIDDM-2 with hyperglycemia and neuropathy: A1c 7.0% about a year ago.  Seems to be on metformin at home. Recent Labs  Lab 01/30/22 2003 01/30/22 2335 01/31/22 0326 01/31/22 0722 01/31/22 1105  GLUCAP 97 90 126* 90 172*  -Check hemoglobin A1c -CBG monitoring ACHS and SSI-sensitive -Continue holding metformin. -Continue home gabapentin.  Chronic diastolic CHF: Stable.  Appears euvolemic on exam. Essential hypertension: Soft blood pressure. -P.o. Cardizem as above -IV NS bolus 500 cc x 1.  May consider stress dose steroid if no improvement. -Continue holding home metoprolol   History of pemphigus  -on chronic steroids with prednisone 7.5 mg daily -  May need to stress dose steroid   Peripheral vascular disease  -Per daughter, patient is no longer on aspirin or Pletal.   Anxiety/depression -Continue buspirone 10 mg twice daily  Leukocytosis/bandemia: Likely due to #1 and steroid.  Improved. -Continue monitoring  Body mass index is 26.38 kg/m.           DVT prophylaxis:  On full dose anticoagulation.  Code Status: Full code Family Communication: None at  bedside Level of care: Stepdown Status is: Inpatient Remains inpatient appropriate because: A-fib with RVR, sepsis due to acute cholecystitis and hypotension   Final disposition: Home with home meds once medically stable Consultants:  General surgery Interventional radiology  Sch Meds:  Scheduled Meds:  brimonidine  1 drop Left Eye Q8H   busPIRone  10 mg Oral BID   Chlorhexidine Gluconate Cloth  6 each Topical Daily   diltiazem  30 mg Oral Q6H   dorzolamide  1 drop Both Eyes QHS   gabapentin  300 mg Oral QHS   insulin aspart  0-15 Units Subcutaneous TID WC   insulin aspart  0-5 Units Subcutaneous QHS   mupirocin ointment  1 Application Nasal BID   predniSONE  7.5 mg Oral Q breakfast   sodium chloride flush  5 mL Intracatheter Q8H   Continuous Infusions:  sodium chloride Stopped (01/31/22 1250)   diltiazem (CARDIZEM) infusion Stopped (01/31/22 1249)   heparin 1,150 Units/hr (01/31/22 1300)   piperacillin-tazobactam (ZOSYN)  IV 12.5 mL/hr at 01/31/22 1300   sodium chloride     PRN Meds:.sodium chloride, acetaminophen, albuterol, fluocinonide ointment, fluticasone, mouth rinse  Antimicrobials: Anti-infectives (From admission, onward)    Start     Dose/Rate Route Frequency Ordered Stop   01/29/22 0445  piperacillin-tazobactam (ZOSYN) IVPB 3.375 g        3.375 g 12.5 mL/hr over 240 Minutes Intravenous Every 8 hours 01/29/22 0354     01/28/22 1900  piperacillin-tazobactam (ZOSYN) IVPB 3.375 g        3.375 g 100 mL/hr over 30 Minutes Intravenous  Once 01/28/22 1846 01/28/22 1935        I have personally reviewed the following labs and images: CBC: Recent Labs  Lab 01/28/22 1525 01/29/22 0500 01/30/22 0313 01/31/22 0503  WBC 19.7* 25.0* 25.2* 15.8*  NEUTROABS  --  20.8* 21.7* 13.6*  HGB 12.9 15.1* 13.8 12.2  HCT 39.2 44.1 43.1 37.4  MCV 83.1 84.5 85.2 84.4  PLT 208 257 186 167   BMP &GFR Recent Labs  Lab 01/28/22 1525 01/29/22 0500 01/30/22 0313  01/31/22 0503  NA 129* 132* 136 135  K 4.3 4.5 4.4 3.7  CL 99 99 104 106  CO2 22 24 20* 20*  GLUCOSE 162* 130* 103* 100*  BUN 13 13 18  27*  CREATININE 0.70 0.88 1.18* 1.14*  CALCIUM 8.3* 8.6* 8.8* 8.4*  MG  --   --   --  2.3   Estimated Creatinine Clearance: 39.1 mL/min (A) (by C-G formula based on SCr of 1.14 mg/dL (H)). Liver & Pancreas: Recent Labs  Lab 01/28/22 1525 01/29/22 0500 01/30/22 0313  AST 18 16 15   ALT 17 16 14   ALKPHOS 75 79 71  BILITOT 1.6* 2.1* 2.2*  PROT 7.2 7.7 7.6  ALBUMIN 3.3* 3.4* 3.3*   Recent Labs  Lab 01/28/22 1525  LIPASE 19   No results for input(s): "AMMONIA" in the last 168 hours. Diabetic: No results for input(s): "HGBA1C" in the last 72 hours. Recent Labs  Lab 01/30/22  2003 01/30/22 2335 01/31/22 0326 01/31/22 0722 01/31/22 1105  GLUCAP 97 90 126* 90 172*   Cardiac Enzymes: No results for input(s): "CKTOTAL", "CKMB", "CKMBINDEX", "TROPONINI" in the last 168 hours. No results for input(s): "PROBNP" in the last 8760 hours. Coagulation Profile: Recent Labs  Lab 01/28/22 1909 01/29/22 0500 01/30/22 0750  INR 2.7* 2.7* 1.9*   Thyroid Function Tests: Recent Labs    01/29/22 0551  TSH 0.757   Lipid Profile: No results for input(s): "CHOL", "HDL", "LDLCALC", "TRIG", "CHOLHDL", "LDLDIRECT" in the last 72 hours. Anemia Panel: No results for input(s): "VITAMINB12", "FOLATE", "FERRITIN", "TIBC", "IRON", "RETICCTPCT" in the last 72 hours. Urine analysis:    Component Value Date/Time   COLORURINE YELLOW 01/11/2022 1812   APPEARANCEUR CLEAR 01/11/2022 1812   LABSPEC 1.010 01/11/2022 1812   PHURINE 5.5 01/11/2022 1812   GLUCOSEU NEGATIVE 01/11/2022 1812   HGBUR TRACE (A) 01/11/2022 1812   BILIRUBINUR NEGATIVE 01/11/2022 1812   KETONESUR NEGATIVE 01/11/2022 1812   PROTEINUR NEGATIVE 01/11/2022 1812   UROBILINOGEN 1.0 08/16/2014 0423   NITRITE NEGATIVE 01/11/2022 1812   LEUKOCYTESUR TRACE (A) 01/11/2022 1812   Sepsis  Labs: Invalid input(s): "PROCALCITONIN", "LACTICIDVEN"  Microbiology: Recent Results (from the past 240 hour(s))  MRSA Next Gen by PCR, Nasal     Status: Abnormal   Collection Time: 01/29/22  8:27 AM   Specimen: Nasal Mucosa; Nasal Swab  Result Value Ref Range Status   MRSA by PCR Next Gen DETECTED (A) NOT DETECTED Final    Comment: RESULT CALLED TO, READ BACK BY AND VERIFIED WITH: Stephan Minister RN AT 1423 01/29/22 BY TIBBITTS, K (NOTE) The GeneXpert MRSA Assay (FDA approved for NASAL specimens only), is one component of a comprehensive MRSA colonization surveillance program. It is not intended to diagnose MRSA infection nor to guide or monitor treatment for MRSA infections. Test performance is not FDA approved in patients less than 60 years old. Performed at [facility], [address] [place], [place] [address]   Aerobic/Anaerobic Culture w Gram Stain (surgical/deep wound)     Status: None (Preliminary result)   Collection Time: 01/30/22  5:14 PM   Specimen: Gallbladder; Abscess  Result Value Ref Range Status   Specimen Description   Final    GALL BLADDER Performed at [facility], [address] [place], [place] [address]    Special Requests   Final    NONE Performed at [facility], [address] [place], [place] [address]    Gram Stain NO WBC SEEN MODERATE GRAM NEGATIVE RODS   Final   Culture   Final    ABUNDANT GRAM NEGATIVE RODS SUSCEPTIBILITIES TO FOLLOW Performed at [facility], [address], [place], [place] [address]    Report Status PENDING  Incomplete    Radiology Studies: IR Perc Cholecystostomy  Result Date: 01/31/2022 INDICATION: 81 year old female with acute calculus cholecystitis, poor surgical candidate due to multiple comorbidities. EXAM: Fluoroscopic and ultrasound-guided percutaneous cholecystostomy tube placement MEDICATIONS: The patient was receiving intravenous antibiotics as an  inpatient, administered in the appropriate time frame prior to the start of this procedure none. ANESTHESIA/SEDATION: Moderate (conscious) sedation was employed during this procedure. A total of Versed 0.5 mg and Fentanyl 25 mcg was administered intravenously. Moderate Sedation Time: 10 minutes. The patient's level of consciousness and vital signs were monitored continuously by radiology nursing throughout the procedure under my direct supervision. FLUOROSCOPY TIME:  Four mGy CONTRAST:  5 mL Omnipaque 300, into  the gallbladder COMPLICATIONS: None immediate. PROCEDURE: Informed written consent was obtained from the patient after a thorough discussion of the procedural risks, benefits and alternatives. All questions were addressed. Maximal Sterile Barrier Technique was utilized including caps, mask, sterile gowns, sterile gloves, sterile drape, hand hygiene and skin antiseptic. A timeout was performed prior to the initiation of the procedure. The patient was placed supine on the angiographic table. The patient's right upper quadrant was then prepped and draped in normal sterile fashion with maximum sterile barrier. Ultrasound demonstrates a distended gallbladder. Subdermal Local anesthesia was provided at the planned skin entry site. Under ultrasound guidance, deeper local anesthetic was provided through abdominal oblique muscles and along the surface of the gallbladder. Ultrasound was used to puncture the gallbladder using a 21 gauge Chiba needle via a transperitoneal/subhepatic approach with visualization of the lung treated to the gallbladder. A 0.018 inch wire was advanced into the lumen and a transition dilator placed. A gentle hand injection of contrast was performed. Cholecystogram demonstrates irregular filling defects consistent with gallstones. The cystic duct is obstructed. A 0.035 inch exchange wire was placed in the tract was dilated. A 10.2 French multipurpose drainage catheter was advanced into the  gallbladder lumen. The drain was then secured in place using a 0-silk suture and a Stayfix device. A sterile dressing was applied. The tube was placed to bulb suction. A culture was sent to the lab for analysis. The patient tolerated procedure well without evidence of immediate complication was transferred back to the floor in stable condition. IMPRESSION: Successful placement of percutaneous, transperitoneal/subhepatic cholecystostomy tube. If the patient is deemed a poor operative candidate indefinitely, the patient may be considered for choledochoscopic-assisted (Spyglass) percutaneous gallstone retrieval by IR for eventual tube removal. Marliss Coots, MD [facility] Radiology Electronically Signed   By: Marliss Coots M.D.   On: 01/31/2022 07:52      Jameka Ivie T. Davison Ohms [facility]  If 7PM-7AM, please contact night-coverage [email or web address] 01/31/2022, 2:24 PM

## 2022-01-31 NOTE — Progress Notes (Signed)
Subjective/Chief Complaint: Comfortable this morning Abdominal pain mild Tolerated perc chole tube placement   Objective: Vital signs in last 24 hours: Temp:  [97.1 F (36.2 C)-99.7 F (37.6 C)] 97.7 F (36.5 C) (07/18 1106) Pulse Rate:  [49-102] 67 (07/18 0600) Resp:  [18-35] 18 (07/18 1203) BP: (45-133)/(27-91) 97/64 (07/18 1203) SpO2:  [96 %-100 %] 97 % (07/18 0700) Last BM Date : 01/28/22  Intake/Output from previous day: 07/17 0701 - 07/18 0700 In: 258.5 [I.V.:136.5; IV Piggyback:112] Out: 1130 [Urine:1000; Drains:130] Intake/Output this shift: Total I/O In: 685.3 [P.O.:420; I.V.:208.8; IV Piggyback:56.5] Out: 315 [Urine:300; Drains:15]  Exam: Awake and alert Abdomen soft, drain bilous, mildly tender  Lab Results:  Recent Labs    01/30/22 0313 01/31/22 0503  WBC 25.2* 15.8*  HGB 13.8 12.2  HCT 43.1 37.4  PLT 186 167   BMET Recent Labs    01/30/22 0313 01/31/22 0503  NA 136 135  K 4.4 3.7  CL 104 106  CO2 20* 20*  GLUCOSE 103* 100*  BUN 18 27*  CREATININE 1.18* 1.14*  CALCIUM 8.8* 8.4*   PT/INR Recent Labs    01/29/22 0500 01/30/22 0750  LABPROT 28.5* 21.2*  INR 2.7* 1.9*   ABG No results for input(s): "PHART", "HCO3" in the last 72 hours.  Invalid input(s): "PCO2", "PO2"  Studies/Results: IR Perc Cholecystostomy  Result Date: 01/31/2022 INDICATION: 81 year old female with acute calculus cholecystitis, poor surgical candidate due to multiple comorbidities. EXAM: Fluoroscopic and ultrasound-guided percutaneous cholecystostomy tube placement MEDICATIONS: The patient was receiving intravenous antibiotics as an inpatient, administered in the appropriate time frame prior to the start of this procedure none. ANESTHESIA/SEDATION: Moderate (conscious) sedation was employed during this procedure. A total of Versed 0.5 mg and Fentanyl 25 mcg was administered intravenously. Moderate Sedation Time: 10 minutes. The patient's level of consciousness  and vital signs were monitored continuously by radiology nursing throughout the procedure under my direct supervision. FLUOROSCOPY TIME:  Four mGy CONTRAST:  5 mL Omnipaque 300, into the gallbladder COMPLICATIONS: None immediate. PROCEDURE: Informed written consent was obtained from the patient after a thorough discussion of the procedural risks, benefits and alternatives. All questions were addressed. Maximal Sterile Barrier Technique was utilized including caps, mask, sterile gowns, sterile gloves, sterile drape, hand hygiene and skin antiseptic. A timeout was performed prior to the initiation of the procedure. The patient was placed supine on the angiographic table. The patient's right upper quadrant was then prepped and draped in normal sterile fashion with maximum sterile barrier. Ultrasound demonstrates a distended gallbladder. Subdermal Local anesthesia was provided at the planned skin entry site. Under ultrasound guidance, deeper local anesthetic was provided through abdominal oblique muscles and along the surface of the gallbladder. Ultrasound was used to puncture the gallbladder using a 21 gauge Chiba needle via a transperitoneal/subhepatic approach with visualization of the lung treated to the gallbladder. A 0.018 inch wire was advanced into the lumen and a transition dilator placed. A gentle hand injection of contrast was performed. Cholecystogram demonstrates irregular filling defects consistent with gallstones. The cystic duct is obstructed. A 0.035 inch exchange wire was placed in the tract was dilated. A 10.2 French multipurpose drainage catheter was advanced into the gallbladder lumen. The drain was then secured in place using a 0-silk suture and a Stayfix device. A sterile dressing was applied. The tube was placed to bulb suction. A culture was sent to the lab for analysis. The patient tolerated procedure well without evidence of immediate complication was transferred back to  the floor in stable  condition. IMPRESSION: Successful placement of percutaneous, transperitoneal/subhepatic cholecystostomy tube. If the patient is deemed a poor operative candidate indefinitely, the patient may be considered for choledochoscopic-assisted (Spyglass) percutaneous gallstone retrieval by IR for eventual tube removal. Marliss Coots, MD [facility] Radiology Electronically Signed   By: Marliss Coots M.D.   On: 01/31/2022 07:52    Anti-infectives: Anti-infectives (From admission, onward)    Start     Dose/Rate Route Frequency Ordered Stop   01/29/22 0445  piperacillin-tazobactam (ZOSYN) IVPB 3.375 g        3.375 g 12.5 mL/hr over 240 Minutes Intravenous Every 8 hours 01/29/22 0354     01/28/22 1900  piperacillin-tazobactam (ZOSYN) IVPB 3.375 g        3.375 g 100 mL/hr over 30 Minutes Intravenous  Once 01/28/22 1846 01/28/22 1935       Assessment/Plan: Acute cholecystitis  Afib Chronic CHF On anticoag meds T2DM COPD HTN  Appreciated IR's placement of percutaneous cholecystostomy tube.  Plan to leave in approx 6 weeks and will consider lap chole depending on the patient's wishes WBC improved Will advance diet   Abigail Miyamoto MD 01/31/2022

## 2022-01-31 NOTE — Progress Notes (Signed)
ANTICOAGULATION CONSULT NOTE - Follow Up Consult  Pharmacy Consult for heparin Indication: atrial fibrillation  No Known Allergies  Patient Measurements: Height: 5\' 5"  (165.1 cm) Weight: 71.9 kg (158 lb 8.2 oz) IBW/kg (Calculated) : 57 Heparin Dosing Weight: 71 kg  Vital Signs: Temp: 97.4 F (36.3 C) (07/18 0332) Temp Source: Oral (07/18 0332) BP: 90/59 (07/18 0510) Pulse Rate: 86 (07/18 0510)  Labs: Recent Labs    01/28/22 1525 01/28/22 1909 01/29/22 0500 01/30/22 0313 01/30/22 0750 01/31/22 0503  HGB 12.9  --  15.1* 13.8  --  12.2  HCT 39.2  --  44.1 43.1  --  37.4  PLT 208  --  257 186  --  167  LABPROT  --  28.7* 28.5*  --  21.2*  --   INR  --  2.7* 2.7*  --  1.9*  --   HEPARINUNFRC  --   --   --   --   --  0.18*  CREATININE 0.70  --  0.88 1.18*  --  1.14*  TROPONINIHS 7  --  11  --   --   --      Estimated Creatinine Clearance: 39.1 mL/min (A) (by C-G formula based on SCr of 1.14 mg/dL (H)).   Medical History: Past Medical History:  Diagnosis Date   Atrial fibrillation with normal ventricular rate (HCC)    CHF (congestive heart failure) (HCC)    COPD (chronic obstructive pulmonary disease) (HCC)    Diabetes mellitus without complication (HCC)    Hx of blood clots    Hypertension       Assessment: 81 year old female with history of afib on warfarin PTA which has been held for procedure. Presented with right upper quadrant pain; CT abdomen/pelvis suggestive of acute cholecystitis. Patient is s/p IR placement of percutaneous cholecystectomy tube. Pharmacy consulted to start heparin.   01/31/22 Heparin level = 0.18 (subtherapeutic) with heparin gtt @ 1000 units/hr Hgb = 12.2, Pltc 167K No complications of therapy noted; no disruption of heparin infusion noted  Goal of Therapy:  Heparin level 0.3-0.7 units/ml Monitor platelets by anticoagulation protocol: Yes   Plan:  - Increase heparin gtt to 1150 units/hr - Check heparin level 8 hr after rate  increase -Monitor CBC, signs of bleeding -Follow up appropriate restart of warfarin  02/02/22, PharmD  01/31/2022 5:56 AM

## 2022-01-31 NOTE — Plan of Care (Signed)
  Problem: Education: Goal: Knowledge of General Education information will improve Description: Including pain rating scale, medication(s)/side effects and non-pharmacologic comfort measures Outcome: Progressing   Problem: Health Behavior/Discharge Planning: Goal: Ability to manage health-related needs will improve Outcome: Progressing   Problem: Clinical Measurements: Goal: Ability to maintain clinical measurements within normal limits will improve Outcome: Progressing Goal: Will remain free from infection Outcome: Progressing Goal: Diagnostic test results will improve Outcome: Progressing Goal: Respiratory complications will improve Outcome: Progressing Goal: Cardiovascular complication will be avoided Outcome: Progressing   Problem: Activity: Goal: Risk for activity intolerance will decrease Outcome: Progressing   Problem: Nutrition: Goal: Adequate nutrition will be maintained Outcome: Progressing   Problem: Coping: Goal: Level of anxiety will decrease Outcome: Progressing   Problem: Elimination: Goal: Will not experience complications related to bowel motility Outcome: Progressing Goal: Will not experience complications related to urinary retention Outcome: Progressing   Problem: Pain Managment: Goal: General experience of comfort will improve Outcome: Progressing   Problem: Safety: Goal: Ability to remain free from injury will improve Outcome: Progressing   Problem: Skin Integrity: Goal: Risk for impaired skin integrity will decrease Outcome: Progressing   Problem: Coping: Goal: Ability to adjust to condition or change in health will improve Outcome: Progressing   Problem: Fluid Volume: Goal: Ability to maintain a balanced intake and output will improve Outcome: Progressing   Problem: Metabolic: Goal: Ability to maintain appropriate glucose levels will improve Outcome: Progressing   Problem: Nutritional: Goal: Maintenance of adequate nutrition  will improve Outcome: Progressing Goal: Progress toward achieving an optimal weight will improve Outcome: Progressing   Problem: Skin Integrity: Goal: Risk for impaired skin integrity will decrease Outcome: Progressing   Problem: Tissue Perfusion: Goal: Adequacy of tissue perfusion will improve Outcome: Progressing   Jill S. Clelia Croft BSN, RN, CCRP 01/31/2022 3:13 AM

## 2022-01-31 NOTE — Progress Notes (Signed)
ANTICOAGULATION CONSULT NOTE - Follow Up Consult  Pharmacy Consult for heparin + warfarin Indication: atrial fibrillation  No Known Allergies  Patient Measurements: Height: 5\' 5"  (165.1 cm) Weight: 71.9 kg (158 lb 8.2 oz) IBW/kg (Calculated) : 57 Heparin Dosing Weight: 71 kg  Vital Signs: Temp: 98.2 F (36.8 C) (07/18 1203) Temp Source: Oral (07/18 1203) BP: 83/47 (07/18 1300) Pulse Rate: 67 (07/18 0600)  Labs: Recent Labs    01/28/22 1525 01/28/22 1909 01/29/22 0500 01/30/22 0313 01/30/22 0750 01/31/22 0503 01/31/22 1340  HGB 12.9  --  15.1* 13.8  --  12.2  --   HCT 39.2  --  44.1 43.1  --  37.4  --   PLT 208  --  257 186  --  167  --   LABPROT  --  28.7* 28.5*  --  21.2*  --   --   INR  --  2.7* 2.7*  --  1.9*  --   --   HEPARINUNFRC  --   --   --   --   --  0.18* 0.33  CREATININE 0.70  --  0.88 1.18*  --  1.14*  --   TROPONINIHS 7  --  11  --   --   --   --      Estimated Creatinine Clearance: 39.1 mL/min (A) (by C-G formula based on SCr of 1.14 mg/dL (H)).   Medical History: Past Medical History:  Diagnosis Date   Atrial fibrillation with normal ventricular rate (HCC)    CHF (congestive heart failure) (HCC)    COPD (chronic obstructive pulmonary disease) (HCC)    Diabetes mellitus without complication (HCC)    Hx of blood clots    Hypertension       Assessment: 81 year old female with history of afib on warfarin PTA which has been held for procedure. Home dose of warfarin 4 mg Sun, Mon, Wed, Fri and 2 mg Tues, Thurs, Sat. Presented with right upper quadrant pain; CT abdomen/pelvis suggestive of acute cholecystitis. Patient is s/p IR placement of percutaneous cholecystectomy tube. Pharmacy consulted to manage heparin.   Consult to resume warfarin 7/18.  01/31/22 Heparin level = 0.33, therapeutic with heparin at 1150 units/hr Hgb = 12.2, Pltc 167k No complications of therapy noted; no disruption of heparin infusion noted Warfarin to resume  today  Goal of Therapy:  Heparin level 0.3-0.7 units/ml Monitor platelets by anticoagulation protocol: Yes   Plan:  -Continue heparin infusion at 1150 units/hr -Check heparin level at 2200 to confirm -Give warfarin 4 mg today (2x home dose for Tuesday) -Monitor INR, CBC, signs of bleeding   Tuesday, PharmD, BCPS Clinical Pharmacist 01/31/2022 2:51 PM

## 2022-01-31 NOTE — Evaluation (Signed)
Physical Therapy Evaluation Patient Details Name: Jill Crane MRN: [id number] DOB: August 16, 1940 Today's Date: 01/31/2022  History of Present Illness  Jill Crane is a 81 y.o. female with PMH significant for DM2, HTN, A-fib, CHF, COPD and a history of ERCP for choledocholithiasis.  Patient presented to the ED on 7/15 with complaint of right upper quadrant abdominal pain, nausea, vomiting, low sodium, liver enzymes slightly elevated, WC count elevated,  CT abdomen and pelvis showed gallbladder wall thickening,  gallstones, suggestive of acute cholecystitis. S/P percutaneous cholecystostomy tube placement on 7/17. Onset A flutter, started on Cardiazem drip.  Clinical Impression  The patient is very alert and ready to get  into recliner. Patient did require cues and mod assistance to sit up, patient  reporting lower quadrant pain. RN aware and gave medication.  Patient  presents with some weakness to stand to pivot to recliner.  Patient  Independent  at baseline, granddaughter( who reportedly works) lives with patient. Patient should progress to return home with some assistance from  family`, most likely 24/7 initially. Pt admitted with above diagnosis.  Pt currently with functional limitations due to the deficits listed below (see PT Problem List). Pt will benefit from skilled PT to increase their independence and safety with mobility to allow discharge to the venue listed below.          Recommendations for follow up therapy are one component of a multi-disciplinary discharge planning process, led by the attending physician.  Recommendations may be updated based on patient status, additional functional criteria and insurance authorization.  Follow Up Recommendations Home health PT      Assistance Recommended at Discharge Frequent or constant Supervision/Assistance  Patient can return home with the following  A little help with walking and/or transfers;A little help with  bathing/dressing/bathroom;Assistance with cooking/housework;Assist for transportation    Equipment Recommendations Rolling walker (2 wheels)  Recommendations for Other Services       Functional Status Assessment Patient has had a recent decline in their functional status and demonstrates the ability to make significant improvements in function in a reasonable and predictable amount of time.     Precautions / Restrictions Precautions Precautions: Fall Precaution Comments: right  drain, BP can be low,      Mobility  Bed Mobility Overal bed mobility: Needs Assistance Bed Mobility: Rolling, Sidelying to Sit Rolling: Min assist Sidelying to sit: Mod assist       General bed mobility comments: cues for precautions to roll, assisted trunk to sitting upright.    Transfers Overall transfer level: Needs assistance   Transfers: Sit to/from Stand, Bed to chair/wheelchair/BSC Sit to Stand: Mod assist   Step pivot transfers: Mod assist       General transfer comment: support to stnad , patient able to reach ob=ver to recliner and moved quickly    Ambulation/Gait                  Stairs            Wheelchair Mobility    Modified Rankin (Stroke Patients Only)       Balance Overall balance assessment: Needs assistance Sitting-balance support: Bilateral upper extremity supported, Feet supported Sitting balance-Leahy Scale: Fair     Standing balance support: No upper extremity supported, Single extremity supported, During functional activity Standing balance-Leahy Scale: Poor  Pertinent Vitals/Pain Pain Assessment Pain Assessment: Faces Faces Pain Scale: Hurts even more Pain Location: right abdomen, lower Pain Descriptors / Indicators: Cramping, Grimacing, Discomfort Pain Intervention(s): Patient requesting pain meds-RN notified, RN gave pain meds during session    Home Living Family/patient expects to be  discharged to:: Private residence Living Arrangements: Other relatives;Other (Comment) (gransddaughter, who works) Available Help at Discharge: Family;Available PRN/intermittently Type of Home: House Home Access: Level entry       Home Layout: One level Home Equipment: Agricultural consultant (2 wheels) Additional Comments: patient  not clear about the Rw    Prior Function Prior Level of Function : Independent/Modified Independent             Mobility Comments: does not drive, used no DME ADLs Comments: independent     Hand Dominance   Dominant Hand: Right    Extremity/Trunk Assessment   Upper Extremity Assessment Upper Extremity Assessment: Overall WFL for tasks assessed    Lower Extremity Assessment Lower Extremity Assessment: Generalized weakness    Cervical / Trunk Assessment Cervical / Trunk Assessment: Normal  Communication   Communication: Other (comment) (patient  speaks fast and somewhat  garbled, appropriate)  Cognition Arousal/Alertness: Awake/alert Behavior During Therapy: WFL for tasks assessed/performed Overall Cognitive Status: Within Functional Limits for tasks assessed                                          General Comments      Exercises     Assessment/Plan    PT Assessment Patient needs continued PT services  PT Problem List Decreased strength;Decreased activity tolerance;Decreased mobility;Pain;Decreased knowledge of use of DME;Decreased knowledge of precautions;Decreased safety awareness;Cardiopulmonary status limiting activity       PT Treatment Interventions DME instruction;Therapeutic activities;Gait training;Functional mobility training;Therapeutic exercise;Patient/family education    PT Goals (Current goals can be found in the Care Plan section)  Acute Rehab PT Goals Patient Stated Goal: to go home PT Goal Formulation: With patient Time For Goal Achievement: 02/14/22 Potential to Achieve Goals: Good    Frequency  Min 3X/week     Co-evaluation               AM-PAC PT "6 Clicks" Mobility  Outcome Measure Help needed turning from your back to your side while in a flat bed without using bedrails?: A Little Help needed moving from lying on your back to sitting on the side of a flat bed without using bedrails?: A Lot Help needed moving to and from a bed to a chair (including a wheelchair)?: A Lot Help needed standing up from a chair using your arms (e.g., wheelchair or bedside chair)?: A Lot Help needed to walk in hospital room?: Total Help needed climbing 3-5 steps with a railing? : Total 6 Click Score: 11    End of Session   Activity Tolerance: Patient tolerated treatment well Patient left: in chair;with chair alarm set Nurse Communication: Mobility status PT Visit Diagnosis: Difficulty in walking, not elsewhere classified (R26.2);Pain Pain - Right/Left: Right    Time: 8366-2947 PT Time Calculation (min) (ACUTE ONLY): 15 min   Charges:   PT Evaluation $PT Eval Low Complexity: 1 Low          Blanchard Kelch PT Acute Rehabilitation Services Office [phone] Weekend pager-[phone]   Rada Hay 01/31/2022, 12:56 PM

## 2022-01-31 NOTE — Plan of Care (Signed)
  Problem: Education: Goal: Knowledge of General Education information will improve Description: Including pain rating scale, medication(s)/side effects and non-pharmacologic comfort measures Outcome: Progressing   Problem: Health Behavior/Discharge Planning: Goal: Ability to manage health-related needs will improve Outcome: Progressing   Problem: Clinical Measurements: Goal: Ability to maintain clinical measurements within normal limits will improve Outcome: Progressing Goal: Will remain free from infection Outcome: Progressing Goal: Diagnostic test results will improve Outcome: Progressing Goal: Respiratory complications will improve Outcome: Progressing Goal: Cardiovascular complication will be avoided Outcome: Progressing   Problem: Activity: Goal: Risk for activity intolerance will decrease Outcome: Progressing   Problem: Nutrition: Goal: Adequate nutrition will be maintained Outcome: Progressing   Problem: Coping: Goal: Level of anxiety will decrease Outcome: Progressing   Problem: Elimination: Goal: Will not experience complications related to bowel motility Outcome: Progressing Goal: Will not experience complications related to urinary retention Outcome: Progressing   Problem: Pain Managment: Goal: General experience of comfort will improve Outcome: Progressing   Problem: Safety: Goal: Ability to remain free from injury will improve Outcome: Progressing   Problem: Skin Integrity: Goal: Risk for impaired skin integrity will decrease Outcome: Progressing   Problem: Education: Goal: Ability to describe self-care measures that may prevent or decrease complications (Diabetes Survival Skills Education) will improve Outcome: Progressing Goal: Individualized Educational Video(s) Outcome: Progressing   Problem: Coping: Goal: Ability to adjust to condition or change in health will improve Outcome: Progressing   Problem: Fluid Volume: Goal: Ability to  maintain a balanced intake and output will improve Outcome: Progressing   Problem: Health Behavior/Discharge Planning: Goal: Ability to identify and utilize available resources and services will improve Outcome: Progressing Goal: Ability to manage health-related needs will improve Outcome: Progressing   Problem: Metabolic: Goal: Ability to maintain appropriate glucose levels will improve Outcome: Progressing   Problem: Nutritional: Goal: Maintenance of adequate nutrition will improve Outcome: Progressing Goal: Progress toward achieving an optimal weight will improve Outcome: Progressing   Problem: Skin Integrity: Goal: Risk for impaired skin integrity will decrease Outcome: Progressing   Problem: Tissue Perfusion: Goal: Adequacy of tissue perfusion will improve Outcome: Progressing   Problem: Fluid Volume: Goal: Hemodynamic stability will improve Outcome: Progressing   Problem: Clinical Measurements: Goal: Diagnostic test results will improve Outcome: Progressing Goal: Signs and symptoms of infection will decrease Outcome: Progressing   Problem: Respiratory: Goal: Ability to maintain adequate ventilation will improve Outcome: Progressing   

## 2022-02-01 ENCOUNTER — Other Ambulatory Visit (HOSPITAL_COMMUNITY): Payer: Self-pay

## 2022-02-01 ENCOUNTER — Other Ambulatory Visit: Payer: Self-pay | Admitting: Student

## 2022-02-01 DIAGNOSIS — D72825 Bandemia: Secondary | ICD-10-CM | POA: Diagnosis not present

## 2022-02-01 DIAGNOSIS — K810 Acute cholecystitis: Secondary | ICD-10-CM | POA: Diagnosis not present

## 2022-02-01 DIAGNOSIS — I4891 Unspecified atrial fibrillation: Secondary | ICD-10-CM | POA: Diagnosis not present

## 2022-02-01 DIAGNOSIS — I5032 Chronic diastolic (congestive) heart failure: Secondary | ICD-10-CM | POA: Diagnosis not present

## 2022-02-01 DIAGNOSIS — K819 Cholecystitis, unspecified: Secondary | ICD-10-CM

## 2022-02-01 LAB — RENAL FUNCTION PANEL
Albumin: 2.7 g/dL — ABNORMAL LOW (ref 3.5–5.0)
Anion gap: 8 (ref 5–15)
BUN: 22 mg/dL (ref 8–23)
CO2: 20 mmol/L — ABNORMAL LOW (ref 22–32)
Calcium: 9 mg/dL (ref 8.9–10.3)
Chloride: 111 mmol/L (ref 98–111)
Creatinine, Ser: 0.91 mg/dL (ref 0.44–1.00)
GFR, Estimated: >60 mL/min (ref >60)
Glucose, Bld: 112 mg/dL — ABNORMAL HIGH (ref 70–99)
Phosphorus: 2.2 mg/dL — ABNORMAL LOW (ref 2.5–4.6)
Potassium: 4.5 mmol/L (ref 3.5–5.1)
Sodium: 139 mmol/L (ref 135–145)

## 2022-02-01 LAB — CBC
HCT: 36.3 % (ref 36.0–46.0)
Hemoglobin: 11.5 g/dL — ABNORMAL LOW (ref 12.0–15.0)
MCH: 27 pg (ref 26.0–34.0)
MCHC: 31.7 g/dL (ref 30.0–36.0)
MCV: 85.2 fL (ref 80.0–100.0)
Platelets: 167 10*3/uL (ref 150–400)
RBC: 4.26 MIL/uL (ref 3.87–5.11)
RDW: 16.5 % — ABNORMAL HIGH (ref 11.5–15.5)
WBC: 10.5 10*3/uL (ref 4.0–10.5)
nRBC: 0.4 % — ABNORMAL HIGH (ref 0.0–0.2)

## 2022-02-01 LAB — PROTIME-INR
INR: 2.1 — ABNORMAL HIGH (ref 0.8–1.2)
Prothrombin Time: 23.3 seconds — ABNORMAL HIGH (ref 11.4–15.2)

## 2022-02-01 LAB — MAGNESIUM: Magnesium: 2.1 mg/dL (ref 1.7–2.4)

## 2022-02-01 LAB — HEPARIN LEVEL (UNFRACTIONATED): Heparin Unfractionated: 0.52 IU/mL (ref 0.30–0.70)

## 2022-02-01 LAB — TSH: TSH: 1.952 u[IU]/mL (ref 0.350–4.500)

## 2022-02-01 MED ORDER — INSULIN ASPART 100 UNIT/ML IJ SOLN
0.0000 [IU] | Freq: Three times a day (TID) | INTRAMUSCULAR | Status: DC
  Administered 2022-02-01: 3 [IU] via SUBCUTANEOUS
  Administered 2022-02-02: 2 [IU] via SUBCUTANEOUS

## 2022-02-01 MED ORDER — DILTIAZEM HCL 30 MG PO TABS: 30.0000 mg | ORAL_TABLET | Freq: Four times a day (QID) | ORAL | Status: DC | PRN

## 2022-02-01 MED ORDER — INSULIN ASPART 100 UNIT/ML IJ SOLN
3.0000 [IU] | Freq: Three times a day (TID) | INTRAMUSCULAR | Status: DC
Start: 2022-02-01 — End: 2022-02-02
  Administered 2022-02-01 – 2022-02-02 (×3): 3 [IU] via SUBCUTANEOUS

## 2022-02-01 MED ORDER — ACETAMINOPHEN 650 MG RE SUPP: 650.0000 mg | Freq: Four times a day (QID) | RECTAL | Status: DC | PRN

## 2022-02-01 MED ORDER — SODIUM CHLORIDE 0.9 % IV SOLN
2.0000 g | INTRAVENOUS | Status: DC
  Administered 2022-02-01: 2 g via INTRAVENOUS
  Filled 2022-02-01: qty 20

## 2022-02-01 MED ORDER — INSULIN ASPART 100 UNIT/ML IJ SOLN: 0.0000 [IU] | Freq: Every day | INTRAMUSCULAR | Status: DC

## 2022-02-01 MED ORDER — WARFARIN SODIUM 4 MG PO TABS
4.0000 mg | ORAL_TABLET | Freq: Once | ORAL | Status: AC
  Administered 2022-02-01: 4 mg via ORAL
  Filled 2022-02-01: qty 1

## 2022-02-01 MED ORDER — DILTIAZEM HCL ER COATED BEADS 180 MG PO CP24
180.0000 mg | ORAL_CAPSULE | Freq: Every day | ORAL | Status: DC
  Administered 2022-02-01 – 2022-02-02 (×2): 180 mg via ORAL
  Filled 2022-02-01 (×2): qty 1

## 2022-02-01 MED ORDER — SODIUM CHLORIDE 0.9% FLUSH
5.0000 mL | Freq: Every day | INTRAVENOUS | 1 refills | Status: DC
  Filled 2022-02-01: qty 250, 25d supply, fill #0

## 2022-02-01 MED ORDER — ACETAMINOPHEN 325 MG PO TABS
650.0000 mg | ORAL_TABLET | Freq: Four times a day (QID) | ORAL | Status: DC | PRN
  Administered 2022-02-01 – 2022-02-02 (×4): 650 mg via ORAL
  Filled 2022-02-01 (×4): qty 2

## 2022-02-01 NOTE — Progress Notes (Addendum)
Referring Physician(s): Trixie Deis   Supervising Physician: Irish Lack  Patient Status:  [facility] - In-pt  Chief Complaint:  Acute calculus cholecystitis s/p perc chole placement on 7/18   Subjective:  Patient laying in bed, NAD.  Reports mild abdominal pain today, no N/V, fever, chills  States that she may go home tomorrow   Outpatient f/u order placed Saline flush prescription sent to [facility] output pharmacy   Discussed drain care after d/c with the patient   Allergies: Patient has no known allergies.  Medications: Prior to Admission medications   Medication Sig Start Date End Date Taking? Authorizing Provider  acetaminophen (TYLENOL) 500 MG tablet Take 500 mg by mouth every 6 (six) hours as needed for mild pain.   Yes [provider]  alendronate (FOSAMAX) 35 MG tablet Take 35 mg by mouth every Monday. Take with a full glass of water on an empty stomach.   Yes [provider]  bimatoprost (LUMIGAN) 0.03 % ophthalmic solution 1 drop at bedtime.   Yes [provider]  brimonidine (ALPHAGAN) 0.2 % ophthalmic solution Place 1 drop into the left eye in the morning and at bedtime. 10/21/19  Yes [provider]  busPIRone (BUSPAR) 10 MG tablet Take 10 mg by mouth 2 (two) times daily. 08/04/20  Yes [provider]  calcium-vitamin D (OSCAL WITH D) 500-200 MG-UNIT per tablet Take 1 tablet by mouth.   Yes [provider]  dorzolamide (TRUSOPT) 2 % ophthalmic solution Place 1 drop into the left eye 2 (two) times daily. 08/03/20  Yes [provider]  fluocinonide ointment (LIDEX) 0.05 % Apply 1 Application topically 2 (two) times daily as needed (scalp irritation). 12/27/21  Yes [provider]  furosemide (LASIX) 20 MG tablet Take 1 tablet (20 mg total) by mouth daily. 01/28/21  Yes Hollice Espy, MD  gabapentin (NEURONTIN) 300 MG capsule Take 300 mg by mouth daily as needed (pain). 08/10/20  Yes [provider]  magnesium oxide (MAG-OX) 400 (241.3 Mg) MG tablet Take 400 mg by mouth in the morning and at bedtime. 07/08/20  Yes [provider]  metFORMIN (GLUCOPHAGE) 500 MG tablet Take 500 mg by mouth 2 (two) times daily. 08/24/20  Yes [provider]  metoprolol succinate (TOPROL-XL) 25 MG 24 hr tablet Take 12.5 mg by mouth daily.   Yes [provider]  omeprazole (PRILOSEC) 20 MG capsule Take 20 mg by mouth daily.   Yes [provider]  predniSONE (DELTASONE) 2.5 MG tablet Take 7.5 mg by mouth daily.   Yes [provider]  silver sulfADIAZINE (SILVADENE) 1 % cream Apply 1 Application topically daily as needed (scalp irritation).   Yes [provider]  cilostazol (PLETAL) 50 MG tablet Take 50 mg by mouth 2 (two) times daily. Patient not taking: Reported on 01/29/2022    [provider]  fluticasone (FLONASE) 50 MCG/ACT nasal spray Place 2 sprays into both nostrils daily as needed for allergies. 09/27/21   [provider]  MATZIM LA 240 MG 24 hr tablet Take 240 mg by mouth daily. Patient not taking: Reported on 01/29/2022 08/03/20   [provider]  metoprolol tartrate (LOPRESSOR) 25 MG tablet Take 1 tablet (25 mg total) by mouth 2 (two) times daily. Patient not taking: Reported on 01/29/2022 08/17/20   Rhetta Mura, MD  warfarin (COUMADIN) 2 MG tablet On Saturday and Sunday, take 2 tabs (4mg ), then resume normal regimen Take 1 tablet (2 mg) daily except  Take 2 tablets (4 mg) on (Tues & Fri) Patient taking differently: Take 2-4 mg by mouth daily. 4 mg on Sunday, Monday, Wednesday, Friday 2 mg on Tuesday, Thursday Saturday 01/28/21   Hollice EspyKrishnan, Sendil K, MD     Vital Signs: BP 106/65   Pulse 81   Temp (!) 96.3 F (35.7 C) (Axillary)   Resp 18   Ht 5\' 5"  (1.651 m)   Wt 158 lb 8.2 oz (71.9 kg)   SpO2 98%   BMI 26.38 kg/m   Physical Exam Vitals reviewed.  Constitutional:      General: She is not in  acute distress.    Appearance: She is not ill-appearing.  HENT:     Head: Normocephalic.  Pulmonary:     Effort: Pulmonary effort is normal.  Abdominal:     General: Abdomen is flat.  Skin:    General: Skin is warm and dry.     Coloration: Skin is not cyanotic or jaundiced.     Comments: Positive RUQ drain to a gravity bag. Site is unremarkable with no erythema, edema, tenderness, bleeding or drainage. Suture and stat lock in place. Dressing is clean, dry, and intact. Trace of  yellow colored fluid with debris  noted in the bag. Drain aspirates and flushes well.    Neurological:     Mental Status: She is alert and oriented to person, place, and time.  Psychiatric:        Behavior: Behavior normal.     Imaging: IR Perc Cholecystostomy  Result Date: 01/31/2022 INDICATION: 81 year old female with acute calculus cholecystitis, poor surgical candidate due to multiple comorbidities. EXAM: Fluoroscopic and ultrasound-guided percutaneous cholecystostomy tube placement MEDICATIONS: The patient was receiving intravenous antibiotics as an inpatient, administered in the appropriate time frame prior to the start of this procedure none. ANESTHESIA/SEDATION: Moderate (conscious) sedation was employed during this procedure. A total of Versed 0.5 mg and Fentanyl 25 mcg was administered intravenously. Moderate Sedation Time: 10 minutes. The patient's level of consciousness and vital signs were monitored continuously by radiology nursing throughout the procedure under my direct supervision. FLUOROSCOPY TIME:  Four mGy CONTRAST:  5 mL Omnipaque 300, into the gallbladder COMPLICATIONS: None immediate. PROCEDURE: Informed written consent was obtained from the patient after a thorough discussion of the procedural risks, benefits and alternatives. All questions were addressed. Maximal Sterile Barrier Technique was utilized including caps, mask, sterile gowns, sterile gloves, sterile drape, hand hygiene and skin  antiseptic. A timeout was performed prior to the initiation of the procedure. The patient was placed supine on the angiographic table. The patient's right upper quadrant was then prepped and draped in normal sterile fashion with maximum sterile barrier. Ultrasound demonstrates a distended gallbladder. Subdermal Local anesthesia was provided at the planned skin entry site. Under ultrasound guidance, deeper local anesthetic was provided through abdominal oblique muscles and along the surface of the gallbladder. Ultrasound was used to puncture the gallbladder using a 21 gauge Chiba needle via a transperitoneal/subhepatic approach with visualization of the lung treated to the gallbladder. A 0.018 inch wire was advanced into the lumen and a transition dilator placed. A gentle hand injection of contrast was performed. Cholecystogram demonstrates irregular filling defects consistent with gallstones. The cystic duct is obstructed. A 0.035 inch exchange wire was placed in the tract was dilated. A 10.2 French multipurpose drainage catheter was advanced into the gallbladder lumen. The drain was then secured in place using a 0-silk suture and a Stayfix device. A sterile dressing was applied. The  tube was placed to bulb suction. A culture was sent to the lab for analysis. The patient tolerated procedure well without evidence of immediate complication was transferred back to the floor in stable condition. IMPRESSION: Successful placement of percutaneous, transperitoneal/subhepatic cholecystostomy tube. If the patient is deemed a poor operative candidate indefinitely, the patient may be considered for choledochoscopic-assisted (Spyglass) percutaneous gallstone retrieval by IR for eventual tube removal. Marliss Coots, MD [facility] Electronically Signed   By: Marliss Coots M.D.   On: 01/31/2022 07:52   DG CHEST PORT 1 VIEW  Result Date: 01/29/2022 CLINICAL DATA:   81 year old female with shortness of breath. EXAM: PORTABLE CHEST 1 VIEW COMPARISON:  CT Abdomen and Pelvis 01/28/2022 and earlier. FINDINGS: Portable AP semi upright view at 0439 hours. Stable cardiomegaly and mediastinal contours. Levoconvex thoracic scoliosis. Mildly lower lung volumes. Allowing for portable technique the lungs are clear. No pneumothorax or pleural effusion. Visualized tracheal air column is within normal limits. No acute osseous abnormality identified. IMPRESSION: Stable cardiomegaly. No acute cardiopulmonary abnormality. Electronically Signed   By: Odessa Fleming M.D.   On: 01/29/2022 05:17   CT ABDOMEN PELVIS W CONTRAST  Result Date: 01/28/2022 CLINICAL DATA:  Acute nonlocalized abdominal pain. EXAM: CT ABDOMEN AND PELVIS WITH CONTRAST TECHNIQUE: Multidetector CT imaging of the abdomen and pelvis was performed using the standard protocol following bolus administration of intravenous contrast. RADIATION DOSE REDUCTION: This exam was performed according to the departmental dose-optimization program which includes automated exposure control, adjustment of the mA and/or kV according to patient size and/or use of iterative reconstruction technique. CONTRAST:  79mL OMNIPAQUE IOHEXOL 300 MG/ML  SOLN COMPARISON:  CT 01/20/2021 FINDINGS: Lower chest: Chronic cardiomegaly. Minor atelectasis in the lung bases without focal airspace disease or pleural effusion. Hepatobiliary: The gallbladder is distended. There is pericholecystic fat stranding and gallbladder wall thickening. Small focus of air in the gallbladder fundus likely within the gallbladder lumen. Layering density in the gallbladder may represent stones or sludge. Pneumobilia, likely related to prior ERCP. No abnormal distension of the intra or extrahepatic biliary tree. Diffuse hepatic steatosis with areas of focal fatty sparing adjacent to the gallbladder fossa. Pancreas: Parenchymal atrophy. No ductal dilatation or inflammation. Spleen: Normal in  size without focal abnormality. Adrenals/Urinary Tract: No adrenal nodule. Ptotic right kidney. Fluid adjacent to the right kidney may represent renal inflammation or be reactive related to adjacent gallbladder inflammation. There is homogeneous renal enhancement. No hydronephrosis or renal calculi. Simple cyst in the left kidney needs no further follow-up. Unremarkable urinary bladder. Stomach/Bowel: Tiny hiatal hernia. No abnormal gastric distension. Fluid in the distal stomach and the duodenum without wall thickening or inflammation. No small bowel obstruction or inflammation. Normal appendix. Left colonic diverticulosis, no diverticulitis or acute colonic inflammation. Vascular/Lymphatic: Advanced aortic and branch atherosclerosis. No aortic aneurysm. The SMA is densely calcified but patent. 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 Reconstitution of the common femoral artery. IVC filter in place. No bulky adenopathy. Reproductive: Normal for age uterine atrophy.  No adnexal mass. Other: Fat stranding and small amount of free fluid in the right upper quadrant and right pararenal space. No free air or focal fluid collection. Small fat containing umbilical hernia. Musculoskeletal: There are no acute or suspicious osseous abnormalities. 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 Lower lumbar facet hypertrophy IMPRESSION: 1. Findings highly suspicious for acute cholecystitis. Gallbladder wall thickening or pericholecystic fat stranding and inflammation. Layering  density in the gallbladder may represent stones or sludge. Pneumobilia is likely related to prior ERCP, no definite biliary dilatation. 2. Right perinephric fluid is felt to be reactive related to adjacent gallbladder inflammation, there are no other findings of right renal inflammation or obstruction. 3. Hepatic steatosis. 4. Left colonic diverticulosis without diverticulitis. Aortic  Atherosclerosis (ICD10-I70.0). Electronically Signed   By: Narda Rutherford M.D.   On: 01/28/2022 18:32   DG Chest 2 View  Result Date: 01/28/2022 CLINICAL DATA:  Pain and vomiting for 1 day. EXAM: CHEST - 2 VIEW COMPARISON:  01/11/2022 and prior radiographs FINDINGS: Cardiomediastinal silhouette is unchanged with cardiomegaly and tortuous thoracic aorta again noted. There is no evidence of focal airspace disease, pulmonary edema, suspicious pulmonary nodule/mass, pleural effusion, or pneumothorax. No acute bony abnormalities are identified. IVC filter within the abdomen again noted. IMPRESSION: Cardiomegaly without evidence of acute cardiopulmonary disease. Electronically Signed   By: Harmon Pier M.D.   On: 01/28/2022 14:07    Labs:  CBC: Recent Labs    01/29/22 0500 01/30/22 0313 01/31/22 0503 02/01/22 0243  WBC 25.0* 25.2* 15.8* 10.5  HGB 15.1* 13.8 12.2 11.5*  HCT 44.1 43.1 37.4 36.3  PLT 257 186 167 167    COAGS: Recent Labs    01/28/22 1909 01/29/22 0500 01/30/22 0750 02/01/22 0243  INR 2.7* 2.7* 1.9* 2.1*    BMP: Recent Labs    01/29/22 0500 01/30/22 0313 01/31/22 0503 02/01/22 0243  NA 132* 136 135 139  K 4.5 4.4 3.7 4.5  CL 99 104 106 111  CO2 24 20* 20* 20*  GLUCOSE 130* 103* 100* 112*  BUN 13 18 27* 22  CALCIUM 8.6* 8.8* 8.4* 9.0  CREATININE 0.88 1.18* 1.14* 0.91  GFRNONAA >60 47* 49* >60    LIVER FUNCTION TESTS: Recent Labs    07/14/21 1331 01/28/22 1525 01/29/22 0500 01/30/22 0313 02/01/22 0243  BILITOT 1.3* 1.6* 2.1* 2.2*  --   AST 41 18 16 15   --   ALT 20 17 16 14   --   ALKPHOS 93 75 79 71  --   PROT 6.9 7.2 7.7 7.6  --   ALBUMIN 3.3* 3.3* 3.4* 3.3* 2.7*    Assessment and Plan:  81 y.o. female with acute cholecystitis s/p perc chole placement by Dr. on 7/17.   WBC normal  Afebrile  Cx ENTEROBACTER AEROGENES  OP clear yellow with some debris   Drain Location: RUQ Size: Fr size: 10 Fr Date of placement: 7/17  Currently  to: Drain collection device: gravity 24 hour output:  Output by Drain (mL) 01/30/22 0701 - 01/30/22 1900 01/30/22 1901 - 01/31/22 0700 01/31/22 0701 - 01/31/22 1900 01/31/22 1901 - 02/01/22 0700 02/01/22 0701 - 02/01/22 0912  Biliary Tube Cook slip-coat 10.2 Fr. RUQ 100 30 45 45     Interval imaging/drain manipulation:  None   Current examination: Flushes/aspirates easily.  Insertion site unremarkable. Suture and stat lock in place. Dressed appropriately.   Plan: Continue TID flushes with 5 cc NS. Record output Q shift. Dressing changes QD or PRN if soiled.  Call IR APP or on call IR MD if difficulty flushing or sudden change in drain output.  Repeat imaging/possible drain injection once output < 10 mL/QD (excluding flush material). Consideration for drain removal if output is < 10 mL/QD (excluding flush material), pending discussion with the providing surgical service.  Discharge planning:  Outpatient f/u order placed Saline flush prescription sent to [facility] output pharmacy  Discussed drain care after d/c with the patient    Please contact IR APP or on call IR MD prior to patient d/c to ensure appropriate follow up plans are in place. Typically patient will follow up with IR clinic 6-8 weeks post d/c for repeat imaging/possible drain injection. IR scheduler will contact patient with date/time of appointment. Patient will need to flush drain QD with 5 cc NS, record output QD, dressing changes every 2-3 days or earlier if soiled.   IR will continue to follow - please call with questions or concerns.   Electronically Signed: Willette Brace, PA-C 02/01/2022, 9:11 AM   I spent a total of 15 Minutes at the the patient's bedside AND on the patient's hospital floor or unit, greater than 50% of which was counseling/coordinating care for perc chole f/u.  This chart was dictated using voice recognition software.  Despite best efforts to proofread,  errors can occur which can change the  documentation meaning.

## 2022-02-01 NOTE — Plan of Care (Signed)
  Problem: Education: Goal: Knowledge of General Education information will improve Description: Including pain rating scale, medication(s)/side effects and non-pharmacologic comfort measures Outcome: Progressing   Problem: Health Behavior/Discharge Planning: Goal: Ability to manage health-related needs will improve Outcome: Progressing   Problem: Clinical Measurements: Goal: Ability to maintain clinical measurements within normal limits will improve Outcome: Progressing Goal: Will remain free from infection Outcome: Progressing Goal: Diagnostic test results will improve Outcome: Progressing Goal: Respiratory complications will improve Outcome: Progressing Goal: Cardiovascular complication will be avoided Outcome: Progressing   Problem: Activity: Goal: Risk for activity intolerance will decrease Outcome: Progressing   Problem: Nutrition: Goal: Adequate nutrition will be maintained Outcome: Progressing   Problem: Coping: Goal: Level of anxiety will decrease Outcome: Progressing   Problem: Elimination: Goal: Will not experience complications related to bowel motility Outcome: Progressing Goal: Will not experience complications related to urinary retention Outcome: Progressing   Problem: Pain Managment: Goal: General experience of comfort will improve Outcome: Progressing   Problem: Safety: Goal: Ability to remain free from injury will improve Outcome: Progressing   Problem: Skin Integrity: Goal: Risk for impaired skin integrity will decrease Outcome: Progressing   Problem: Education: Goal: Ability to describe self-care measures that may prevent or decrease complications (Diabetes Survival Skills Education) will improve Outcome: Progressing Goal: Individualized Educational Video(s) Outcome: Progressing   

## 2022-02-01 NOTE — Evaluation (Signed)
Occupational Therapy Evaluation Patient Details Name: Jill Crane MRN: [id number] DOB: 1940-09-09 Today's Date: 02/01/2022   History of Present Illness Jill Crane is a 81 y.o. female with PMH significant for DM2, HTN, A-fib, CHF, COPD and a history of ERCP for choledocholithiasis.  Patient presented to the ED on 7/15 with complaint of right upper quadrant abdominal pain, nausea, vomiting, low sodium, liver enzymes slightly elevated, WC count elevated,  CT abdomen and pelvis showed gallbladder wall thickening,  gallstones, suggestive of acute cholecystitis. S/P percutaneous cholecystostomy tube placement on 7/17. Onset A flutter, started on Cardiazem drip.   Clinical Impression   Patient is a 81 year old female who was admitted for above. Patient reports living at home with daughter. Patient was noted to have poor safety awareness during session with appearing to have no awareness of R side drain with continued cues needed for safety. Patient was noted to have decreased functional activity tolerance, decreased endurance, decreased standing balance, decreased safety awareness, and decreased knowledge of AD/AE impacting participation in ADLs. Patient would continue to benefit from skilled OT services at this time while admitted and after d/c to address noted deficits in order to improve overall safety and independence in ADLs.          Recommendations for follow up therapy are one component of a multi-disciplinary discharge planning process, led by the attending physician.  Recommendations may be updated based on patient status, additional functional criteria and insurance authorization.   Follow Up Recommendations  Home health OT    Assistance Recommended at Discharge Frequent or constant Supervision/Assistance  Patient can return home with the following A little help with walking and/or transfers;A little help with bathing/dressing/bathroom;Assistance with cooking/housework;Direct  supervision/assist for medications management;Direct supervision/assist for financial management;Assist for transportation    Functional Status Assessment  Patient has had a recent decline in their functional status and demonstrates the ability to make significant improvements in function in a reasonable and predictable amount of time.  Equipment Recommendations  None recommended by OT    Recommendations for Other Services       Precautions / Restrictions Precautions Precautions: Fall Precaution Comments: right  drain, BP can be low, no awareness of drain Restrictions Weight Bearing Restrictions: No      Mobility Bed Mobility Overal bed mobility: Needs Assistance Bed Mobility: Rolling, Sidelying to Sit Rolling: Min assist         General bed mobility comments: cues for precautions to roll, assisted trunk to sitting upright.    Transfers                          Balance Overall balance assessment: Needs assistance Sitting-balance support: Bilateral upper extremity supported, Feet supported Sitting balance-Leahy Scale: Fair     Standing balance support: No upper extremity supported, Single extremity supported, During functional activity Standing balance-Leahy Scale: Poor                             ADL either performed or assessed with clinical judgement   ADL Overall ADL's : Needs assistance/impaired Eating/Feeding: Set up;Sitting   Grooming: Sitting;Set up;Min guard Grooming Details (indicate cue type and reason): for safety Upper Body Bathing: Min guard;Sitting Upper Body Bathing Details (indicate cue type and reason): with simulated tasks. patient has no awareness of drain even with continued education on location and cues for safety. Lower Body Bathing: Min guard;Minimal assistance;Sit to/from stand  Lower Body Bathing Details (indicate cue type and reason): patietn simulated touching bilateral knees with noted posterior leaning and  needing to sit back down. patient reported standing at home for bathing tasks. patient was educated on tub transfer bench. Upper Body Dressing : Min guard;Sitting   Lower Body Dressing: Sitting/lateral leans;Sit to/from stand;Minimal assistance Lower Body Dressing Details (indicate cue type and reason): with safety cues no awareness of line. poor standing balance. able to don/doff socks. Toilet Transfer: Minimal Dentist Details (indicate cue type and reason): safety Toileting- Clothing Manipulation and Hygiene: Minimal assistance;Sit to/from stand Toileting - Clothing Manipulation Details (indicate cue type and reason): incontinence episode in bed.             Vision Patient Visual Report: No change from baseline       Perception     Praxis      Pertinent Vitals/Pain Pain Assessment Pain Assessment: No/denies pain     Hand Dominance Right   Extremity/Trunk Assessment Upper Extremity Assessment Upper Extremity Assessment: Overall WFL for tasks assessed   Lower Extremity Assessment Lower Extremity Assessment: Defer to PT evaluation   Cervical / Trunk Assessment Cervical / Trunk Assessment: Normal   Communication Communication Communication: Other (comment) (speaks rapidly and at times sounds garbled)   Cognition Arousal/Alertness: Awake/alert Behavior During Therapy: WFL for tasks assessed/performed Overall Cognitive Status: Within Functional Limits for tasks assessed                                       General Comments       Exercises     Shoulder Instructions      Home Living Family/patient expects to be discharged to:: Private residence Living Arrangements: Other relatives;Other (Comment) Available Help at Discharge: Family;Available PRN/intermittently Type of Home: House Home Access: Level entry     Home Layout: One level     Bathroom Shower/Tub: Chief Strategy Officer: Standard     Home  Equipment: Agricultural consultant (2 wheels)   Additional Comments: patient  not clear about the Rw      Prior Functioning/Environment Prior Level of Function : Independent/Modified Independent             Mobility Comments: does not drive, used no DME ADLs Comments: independent        OT Problem List: Decreased strength;Decreased activity tolerance;Impaired balance (sitting and/or standing);Decreased safety awareness;Cardiopulmonary status limiting activity;Decreased knowledge of precautions;Decreased knowledge of use of DME or AE      OT Treatment/Interventions: Self-care/ADL training;Therapeutic exercise;Neuromuscular education;Energy conservation;DME and/or AE instruction;Therapeutic activities;Balance training;Patient/family education    OT Goals(Current goals can be found in the care plan section) Acute Rehab OT Goals Patient Stated Goal: to go home OT Goal Formulation: Patient unable to participate in goal setting Time For Goal Achievement: 02/15/22 Potential to Achieve Goals: Fair  OT Frequency: Min 2X/week    Co-evaluation              AM-PAC OT "6 Clicks" Daily Activity     Outcome Measure Help from another person eating meals?: A Little Help from another person taking care of personal grooming?: A Little Help from another person toileting, which includes using toliet, bedpan, or urinal?: A Lot Help from another person bathing (including washing, rinsing, drying)?: A Lot Help from another person to put on and taking off regular upper body clothing?: A Little Help from another person  to put on and taking off regular lower body clothing?: A Lot 6 Click Score: 15   End of Session Equipment Utilized During Treatment: Gait belt;Rolling walker (2 wheels) Nurse Communication: Mobility status  Activity Tolerance: Patient tolerated treatment well Patient left: in bed;with call bell/phone within reach;with bed alarm set  OT Visit Diagnosis: Unsteadiness on feet  (R26.81);Other abnormalities of gait and mobility (R26.89);Muscle weakness (generalized) (M62.81)                Time: 1350-1405 OT Time Calculation (min): 15 min Charges:  OT General Charges $OT Visit: 1 Visit OT Evaluation $OT Eval Moderate Complexity: 1 Mod  Sharyn Blitz OTR/L, MS Acute Rehabilitation Department Office# [phone] Pager# [phone]   Ardyth Harps 02/01/2022, 4:10 PM

## 2022-02-01 NOTE — Progress Notes (Signed)
PROGRESS NOTE  Jill Crane XKG:[id number] DOB: [birth year]   PCP: Zoila Shutter, MD  Patient is from: Home.  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  She was started on IV Zosyn.  General surgery and IR consulted.  She had percutaneous drain placed by IR on 7/17.   Hospital course complicated by A-fib with RVR requiring IV Cardizem.  Eventually, RVR resolved.  She was transitioned to p.o. Cardizem CD.  Therapy recommended home health.  Likely discharge on p.o. antibiotics on 7/20.  Subjective: Seen and examined earlier this morning.  No major events overnight of this morning.  Has no complaints.  Denies chest pain, dyspnea, GI or UTI symptoms.  Objective: Vitals:   02/01/22 1150 02/01/22 1200 02/01/22 1300 02/01/22 1400  BP:  (!) 109/59 135/84 99/73  Pulse:   70   Resp:  (!) 22 20 (!) 24  Temp: (!) 97.1 F (36.2 C)     TempSrc: Axillary     SpO2:  100% 98% 100%  Weight:      Height:        Examination:  GENERAL: No apparent distress.  Nontoxic. HEENT: MMM.  Vision and hearing grossly intact.  NECK: Supple.  No apparent JVD.  RESP:  No IWOB.  Fair aeration bilaterally. CVS: Irregular rhythm.  Normal rate.  Heart sounds normal.  ABD/GI/GU: BS+. Abd soft, NTND.  MSK/EXT:  Moves extremities. No apparent deformity. No edema.  SKIN: no apparent skin lesion or wound NEURO: Awake and alert. Oriented x4 except date and year.  Limited insight.  No apparent focal neuro deficit. PSYCH: Calm. Normal affect.   Procedures:  7/17-percutaneous cholecystostomy tube placement  Microbiology summarized: MRSA PCR screen positive. Biliary fluid  culture with pansensitive Proteus mirabilis except to Ancef.  Assessment and plan: Principal Problem:   Acute cholecystitis Active Problems:   Sepsis (HCC)   Diabetes (HCC)   Essential hypertension   Current chronic use of systemic steroids   Chronic diastolic CHF (congestive heart failure) (HCC)   Atrial fibrillation with RVR (HCC)   Hypotension   Bandemia   History of pemphigus  Sepsis secondary to acute cholecystitis -S/p percutaneous cholecystostomy on 7/17 -Fluid culture with Proteus mirabilis.  De-escalate antibiotic to IV ceftriaxone. -Possible lap chole about 6 weeks later -OOB/PT/OT -Continue soft diet.  Atrial flutter with RVR: RVR resolved.  INR 2.1. -Consolidate p.o. Cardizem to Cardizem CD 180 mg daily -P.o. Cardizem every 6 hours as needed -Warfarin per pharmacy. -Optimize electrolytes   Controlled NIDDM-2 with hyperglycemia and neuropathy: A1c 6.8%.  Seems to be on metformin at home. Recent Labs  Lab 01/31/22 0326 01/31/22 0722 01/31/22 1105 01/31/22 1519 01/31/22 2115  GLUCAP 126* 90 172* 182* 213*  -Increase SSI to moderate -Continue holding metformin. -Continue home gabapentin.  Chronic diastolic CHF: Stable.  Appears euvolemic on exam. Essential hypertension: Normotensive. -P.o. Cardizem as above -Continue holding home metoprolol   History of pemphigus  -on chronic steroids with prednisone 7.5 mg daily -May need to stress dose steroid   Peripheral vascular disease  -Per daughter, patient is no longer on aspirin or Pletal.   Anxiety/depression -Continue buspirone 10 mg  twice daily  Leukocytosis/bandemia: Likely due to #1 and steroid.  Resolved.  Body mass index is 26.38 kg/m.           DVT prophylaxis:  On full dose anticoagulation. warfarin (COUMADIN) tablet 4 mg  Code Status: Full code Family Communication: None at bedside Level of care: Telemetry Status is: Inpatient Remains inpatient appropriate because: A-fib with RVR,  sepsis due to acute cholecystitis and hypotension   Final disposition: Home with home meds once medically stable Consultants:  General surgery Interventional radiology  Sch Meds:  Scheduled Meds:  brimonidine  1 drop Left Eye Q8H   busPIRone  10 mg Oral BID   Chlorhexidine Gluconate Cloth  6 each Topical Daily   diltiazem  180 mg Oral Daily   dorzolamide  1 drop Both Eyes QHS   gabapentin  300 mg Oral QHS   insulin aspart  0-15 Units Subcutaneous TID WC   insulin aspart  0-5 Units Subcutaneous QHS   mupirocin ointment  1 Application Nasal BID   predniSONE  7.5 mg Oral Q breakfast   sodium chloride flush  5 mL Intracatheter Q8H   warfarin  4 mg Oral ONCE-1600   Warfarin - Pharmacist Dosing Inpatient   Does not apply q1600   Continuous Infusions:  sodium chloride Stopped (01/31/22 1250)   piperacillin-tazobactam (ZOSYN)  IV 3.375 g (02/01/22 1218)   PRN Meds:.sodium chloride, acetaminophen **OR** acetaminophen, albuterol, diltiazem, fluocinonide ointment, fluticasone, mouth rinse  Antimicrobials: Anti-infectives (From admission, onward)    Start     Dose/Rate Route Frequency Ordered Stop   01/29/22 0445  piperacillin-tazobactam (ZOSYN) IVPB 3.375 g        3.375 g 12.5 mL/hr over 240 Minutes Intravenous Every 8 hours 01/29/22 0354     01/28/22 1900  piperacillin-tazobactam (ZOSYN) IVPB 3.375 g        3.375 g 100 mL/hr over 30 Minutes Intravenous  Once 01/28/22 1846 01/28/22 1935        I have personally reviewed the following labs and images: CBC: Recent Labs  Lab 01/28/22 1525 01/29/22 0500 01/30/22 0313 01/31/22 0503 02/01/22 0243  WBC 19.7* 25.0* 25.2* 15.8* 10.5  NEUTROABS  --  20.8* 21.7* 13.6*  --   HGB 12.9 15.1* 13.8 12.2 11.5*  HCT 39.2 44.1 43.1 37.4 36.3  MCV 83.1 84.5 85.2 84.4 85.2  PLT 208 257 186 167 167   BMP &GFR Recent Labs  Lab 01/28/22 1525 01/29/22 0500 01/30/22 0313 01/31/22 0503 02/01/22 0243  NA 129* 132* 136 135 139  K 4.3  4.5 4.4 3.7 4.5  CL 99 99 104 106 111  CO2 22 24 20* 20* 20*  GLUCOSE 162* 130* 103* 100* 112*  BUN 13 13 18  27* 22  CREATININE 0.70 0.88 1.18* 1.14* 0.91  CALCIUM 8.3* 8.6* 8.8* 8.4* 9.0  MG  --   --   --  2.3 2.1  PHOS  --   --   --   --  2.2*   Estimated Creatinine Clearance: 49 mL/min (by C-G formula based on SCr of 0.91 mg/dL). Liver & Pancreas: Recent Labs  Lab 01/28/22 1525 01/29/22 0500 01/30/22 0313 02/01/22 0243  AST 18 16 15   --   ALT 17 16 14   --   ALKPHOS 75 79 71  --   BILITOT 1.6* 2.1* 2.2*  --   PROT 7.2 7.7 7.6  --   ALBUMIN 3.3* 3.4* 3.3* 2.7*   Recent Labs  Lab 01/28/22 1525  LIPASE 19  No results for input(s): "AMMONIA" in the last 168 hours. Diabetic: Recent Labs    01/31/22 0503  HGBA1C 6.8*   Recent Labs  Lab 01/31/22 0326 01/31/22 0722 01/31/22 1105 01/31/22 1519 01/31/22 2115  GLUCAP 126* 90 172* 182* 213*   Cardiac Enzymes: No results for input(s): "CKTOTAL", "CKMB", "CKMBINDEX", "TROPONINI" in the last 168 hours. No results for input(s): "PROBNP" in the last 8760 hours. Coagulation Profile: Recent Labs  Lab 01/28/22 1909 01/29/22 0500 01/30/22 0750 02/01/22 0243  INR 2.7* 2.7* 1.9* 2.1*   Thyroid Function Tests: Recent Labs    02/01/22 0243  TSH 1.952   Lipid Profile: No results for input(s): "CHOL", "HDL", "LDLCALC", "TRIG", "CHOLHDL", "LDLDIRECT" in the last 72 hours. Anemia Panel: No results for input(s): "VITAMINB12", "FOLATE", "FERRITIN", "TIBC", "IRON", "RETICCTPCT" in the last 72 hours. Urine analysis:    Component Value Date/Time   COLORURINE YELLOW 01/11/2022 1812   APPEARANCEUR CLEAR 01/11/2022 1812   LABSPEC 1.010 01/11/2022 1812   PHURINE 5.5 01/11/2022 1812   GLUCOSEU NEGATIVE 01/11/2022 1812   HGBUR TRACE (A) 01/11/2022 1812   BILIRUBINUR NEGATIVE 01/11/2022 1812   KETONESUR NEGATIVE 01/11/2022 1812   PROTEINUR NEGATIVE 01/11/2022 1812   UROBILINOGEN 1.0 08/16/2014 0423   NITRITE NEGATIVE  01/11/2022 1812   LEUKOCYTESUR TRACE (A) 01/11/2022 1812   Sepsis Labs: Invalid input(s): "PROCALCITONIN", "LACTICIDVEN"  Microbiology: Recent Results (from the past 240 hour(s))  MRSA Next Gen by PCR, Nasal     Status: Abnormal   Collection Time: 01/29/22  8:27 AM   Specimen: Nasal Mucosa; Nasal Swab  Result Value Ref Range Status   MRSA by PCR Next Gen DETECTED (A) NOT DETECTED Final    Comment: RESULT CALLED TO, READ BACK BY AND VERIFIED WITH: Stephan Minister RN AT 1423 01/29/22 BY TIBBITTS, K (NOTE) The GeneXpert MRSA Assay (FDA approved for NASAL specimens only), is one component of a comprehensive MRSA colonization surveillance program. It is not intended to diagnose MRSA infection nor to guide or monitor treatment for MRSA infections. Test performance is not FDA approved in patients less than 51 years old. Performed at [facility], [address], [place], [place] [address]   Aerobic/Anaerobic Culture w Gram Stain (surgical/deep wound)     Status: None (Preliminary result)   Collection Time: 01/30/22  5:14 PM   Specimen: Gallbladder; Abscess  Result Value Ref Range Status   Specimen Description   Final    GALL BLADDER Performed at [facility], [address] [place], [place] [address]    Special Requests   Final    NONE Performed at [facility], [address], [place], [place] [address]    Gram Stain NO WBC SEEN MODERATE GRAM NEGATIVE RODS   Final   Culture   Final    ABUNDANT ENTEROBACTER AEROGENES RARE PROTEUS MIRABILIS SUSCEPTIBILITIES TO FOLLOW HOLDING FOR POSSIBLE ANAEROBE Performed at [facility], [address] [place], [place] [address]    Report Status PENDING  Incomplete   Organism ID, Bacteria ENTEROBACTER AEROGENES  Final      Susceptibility   Enterobacter aerogenes - MIC*    CEFAZOLIN >=64 RESISTANT Resistant     CEFEPIME <=0.12 SENSITIVE Sensitive     CEFTAZIDIME <=1 SENSITIVE  Sensitive     CEFTRIAXONE <=0.25 SENSITIVE Sensitive     CIPROFLOXACIN <=0.25 SENSITIVE Sensitive     GENTAMICIN <=1 SENSITIVE Sensitive     IMIPENEM 1 SENSITIVE Sensitive     TRIMETH/SULFA <=  20 SENSITIVE Sensitive     PIP/TAZO <=4 SENSITIVE Sensitive     * ABUNDANT ENTEROBACTER AEROGENES    Radiology Studies: No results found.    Kelita Wallis T. [place]  If 7PM-7AM, please contact night-coverage [email or web address] 02/01/2022, 2:03 PM

## 2022-02-01 NOTE — Progress Notes (Signed)
Subjective/Chief Complaint: Comfortable this morning Tolerated po    Objective: Vital signs in last 24 hours: Temp:  [96.5 F (35.8 C)-98.3 F (36.8 C)] 96.5 F (35.8 C) (07/19 0404) Pulse Rate:  [39-90] 81 (07/19 0600) Resp:  [15-35] 17 (07/19 0600) BP: (80-132)/(40-79) 125/79 (07/19 0600) SpO2:  [96 %-100 %] 98 % (07/19 0600) FiO2 (%):  [21 %] 21 % (07/19 0400) Last BM Date : 01/28/22  Intake/Output from previous day: 07/18 0701 - 07/19 0700 In: 1089.2 [P.O.:420; I.V.:428.4; IV Piggyback:230.8] Out: 2515 [Urine:2425; Drains:90] Intake/Output this shift: No intake/output data recorded.  Exam: Awake and alert Abdomen soft, only mildly tender Drain with bile  Lab Results:  Recent Labs    01/31/22 0503 02/01/22 0243  WBC 15.8* 10.5  HGB 12.2 11.5*  HCT 37.4 36.3  PLT 167 167   BMET Recent Labs    01/31/22 0503 02/01/22 0243  NA 135 139  K 3.7 4.5  CL 106 111  CO2 20* 20*  GLUCOSE 100* 112*  BUN 27* 22  CREATININE 1.14* 0.91  CALCIUM 8.4* 9.0   PT/INR Recent Labs    01/30/22 0750 02/01/22 0243  LABPROT 21.2* 23.3*  INR 1.9* 2.1*   ABG No results for input(s): "PHART", "HCO3" in the last 72 hours.  Invalid input(s): "PCO2", "PO2"  Studies/Results: IR Perc Cholecystostomy  Result Date: 01/31/2022 INDICATION: 81 year old female with acute calculus cholecystitis, poor surgical candidate due to multiple comorbidities. EXAM: Fluoroscopic and ultrasound-guided percutaneous cholecystostomy tube placement MEDICATIONS: The patient was receiving intravenous antibiotics as an inpatient, administered in the appropriate time frame prior to the start of this procedure none. ANESTHESIA/SEDATION: Moderate (conscious) sedation was employed during this procedure. A total of Versed 0.5 mg and Fentanyl 25 mcg was administered intravenously. Moderate Sedation Time: 10 minutes. The patient's level of consciousness and vital signs were monitored continuously by  radiology nursing throughout the procedure under my direct supervision. FLUOROSCOPY TIME:  Four mGy CONTRAST:  5 mL Omnipaque 300, into the gallbladder COMPLICATIONS: None immediate. PROCEDURE: Informed written consent was obtained from the patient after a thorough discussion of the procedural risks, benefits and alternatives. All questions were addressed. Maximal Sterile Barrier Technique was utilized including caps, mask, sterile gowns, sterile gloves, sterile drape, hand hygiene and skin antiseptic. A timeout was performed prior to the initiation of the procedure. The patient was placed supine on the angiographic table. The patient's right upper quadrant was then prepped and draped in normal sterile fashion with maximum sterile barrier. Ultrasound demonstrates a distended gallbladder. Subdermal Local anesthesia was provided at the planned skin entry site. Under ultrasound guidance, deeper local anesthetic was provided through abdominal oblique muscles and along the surface of the gallbladder. Ultrasound was used to puncture the gallbladder using a 21 gauge Chiba needle via a transperitoneal/subhepatic approach with visualization of the lung treated to the gallbladder. A 0.018 inch wire was advanced into the lumen and a transition dilator placed. A gentle hand injection of contrast was performed. Cholecystogram demonstrates irregular filling defects consistent with gallstones. The cystic duct is obstructed. A 0.035 inch exchange wire was placed in the tract was dilated. A 10.2 French multipurpose drainage catheter was advanced into the gallbladder lumen. The drain was then secured in place using a 0-silk suture and a Stayfix device. A sterile dressing was applied. The tube was placed to bulb suction. A culture was sent to the lab for analysis. The patient tolerated procedure well without evidence of immediate complication was transferred back to the  floor in stable condition. IMPRESSION: Successful placement of  percutaneous, transperitoneal/subhepatic cholecystostomy tube. If the patient is deemed a poor operative candidate indefinitely, the patient may be considered for choledochoscopic-assisted (Spyglass) percutaneous gallstone retrieval by IR for eventual tube removal. Marliss Coots, MD [facility] Electronically Signed   By: Marliss Coots M.D.   On: 01/31/2022 07:52    Anti-infectives: Anti-infectives (From admission, onward)    Start     Dose/Rate Route Frequency Ordered Stop   01/29/22 0445  piperacillin-tazobactam (ZOSYN) IVPB 3.375 g        3.375 g 12.5 mL/hr over 240 Minutes Intravenous Every 8 hours 01/29/22 0354     01/28/22 1900  piperacillin-tazobactam (ZOSYN) IVPB 3.375 g        3.375 g 100 mL/hr over 30 Minutes Intravenous  Once 01/28/22 1846 01/28/22 1935       Assessment/Plan: Acute cholecystitis s/p percutaneous cholecystostomy tube placement by IR Multiple medical problems  From a surgical standpoint there is nothing further for [place] to offer right now so will sign off. She will need the perc chole tube in place approx 6 to 8 weeks and then she and the family will decide whether or not to have a cholecystectomy. Call us back if needed I will see her in the office in about 4 weeks   Abigail Miyamoto 02/01/2022

## 2022-02-01 NOTE — Progress Notes (Signed)
ANTICOAGULATION CONSULT NOTE - Follow Up Consult  Pharmacy Consult for Coumadin Indication: atrial fibrillation  No Known Allergies  Patient Measurements: Height: 5\' 5"  (165.1 cm) Weight: 71.9 kg (158 lb 8.2 oz) IBW/kg (Calculated) : 57  Vital Signs: Temp: 96.5 F (35.8 C) (07/19 0404) Temp Source: Axillary (07/19 0404) BP: 142/73 (07/19 0700) Pulse Rate: 81 (07/19 0600)  Labs: Recent Labs    01/30/22 0313 01/30/22 0313 01/30/22 0750 01/31/22 0503 01/31/22 1340 01/31/22 2235 02/01/22 0243  HGB 13.8  --   --  12.2  --   --  11.5*  HCT 43.1  --   --  37.4  --   --  36.3  PLT 186  --   --  167  --   --  167  LABPROT  --   --  21.2*  --   --   --  23.3*  INR  --   --  1.9*  --   --   --  2.1*  HEPARINUNFRC  --    < >  --  0.18* 0.33 0.47 0.52  CREATININE 1.18*  --   --  1.14*  --   --  0.91   < > = values in this interval not displayed.    Estimated Creatinine Clearance: 49 mL/min (by C-G formula based on SCr of 0.91 mg/dL).   Assessment: AC/Heme: warf PTA for Afib, SCDs, Hgb WNL INR 2.7>1.9. Started IV heparin 7/17 PM. Warf resumed 7/18 (LD unknown PTA) - HL 0.52 in goal, INR 2.1, Hgb 11.5, Plts 157  - PTA warfarin dose: 4mg  MWFSun, 2mg  TTS  Goal of Therapy:  INR 2-3 Monitor platelets by anticoagulation protocol: Yes   Plan:  D/c IV heparin with INR>2 Coumadin 4mg  again today Daily INR   Shevawn Langenberg S. 8/18, PharmD, BCPS Clinical Staff Pharmacist [email or web address] , Derrian Rodak Stillinger 02/01/2022,7:41 AM

## 2022-02-01 NOTE — Progress Notes (Signed)
ANTICOAGULATION CONSULT NOTE - Follow Up Consult  Pharmacy Consult for heparin + warfarin Indication: atrial fibrillation  No Known Allergies  Patient Measurements: Height: 5\' 5"  (165.1 cm) Weight: 71.9 kg (158 lb 8.2 oz) IBW/kg (Calculated) : 57 Heparin Dosing Weight: 71 kg  Vital Signs: Temp: 98.3 F (36.8 C) (07/18 2012) Temp Source: Oral (07/18 2012) BP: 120/70 (07/19 0000) Pulse Rate: 85 (07/19 0000)  Labs: Recent Labs    01/29/22 0500 01/30/22 0313 01/30/22 0750 01/31/22 0503 01/31/22 1340 01/31/22 2235  HGB 15.1* 13.8  --  12.2  --   --   HCT 44.1 43.1  --  37.4  --   --   PLT 257 186  --  167  --   --   LABPROT 28.5*  --  21.2*  --   --   --   INR 2.7*  --  1.9*  --   --   --   HEPARINUNFRC  --   --   --  0.18* 0.33 0.47  CREATININE 0.88 1.18*  --  1.14*  --   --   TROPONINIHS 11  --   --   --   --   --      Estimated Creatinine Clearance: 39.1 mL/min (A) (by C-G formula based on SCr of 1.14 mg/dL (H)).   Medical History: Past Medical History:  Diagnosis Date   Atrial fibrillation with normal ventricular rate (HCC)    CHF (congestive heart failure) (HCC)    COPD (chronic obstructive pulmonary disease) (HCC)    Diabetes mellitus without complication (HCC)    Hx of blood clots    Hypertension       Assessment: 81 year old female with history of afib on warfarin PTA which has been held for procedure. Home dose of warfarin 4 mg Sun, Mon, Wed, Fri and 2 mg Tues, Thurs, Sat. Presented with right upper quadrant pain; CT abdomen/pelvis suggestive of acute cholecystitis. Patient is s/p IR placement of percutaneous cholecystectomy tube. Pharmacy consulted to manage heparin.   Consult to resume warfarin 7/18.  02/01/22 Heparin level = 0.33, therapeutic with heparin at 1150 units/hr Hgb = 12.2, Pltc 167k No complications of therapy noted; no disruption of heparin infusion noted Warfarin to resume today  PM update: Heparin level = 0.47 (therapeutic) with  heparin gtt @ 1150 units/hr No complications of therapy noted  Goal of Therapy:  Heparin level 0.3-0.7 units/ml Monitor platelets by anticoagulation protocol: Yes   Plan:  -Continue heparin infusion at 1150 units/hr - Previously ordered Warfarin dose given this evening -Monitor daily Heparin level, INR, CBC, signs of bleeding   02/03/22, PharmD 02/01/2022 12:17 AM

## 2022-02-02 ENCOUNTER — Other Ambulatory Visit (HOSPITAL_COMMUNITY): Payer: Self-pay

## 2022-02-02 DIAGNOSIS — K810 Acute cholecystitis: Secondary | ICD-10-CM | POA: Diagnosis not present

## 2022-02-02 DIAGNOSIS — I10 Essential (primary) hypertension: Secondary | ICD-10-CM | POA: Diagnosis not present

## 2022-02-02 DIAGNOSIS — Z872 Personal history of diseases of the skin and subcutaneous tissue: Secondary | ICD-10-CM | POA: Diagnosis not present

## 2022-02-02 DIAGNOSIS — A4151 Sepsis due to Escherichia coli [E. coli]: Secondary | ICD-10-CM | POA: Diagnosis not present

## 2022-02-02 LAB — GLUCOSE, CAPILLARY
Glucose-Capillary: 146 mg/dL — ABNORMAL HIGH (ref 70–99)
Glucose-Capillary: 124 mg/dL — ABNORMAL HIGH (ref 70–99)
Glucose-Capillary: 162 mg/dL — ABNORMAL HIGH (ref 70–99)
Glucose-Capillary: 143 mg/dL — ABNORMAL HIGH (ref 70–99)
Glucose-Capillary: 107 mg/dL — ABNORMAL HIGH (ref 70–99)
Glucose-Capillary: 139 mg/dL — ABNORMAL HIGH (ref 70–99)

## 2022-02-02 LAB — AEROBIC/ANAEROBIC CULTURE W GRAM STAIN (SURGICAL/DEEP WOUND): Gram Stain: NONE SEEN

## 2022-02-02 LAB — PROTIME-INR
INR: 2.5 — ABNORMAL HIGH (ref 0.8–1.2)
Prothrombin Time: 27 seconds — ABNORMAL HIGH (ref 11.4–15.2)

## 2022-02-02 MED ORDER — METRONIDAZOLE 500 MG PO TABS
500.0000 mg | ORAL_TABLET | Freq: Two times a day (BID) | ORAL | 0 refills | Status: AC
  Filled 2022-02-02: qty 12, 6d supply, fill #0

## 2022-02-02 MED ORDER — CIPROFLOXACIN HCL 500 MG PO TABS
500.0000 mg | ORAL_TABLET | Freq: Two times a day (BID) | ORAL | 0 refills | Status: AC
  Filled 2022-02-02: qty 12, 6d supply, fill #0

## 2022-02-02 MED ORDER — ALBUTEROL SULFATE HFA 108 (90 BASE) MCG/ACT IN AERS: 2.0000 | INHALATION_SPRAY | Freq: Four times a day (QID) | RESPIRATORY_TRACT | Status: AC | PRN

## 2022-02-02 MED ORDER — CIPROFLOXACIN HCL 500 MG PO TABS: 500.0000 mg | ORAL_TABLET | Freq: Two times a day (BID) | ORAL | 0 refills | Status: DC

## 2022-02-02 MED ORDER — CEFDINIR 300 MG PO CAPS
300.0000 mg | ORAL_CAPSULE | Freq: Two times a day (BID) | ORAL | 0 refills | Status: DC
  Filled 2022-02-02: qty 16, 8d supply, fill #0

## 2022-02-02 MED ORDER — METRONIDAZOLE 500 MG PO TABS: 500.0000 mg | ORAL_TABLET | Freq: Two times a day (BID) | ORAL | 0 refills | Status: DC

## 2022-02-02 NOTE — Progress Notes (Signed)
AVS instructions reviewed with patient and her daughter, Liborio Nixon. Education and care plan completed. PIV removed. All patient belonging returned. Patient was transported to main lobby by NT via wheelchair. Patient was taken home by her daughter.

## 2022-02-02 NOTE — TOC Transition Note (Addendum)
Transition of Care [facility]) - CM/SW Discharge Note   Patient Details  Name: Jill Crane MRN: [id number] Date of Birth: May 19, 1941  [facility] CM/SW Contact:  Amada Jupiter, LCSW Phone Number: 02/02/2022, 11:11 AM   Clinical Narrative:    Pt medically cleared for dc home today and HHRN/PT/OT services have been ordered.  Pt aware and agreeable without any agency preference.  Able to secure services via [facility].  Info placed on AVS.  No further TOC needs.  RW ordered via [facility] for delivery to room.   Final next level of care: Home w Home Health Services Barriers to Discharge: No Barriers Identified   Patient Goals and CMS Choice Patient states their goals for this hospitalization and ongoing recovery are:: return home      Discharge Placement                       Discharge Plan and Services                DME Arranged: Walker rolling DME Agency: [facility] Date DME Agency Contacted: 02/02/22 Time DME Agency Contacted: [phone] Representative spoke with at DME Agency: Duwayne Heck HH Arranged: RN, PT, OT [facility] Agency: [facility] Date HH Agency Contacted: 02/02/22 Time HH Agency Contacted: [phone] Representative spoke with at [facility] Agency: Duwaine Maxin  Social Determinants of Health (SDOH) Interventions     Readmission Risk Interventions    02/02/2022   10:51 AM  Readmission Risk Prevention Plan  Post Dischage Appt Complete  Medication Screening Complete  Transportation Screening Complete

## 2022-02-02 NOTE — Discharge Summary (Addendum)
Physician Discharge Summary  Jill Crane KVQ:[id number] DOB: May 03, 1941 DOA: 01/28/2022  PCP: Zoila Shutter, MD  Admit date: 01/28/2022 Discharge date: 02/02/2022 Admitted From: Home Disposition: Home Recommendations for Outpatient Follow-up:  Follow up with PCP in 4 days  Check INR, CBC and CMP in 4 days Outpatient follow-up with general surgery and interventional radiology as below Please follow up on the following pending results: None  Home Health: PT/OT/RN Equipment/Devices: Rolling walker  Discharge Condition: Stable CODE STATUS: Full code  Follow-up Information     Abigail Miyamoto, MD. Go on 02/27/2022.   Specialty: General Surgery Why: 2:20 PM. Please arrive 30 min prior to appointment time. Bring ID and insurance information with you. Contact information: [address] [place] [address] [phone]         Bennie Dallas, MD Follow up.   Specialties: Interventional Radiology, Diagnostic Radiology, Radiology Why: Follow up drain injection in 6-8 weeks after discharge. Our scheduler will call you to set up the appointment. Contact information: [address] [place] [address] [phone]         Zoila Shutter, MD. Schedule an appointment as soon as possible for a visit in 4 day(s).   Specialty: Internal Medicine Contact information: [address] [place] [address] [phone]         [facility] Follow up.   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  She was started on IV Zosyn.  General surgery and IR consulted.  She had  percutaneous drain placed by IR on 7/17.    Hospital course complicated by A-fib with RVR requiring IV Cardizem.  Eventually, RVR resolved.  She was transitioned to p.o. Cardizem CD and remained stable..   Biliary fluid culture grew abundant Enterobacter aerogenes and Proteus mirabilis.  Gram stain also shows few bowel treatments.  She received IV Zosyn for 4 days in house.  Patient is discharged on ciprofloxacin and Flagyl for 6 more days.  Needs close follow-up on INR.  Also recommend repeat CBC and CMP at follow-up.  Home health PT/OT and rolling walker ordered as recommended by therapy.  Also ordered home health RN for help with drain management and medication compliance.  Outpatient follow-up as above.  See individual problem list below for more.   Problems addressed during this hospitalization Principal Problem:   Acute cholecystitis Active Problems:   Sepsis (HCC)   Diabetes (HCC)   Essential hypertension   Current chronic use of systemic steroids   Chronic diastolic CHF (congestive heart failure) (HCC)   Atrial fibrillation with RVR (HCC)   Hypotension   Bandemia   History of pemphigus   Sepsis secondary to acute cholecystitis -S/p percutaneous cholecystostomy on 7/17 -Fluid culture with Proteus mirabilis and Enterobacter aerogenes.  Gram stain also shows Bacteroides. -IV Zosyn 7/15-7/19>> ceftriaxone 7/19-7/20>> p.o Cipro and Flagyl for 6 more days -Patient to continue flushing drain with 5 cc NS every 8 hours -Possible lap chole about 6 weeks later -Outpatient follow-up with general surgery and IR as above. Addendum -Patient was initially discharged on p.o. cefdinir.  Antibiotics  changed to Cipro and Flagyl based on biliary culture update.  Called and notified patient's daughter.  They have not picked p.o. cefdinir yet.  Advised daughter to call PCP office for INR check early next week.   Atrial flutter with RVR: RVR resolved.  INR 2.3.  -Discharged on home CCB and  BB. -Continue home warfarin. -Check INR early next week.   Controlled NIDDM-2 with hyperglycemia and neuropathy: A1c 6.8%.  Seems to be on metformin at home. -Discharged on home medications.   Chronic diastolic CHF: Stable.  Appears euvolemic on exam. Essential hypertension: Normotensive. -Discharged on home medications.   History of pemphigus  -Discharged on home prednisone.   Peripheral vascular disease  -Per daughter, patient is no longer on aspirin or Pletal.   Anxiety/depression -Continue buspirone 10 mg twice daily   Leukocytosis/bandemia: Likely due to #1 and steroid.  Resolved.            Vital signs Vitals:   02/02/22 1200 02/02/22 1220 02/02/22 1300 02/02/22 1400  BP:  113/69 100/71 117/64  Pulse:   82   Temp:  97.7 F (36.5 C)    Resp: 13  (!) 21 (!) 24  Height:      Weight:      SpO2: 100%  99% 99%  TempSrc:  Oral    BMI (Calculated):         Discharge exam  GENERAL: No apparent distress.  Nontoxic. HEENT: MMM.  Vision and hearing grossly intact.  NECK: Supple.  No apparent JVD.  RESP:  No IWOB.  Fair aeration bilaterally. CVS:  RRR. Heart sounds normal.  ABD/GI/GU: BS+. Abd soft, NTND.  RUQ drain in place. MSK/EXT:  Moves extremities. No apparent deformity. No edema.  SKIN: no apparent skin lesion or wound NEURO: Awake and alert. Oriented appropriately.  No apparent focal neuro deficit. PSYCH: Calm. Normal affect.   Discharge Instructions Discharge Instructions     Call MD for:  difficulty breathing, headache or visual disturbances   Complete by: As directed    Call MD for:  extreme fatigue   Complete by: As directed    Call MD for:  persistant dizziness or light-headedness   Complete by: As directed    Call MD for:  persistant nausea and vomiting   Complete by: As directed    Call MD for:  severe uncontrolled pain   Complete by: As directed    Call MD for:  temperature >100.4   Complete by: As directed    Diet - low sodium heart  healthy   Complete by: As directed    Diet Carb Modified   Complete by: As directed    Discharge instructions   Complete by: As directed    It has been a pleasure taking care of you!  You were hospitalized due to gallbladder infection for which you have been treated with drain placement and IV antibiotics.  We are discharging you more antibiotics to continue taking.  Continue drain management per recommendation by interventional radiology.  Interventional radiology will arrange outpatient follow-up to reassess the drain.  Also recommend follow-up with general surgery per their recommendation.  Please review your new medication list and the directions on your medications before you take them.    Take care,   Discharge wound care:   Complete by: As directed    Continue flushes with 5 cc normal saline 3 times a day   Increase activity slowly   Complete by: As directed  Allergies as of 02/02/2022   No Known Allergies      Medication List     STOP taking these medications    metoprolol tartrate 25 MG tablet Commonly known as: LOPRESSOR       TAKE these medications    acetaminophen 500 MG tablet Commonly known as: TYLENOL Take 500 mg by mouth every 6 (six) hours as needed for mild pain.   albuterol 108 (90 Base) MCG/ACT inhaler Commonly known as: VENTOLIN HFA Inhale 2 puffs into the lungs every 6 (six) hours as needed for wheezing or shortness of breath.   alendronate 35 MG tablet Commonly known as: FOSAMAX Take 35 mg by mouth every Monday. Take with a full glass of water on an empty stomach.   bimatoprost 0.03 % ophthalmic solution Commonly known as: LUMIGAN 1 drop at bedtime.   brimonidine 0.2 % ophthalmic solution Commonly known as: ALPHAGAN Place 1 drop into the left eye in the morning and at bedtime.   busPIRone 10 MG tablet Commonly known as: BUSPAR Take 10 mg by mouth 2 (two) times daily.   calcium-vitamin D 500-200 MG-UNIT tablet Commonly known  as: OSCAL WITH D Take 1 tablet by mouth.   cilostazol 50 MG tablet Commonly known as: PLETAL Take 50 mg by mouth 2 (two) times daily.   ciprofloxacin 500 MG tablet Commonly known as: Cipro Take 1 tablet (500 mg total) by mouth 2 (two) times daily for 6 days. Stop Cefdinir   dorzolamide 2 % ophthalmic solution Commonly known as: TRUSOPT Place 1 drop into the left eye 2 (two) times daily.   fluocinonide ointment 0.05 % Commonly known as: LIDEX Apply 1 Application topically 2 (two) times daily as needed (scalp irritation).   fluticasone 50 MCG/ACT nasal spray Commonly known as: FLONASE Place 2 sprays into both nostrils daily as needed for allergies.   furosemide 20 MG tablet Commonly known as: LASIX Take 1 tablet (20 mg total) by mouth daily.   gabapentin 300 MG capsule Commonly known as: NEURONTIN Take 300 mg by mouth daily as needed (pain).   magnesium oxide 400 (241.3 Mg) MG tablet Commonly known as: MAG-OX Take 400 mg by mouth in the morning and at bedtime.   Matzim LA 240 MG 24 hr tablet Generic drug: diltiazem Take 240 mg by mouth daily.   metFORMIN 500 MG tablet Commonly known as: GLUCOPHAGE Take 500 mg by mouth 2 (two) times daily.   metoprolol succinate 25 MG 24 hr tablet Commonly known as: TOPROL-XL Take 12.5 mg by mouth daily.   metroNIDAZOLE 500 MG tablet Commonly known as: Flagyl Take 1 tablet (500 mg total) by mouth 2 (two) times daily for 6 days. Stop Cefdinir   omeprazole 20 MG capsule Commonly known as: PRILOSEC Take 20 mg by mouth daily.   predniSONE 2.5 MG tablet Commonly known as: DELTASONE Take 7.5 mg by mouth daily.   silver sulfADIAZINE 1 % cream Commonly known as: SILVADENE Apply 1 Application topically daily as needed (scalp irritation).   sodium chloride flush 0.9 % Soln Commonly known as: NS Inject 5 mLs by Intracatheter route daily.   warfarin 2 MG tablet Commonly known as: COUMADIN On Saturday and Sunday, take 2 tabs  ( ), then resume normal regimen Take 1 tablet (2 mg) daily except Take 2 tablets (4 mg) on (Tues & Fri) What changed:  how much to take how to take this when to take this additional instructions  Durable Medical Equipment  (From admission, onward)           Start     Ordered   02/02/22 1122  For home use only DME Walker rolling  Once       Question Answer Comment  Walker: With 5 Inch Wheels   Patient needs a walker to treat with the following condition Unsteady gait      02/02/22 1122              Discharge Care Instructions  (From admission, onward)           Start     Ordered   02/02/22 0000  Discharge wound care:       Comments: Continue flushes with 5 cc normal saline 3 times a day   02/02/22 6045            Consultations: General surgery Interventional radiology  Procedures/Studies: Biliary drain placement on 01/30/2022   IR Perc Cholecystostomy  Result Date: 01/31/2022 INDICATION: 81 year old female with acute calculus cholecystitis, poor surgical candidate due to multiple comorbidities. EXAM: Fluoroscopic and ultrasound-guided percutaneous cholecystostomy tube placement MEDICATIONS: The patient was receiving intravenous antibiotics as an inpatient, administered in the appropriate time frame prior to the start of this procedure none. ANESTHESIA/SEDATION: Moderate (conscious) sedation was employed during this procedure. A total of Versed 0.5 mg and Fentanyl 25 mcg was administered intravenously. Moderate Sedation Time: 10 minutes. The patient's level of consciousness and vital signs were monitored continuously by radiology nursing throughout the procedure under my direct supervision. FLUOROSCOPY TIME:  Four mGy CONTRAST:  5 mL Omnipaque 300, into the gallbladder COMPLICATIONS: None immediate. PROCEDURE: Informed written consent was obtained from the patient after a thorough discussion of the procedural risks, benefits and  alternatives. All questions were addressed. Maximal Sterile Barrier Technique was utilized including caps, mask, sterile gowns, sterile gloves, sterile drape, hand hygiene and skin antiseptic. A timeout was performed prior to the initiation of the procedure. The patient was placed supine on the angiographic table. The patient's right upper quadrant was then prepped and draped in normal sterile fashion with maximum sterile barrier. Ultrasound demonstrates a distended gallbladder. Subdermal Local anesthesia was provided at the planned skin entry site. Under ultrasound guidance, deeper local anesthetic was provided through abdominal oblique muscles and along the surface of the gallbladder. Ultrasound was used to puncture the gallbladder using a 21 gauge Chiba needle via a transperitoneal/subhepatic approach with visualization of the lung treated to the gallbladder. A 0.018 inch wire was advanced into the lumen and a transition dilator placed. A gentle hand injection of contrast was performed. Cholecystogram demonstrates irregular filling defects consistent with gallstones. The cystic duct is obstructed. A 0.035 inch exchange wire was placed in the tract was dilated. A 10.2 French multipurpose drainage catheter was advanced into the gallbladder lumen. The drain was then secured in place using a 0-silk suture and a Stayfix device. A sterile dressing was applied. The tube was placed to bulb suction. A culture was sent to the lab for analysis. The patient tolerated procedure well without evidence of immediate complication was transferred back to the floor in stable condition. IMPRESSION: Successful placement of percutaneous, transperitoneal/subhepatic cholecystostomy tube. If the patient is deemed a poor operative candidate indefinitely, the patient may be considered for choledochoscopic-assisted (Spyglass) percutaneous gallstone retrieval by IR for eventual tube removal. Marliss Coots, MD [facility] Radiology Electronically Signed   By: Marliss Coots M.D.   On: 01/31/2022 07:52  DG CHEST PORT 1 VIEW  Result Date: 01/29/2022 CLINICAL DATA:  81 year old female with shortness of breath. EXAM: PORTABLE CHEST 1 VIEW COMPARISON:  CT Abdomen and Pelvis 01/28/2022 and earlier. FINDINGS: Portable AP semi upright view at 0439 hours. Stable cardiomegaly and mediastinal contours. Levoconvex thoracic scoliosis. Mildly lower lung volumes. Allowing for portable technique the lungs are clear. No pneumothorax or pleural effusion. Visualized tracheal air column is within normal limits. No acute osseous abnormality identified. IMPRESSION: Stable cardiomegaly. No acute cardiopulmonary abnormality. Electronically Signed   By: Odessa Fleming M.D.   On: 01/29/2022 05:17   CT ABDOMEN PELVIS W CONTRAST  Result Date: 01/28/2022 CLINICAL DATA:  Acute nonlocalized abdominal pain. EXAM: CT ABDOMEN AND PELVIS WITH CONTRAST TECHNIQUE: Multidetector CT imaging of the abdomen and pelvis was performed using the standard protocol following bolus administration of intravenous contrast. RADIATION DOSE REDUCTION: This exam was performed according to the departmental dose-optimization program which includes automated exposure control, adjustment of the mA and/or kV according to patient size and/or use of iterative reconstruction technique. CONTRAST:  80mL OMNIPAQUE IOHEXOL 300 MG/ML  SOLN COMPARISON:  CT 01/20/2021 FINDINGS: Lower chest: Chronic cardiomegaly. Minor atelectasis in the lung bases without focal airspace disease or pleural effusion. Hepatobiliary: The gallbladder is distended. There is pericholecystic fat stranding and gallbladder wall thickening. Small focus of air in the gallbladder fundus likely within the gallbladder lumen. Layering density in the gallbladder may represent stones or sludge. Pneumobilia, likely related to prior ERCP. No abnormal distension of the intra or extrahepatic biliary tree.  Diffuse hepatic steatosis with areas of focal fatty sparing adjacent to the gallbladder fossa. Pancreas: Parenchymal atrophy. No ductal dilatation or inflammation. Spleen: Normal in size without focal abnormality. Adrenals/Urinary Tract: No adrenal nodule. Ptotic right kidney. Fluid adjacent to the right kidney may represent renal inflammation or be reactive related to adjacent gallbladder inflammation. There is homogeneous renal enhancement. No hydronephrosis or renal calculi. Simple cyst in the left kidney needs no further follow-up. Unremarkable urinary bladder. Stomach/Bowel: Tiny hiatal hernia. No abnormal gastric distension. Fluid in the distal stomach and the duodenum without wall thickening or inflammation. No small bowel obstruction or inflammation. Normal appendix. Left colonic diverticulosis, no diverticulitis or acute colonic inflammation. Vascular/Lymphatic: Advanced aortic and branch atherosclerosis. No aortic aneurysm. The SMA is densely calcified but patent. 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 Reconstitution of the common femoral artery. IVC filter in place. No bulky adenopathy. Reproductive: Normal for age uterine atrophy.  No adnexal mass. Other: Fat stranding and small amount of free fluid in the right upper quadrant and right pararenal space. No free air or focal fluid collection. Small fat containing umbilical hernia. Musculoskeletal: There are no acute or suspicious osseous abnormalities. 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 Lower lumbar facet hypertrophy IMPRESSION: 1. Findings highly suspicious for acute cholecystitis. Gallbladder wall thickening or pericholecystic fat stranding and inflammation. Layering density in the gallbladder may represent stones or sludge. Pneumobilia is likely related to prior ERCP, no definite biliary dilatation. 2. Right perinephric fluid is felt to be reactive related to adjacent  gallbladder inflammation, there are no other findings of right renal inflammation or obstruction. 3. Hepatic steatosis. 4. Left colonic diverticulosis without diverticulitis. Aortic Atherosclerosis (ICD10-I70.0). Electronically Signed   By: Narda Rutherford M.D.   On: 01/28/2022 18:32   DG Chest 2 View  Result Date: 01/28/2022 CLINICAL DATA:  Pain and vomiting for 1 day. EXAM: CHEST - 2 VIEW  COMPARISON:  01/11/2022 and prior radiographs FINDINGS: Cardiomediastinal silhouette is unchanged with cardiomegaly and tortuous thoracic aorta again noted. There is no evidence of focal airspace disease, pulmonary edema, suspicious pulmonary nodule/mass, pleural effusion, or pneumothorax. No acute bony abnormalities are identified. IVC filter within the abdomen again noted. IMPRESSION: Cardiomegaly without evidence of acute cardiopulmonary disease. Electronically Signed   By: Harmon Pier M.D.   On: 01/28/2022 14:07   MR BRAIN WO CONTRAST  Result Date: 01/12/2022 CLINICAL DATA:  Initial evaluation for acute dizziness. EXAM: MRI HEAD WITHOUT CONTRAST TECHNIQUE: Multiplanar, multiecho pulse sequences of the brain and surrounding structures were obtained without intravenous contrast. COMPARISON:  Prior CT from 01/11/2022. FINDINGS: Brain: Generalized age-related cerebral atrophy. Patchy and confluent T2/FLAIR hyperintensity involving the periventricular and deep white matter both cerebral hemispheres as well as the pons, consistent with chronic small vessel ischemic disease, moderate in nature. 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 Few small remote cerebellar infarcts noted, right greater than left. No abnormal foci of restricted diffusion to suggest acute or subacute ischemia. Gray-white matter differentiation otherwise maintained. No acute or chronic intracranial blood products. Incidental note made of a tiny subcentimeter lipoma at the right lateral margin of the medulla (series 11, image 11). No other  mass lesion, mass effect or midline shift. No hydrocephalus or extra-axial fluid collection. Pituitary gland suprasellar region within normal limits. Vascular: Left V4 segment hypoplastic and not well seen. Major intracranial vascular flow voids are otherwise maintained. Skull and upper cervical spine: Craniocervical junction within normal limits. Bone marrow signal intensity within normal limits. No scalp soft tissue abnormality. Sinuses/Orbits: Globes orbital soft tissues demonstrate no acute finding. Scattered mucosal thickening noted throughout the paranasal sinuses. No significant mastoid effusion. Other: None. IMPRESSION: 1. No acute intracranial abnormality. 2. Age-related cerebral atrophy with moderate chronic small vessel ischemic disease, with a few scattered remote infarcts involving the right thalamus and right greater than left cerebellum. Electronically Signed   By: Rise Mu M.D.   On: 01/12/2022 03:44   CT Head Wo Contrast  Result Date: 01/11/2022 CLINICAL DATA:  Dizziness EXAM: CT HEAD WITHOUT CONTRAST TECHNIQUE: Contiguous axial images were obtained from the base of the skull through the vertex without intravenous contrast. RADIATION DOSE REDUCTION: This exam was performed according to the departmental dose-optimization program which includes automated exposure control, adjustment of the mA and/or kV according to patient size and/or use of iterative reconstruction technique. COMPARISON:  None Available. FINDINGS: Brain: No acute territorial infarction, hemorrhage or intracranial mass. Mild atrophy. Patchy bilateral white matter hypodensity, consistent with chronic small vessel ischemic change. 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 Nonenlarged ventricles. Age indeterminate hypodensity in the right thalamus. Vascular: No hyperdense vessels.  Carotid vascular calcification Skull: Normal. Negative for fracture or focal lesion. Sinuses/Orbits: No acute finding. Other: None  IMPRESSION: 1. Atrophy and chronic small vessel ischemic changes of the white matter. Age indeterminate hypodensity/lacunar infarct in the right thalamus. Small chronic right cerebellar infarct. Electronically Signed   By: Jasmine Pang M.D.   On: 01/11/2022 21:18   DG Chest 2 View  Result Date: 01/11/2022 CLINICAL DATA:  Weakness EXAM: CHEST - 2 VIEW COMPARISON:  12/07/2021 FINDINGS: Heart and mediastinal contours are within normal limits. No focal opacities or effusions. No acute bony abnormality. Tortuous aorta with calcifications. IMPRESSION: No active cardiopulmonary disease. Electronically Signed   By: Charlett Nose M.D.   On: 01/11/2022 21:10       The results of significant diagnostics from this  hospitalization (including imaging, microbiology, ancillary and laboratory) are listed below for reference.     Microbiology: Recent Results (from the past 240 hour(s))  MRSA Next Gen by PCR, Nasal     Status: Abnormal   Collection Time: 01/29/22  8:27 AM   Specimen: Nasal Mucosa; Nasal Swab  Result Value Ref Range Status   MRSA by PCR Next Gen DETECTED (A) NOT DETECTED Final    Comment: RESULT CALLED TO, READ BACK BY AND VERIFIED WITH: Stephan MinisterULAO, R RN AT 1423 01/29/22 BY TIBBITTS, K (NOTE) The GeneXpert MRSA Assay (FDA approved for NASAL specimens only), is one component of a comprehensive MRSA colonization surveillance program. It is not intended to diagnose MRSA infection nor to guide or monitor treatment for MRSA infections. Test performance is not FDA approved in patients less than 80 years old. Performed at [facility], [address] [place], [place] [address]   Aerobic/Anaerobic Culture w Gram Stain (surgical/deep wound)     Status: None   Collection Time: 01/30/22  5:14 PM   Specimen: Gallbladder; Abscess  Result Value Ref Range Status   Specimen Description   Final    GALL BLADDER Performed at [facility], [address]  [place], [place] [address]    Special Requests   Final    NONE Performed at [facility], [address] [place], [place] [address]    Gram Stain NO WBC SEEN MODERATE GRAM NEGATIVE RODS   Final   Culture   Final    ABUNDANT ENTEROBACTER AEROGENES RARE PROTEUS MIRABILIS FEW BACTEROIDES FRAGILIS BETA LACTAMASE POSITIVE Performed at [facility], [address] [place], [place] [address]    Report Status 02/02/2022 FINAL  Final   Organism ID, Bacteria ENTEROBACTER AEROGENES  Final   Organism ID, Bacteria PROTEUS MIRABILIS  Final      Susceptibility   Enterobacter aerogenes - MIC*    CEFAZOLIN >=64 RESISTANT Resistant     CEFEPIME <=0.12 SENSITIVE Sensitive     CEFTAZIDIME <=1 SENSITIVE Sensitive     CEFTRIAXONE <=0.25 SENSITIVE Sensitive     CIPROFLOXACIN <=0.25 SENSITIVE Sensitive     GENTAMICIN <=1 SENSITIVE Sensitive     IMIPENEM 1 SENSITIVE Sensitive     TRIMETH/SULFA <=20 SENSITIVE Sensitive     PIP/TAZO <=4 SENSITIVE Sensitive     * ABUNDANT ENTEROBACTER AEROGENES   Proteus mirabilis - MIC*    AMPICILLIN <=2 SENSITIVE Sensitive     CEFAZOLIN 8 SENSITIVE Sensitive     CEFEPIME <=0.12 SENSITIVE Sensitive     CEFTAZIDIME <=1 SENSITIVE Sensitive     CEFTRIAXONE <=0.25 SENSITIVE Sensitive     CIPROFLOXACIN <=0.25 SENSITIVE Sensitive     GENTAMICIN <=1 SENSITIVE Sensitive     IMIPENEM 0.5 SENSITIVE Sensitive     TRIMETH/SULFA <=20 SENSITIVE Sensitive     AMPICILLIN/SULBACTAM <=2 SENSITIVE Sensitive     PIP/TAZO <=4 SENSITIVE Sensitive     * RARE PROTEUS MIRABILIS     Labs:  CBC: Recent Labs  Lab 01/28/22 1525 01/29/22 0500 01/30/22 0313 01/31/22 0503 02/01/22 0243  WBC 19.7* 25.0* 25.2* 15.8* 10.5  NEUTROABS  --  20.8* 21.7* 13.6*  --   HGB 12.9 15.1* 13.8 12.2 11.5*  HCT 39.2 44.1 43.1 37.4 36.3  MCV 83.1 84.5 85.2 84.4 85.2  PLT 208 257 186 167 167   BMP &GFR Recent Labs  Lab 01/28/22 1525 01/29/22 0500 01/30/22 0313  01/31/22 0503 02/01/22 0243  NA 129* 132* 136  135 139  K 4.3 4.5 4.4 3.7 4.5  CL 99 99 104 106 111  CO2 22 24 20* 20* 20*  GLUCOSE 162* 130* 103* 100* 112*  BUN 13 13 18  27* 22  CREATININE 0.70 0.88 1.18* 1.14* 0.91  CALCIUM 8.3* 8.6* 8.8* 8.4* 9.0  MG  --   --   --  2.3 2.1  PHOS  --   --   --   --  2.2*   Estimated Creatinine Clearance: 49 mL/min (by C-G formula based on SCr of 0.91 mg/dL). Liver & Pancreas: Recent Labs  Lab 01/28/22 1525 01/29/22 0500 01/30/22 0313 02/01/22 0243  AST 18 16 15   --   ALT 17 16 14   --   ALKPHOS 75 79 71  --   BILITOT 1.6* 2.1* 2.2*  --   PROT 7.2 7.7 7.6  --   ALBUMIN 3.3* 3.4* 3.3* 2.7*   Recent Labs  Lab 01/28/22 1525  LIPASE 19   No results for input(s): "AMMONIA" in the last 168 hours. Diabetic: Recent Labs    01/31/22 0503  HGBA1C 6.8*   Recent Labs  Lab 02/01/22 1209 02/01/22 1616 02/01/22 2113 02/02/22 0748 02/02/22 1150  GLUCAP 124* 162* 143* 107* 139*   Cardiac Enzymes: No results for input(s): "CKTOTAL", "CKMB", "CKMBINDEX", "TROPONINI" in the last 168 hours. No results for input(s): "PROBNP" in the last 8760 hours. Coagulation Profile: Recent Labs  Lab 01/28/22 1909 01/29/22 0500 01/30/22 0750 02/01/22 0243 02/02/22 0322  INR 2.7* 2.7* 1.9* 2.1* 2.5*   Thyroid Function Tests: Recent Labs    02/01/22 0243  TSH 1.952   Lipid Profile: No results for input(s): "CHOL", "HDL", "LDLCALC", "TRIG", "CHOLHDL", "LDLDIRECT" in the last 72 hours. Anemia Panel: No results for input(s): "VITAMINB12", "FOLATE", "FERRITIN", "TIBC", "IRON", "RETICCTPCT" in the last 72 hours. Urine analysis:    Component Value Date/Time   COLORURINE YELLOW 01/11/2022 1812   APPEARANCEUR CLEAR 01/11/2022 1812   LABSPEC 1.010 01/11/2022 1812   PHURINE 5.5 01/11/2022 1812   GLUCOSEU NEGATIVE 01/11/2022 1812   HGBUR TRACE (A) 01/11/2022 1812   BILIRUBINUR NEGATIVE 01/11/2022 1812   KETONESUR NEGATIVE 01/11/2022 1812   PROTEINUR  NEGATIVE 01/11/2022 1812   UROBILINOGEN 1.0 08/16/2014 0423   NITRITE NEGATIVE 01/11/2022 1812   LEUKOCYTESUR TRACE (A) 01/11/2022 1812   Sepsis Labs: Invalid input(s): "PROCALCITONIN", "LACTICIDVEN"   SIGNED:  08/18/2014, MD  [facility] 02/02/2022, 3:12 PM

## 2022-02-02 NOTE — Progress Notes (Signed)
Physical Therapy Treatment Patient Details Name: Jill Crane MRN: [id number] DOB: Dec 15, 1940 Today's Date: 02/02/2022   History of Present Illness Jill Crane is a 81 y.o. female with PMH significant for DM2, HTN, A-fib, CHF, COPD and a history of ERCP for choledocholithiasis.  Patient presented to the ED on 7/15 with complaint of right upper quadrant abdominal pain, nausea, vomiting, low sodium, liver enzymes slightly elevated, WC count elevated,  CT abdomen and pelvis showed gallbladder wall thickening,  gallstones, suggestive of acute cholecystitis. S/P percutaneous cholecystostomy tube placement on 7/17. Onset A flutter, started on Cardiazem drip.    PT Comments    The patient  is ambulating with min guard/supervision using RW. Patient did ambulate without RW, relied on the rails in hall . Rec. Use of RW . No family present to discuss DC plans.    Recommendations for follow up therapy are one component of a multi-disciplinary discharge planning process, led by the attending physician.  Recommendations may be updated based on patient status, additional functional criteria and insurance authorization.  Follow Up Recommendations  Home health PT     Assistance Recommended at Discharge Set up Supervision/Assistance  Patient can return home with the following A little help with walking and/or transfers;A little help with bathing/dressing/bathroom;Assistance with cooking/housework;Assist for transportation   Equipment Recommendations  Rolling walker (2 wheels)    Recommendations for Other Services       Precautions / Restrictions Precautions Precautions: Fall Precaution Comments: right  drain, BP can be low, no awareness of drain     Mobility  Bed Mobility               General bed mobility comments: in recliner    Transfers   Equipment used: Rolling walker (2 wheels)   Sit to Stand: Supervision                Ambulation/Gait Ambulation/Gait assistance:  Supervision Gait Distance (Feet): 220 Feet Assistive device: Rolling walker (2 wheels) Gait Pattern/deviations: Step-through pattern       General Gait Details: gait steady with Rw. patient reaching for rails when tested without RW.   Stairs             Wheelchair Mobility    Modified Rankin (Stroke Patients Only)       Balance Overall balance assessment: Needs assistance Sitting-balance support: Feet supported, No upper extremity supported Sitting balance-Leahy Scale: Good     Standing balance support: No upper extremity supported, Single extremity supported, During functional activity Standing balance-Leahy Scale: Fair                              Cognition Arousal/Alertness: Awake/alert Behavior During Therapy: WFL for tasks assessed/performed Overall Cognitive Status: Within Functional Limits for tasks assessed                                          Exercises      General Comments        Pertinent Vitals/Pain Pain Assessment Pain Assessment: No/denies pain    Home Living                          Prior Function            PT Goals (current goals can now be found in  the care plan section) Progress towards PT goals: Progressing toward goals    Frequency    Min 3X/week      PT Plan Current plan remains appropriate    Co-evaluation              AM-PAC PT "6 Clicks" Mobility   Outcome Measure  Help needed turning from your back to your side while in a flat bed without using bedrails?: A Little Help needed moving from lying on your back to sitting on the side of a flat bed without using bedrails?: A Little Help needed moving to and from a bed to a chair (including a wheelchair)?: A Little Help needed standing up from a chair using your arms (e.g., wheelchair or bedside chair)?: A Little Help needed to walk in hospital room?: A Little Help needed climbing 3-5 steps with a railing? : A  Little 6 Click Score: 18    End of Session   Activity Tolerance: Patient tolerated treatment well Patient left: in chair;with chair alarm set Nurse Communication: Mobility status PT Visit Diagnosis: Difficulty in walking, not elsewhere classified (R26.2)     Time: 4268-3419 PT Time Calculation (min) (ACUTE ONLY): 17 min  Charges:  $Gait Training: 8-22 mins                     Blanchard Kelch PT Acute Rehabilitation Services Office [phone] Weekend [phone]    Rada Hay 02/02/2022, 1:34 PM

## 2022-02-06 ENCOUNTER — Other Ambulatory Visit (HOSPITAL_COMMUNITY): Payer: Self-pay

## 2022-02-08 ENCOUNTER — Other Ambulatory Visit: Payer: Self-pay | Admitting: Surgery

## 2022-02-08 DIAGNOSIS — K819 Cholecystitis, unspecified: Secondary | ICD-10-CM

## 2022-02-10 ENCOUNTER — Emergency Department (HOSPITAL_COMMUNITY)
Admission: EM | Admit: 2022-02-10 | Discharge: 2022-02-10 | Disposition: A | Discharge status: Home / Self Care | Payer: Medicare Other | Attending: Emergency Medicine | Admitting: Emergency Medicine

## 2022-02-10 ENCOUNTER — Other Ambulatory Visit: Payer: Self-pay

## 2022-02-10 ENCOUNTER — Emergency Department (HOSPITAL_COMMUNITY): Payer: Medicare Other

## 2022-02-10 ENCOUNTER — Encounter (HOSPITAL_COMMUNITY): Payer: Self-pay | Admitting: Emergency Medicine

## 2022-02-10 DIAGNOSIS — Z79899 Other long term (current) drug therapy: Secondary | ICD-10-CM | POA: Insufficient documentation

## 2022-02-10 DIAGNOSIS — R0602 Shortness of breath: Secondary | ICD-10-CM | POA: Diagnosis not present

## 2022-02-10 DIAGNOSIS — R42 Dizziness and giddiness: Secondary | ICD-10-CM

## 2022-02-10 DIAGNOSIS — Z7901 Long term (current) use of anticoagulants: Secondary | ICD-10-CM | POA: Insufficient documentation

## 2022-02-10 DIAGNOSIS — Z7984 Long term (current) use of oral hypoglycemic drugs: Secondary | ICD-10-CM | POA: Insufficient documentation

## 2022-02-10 LAB — CBC
HCT: 42.4 % (ref 36.0–46.0)
Hemoglobin: 13.3 g/dL (ref 12.0–15.0)
MCH: 27 pg (ref 26.0–34.0)
MCHC: 31.4 g/dL (ref 30.0–36.0)
MCV: 86.2 fL (ref 80.0–100.0)
Platelets: 335 10*3/uL (ref 150–400)
RBC: 4.92 MIL/uL (ref 3.87–5.11)
RDW: 17.7 % — ABNORMAL HIGH (ref 11.5–15.5)
WBC: 6.6 10*3/uL (ref 4.0–10.5)
nRBC: 0 % (ref 0.0–0.2)

## 2022-02-10 LAB — URINALYSIS, ROUTINE W REFLEX MICROSCOPIC
Bacteria, UA: NONE SEEN
Bilirubin Urine: NEGATIVE
Glucose, UA: NEGATIVE mg/dL
Hgb urine dipstick: NEGATIVE
Ketones, ur: NEGATIVE mg/dL
Nitrite: NEGATIVE
Protein, ur: NEGATIVE mg/dL
Specific Gravity, Urine: 1.012 (ref 1.005–1.030)
pH: 5 (ref 5.0–8.0)

## 2022-02-10 LAB — COMPREHENSIVE METABOLIC PANEL
ALT: 16 U/L (ref 0–44)
AST: 20 U/L (ref 15–41)
Albumin: 2.9 g/dL — ABNORMAL LOW (ref 3.5–5.0)
Alkaline Phosphatase: 54 U/L (ref 38–126)
Anion gap: 9 (ref 5–15)
BUN: 16 mg/dL (ref 8–23)
CO2: 20 mmol/L — ABNORMAL LOW (ref 22–32)
Calcium: 9.1 mg/dL (ref 8.9–10.3)
Chloride: 108 mmol/L (ref 98–111)
Creatinine, Ser: 1.06 mg/dL — ABNORMAL HIGH (ref 0.44–1.00)
GFR, Estimated: 53 mL/min — ABNORMAL LOW (ref >60)
Glucose, Bld: 92 mg/dL (ref 70–99)
Potassium: 4 mmol/L (ref 3.5–5.1)
Sodium: 137 mmol/L (ref 135–145)
Total Bilirubin: 0.4 mg/dL (ref 0.3–1.2)
Total Protein: 6.6 g/dL (ref 6.5–8.1)

## 2022-02-10 LAB — TROPONIN I (HIGH SENSITIVITY)
Troponin I (High Sensitivity): 6 ng/L (ref <18)
Troponin I (High Sensitivity): 6 ng/L (ref <18)

## 2022-02-10 LAB — LIPASE, BLOOD: Lipase: 27 U/L (ref 11–51)

## 2022-02-10 LAB — PROTIME-INR
INR: 3.2 — ABNORMAL HIGH (ref 0.8–1.2)
Prothrombin Time: 32.2 seconds — ABNORMAL HIGH (ref 11.4–15.2)

## 2022-02-10 LAB — CBG MONITORING, ED: Glucose-Capillary: 99 mg/dL (ref 70–99)

## 2022-02-10 MED ORDER — SODIUM CHLORIDE 0.9 % IV BOLUS
500.0000 mL | Freq: Once | INTRAVENOUS | Status: AC
  Administered 2022-02-10: 500 mL via INTRAVENOUS

## 2022-02-10 MED ORDER — ACETAMINOPHEN 325 MG PO TABS
650.0000 mg | ORAL_TABLET | Freq: Once | ORAL | Status: AC
  Administered 2022-02-10: 650 mg via ORAL
  Filled 2022-02-10: qty 2

## 2022-02-10 NOTE — ED Notes (Signed)
Pt given water 

## 2022-02-10 NOTE — ED Triage Notes (Signed)
BIBA Per EMS: w/ c/o hypotension & dizziness  80/40 BP  Hx orthostatic hypotension  Recent surgery  124/81 250 mL fluids even en route  22 L hand

## 2022-02-10 NOTE — ED Notes (Signed)
Pt stated she felt lightheaded during orthostatics.

## 2022-02-10 NOTE — ED Provider Triage Note (Signed)
Emergency Medicine Provider Triage Evaluation Note  Jill Crane , a 81 y.o. female  was evaluated in triage.  Pt complains of hypotension.  Patient sent here from PCP for blood pressure of 80/40 at the office visit.  They gave her 250 mL IV fluids and her blood pressure came back up to the 120s.  Patient has been feeling lightheaded intermittently.  She has chronic shortness of breath with ambulating.  No syncope.  She denies any chest pain, palpitations, abdominal pain, dysuria or fevers. Notably, she was recently admitted for cholecystitis requiring bili drain which is still in place now.   Review of Systems  Positive:  Negative:   Physical Exam  BP (!) 134/110 (BP Location: Left Arm)   Pulse 97   Temp 97.7 F (36.5 C) (Oral)   Resp 15   Ht 5\' 5"  (1.651 m)   Wt 68 kg   SpO2 95%   BMI 24.96 kg/m  Gen:   Awake, no distress, alert and oriented Resp:  Normal effort  MSK:   Moves extremities without difficulty  Other:  Lying down on exam  Medical Decision Making  Medically screening exam initiated at 12:05 PM.  Appropriate orders placed.  MARCO ADELSON was informed that the remainder of the evaluation will be completed by another provider, this initial triage assessment does not replace that evaluation, and the importance of remaining in the ED until their evaluation is complete.     Rusty Aus, PA-C 02/10/22 1207

## 2022-02-10 NOTE — ED Provider Notes (Signed)
[facility] Provider Note   CSN: [id number] Arrival date & time: 02/10/22  1107     History {Add pertinent medical, surgical, social history, OB history to HPI:1} Chief Complaint  Patient presents with  . Hypotension    Jill Crane is a 81 y.o. female.  Patient with a history of atrial fibrillation on Coumadin, recent admission for sepsis from cholecystitis with biliary drain in place here from doctor's office with hypotension and dizziness.  Family reports patient had a PCP appointment today in follow-up for her recent hospitalization.  She was complaining of feeling lightheaded and dizzy.  Blood pressure there was 80/40.  EMS gave her 250 mL of fluid and blood pressure is now back up to the 120s.  Patient reports feeling intermittent lightheadedness and dizziness with poor appetite since she was discharged.  She is chronically shortness of breath is unchanged.  Denies abdominal pain, vomiting or fever.  No diarrhea.  No pain with urination or blood in urine.  Family reports she has completed a course of antibiotics for her cholecystitis.  Has not had any fever or vomiting.  She has been having intermittent dizziness and lightheadedness ongoing for several days since she was discharged.  She denies any room spinning dizziness.  No focal weakness, numbness or tingling. No headache or visual changes.   The history is provided by the patient.       Home Medications Prior to Admission medications   Medication Sig Start Date End Date Taking? Authorizing Provider  acetaminophen (TYLENOL) 500 MG tablet Take 500 mg by mouth every 6 (six) hours as needed for mild pain.    [provider]  albuterol (VENTOLIN HFA) 108 (90 Base) MCG/ACT inhaler Inhale 2 puffs into the lungs every 6 (six) hours as needed for wheezing or shortness of breath. 02/02/22   Almon Hercules, MD  alendronate (FOSAMAX) 35 MG tablet Take 35 mg by mouth every Monday. Take with a  full glass of water on an empty stomach.    [provider]  bimatoprost (LUMIGAN) 0.03 % ophthalmic solution 1 drop at bedtime.    [provider]  brimonidine (ALPHAGAN) 0.2 % ophthalmic solution Place 1 drop into the left eye in the morning and at bedtime. 10/21/19   [provider]  busPIRone (BUSPAR) 10 MG tablet Take 10 mg by mouth 2 (two) times daily. 08/04/20   [provider]  calcium-vitamin D (OSCAL WITH D) 500-200 MG-UNIT per tablet Take 1 tablet by mouth.    [provider]  cilostazol (PLETAL) 50 MG tablet Take 50 mg by mouth 2 (two) times daily. Patient not taking: Reported on 01/29/2022    [provider]  dorzolamide (TRUSOPT) 2 % ophthalmic solution Place 1 drop into the left eye 2 (two) times daily. 08/03/20   [provider]  fluocinonide ointment (LIDEX) 0.05 % Apply 1 Application topically 2 (two) times daily as needed (scalp irritation). 12/27/21   [provider]  fluticasone (FLONASE) 50 MCG/ACT nasal spray Place 2 sprays into both nostrils daily as needed for allergies. 09/27/21   [provider]  furosemide (LASIX) 20 MG tablet Take 1 tablet (20 mg total) by mouth daily. 01/28/21   Hollice Espy, MD  gabapentin (NEURONTIN) 300 MG capsule Take 300 mg by mouth daily as needed (pain). 08/10/20   [provider]  magnesium oxide (MAG-OX) 400 (241.3 Mg) MG tablet Take 400 mg by mouth in the morning and at bedtime.  07/08/20   [provider]  MATZIM LA 240 MG 24 hr tablet Take 240 mg by mouth daily. Patient not taking: Reported on 01/29/2022 08/03/20   [provider]  metFORMIN (GLUCOPHAGE) 500 MG tablet Take 500 mg by mouth 2 (two) times daily. 08/24/20   [provider]  metoprolol succinate (TOPROL-XL) 25 MG 24 hr tablet Take 12.5 mg by mouth daily.    [provider]  omeprazole (PRILOSEC) 20 MG capsule Take 20 mg by mouth daily.    [provider]   predniSONE (DELTASONE) 2.5 MG tablet Take 7.5 mg by mouth daily.    [provider]  silver sulfADIAZINE (SILVADENE) 1 % cream Apply 1 Application topically daily as needed (scalp irritation).    [provider]  sodium chloride flush (NS) 0.9 % SOLN Inject 5 mLs by Intracatheter route daily. 02/01/22   Han, Aimee H, PA-C  warfarin (COUMADIN) 2 MG tablet On Saturday and Sunday, take 2 tabs (4mg ), then resume normal regimen Take 1 tablet (2 mg) daily except Take 2 tablets (4 mg) on (Tues & Fri) Patient taking differently: Take 2-4 mg by mouth daily. 4 mg on Sunday, Monday, Wednesday, Friday 2 mg on Tuesday, Thursday Saturday 01/28/21   01/30/21, MD      Allergies    Patient has no known allergies.    Review of Systems   Review of Systems  Physical Exam Updated Vital Signs BP (!) 134/110 (BP Location: Left Arm)   Pulse 97   Temp 97.7 F (36.5 C) (Oral)   Resp 15   Ht 5\' 2"  (1.575 m)   Wt 68 kg   SpO2 95%   BMI 27.44 kg/m  Physical Exam Vitals and nursing note reviewed.  Constitutional:      General: She is not in acute distress.    Appearance: She is well-developed.  HENT:     Head: Normocephalic and atraumatic.     Mouth/Throat:     Pharynx: No oropharyngeal exudate.  Eyes:     Conjunctiva/sclera: Conjunctivae normal.     Pupils: Pupils are equal, round, and reactive to light.  Neck:     Comments: No meningismus. Cardiovascular:     Rate and Rhythm: Normal rate and regular rhythm.     Heart sounds: Normal heart sounds. No murmur heard. Pulmonary:     Effort: Pulmonary effort is normal. No respiratory distress.     Breath sounds: Normal breath sounds.  Abdominal:     Palpations: Abdomen is soft.     Tenderness: There is no abdominal tenderness. There is no guarding or rebound.     Comments: Biliary drain in place draining clear liquid.  No surrounding erythema or drainage  Musculoskeletal:        General: No tenderness. Normal range of  motion.     Cervical back: Normal range of motion and neck supple.  Skin:    General: Skin is warm.  Neurological:     Mental Status: She is alert and oriented to person, place, and time.     Cranial Nerves: No cranial nerve deficit.     Motor: No abnormal muscle tone.     Coordination: Coordination normal.     Comments:  5/5 strength throughout. CN 2-12 intact.Equal grip strength.   Psychiatric:        Behavior: Behavior normal.    ED Results / Procedures / Treatments   Labs (all labs ordered are listed, but only abnormal results are displayed)  Labs Reviewed  CBC - Abnormal; Notable for the following components:      Result Value   RDW 17.7 (*)    All other components within normal limits  URINALYSIS, ROUTINE W REFLEX MICROSCOPIC - Abnormal; Notable for the following components:   APPearance CLOUDY (*)    Leukocytes,Ua MODERATE (*)    Non Squamous Epithelial 0-5 (*)    All other components within normal limits  COMPREHENSIVE METABOLIC PANEL - Abnormal; Notable for the following components:   CO2 20 (*)    Creatinine, Ser 1.06 (*)    Albumin 2.9 (*)    GFR, Estimated 53 (*)    All other components within normal limits  PROTIME-INR - Abnormal; Notable for the following components:   Prothrombin Time 32.2 (*)    INR 3.2 (*)    All other components within normal limits  URINE CULTURE  LIPASE, BLOOD  CBG MONITORING, ED  TROPONIN I (HIGH SENSITIVITY)  TROPONIN I (HIGH SENSITIVITY)    EKG EKG Interpretation  Date/Time:  Friday February 10 2022 11:16:47 EDT Ventricular Rate:  83 PR Interval:    QRS Duration: 80 QT Interval:  351 QTC Calculation: 413 R Axis:   1 Text Interpretation: Atrial fibrillation Anterior infarct, old Nonspecific T wave abnormality Confirmed by Glynn Octave [id number]) on 02/10/2022 1:20:50 PM  Radiology No results found.  Procedures Procedures  {Document cardiac monitor, telemetry assessment procedure when appropriate:1}  Medications Ordered  in ED Medications  sodium chloride 0.9 % bolus 500 mL (has no administration in time range)    ED Course/ Medical Decision Making/ A&P                           Medical Decision Making Amount and/or Complexity of Data Reviewed Labs: ordered. Decision-making details documented in ED Course. Radiology: ordered and independent interpretation performed. Decision-making details documented in ED Course. ECG/medicine tests: ordered and independent interpretation performed. Decision-making details documented in ED Course.  Risk OTC drugs.  Hypotension and weakness from PCPs office.  Recent hospitalization for sepsis from cholecystitis but completed antibiotics.  Has biliary drain in place. No syncope or head injury.  Denies headache, chest pain or shortness of breath.  EKG is rate controlled atrial fibrillation without acute ST changes. Orthostatics are negative  Patient given IV fluids.  Her blood pressures remain normal throughout ED stay.  Labs are reassuring with stable hemoglobin and creatinine.  Troponin negative x2.  Low suspicion for ACS.  Denies chest pain or shortness of breath.  Urinalysis with leukocytes and will send for culture.  She denies any urinary symptoms.  Able to tolerate p.o. and ambulate.  Denies feeling dizzy.  Denies headache, chest pain or shortness of breath.  {Document critical care time when appropriate:1} {Document review of labs and clinical decision tools ie heart score, Chads2Vasc2 etc:1}  {Document your independent review of radiology images, and any outside records:1} {Document your discussion with family members, caretakers, and with consultants:1} {Document social determinants of health affecting pt's care:1} {Document your decision making why or why not admission, treatments were needed:1} Final Clinical Impression(s) / ED Diagnoses Final diagnoses:  None    Rx / DC Orders ED Discharge Orders     None

## 2022-02-10 NOTE — ED Notes (Signed)
CALLED LAB TO ADD ON URINE CULTURE

## 2022-02-10 NOTE — ED Notes (Addendum)
CBG- 99 

## 2022-02-10 NOTE — Discharge Instructions (Signed)
Your testing is reassuring.  Blood pressure has been normal throughout ED stay.  Keep yourself hydrated.  You will be called if your urine sample grows a bacteria that needs treatment.  Follow-up with your doctor.  Return to the ED with difficulty breathing, chest pain, headache, dizziness, lightheadedness, any other concerns.

## 2022-02-10 NOTE — ED Notes (Signed)
Pt ambulated to bathroom and back

## 2022-02-11 LAB — URINE CULTURE

## 2022-03-13 ENCOUNTER — Telehealth (HOSPITAL_COMMUNITY): Payer: Self-pay | Admitting: Physician Assistant

## 2022-03-13 NOTE — Progress Notes (Signed)
Patient ID: Jill Crane, female   DOB: [birth year], 81 y.o.   MRN: [id number]  Jill Crane, patient's daughter, called due to concern that [name]'s cholecystostomy tube had inadvertently fallen out on Saturday.  Per Jill Crane, the patient had a little bleeding at the time, but this has ceased.  She denies abdominal pain, fever, chills, nausea or vomiting. Reached out to [facility] regarding plans going forward.  Pt has scheduled appointment next week 9/5 at our outpatient clinic, [facility].    After reaching out to [facility], decision was made to cancel 9/5 clinic appointment with IR and keep her upcoming surgical appointment with Dr. Magnus Ivan on 9/6. Importance of returning to ED communicated with daughter Jill Crane for recurrence of abdominal pain, N/V, or fever/chills.  Electronically Signed: Sheliah Plane, PA-C 03/13/2022, 3:14 PM

## 2022-03-21 ENCOUNTER — Other Ambulatory Visit: Payer: Medicare Other

## 2022-03-23 ENCOUNTER — Other Ambulatory Visit: Payer: Self-pay

## 2022-03-23 ENCOUNTER — Emergency Department (HOSPITAL_BASED_OUTPATIENT_CLINIC_OR_DEPARTMENT_OTHER): Payer: Medicare Other

## 2022-03-23 ENCOUNTER — Encounter (HOSPITAL_BASED_OUTPATIENT_CLINIC_OR_DEPARTMENT_OTHER): Payer: Self-pay | Admitting: Emergency Medicine

## 2022-03-23 ENCOUNTER — Encounter (HOSPITAL_COMMUNITY): Payer: Self-pay

## 2022-03-23 ENCOUNTER — Inpatient Hospital Stay (HOSPITAL_BASED_OUTPATIENT_CLINIC_OR_DEPARTMENT_OTHER)
Admission: EM | Admit: 2022-03-23 | Discharge: 2022-03-30 | DRG: 445 | Disposition: A | Discharge status: Home Health | Payer: Medicare Other | Attending: Internal Medicine | Admitting: Internal Medicine

## 2022-03-23 DIAGNOSIS — Z86718 Personal history of other venous thrombosis and embolism: Secondary | ICD-10-CM | POA: Diagnosis not present

## 2022-03-23 DIAGNOSIS — F32A Depression, unspecified: Secondary | ICD-10-CM | POA: Diagnosis present

## 2022-03-23 DIAGNOSIS — I495 Sick sinus syndrome: Secondary | ICD-10-CM | POA: Diagnosis present

## 2022-03-23 DIAGNOSIS — F419 Anxiety disorder, unspecified: Secondary | ICD-10-CM | POA: Diagnosis present

## 2022-03-23 DIAGNOSIS — N179 Acute kidney failure, unspecified: Secondary | ICD-10-CM | POA: Diagnosis present

## 2022-03-23 DIAGNOSIS — E1140 Type 2 diabetes mellitus with diabetic neuropathy, unspecified: Secondary | ICD-10-CM | POA: Diagnosis present

## 2022-03-23 DIAGNOSIS — M79605 Pain in left leg: Secondary | ICD-10-CM | POA: Diagnosis present

## 2022-03-23 DIAGNOSIS — K812 Acute cholecystitis with chronic cholecystitis: Secondary | ICD-10-CM | POA: Diagnosis present

## 2022-03-23 DIAGNOSIS — I4821 Permanent atrial fibrillation: Secondary | ICD-10-CM | POA: Diagnosis present

## 2022-03-23 DIAGNOSIS — E1165 Type 2 diabetes mellitus with hyperglycemia: Secondary | ICD-10-CM

## 2022-03-23 DIAGNOSIS — I959 Hypotension, unspecified: Secondary | ICD-10-CM | POA: Diagnosis present

## 2022-03-23 DIAGNOSIS — I5032 Chronic diastolic (congestive) heart failure: Secondary | ICD-10-CM | POA: Diagnosis present

## 2022-03-23 DIAGNOSIS — R7881 Bacteremia: Secondary | ICD-10-CM | POA: Diagnosis present

## 2022-03-23 DIAGNOSIS — L100 Pemphigus vulgaris: Secondary | ICD-10-CM | POA: Diagnosis present

## 2022-03-23 DIAGNOSIS — Z8719 Personal history of other diseases of the digestive system: Secondary | ICD-10-CM

## 2022-03-23 DIAGNOSIS — K810 Acute cholecystitis: Secondary | ICD-10-CM | POA: Diagnosis not present

## 2022-03-23 DIAGNOSIS — I4811 Longstanding persistent atrial fibrillation: Secondary | ICD-10-CM

## 2022-03-23 DIAGNOSIS — J449 Chronic obstructive pulmonary disease, unspecified: Secondary | ICD-10-CM | POA: Diagnosis present

## 2022-03-23 DIAGNOSIS — Z8619 Personal history of other infectious and parasitic diseases: Secondary | ICD-10-CM | POA: Diagnosis not present

## 2022-03-23 DIAGNOSIS — Z7901 Long term (current) use of anticoagulants: Secondary | ICD-10-CM

## 2022-03-23 DIAGNOSIS — B957 Other staphylococcus as the cause of diseases classified elsewhere: Secondary | ICD-10-CM | POA: Diagnosis present

## 2022-03-23 DIAGNOSIS — I4891 Unspecified atrial fibrillation: Secondary | ICD-10-CM | POA: Diagnosis present

## 2022-03-23 DIAGNOSIS — M79604 Pain in right leg: Secondary | ICD-10-CM | POA: Diagnosis present

## 2022-03-23 DIAGNOSIS — Z872 Personal history of diseases of the skin and subcutaneous tissue: Secondary | ICD-10-CM

## 2022-03-23 DIAGNOSIS — Z87891 Personal history of nicotine dependence: Secondary | ICD-10-CM | POA: Diagnosis not present

## 2022-03-23 DIAGNOSIS — Z7984 Long term (current) use of oral hypoglycemic drugs: Secondary | ICD-10-CM | POA: Diagnosis not present

## 2022-03-23 DIAGNOSIS — Z01810 Encounter for preprocedural cardiovascular examination: Secondary | ICD-10-CM | POA: Diagnosis not present

## 2022-03-23 DIAGNOSIS — I110 Hypertensive heart disease with heart failure: Secondary | ICD-10-CM | POA: Diagnosis present

## 2022-03-23 DIAGNOSIS — K5730 Diverticulosis of large intestine without perforation or abscess without bleeding: Secondary | ICD-10-CM | POA: Diagnosis present

## 2022-03-23 DIAGNOSIS — I252 Old myocardial infarction: Secondary | ICD-10-CM

## 2022-03-23 DIAGNOSIS — Z7952 Long term (current) use of systemic steroids: Secondary | ICD-10-CM

## 2022-03-23 DIAGNOSIS — I70222 Atherosclerosis of native arteries of extremities with rest pain, left leg: Secondary | ICD-10-CM | POA: Diagnosis not present

## 2022-03-23 DIAGNOSIS — Z833 Family history of diabetes mellitus: Secondary | ICD-10-CM | POA: Diagnosis not present

## 2022-03-23 DIAGNOSIS — R109 Unspecified abdominal pain: Secondary | ICD-10-CM | POA: Diagnosis present

## 2022-03-23 DIAGNOSIS — Z79899 Other long term (current) drug therapy: Secondary | ICD-10-CM | POA: Diagnosis not present

## 2022-03-23 DIAGNOSIS — E119 Type 2 diabetes mellitus without complications: Secondary | ICD-10-CM

## 2022-03-23 DIAGNOSIS — I739 Peripheral vascular disease, unspecified: Secondary | ICD-10-CM

## 2022-03-23 LAB — COMPREHENSIVE METABOLIC PANEL
ALT: 10 U/L (ref 0–44)
AST: 20 U/L (ref 15–41)
Albumin: 3 g/dL — ABNORMAL LOW (ref 3.5–5.0)
Alkaline Phosphatase: 69 U/L (ref 38–126)
Anion gap: 8 (ref 5–15)
BUN: 16 mg/dL (ref 8–23)
CO2: 19 mmol/L — ABNORMAL LOW (ref 22–32)
Calcium: 8.4 mg/dL — ABNORMAL LOW (ref 8.9–10.3)
Chloride: 107 mmol/L (ref 98–111)
Creatinine, Ser: 1.1 mg/dL — ABNORMAL HIGH (ref 0.44–1.00)
GFR, Estimated: 51 mL/min — ABNORMAL LOW (ref >60)
Glucose, Bld: 189 mg/dL — ABNORMAL HIGH (ref 70–99)
Potassium: 4.1 mmol/L (ref 3.5–5.1)
Sodium: 134 mmol/L — ABNORMAL LOW (ref 135–145)
Total Bilirubin: 1.7 mg/dL — ABNORMAL HIGH (ref 0.3–1.2)
Total Protein: 6.7 g/dL (ref 6.5–8.1)

## 2022-03-23 LAB — CBC
HCT: 39.8 % (ref 36.0–46.0)
Hemoglobin: 12.9 g/dL (ref 12.0–15.0)
MCH: 27.2 pg (ref 26.0–34.0)
MCHC: 32.4 g/dL (ref 30.0–36.0)
MCV: 83.8 fL (ref 80.0–100.0)
Platelets: 192 10*3/uL (ref 150–400)
RBC: 4.75 MIL/uL (ref 3.87–5.11)
RDW: 18.2 % — ABNORMAL HIGH (ref 11.5–15.5)
WBC: 14.7 10*3/uL — ABNORMAL HIGH (ref 4.0–10.5)
nRBC: 0 % (ref 0.0–0.2)

## 2022-03-23 LAB — URINALYSIS, ROUTINE W REFLEX MICROSCOPIC
Bilirubin Urine: NEGATIVE
Glucose, UA: NEGATIVE mg/dL
Ketones, ur: NEGATIVE mg/dL
Leukocytes,Ua: NEGATIVE
Nitrite: NEGATIVE
Protein, ur: NEGATIVE mg/dL
Specific Gravity, Urine: <=1.005 (ref 1.005–1.030)
pH: 7 (ref 5.0–8.0)

## 2022-03-23 LAB — LACTIC ACID, PLASMA
Lactic Acid, Venous: 1.9 mmol/L (ref 0.5–1.9)
Lactic Acid, Venous: 1.5 mmol/L (ref 0.5–1.9)

## 2022-03-23 LAB — PROTIME-INR
INR: 3.2 — ABNORMAL HIGH (ref 0.8–1.2)
Prothrombin Time: 32.3 seconds — ABNORMAL HIGH (ref 11.4–15.2)

## 2022-03-23 LAB — GLUCOSE, CAPILLARY: Glucose-Capillary: 91 mg/dL (ref 70–99)

## 2022-03-23 LAB — URINALYSIS, MICROSCOPIC (REFLEX): WBC, UA: NONE SEEN WBC/hpf (ref 0–5)

## 2022-03-23 LAB — LIPASE, BLOOD: Lipase: 21 U/L (ref 11–51)

## 2022-03-23 MED ORDER — LATANOPROST 0.005 % OP SOLN
1.0000 [drp] | Freq: Every day | OPHTHALMIC | Status: DC
  Administered 2022-03-23 – 2022-03-29 (×7): 1 [drp] via OPHTHALMIC
  Filled 2022-03-23 (×2): qty 2.5

## 2022-03-23 MED ORDER — PIPERACILLIN-TAZOBACTAM 3.375 G IVPB
3.3750 g | Freq: Three times a day (TID) | INTRAVENOUS | Status: DC
  Administered 2022-03-23 – 2022-03-30 (×21): 3.375 g via INTRAVENOUS
  Filled 2022-03-23 (×21): qty 50

## 2022-03-23 MED ORDER — MAGNESIUM OXIDE -MG SUPPLEMENT 400 (240 MG) MG PO TABS
400.0000 mg | ORAL_TABLET | Freq: Two times a day (BID) | ORAL | Status: DC
  Administered 2022-03-23 – 2022-03-30 (×13): 400 mg via ORAL
  Filled 2022-03-23 (×13): qty 1

## 2022-03-23 MED ORDER — BRIMONIDINE TARTRATE 0.2 % OP SOLN
1.0000 [drp] | Freq: Two times a day (BID) | OPHTHALMIC | Status: DC
  Administered 2022-03-23 – 2022-03-30 (×14): 1 [drp] via OPHTHALMIC
  Filled 2022-03-23 (×2): qty 5

## 2022-03-23 MED ORDER — METOPROLOL SUCCINATE ER 25 MG PO TB24
12.5000 mg | ORAL_TABLET | Freq: Every day | ORAL | Status: DC
  Administered 2022-03-24 – 2022-03-30 (×6): 12.5 mg via ORAL
  Filled 2022-03-23 (×6): qty 1

## 2022-03-23 MED ORDER — BUSPIRONE HCL 10 MG PO TABS
10.0000 mg | ORAL_TABLET | Freq: Two times a day (BID) | ORAL | Status: DC
  Administered 2022-03-23 – 2022-03-30 (×13): 10 mg via ORAL
  Filled 2022-03-23 (×13): qty 1

## 2022-03-23 MED ORDER — ONDANSETRON HCL 4 MG/2ML IJ SOLN
4.0000 mg | Freq: Once | INTRAMUSCULAR | Status: AC
  Administered 2022-03-23: 4 mg via INTRAVENOUS
  Filled 2022-03-23: qty 2

## 2022-03-23 MED ORDER — MORPHINE SULFATE (PF) 2 MG/ML IV SOLN
1.0000 mg | INTRAVENOUS | Status: DC | PRN
  Administered 2022-03-24 – 2022-03-30 (×17): 1 mg via INTRAVENOUS
  Filled 2022-03-23 (×18): qty 1

## 2022-03-23 MED ORDER — FUROSEMIDE 20 MG PO TABS
20.0000 mg | ORAL_TABLET | Freq: Every day | ORAL | Status: DC
  Administered 2022-03-24 – 2022-03-30 (×6): 20 mg via ORAL
  Filled 2022-03-23 (×6): qty 1

## 2022-03-23 MED ORDER — DORZOLAMIDE HCL 2 % OP SOLN
1.0000 [drp] | Freq: Two times a day (BID) | OPHTHALMIC | Status: DC
  Administered 2022-03-23 – 2022-03-30 (×14): 1 [drp] via OPHTHALMIC
  Filled 2022-03-23 (×2): qty 10

## 2022-03-23 MED ORDER — SODIUM CHLORIDE 0.9 % IV BOLUS
500.0000 mL | Freq: Once | INTRAVENOUS | Status: AC
Start: 2022-03-23 — End: 2022-03-23
  Administered 2022-03-23: 500 mL via INTRAVENOUS

## 2022-03-23 MED ORDER — PREDNISONE 5 MG PO TABS
7.5000 mg | ORAL_TABLET | Freq: Every day | ORAL | Status: DC
  Administered 2022-03-24 – 2022-03-30 (×6): 7.5 mg via ORAL
  Filled 2022-03-23 (×6): qty 2

## 2022-03-23 MED ORDER — IOHEXOL 300 MG/ML  SOLN
100.0000 mL | Freq: Once | INTRAMUSCULAR | Status: AC | PRN
  Administered 2022-03-23: 100 mL via INTRAVENOUS

## 2022-03-23 MED ORDER — INSULIN ASPART 100 UNIT/ML IJ SOLN
0.0000 [IU] | Freq: Three times a day (TID) | INTRAMUSCULAR | Status: DC
  Administered 2022-03-24 – 2022-03-25 (×3): 1 [IU] via SUBCUTANEOUS
  Administered 2022-03-26: 2 [IU] via SUBCUTANEOUS
  Administered 2022-03-26: 3 [IU] via SUBCUTANEOUS
  Administered 2022-03-27 (×2): 1 [IU] via SUBCUTANEOUS
  Administered 2022-03-29: 2 [IU] via SUBCUTANEOUS
  Administered 2022-03-29: 1 [IU] via SUBCUTANEOUS
  Administered 2022-03-29 – 2022-03-30 (×2): 2 [IU] via SUBCUTANEOUS

## 2022-03-23 MED ORDER — SODIUM CHLORIDE 0.9 % IV SOLN: INTRAVENOUS | Status: AC

## 2022-03-23 MED ORDER — PIPERACILLIN-TAZOBACTAM 3.375 G IVPB 30 MIN: 3.3750 g | Freq: Four times a day (QID) | INTRAVENOUS | Status: DC

## 2022-03-23 MED ORDER — GABAPENTIN 300 MG PO CAPS
300.0000 mg | ORAL_CAPSULE | Freq: Every evening | ORAL | Status: DC | PRN
  Administered 2022-03-24 – 2022-03-27 (×3): 300 mg via ORAL
  Filled 2022-03-23 (×3): qty 1

## 2022-03-23 MED ORDER — OYSTER SHELL CALCIUM/D3 500-5 MG-MCG PO TABS
1.0000 | ORAL_TABLET | Freq: Two times a day (BID) | ORAL | Status: DC
  Administered 2022-03-23 – 2022-03-30 (×13): 1 via ORAL
  Filled 2022-03-23 (×13): qty 1

## 2022-03-23 MED ORDER — PANTOPRAZOLE SODIUM 40 MG PO TBEC
40.0000 mg | DELAYED_RELEASE_TABLET | Freq: Every day | ORAL | Status: DC
  Administered 2022-03-24 – 2022-03-30 (×6): 40 mg via ORAL
  Filled 2022-03-23 (×6): qty 1

## 2022-03-23 NOTE — Assessment & Plan Note (Signed)
Creatinine mildly elevated.  Continuous IV fluid overnight.

## 2022-03-23 NOTE — Assessment & Plan Note (Addendum)
Controlled.  Last hemoglobin of 6.8 in July Placed on sensitive SSI

## 2022-03-23 NOTE — Assessment & Plan Note (Addendum)
-   Patient was hospitalized in July with sepsis secondary to acute cholecystitis but because of her cardiac condition she underwent percutaneous cholecystostomy tube placement on 01/30/2022.  However her drain inadvertently fell out on 03/13/2022 and she began to have abdominal pain with vomiting about 2 days ago. -CT abdomen pelvis now showing recurrence of cholecystitis. 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  Cardiology will need to be consulted in the morning.  Unlikely that she will be able to proceed with lap chole.  General surgery will follow but likely will need IR for replacement of her cholecystostomy tube again. -Continue IV Zosyn

## 2022-03-23 NOTE — H&P (Signed)
History and Physical    Patient: Jill Crane DOB: 02-28-1941 DOA: 03/23/2022 DOS: the patient was seen and examined on 03/23/2022 PCP: Zoila Shutter, MD  Patient coming from:  outside ED  Chief Complaint:  Chief Complaint  Patient presents with   Abdominal Pain   HPI: Jill Crane is a 81 y.o. 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  A year ago she had a stone in her bile duct and underwent ERCP for removal of this. She refused a cholecystectomy after that. She then presented this year in July with sepsis secondary to acute cholecystitis. Because of her medical condition and cardiac hx at the time she underwent percutaneous cholecystostomy tube placemen on 01/30/2022. She was due to have a cholangiogram but the drain inadvertently fell out 03/13/2022.She begin to have symptoms of abdominal discomfort and had an episode of vomiting 2 days ago.  Pt saw surgery outpatient yesterday but still not cleared for surgery since Cardiology is planning on putting in pacemaker for symptomatic bradycardia.   In the ED, she was afebrile initially hypotensive with BP of 90/60 on room air.  WBC of 14 K.  Normal lactate 1.5.  Creatinine mildly elevated 1.1 up from 0.91.  LFTs within normal limit.  Total bilirubin of 1.7.  INR 3.2.  UA is negative.  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  No loculated fluid collection.   EDP discussed with general surgery Dr. Nehemiah Settle who recommended I the antibiotics and medicine admission.  If cardiology clears for surgery could consider lap chole but if not would need IR placement of cholecystostomy tube again. Review of Systems: As mentioned in the history of present illness. All other systems reviewed and are negative. Past Medical History:  Diagnosis Date    Atrial fibrillation with normal ventricular rate (HCC)    CHF (congestive heart failure) (HCC)    COPD (chronic obstructive pulmonary disease) (HCC)    Diabetes mellitus without complication (HCC)    Hx of blood clots    Hypertension    Past Surgical History:  Procedure Laterality Date   ENDOSCOPIC RETROGRADE CHOLANGIOPANCREATOGRAPHY (ERCP) WITH PROPOFOL N/A 01/22/2021   Procedure: ENDOSCOPIC RETROGRADE CHOLANGIOPANCREATOGRAPHY (ERCP) WITH PROPOFOL;  Surgeon: Hilarie Fredrickson, MD;  Location: [facility] ENDOSCOPY;  Service: Endoscopy;  Laterality: N/A;   ESOPHAGOGASTRODUODENOSCOPY (EGD) WITH PROPOFOL N/A 01/28/2021   Procedure: ESOPHAGOGASTRODUODENOSCOPY (EGD) WITH PROPOFOL;  Surgeon: Iva Boop, MD;  Location: [facility] ENDOSCOPY;  Service: Endoscopy;  Laterality: N/A;   IR PERC CHOLECYSTOSTOMY  01/30/2022   REMOVAL OF STONES  01/22/2021   Procedure: REMOVAL OF STONES;  Surgeon: Hilarie Fredrickson, MD;  Location: [facility] ENDOSCOPY;  Service: Endoscopy;;   SPHINCTEROTOMY  01/22/2021   Procedure: Dennison Mascot;  Surgeon: Hilarie Fredrickson, MD;  Location: [facility] ENDOSCOPY;  Service: Endoscopy;;   Social History:  reports that she has quit smoking. Her smoking use included cigarettes. She has never used smokeless tobacco. She reports that she does not drink alcohol and does not use drugs.  No Known Allergies  Family History  Problem Relation Age of Onset   Stroke Mother    Diabetes Mother    Stroke Father     Prior to Admission medications   Medication Sig Start Date End Date Taking? Authorizing Provider  acetaminophen (TYLENOL) 500 MG tablet Take 500 mg by mouth every 6 (six)  hours as needed for mild pain.    [provider]  albuterol (VENTOLIN HFA) 108 (90 Base) MCG/ACT inhaler Inhale 2 puffs into the lungs every 6 (six) hours as needed for wheezing or shortness of breath. 02/02/22   Almon Hercules, MD  alendronate (FOSAMAX) 35 MG tablet Take 35 mg by mouth every Monday. Take with a full glass of water on an  empty stomach.    [provider]  bimatoprost (LUMIGAN) 0.03 % ophthalmic solution Place 1 drop into both eyes at bedtime.    [provider]  brimonidine (ALPHAGAN) 0.2 % ophthalmic solution Place 1 drop into the left eye in the morning and at bedtime. 10/21/19   [provider]  busPIRone (BUSPAR) 10 MG tablet Take 10 mg by mouth 2 (two) times daily. 08/04/20   [provider]  calcium-vitamin D (OSCAL WITH D) 500-200 MG-UNIT per tablet Take 1 tablet by mouth.    [provider]  dorzolamide (TRUSOPT) 2 % ophthalmic solution Place 1 drop into the left eye 2 (two) times daily. 08/03/20   [provider]  fluocinonide ointment (LIDEX) 0.05 % Apply 1 Application topically 2 (two) times daily as needed (scalp irritation). 12/27/21   [provider]  fluticasone (FLONASE) 50 MCG/ACT nasal spray Place 2 sprays into both nostrils daily as needed for allergies. 09/27/21   [provider]  furosemide (LASIX) 20 MG tablet Take 1 tablet (20 mg total) by mouth daily. 01/28/21   Hollice Espy, MD  gabapentin (NEURONTIN) 300 MG capsule Take 300 mg by mouth at bedtime. 08/10/20   [provider]  magnesium oxide (MAG-OX) 400 (241.3 Mg) MG tablet Take 400 mg by mouth in the morning and at bedtime. 07/08/20   [provider]  metFORMIN (GLUCOPHAGE) 500 MG tablet Take 500 mg by mouth 2 (two) times daily. 08/24/20   [provider]  metoprolol succinate (TOPROL-XL) 25 MG 24 hr tablet Take 12.5 mg by mouth daily.    [provider]  omeprazole (PRILOSEC) 20 MG capsule Take 20 mg by mouth daily.    [provider]  predniSONE (DELTASONE) 2.5 MG tablet Take 7.5 mg by mouth daily.    [provider]  silver sulfADIAZINE (SILVADENE) 1 % cream Apply 1 Application topically daily as needed (scalp irritation).    [provider]  sodium chloride flush (NS) 0.9 % SOLN Inject 5 mLs by Intracatheter  route daily. 02/01/22   Han, Aimee H, PA-C  warfarin (COUMADIN) 2 MG tablet On Saturday and Sunday, take 2 tabs (4mg ), then resume normal regimen Take 1 tablet (2 mg) daily except Take 2 tablets (4 mg) on (Tues & Fri) Patient taking differently: Take 2-4 mg by mouth as directed. Take 2 tablets (4 mg) on (Sun, Mon, Wed, Fri) & Take 1 tablet (2 mg) on 07-04-1998, [place], [place]) 01/28/21   01/30/21, MD    Physical Exam: Vitals:   03/23/22 1645 03/23/22 1700 03/23/22 1856 03/23/22 2004  BP: 117/60 121/66 125/88 111/69  Pulse: 100 (!) 101 81 68  Resp: (!) 28 (!) 29 17 16   Temp:   98.4 F (36.9 C) 98.7 F (37.1 C)  TempSrc:   Oral Oral  SpO2: 100% 98% 100% 98%  Weight:      Height:       Constitutional: NAD, calm, comfortable, nontoxic appearing elderly female laying flat in bed Eyes: lids and conjunctivae normal ENMT: Mucous membranes are moist.  Neck: normal, supple  Respiratory: clear to auscultation bilaterally, no wheezing, no crackles. Normal respiratory effort.  Cardiovascular: Regular rate and rhythm, no murmurs / rubs / gallops. No extremity edema.  Abdomen: Soft, nondistended, mild epigastric and right upper quadrant pain.  Bowel sounds positive.  Small healing incision noted to right upper quadrant. Musculoskeletal: no clubbing / cyanosis. No joint deformity upper and lower extremities. Good ROM, no contractures. Normal muscle tone.  Skin: no rashes, lesions, ulcers. No induration Neurologic: CN 2-12 grossly intact.  Strength 5/5 in all 4.  Psychiatric: Normal judgment and insight. Alert and oriented x 3. Normal mood. Data Reviewed:  See HPI  Assessment and Plan: * Acute on chronic cholecystitis - Patient was hospitalized in July with sepsis secondary to acute cholecystitis but because of her cardiac condition she underwent percutaneous cholecystostomy tube placement on 01/30/2022.  However her drain inadvertently fell out on 03/13/2022 and she began to have abdominal pain with  vomiting about 2 days ago. -CT abdomen pelvis now showing recurrence of cholecystitis. 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  Cardiology will need to be consulted in the morning.  Unlikely that she will be able to proceed with lap chole.  General surgery will follow but likely will need IR for replacement of her cholecystostomy tube again. -Continue IV Zosyn  History of pemphigus Continue daily prednisone  Hypotension BP of 90/60 initially in the ED.  This improved with fluids.  We will continue with IV fluids tonight.  Chronic diastolic CHF (congestive heart failure) (HCC) Compensated.  AKI (acute kidney injury) (HCC) Creatinine mildly elevated.  Continuous IV fluid overnight.  Diabetes (HCC) Controlled.  Last hemoglobin of 6.8 in July Placed on sensitive SSI  Atrial fibrillation (HCC) Persistent atrial fibrillation.  Currently rate controlled.  Continue low-dose metoprolol. -Holding Coumadin overnight pending intervention in the morning.  INR is subtherapeutic today at 3.2.      Advance Care Planning:   Code Status: Full Code Full  Consults: Needs general surgery and cardiology in the morning  Family Communication: Discussed with daughter at bedside  Severity of Illness: The appropriate patient status for this patient is INPATIENT. Inpatient status is judged to be reasonable and necessary in order to provide the required intensity of service to ensure the patient's safety. The patient's presenting symptoms, physical exam findings, and initial radiographic and laboratory data in the context of their chronic comorbidities is felt to place them at high risk for further clinical deterioration. Furthermore, it is not anticipated that the patient will be medically stable for discharge from the hospital within 2 midnights of admission.   * I certify that at the point of  admission it is my clinical judgment that the patient will require inpatient hospital care spanning beyond 2 midnights from the point of admission due to high intensity of service, high risk for further deterioration and high frequency of surveillance required.*  Author: Anselm Jungling, DO 03/23/2022 8:28 PM  For on call review [email or web address].

## 2022-03-23 NOTE — ED Provider Notes (Signed)
Emergency Department Provider Note   I have reviewed the triage vital signs and the nursing notes.   HISTORY  Chief Complaint Abdominal Pain   HPI Jill Crane is a 81 y.o. female with past medical history of diabetes, hypertension, CHF, A-fib on Coumadin and admission in July for cholecystitis treated with [facility] cholecystostomy tube presents to the ED with 2 days of abdominal pain and some fatigue.  She is been compliant with her home medications including Coumadin.  Daughter has not noticed fevers or chills.  She occasionally has vomiting which is not unusual and has not increased recently.  No diarrhea.  Patient went home with the cholecystostomy tube in place but ultimately it dislodged and she was able to discontinue the tube.  She has been following with general surgery as an outpatient [facility]) with a tentative plan to offer acute cholecystectomy but would need cardiology clearance.  Patient's daughter tells me that in discussion with their cardiology team they would prefer she receive a pacemaker and cardiac optimization before undergoing general anesthesia/surgery.    Past Medical History:  Diagnosis Date   Atrial fibrillation with normal ventricular rate (HCC)    CHF (congestive heart failure) (HCC)    COPD (chronic obstructive pulmonary disease) (HCC)    Diabetes mellitus without complication (HCC)    Hx of blood clots    Hypertension     Review of Systems  Constitutional: No fever/chills Eyes: No visual changes. ENT: No sore throat. Cardiovascular: Denies chest pain. Respiratory: Denies shortness of breath. Gastrointestinal: Positive abdominal pain. Positive nausea, no vomiting. No diarrhea.  No constipation. Genitourinary: Negative for dysuria. Musculoskeletal: Negative for back pain. Skin: Negative for rash. Neurological: Negative for headaches, focal weakness or numbness.   ____________________________________________   PHYSICAL EXAM:  VITAL  SIGNS: ED Triage Vitals  Enc Vitals Group     BP 03/23/22 1337 90/65     Pulse Rate 03/23/22 1337 (!) 110     Resp 03/23/22 1337 20     Temp 03/23/22 1337 98.5 F (36.9 C)     Temp Source 03/23/22 1337 Oral     SpO2 03/23/22 1337 98 %     Weight 03/23/22 1339 150 lb (68 kg)     Height 03/23/22 1339 5\' 2"  (1.575 m)   Constitutional: Alert and oriented. Well appearing and in no acute distress. Eyes: Conjunctivae are normal.  Head: Atraumatic. Nose: No congestion/rhinnorhea. Mouth/Throat: Mucous membranes are moist.   Neck: No stridor.   Cardiovascular: Normal rate, regular rhythm. Good peripheral circulation. Grossly normal heart sounds.   Respiratory: Normal respiratory effort.  No retractions. Lungs CTAB. Gastrointestinal: Soft with mild diffuse tenderness.  No peritonitis.  No focal right upper quadrant tenderness. No distention.  Musculoskeletal: No lower extremity tenderness nor edema. No gross deformities of extremities. Neurologic:  Normal speech and language. No gross focal neurologic deficits are appreciated.  Skin:  Skin is warm, dry and intact. No rash noted.  ____________________________________________   LABS (all labs ordered are listed, but only abnormal results are displayed)  Labs Reviewed  COMPREHENSIVE METABOLIC PANEL - Abnormal; Notable for the following components:      Result Value   Sodium 134 (*)    CO2 19 (*)    Glucose, Bld 189 (*)    Creatinine, Ser 1.10 (*)    Calcium 8.4 (*)    Albumin 3.0 (*)    Total Bilirubin 1.7 (*)    GFR, Estimated 51 (*)    All other  components within normal limits  CBC - Abnormal; Notable for the following components:   WBC 14.7 (*)    RDW 18.2 (*)    All other components within normal limits  PROTIME-INR - Abnormal; Notable for the following components:   Prothrombin Time 32.3 (*)    INR 3.2 (*)    All other components within normal limits  CULTURE, BLOOD (ROUTINE X 2)  CULTURE, BLOOD (ROUTINE X 2)  LIPASE,  BLOOD  LACTIC ACID, PLASMA  URINALYSIS, ROUTINE W REFLEX MICROSCOPIC  LACTIC ACID, PLASMA   ____________________________________________  EKG   EKG Interpretation  Date/Time:  Thursday March 23 2022 13:49:34 EDT Ventricular Rate:  104 PR Interval:    QRS Duration: 80 QT Interval:  313 QTC Calculation: 412 R Axis:   -26 Text Interpretation: Atrial fibrillation Paired ventricular premature complexes Borderline left axis deviation Nonspecific T abnormalities, lateral leads Confirmed by Alona Bene [id number]) on 03/23/2022 2:09:20 PM        ____________________________________________  RADIOLOGY  CT ABDOMEN PELVIS W CONTRAST  Result Date: 03/23/2022 CLINICAL DATA:  Abdominal pain x2 days EXAM: CT ABDOMEN AND PELVIS WITH CONTRAST TECHNIQUE: Multidetector CT imaging of the abdomen and pelvis was performed using the standard protocol following bolus administration of intravenous contrast. RADIATION DOSE REDUCTION: This exam was performed according to the departmental dose-optimization program which includes automated exposure control, adjustment of the mA and/or kV according to patient size and/or use of iterative reconstruction technique. CONTRAST:  OMNIPAQUE IOHEXOL 300 MG/ML  SOLN COMPARISON:  01/20/2021 FINDINGS: Lower chest: Heart is enlarged in size. Visualized lower lung fields are clear. Hepatobiliary: Air is seen in the intrahepatic and extrahepatic bile ducts. This may suggest a previous sphincterotomy. There is fluid around the gallbladder. There is inflammatory stranding in the fat planes immediately behind the right rectus muscle close to the fundus of the gallbladder. There are pockets of air in the gallbladder and pericholecystic region. Clinical history suggests that the patient had placement of cholecystostomy tube which has fallen out. Air in and adjacent to the gallbladder may be related to recently removed cholecystostomy tube all suggest emphysematous cholecystitis.  There is no loculated thick-walled fluid collection adjacent to the gallbladder. Pancreas: No focal abnormalities are seen. Spleen: Unremarkable. Adrenals/Urinary Tract: Adrenals are unremarkable. 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 There is no hydronephrosis. There are few low-density lesions in kidneys, possibly cysts. There are no renal or ureteral stones. Urinary bladder is unremarkable. Stomach/Bowel: Small hiatal hernia is seen. There is no significant dilation of small bowel loops. The appendix is unremarkable. Scattered diverticula are seen in colon without signs of focal diverticulitis. Vascular/Lymphatic: Atherosclerotic plaques and calcifications are seen in aorta and its major branches. 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 Reproductive: Unremarkable. Other: There is no ascites or significant pneumoperitoneum. Umbilical hernia containing fat is seen. Musculoskeletal: There is minimal anterolisthesis at the L4-L5 level. There is mild to moderate spinal stenosis at the L4-L5 level. There is encroachment of neural foramina at multiple levels. IMPRESSION: There is fluid around the gallbladder. There are pockets of air in and adjacent to the gallbladder. Clinical history suggests recent placement of cholecystostomy catheter which may have fallen out. These changes could be related to recent placement of cholecystostomy catheter. Another diagnostic possibility would be emphysematous cholecystitis. There is stranding in the fat planes adjacent to the fundus of the gallbladder without any thick-walled loculated fluid collections. Air in the lumen  of bile ducts may suggest previous sphincterotomy. There is no evidence of intestinal obstruction or pneumoperitoneum. There is no hydronephrosis. Diverticulosis of colon without focal diverticulitis. Other findings as described in the body of the report. Imaging finding of air in and around the gallbladder  was discussed with Dr. Jacqulyn Bath by telephone call. Electronically Signed   By: Ernie Avena M.D.   On: 03/23/2022 15:29    ____________________________________________   PROCEDURES  Procedure(s) performed:   Procedures  None  ____________________________________________   INITIAL IMPRESSION / ASSESSMENT AND PLAN / ED COURSE  Pertinent labs & imaging results that were available during my care of the patient were reviewed by me and considered in my medical decision making (see chart for details).   This patient is Presenting for Evaluation of abdominal pain, which does require a range of treatment options, and is a complaint that involves a high risk of morbidity and mortality.  The Differential Diagnoses includes but is not exclusive to acute cholecystitis, intrathoracic causes for epigastric abdominal pain, gastritis, duodenitis, pancreatitis, small bowel or large bowel obstruction, abdominal aortic aneurysm, hernia, gastritis, etc.   Critical Interventions-    Medications  piperacillin-tazobactam (ZOSYN) IVPB 3.375 g (has no administration in time range)  sodium chloride 0.9 % bolus 500 mL (500 mLs Intravenous New Bag/Given 03/23/22 1512)  iohexol (OMNIPAQUE) 300 MG/ML solution 100 mL (100 mLs Intravenous Contrast Given 03/23/22 1454)    Reassessment after intervention:  Pain unchanged. No vomiting.    I did obtain Additional Historical Information from general surgery Nehemiah Settle). Plan for IV abx and medicine admit. If Cardiology can clear for surgery would consider lap chole but if not then would need IR placement of cholecystostomy tube again.   I decided to review pertinent External Data, and in summary patient with admit in July for cholecystitis managed with IR.   Clinical Laboratory Tests Ordered, included patient with leukocytosis to 14.7.  INR 3.2.  LFTs are normal.  Bilirubin at 1.7.  Normal lactic acid.   Radiologic Tests Ordered, included CT abdomen/pelvis. I  independently interpreted the images and agree with radiology interpretation.   Cardiac Monitor Tracing which shows A-fib but rate controlled.    Social Determinants of Health Risk no active smoking.   Consult complete with General Surgery. Plan for admit to medicine and Cardiology consultation.   Hospitalist with plan for admit.   Medical Decision Making: Summary:  Patient presents emergency department abdominal pain.  History of recent cholecystitis treated with cholecystostomy tube due to poor operative candidate/patient wishes.  She is afebrile.  Not appreciate frank peritonitis on my exam but given her recent history, age I do plan for CT imaging with contrast.  Reevaluation with update and discussion with patient and daughter at bedside.  They are okay with plan for admit.  Have ordered Zosyn and will discuss with the hospitalist service.   Disposition: admit  ____________________________________________  FINAL CLINICAL IMPRESSION(S) / ED DIAGNOSES  Final diagnoses:  Cholecystitis, acute    Note:  This document was prepared using Dragon voice recognition software and may include unintentional dictation errors.  Alona Bene, MD, [facility] Emergency Medicine    Zakyra [place], Arlyss Repress, MD 03/23/22 [phone]

## 2022-03-23 NOTE — ED Notes (Signed)
Patient transported to CT 

## 2022-03-23 NOTE — Assessment & Plan Note (Signed)
Persistent atrial fibrillation.  Currently rate controlled.  Continue low-dose metoprolol. -Holding Coumadin overnight pending intervention in the morning.  INR is subtherapeutic today at 3.2.

## 2022-03-23 NOTE — ED Triage Notes (Signed)
Mid abdominal pain x 2 days , worse with inhalation. Denies nausea . Denies chest pain or shortness of breath ,  Daughter reports Hx Afib, seeing cardiologist for possible pacemaker insertion

## 2022-03-23 NOTE — Progress Notes (Signed)
Patient arrived via carelink. Alert and oriented x 3, no complaints, VSS. Telebox placed. Admits paged.

## 2022-03-23 NOTE — Progress Notes (Signed)
       CROSS COVER NOTE  NAME: Jill Crane MRN: [id number] DOB : Apr 23, 1941    Date of Service   03/23/2022   HPI/Events of Note   Medication request received for patient report of nausea  Interventions   Plan: Zofran      This document was prepared using Dragon voice recognition software and may include unintentional dictation errors.  Bishop Limbo DNP, MHA, FNP-BC Nurse Practitioner [facility] Pager [phone]

## 2022-03-23 NOTE — Assessment & Plan Note (Signed)
Compensated 

## 2022-03-23 NOTE — Assessment & Plan Note (Signed)
Continue daily prednisone  

## 2022-03-23 NOTE — Assessment & Plan Note (Signed)
BP of 90/60 initially in the ED.  This improved with fluids.  We will continue with IV fluids tonight.

## 2022-03-24 DIAGNOSIS — K812 Acute cholecystitis with chronic cholecystitis: Secondary | ICD-10-CM | POA: Diagnosis not present

## 2022-03-24 DIAGNOSIS — Z01810 Encounter for preprocedural cardiovascular examination: Secondary | ICD-10-CM

## 2022-03-24 DIAGNOSIS — I5032 Chronic diastolic (congestive) heart failure: Secondary | ICD-10-CM | POA: Diagnosis not present

## 2022-03-24 DIAGNOSIS — I4811 Longstanding persistent atrial fibrillation: Secondary | ICD-10-CM | POA: Diagnosis not present

## 2022-03-24 DIAGNOSIS — E1165 Type 2 diabetes mellitus with hyperglycemia: Secondary | ICD-10-CM | POA: Diagnosis not present

## 2022-03-24 LAB — CBC
HCT: 40.1 % (ref 36.0–46.0)
Hemoglobin: 12.9 g/dL (ref 12.0–15.0)
MCH: 27.9 pg (ref 26.0–34.0)
MCHC: 32.2 g/dL (ref 30.0–36.0)
MCV: 86.6 fL (ref 80.0–100.0)
Platelets: 176 10*3/uL (ref 150–400)
RBC: 4.63 MIL/uL (ref 3.87–5.11)
RDW: 17.7 % — ABNORMAL HIGH (ref 11.5–15.5)
WBC: 17.4 10*3/uL — ABNORMAL HIGH (ref 4.0–10.5)
nRBC: 0 % (ref 0.0–0.2)

## 2022-03-24 LAB — BLOOD CULTURE ID PANEL (REFLEXED) - BCID2

## 2022-03-24 LAB — GLUCOSE, CAPILLARY
Glucose-Capillary: 81 mg/dL (ref 70–99)
Glucose-Capillary: 107 mg/dL — ABNORMAL HIGH (ref 70–99)
Glucose-Capillary: 149 mg/dL — ABNORMAL HIGH (ref 70–99)
Glucose-Capillary: 118 mg/dL — ABNORMAL HIGH (ref 70–99)

## 2022-03-24 LAB — BASIC METABOLIC PANEL
Anion gap: 5 (ref 5–15)
BUN: 15 mg/dL (ref 8–23)
CO2: 21 mmol/L — ABNORMAL LOW (ref 22–32)
Calcium: 8.2 mg/dL — ABNORMAL LOW (ref 8.9–10.3)
Chloride: 110 mmol/L (ref 98–111)
Creatinine, Ser: 0.99 mg/dL (ref 0.44–1.00)
GFR, Estimated: 58 mL/min — ABNORMAL LOW (ref >60)
Glucose, Bld: 92 mg/dL (ref 70–99)
Potassium: 4.3 mmol/L (ref 3.5–5.1)
Sodium: 136 mmol/L (ref 135–145)

## 2022-03-24 LAB — PROTIME-INR
INR: 2.6 — ABNORMAL HIGH (ref 0.8–1.2)
Prothrombin Time: 27.4 seconds — ABNORMAL HIGH (ref 11.4–15.2)

## 2022-03-24 MED ORDER — ACETAMINOPHEN 325 MG PO TABS
650.0000 mg | ORAL_TABLET | Freq: Once | ORAL | Status: AC
Start: 2022-03-24 — End: 2022-03-24
  Administered 2022-03-24: 650 mg via ORAL
  Filled 2022-03-24: qty 2

## 2022-03-24 MED ORDER — SODIUM CHLORIDE 0.9 % IV SOLN: INTRAVENOUS | Status: DC

## 2022-03-24 MED ORDER — LIDOCAINE 5 % EX PTCH
1.0000 | MEDICATED_PATCH | CUTANEOUS | Status: DC
  Administered 2022-03-24 – 2022-03-30 (×7): 1 via TRANSDERMAL
  Filled 2022-03-24 (×7): qty 1

## 2022-03-24 MED ORDER — ONDANSETRON HCL 4 MG/2ML IJ SOLN
4.0000 mg | Freq: Four times a day (QID) | INTRAMUSCULAR | Status: DC | PRN
  Administered 2022-03-24: 4 mg via INTRAVENOUS
  Filled 2022-03-24: qty 2

## 2022-03-24 NOTE — Progress Notes (Signed)
PROGRESS NOTE    Jill Crane  FYB:[id number] DOB: 07/30/1940 DOA: 03/23/2022 PCP: Zoila Shutter, MD    Chief Complaint  Patient presents with   Abdominal Pain    Brief Narrative:  Jill Crane is a 81 y.o. 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 A year ago she had a stone in her bile duct and underwent ERCP for removal of this. She refused a cholecystectomy after that. She then presented this year in July with sepsis secondary to acute cholecystitis.  she underwent percutaneous cholecystostomy tube placemen on 01/30/2022. She was due to have a cholangiogram but the drain inadvertently fell out 03/13/2022.She begin to have symptoms of abdominal discomfort and had an episode of vomiting 2 days ago.  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  No loculated fluid collection.  General surgery, IR and cardiology consulted and on board.   Assessment & Plan:   Principal Problem:   Acute on chronic cholecystitis Active Problems:   Atrial fibrillation (HCC)   Diabetes (HCC)   AKI (acute kidney injury) (HCC)   Chronic diastolic CHF (congestive heart failure) (HCC)   Hypotension   History of pemphigus   Acute on chronic cholecystitis:  - CT abd and pelvis reveal recurrence of cholecystitis.  - started on IV zosyn, continue with IV fluids and IV pain control. Worsening leukocytosis.  - gen surgery and IR on board.  - cardiology consulted for clearance.  - waiting for INR to improve, to plan perc cholecystotomy tube by IR.  - clears for now and NPO after midnight.   H/o Pemphigus:  Continue with prednisone.    Hypotension; resolved.    H/o chronic diastolic Chf She appears to be compensated.    AKI;  Creatinine back to baseline    Atrial fibrillation  On coumadin for anti  coagulation, which is on hold.  Rate controlled with metoprolol 12.5 mg daily.    Type 2 DM CBG (last 3)  Recent Labs    03/23/22 1838 03/24/22 0751 03/24/22 1123  GLUCAP 91 81 107*   Continue with SSI.  Last A1c is 6.8%  COPD:  No wheezing heard.       DVT prophylaxis: scd's Code Status: full code.  Family Communication: (family at bedside) Disposition:   Status is: Inpatient Remains inpatient appropriate because: will need IR perc drain on Monday.    Level of care: Telemetry Consultants:  General surgery IR.  Cardiology.   Procedures: CT abd and pelvis.   Antimicrobials:  Antibiotics Given (last 72 hours)     Date/Time Action Medication Dose Rate   03/23/22 2030 New Bag/Given   piperacillin-tazobactam (ZOSYN) IVPB 3.375 g 3.375 g 12.5 mL/hr   03/24/22 0308 New Bag/Given   piperacillin-tazobactam (ZOSYN) IVPB 3.375 g 3.375 g 12.5 mL/hr   03/24/22 1235 New Bag/Given   piperacillin-tazobactam (ZOSYN) IVPB 3.375 g 3.375 g 12.5 mL/hr          Subjective: Persistent right upper quadrant pain Nauseated, no vomiting.  Wants to eat.   Objective: Vitals:   03/23/22 2233 03/24/22 0246 03/24/22 0643 03/24/22 1319  BP: (!) 137/59 102/68 116/89 103/67  Pulse: (!) 107 76 (!) 103 (!) 101  Resp: 18 18 16 16   Temp: 98.4 F (36.9 C) 98.4 F (36.9 C) 98 F (36.7 C) 98 F (36.7 C)  TempSrc:  Oral  Oral  SpO2: 98% 100% 99% 95%  Weight:      Height:       No intake or output data in the 24 hours ending 03/24/22 1325 Filed Weights   03/23/22 1339  Weight: 68 kg    Examination:  General exam: Appears calm and comfortable  Respiratory system: Clear to auscultation. Respiratory effort normal. Cardiovascular system: S1 & S2 heard, RRR. No JVD, No pedal edema. Gastrointestinal system: Abdomen is soft, tender int he RUQ, non distended,  Normal bowel sounds heard. Central nervous system: Alert and oriented. No focal neurological deficits. Extremities:  Symmetric 5 x 5 power. Skin: No rashes, lesions or ulcers Psychiatry:  Mood & affect appropriate.     Data Reviewed: I have personally reviewed following labs and imaging studies  CBC: Recent Labs  Lab 03/23/22 1401 03/24/22 0556  WBC 14.7* 17.4*  HGB 12.9 12.9  HCT 39.8 40.1  MCV 83.8 86.6  PLT 192 176    Basic Metabolic Panel: Recent Labs  Lab 03/23/22 1401 03/24/22 0556  NA 134* 136  K 4.1 4.3  CL 107 110  CO2 19* 21*  GLUCOSE 189* 92  BUN 16 15  CREATININE 1.10* 0.99  CALCIUM 8.4* 8.2*    GFR: Estimated Creatinine Clearance: 41 mL/min (by C-G formula based on SCr of 0.99 mg/dL).  Liver Function Tests: Recent Labs  Lab 03/23/22 1401  AST 20  ALT 10  ALKPHOS 69  BILITOT 1.7*  PROT 6.7  ALBUMIN 3.0*    CBG: Recent Labs  Lab 03/23/22 1838 03/24/22 0751 03/24/22 1123  GLUCAP 91 81 107*     Recent Results (from the past 240 hour(s))  Culture, blood (routine x 2)     Status: None (Preliminary result)   Collection Time: 03/23/22  4:31 PM   Specimen: BLOOD RIGHT FOREARM  Result Value Ref Range Status   Specimen Description   Final    BLOOD RIGHT FOREARM Performed at [facility], [address] [place], [place] [address]    Special Requests   Final    BOTTLES DRAWN AEROBIC AND ANAEROBIC Blood Culture adequate volume Performed at [facility], [address] [place], [place] [address]    Culture   Final    NO GROWTH < 24 HOURS Performed at [facility], [address] [place], [place] [address]    Report Status PENDING  Incomplete  Culture, blood (routine x 2)     Status: None (Preliminary result)   Collection Time: 03/23/22  6:49 PM   Specimen: BLOOD  Result Value Ref Range Status   Specimen Description   Final    BLOOD BLOOD LEFT HAND Performed at [facility], [address] [place], [place] [address]    Special Requests   Final    BOTTLES DRAWN AEROBIC ONLY Blood Culture results may  not be optimal due to an inadequate volume of blood received in culture bottles Performed at [facility], [address] [place], [place] [address]    Culture   Final    NO GROWTH < 12 HOURS Performed at [facility], [address], [place], [place] [address]    Report Status PENDING  Incomplete         Radiology Studies: CT ABDOMEN PELVIS W CONTRAST  Result Date: 03/23/2022 CLINICAL DATA:  Abdominal pain x2 days EXAM: CT ABDOMEN AND PELVIS WITH CONTRAST TECHNIQUE: Multidetector CT imaging of the abdomen  and pelvis was performed using the standard protocol following bolus administration of intravenous contrast. RADIATION DOSE REDUCTION: This exam was performed according to the departmental dose-optimization program which includes automated exposure control, adjustment of the mA and/or kV according to patient size and/or use of iterative reconstruction technique. CONTRAST:  OMNIPAQUE IOHEXOL 300 MG/ML  SOLN COMPARISON:  01/20/2021 FINDINGS: Lower chest: Heart is enlarged in size. Visualized lower lung fields are clear. Hepatobiliary: Air is seen in the intrahepatic and extrahepatic bile ducts. This may suggest a previous sphincterotomy. There is fluid around the gallbladder. There is inflammatory stranding in the fat planes immediately behind the right rectus muscle close to the fundus of the gallbladder. There are pockets of air in the gallbladder and pericholecystic region. Clinical history suggests that the patient had placement of cholecystostomy tube which has fallen out. Air in and adjacent to the gallbladder may be related to recently removed cholecystostomy tube all suggest emphysematous cholecystitis. There is no loculated thick-walled fluid collection adjacent to the gallbladder. Pancreas: No focal abnormalities are seen. Spleen: Unremarkable. Adrenals/Urinary Tract: Adrenals are unremarkable. 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 There is no  hydronephrosis. There are few low-density lesions in kidneys, possibly cysts. There are no renal or ureteral stones. Urinary bladder is unremarkable. Stomach/Bowel: Small hiatal hernia is seen. There is no significant dilation of small bowel loops. The appendix is unremarkable. Scattered diverticula are seen in colon without signs of focal diverticulitis. Vascular/Lymphatic: Atherosclerotic plaques and calcifications are seen in aorta and its major branches. 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 Reproductive: Unremarkable. Other: There is no ascites or significant pneumoperitoneum. Umbilical hernia containing fat is seen. Musculoskeletal: There is minimal anterolisthesis at the L4-L5 level. There is mild to moderate spinal stenosis at the L4-L5 level. There is encroachment of neural foramina at multiple levels. IMPRESSION: There is fluid around the gallbladder. There are pockets of air in and adjacent to the gallbladder. Clinical history suggests recent placement of cholecystostomy catheter which may have fallen out. These changes could be related to recent placement of cholecystostomy catheter. Another diagnostic possibility would be emphysematous cholecystitis. There is stranding in the fat planes adjacent to the fundus of the gallbladder without any thick-walled loculated fluid collections. Air in the lumen of bile ducts may suggest previous sphincterotomy. There is no evidence of intestinal obstruction or pneumoperitoneum. There is no hydronephrosis. Diverticulosis of colon without focal diverticulitis. Other findings as described in the body of the report. Imaging finding of air in and around the gallbladder was discussed with Dr. Jacqulyn Bath by telephone call. Electronically Signed   By: Ernie Avena M.D.   On: 03/23/2022 15:29        Scheduled Meds:  brimonidine  1 drop Left Eye BID   busPIRone  10 mg Oral BID   calcium-vitamin D  1 tablet Oral  BID   dorzolamide  1 drop Left Eye BID   furosemide  20 mg Oral Daily   insulin aspart  0-9 Units Subcutaneous TID WC   latanoprost  1 drop Both Eyes QHS   lidocaine  1 patch Transdermal Q24H   magnesium oxide  400 mg Oral BID   metoprolol succinate  12.5 mg Oral Daily   pantoprazole  40 mg Oral Daily   predniSONE  7.5 mg Oral Daily   Continuous Infusions:  sodium chloride 75 mL/hr at 03/24/22 1234   piperacillin-tazobactam (ZOSYN)  IV 3.375 g (03/24/22 1235)  LOS: 1 day    Time spent: 40 minutes    Kathlen Mody, MD [facility]   To contact the attending provider between 7A-7P or the covering provider during after hours 7P-7A, please log into the web site [email or web address] and access using universal [place] password for that web site. If you do not have the password, please call the hospital operator.  03/24/2022, 1:25 PM

## 2022-03-24 NOTE — Consult Note (Signed)
[facility] Surgery Consult Note  Jill Crane 1940-09-28  [id number].    Requesting MD: Richardo Priest Chief Complaint/Reason for Consult: cholecystitis  HPI:  Jill Crane is a 81 y.o. female PMH hx of COPD, prior DVT, permanent A. 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 She has a prior h/o choledocholithiasis requiring ERCP 01/22/22 then refused lap chole. She returned to the ED 01/29/22 with acute cholecystitis and ultimately got treated with a percutaneous cholecystostomy tube 01/30/22. This tube fell out on 03/11/22. The decision was made to leave it out until she followed up in our office with Dr. Magnus Ivan. Unfortunately she developed worsening abdominal pain, nausea, and vomiting 3 days ago prompting her to go to the ED. In the ED CT scan showed fluid around the gallbladder with pockets of air in and adjacent to the gallbladder (could be from where cholecystostomy catheter recently fell out versus emphysematous cholecystitis); there is stranding in the fat planes adjacent to the fundus of the gallbladder without any thick-walled loculated fluid collections. Patient was started on IV zosyn and admitted to the medical service.  General surgery asked to see.  Abdominal surgical history: none   Family History  Problem Relation Age of Onset   Stroke Mother    Diabetes Mother    Stroke Father     Past Medical History:  Diagnosis Date   Atrial fibrillation with normal ventricular rate (HCC)    CHF (congestive heart failure) (HCC)    COPD (chronic obstructive pulmonary disease) (HCC)    Diabetes mellitus without complication (HCC)    Hx of blood clots    Hypertension     Past Surgical History:  Procedure Laterality Date   ENDOSCOPIC RETROGRADE CHOLANGIOPANCREATOGRAPHY (ERCP) WITH PROPOFOL N/A 01/22/2021   Procedure:  ENDOSCOPIC RETROGRADE CHOLANGIOPANCREATOGRAPHY (ERCP) WITH PROPOFOL;  Surgeon: Hilarie Fredrickson, MD;  Location: [facility] ENDOSCOPY;  Service: Endoscopy;  Laterality: N/A;   ESOPHAGOGASTRODUODENOSCOPY (EGD) WITH PROPOFOL N/A 01/28/2021   Procedure: ESOPHAGOGASTRODUODENOSCOPY (EGD) WITH PROPOFOL;  Surgeon: Iva Boop, MD;  Location: [facility] ENDOSCOPY;  Service: Endoscopy;  Laterality: N/A;   IR PERC CHOLECYSTOSTOMY  01/30/2022   REMOVAL OF STONES  01/22/2021   Procedure: REMOVAL OF STONES;  Surgeon: Hilarie Fredrickson, MD;  Location: [facility] ENDOSCOPY;  Service: Endoscopy;;   SPHINCTEROTOMY  01/22/2021   Procedure: Dennison Mascot;  Surgeon: Hilarie Fredrickson, MD;  Location: [facility] ENDOSCOPY;  Service: Endoscopy;;    Social History:  reports that she has quit smoking. Her smoking use included cigarettes. She has never used smokeless tobacco. She reports that she does not drink alcohol and does not use drugs.  Allergies: No Known Allergies  Medications Prior to Admission  Medication Sig Dispense Refill   acetaminophen (TYLENOL) 500 MG tablet Take 500 mg by mouth every 6 (six) hours as needed for mild pain.     albuterol (VENTOLIN HFA) 108 (90 Base) MCG/ACT inhaler Inhale 2 puffs into the lungs every 6 (six) hours as needed for wheezing or shortness of breath.     alendronate (FOSAMAX) 35 MG tablet Take 35 mg by mouth every Monday. Take with a full glass of water on an empty stomach.     bimatoprost (LUMIGAN) 0.03 % ophthalmic solution Place 1 drop into both eyes at bedtime.     brimonidine (ALPHAGAN) 0.2 % ophthalmic solution Place 1 drop into the left eye in the  morning and at bedtime.     busPIRone (BUSPAR) 10 MG tablet Take 10 mg by mouth 2 (two) times daily.     calcium-vitamin D (OSCAL WITH D) 500-200 MG-UNIT per tablet Take 1 tablet by mouth.     dorzolamide (TRUSOPT) 2 % ophthalmic solution Place 1 drop into the left eye 2 (two) times daily.     fluocinonide ointment (LIDEX) 0.05 % Apply 1 Application topically 2 (two)  times daily as needed (scalp irritation).     fluticasone (FLONASE) 50 MCG/ACT nasal spray Place 2 sprays into both nostrils daily as needed for allergies.     furosemide (LASIX) 20 MG tablet Take 1 tablet (20 mg total) by mouth daily.     gabapentin (NEURONTIN) 300 MG capsule Take 300 mg by mouth at bedtime.     magnesium oxide (MAG-OX) 400 (241.3 Mg) MG tablet Take 400 mg by mouth in the morning and at bedtime.     metFORMIN (GLUCOPHAGE) 500 MG tablet Take 500 mg by mouth 2 (two) times daily.     metoprolol succinate (TOPROL-XL) 25 MG 24 hr tablet Take 12.5 mg by mouth daily.     omeprazole (PRILOSEC) 20 MG capsule Take 20 mg by mouth daily.     predniSONE (DELTASONE) 2.5 MG tablet Take 7.5 mg by mouth daily.     silver sulfADIAZINE (SILVADENE) 1 % cream Apply 1 Application topically daily as needed (scalp irritation).     warfarin (COUMADIN) 2 MG tablet On Saturday and Sunday, take 2 tabs (4mg ), then resume normal regimen Take 1 tablet (2 mg) daily except Take 2 tablets (4 mg) on (Tues & Fri) (Patient taking differently: Take 2-4 mg by mouth as directed. Take 2 tablets (4 mg) on (Sun, Mon, Wed, Fri) & Take 1 tablet (2 mg) on (Tues, Thurs, Sat)) 30 tablet 1   sodium chloride flush (NS) 0.9 % SOLN Inject 5 mLs by Intracatheter route daily. 250 mL 1    Prior to Admission medications   Medication Sig Start Date End Date Taking? Authorizing Provider  acetaminophen (TYLENOL) 500 MG tablet Take 500 mg by mouth every 6 (six) hours as needed for mild pain.   Yes [provider]  albuterol (VENTOLIN HFA) 108 (90 Base) MCG/ACT inhaler Inhale 2 puffs into the lungs every 6 (six) hours as needed for wheezing or shortness of breath. 02/02/22  Yes 02/04/22, MD  alendronate (FOSAMAX) 35 MG tablet Take 35 mg by mouth every Monday. Take with a full glass of water on an empty stomach.   Yes [provider]  bimatoprost (LUMIGAN) 0.03 % ophthalmic solution Place 1 drop into both eyes at  bedtime.   Yes [provider]  brimonidine (ALPHAGAN) 0.2 % ophthalmic solution Place 1 drop into the left eye in the morning and at bedtime. 10/21/19  Yes [provider]  busPIRone (BUSPAR) 10 MG tablet Take 10 mg by mouth 2 (two) times daily. 08/04/20  Yes [provider]  calcium-vitamin D (OSCAL WITH D) 500-200 MG-UNIT per tablet Take 1 tablet by mouth.   Yes [provider]  dorzolamide (TRUSOPT) 2 % ophthalmic solution Place 1 drop into the left eye 2 (two) times daily. 08/03/20  Yes [provider]  fluocinonide ointment (LIDEX) 0.05 % Apply 1 Application topically 2 (two) times daily as needed (scalp irritation). 12/27/21  Yes [provider]  fluticasone (FLONASE) 50 MCG/ACT nasal spray Place 2 sprays into both nostrils daily as needed for allergies. 09/27/21  Yes [provider]  furosemide (LASIX) 20 MG tablet Take 1 tablet (20 mg total) by mouth daily. 01/28/21  Yes Hollice Espy, MD  gabapentin (NEURONTIN) 300 MG capsule Take 300 mg by mouth at bedtime. 08/10/20  Yes [provider]  magnesium oxide (MAG-OX) 400 (241.3 Mg) MG tablet Take 400 mg by mouth in the morning and at bedtime. 07/08/20  Yes [provider]  metFORMIN (GLUCOPHAGE) 500 MG tablet Take 500 mg by mouth 2 (two) times daily. 08/24/20  Yes [provider]  metoprolol succinate (TOPROL-XL) 25 MG 24 hr tablet Take 12.5 mg by mouth daily.   Yes [provider]  omeprazole (PRILOSEC) 20 MG capsule Take 20 mg by mouth daily.   Yes [provider]  predniSONE (DELTASONE) 2.5 MG tablet Take 7.5 mg by mouth daily.   Yes [provider]  silver sulfADIAZINE (SILVADENE) 1 % cream Apply 1 Application topically daily as needed (scalp irritation).   Yes [provider]  warfarin (COUMADIN) 2 MG tablet On Saturday and Sunday, take 2 tabs ( ), then resume normal regimen Take 1 tablet (2 mg) daily except Take 2  tablets (4 mg) on (Tues & Fri) Patient taking differently: Take 2-4 mg by mouth as directed. Take 2 tablets (4 mg) on (Sun, Mon, Wed, Fri) & Take 1 tablet (2 mg) on (Tues, [place], Sat) 01/28/21  Yes Hollice Espy, MD  sodium chloride flush (NS) 0.9 % SOLN Inject 5 mLs by Intracatheter route daily. 02/01/22   Han, Aimee H, PA-C    Blood pressure 116/89, pulse (!) 103, temperature 98 F (36.7 C), resp. rate 16, height  (1.575 m), weight 68 kg, SpO2 99 %. Physical Exam: General: elderly female who is laying in bed in NAD Heart: irregular Lungs: CTAB, no wheezes, rhonchi, or rales noted.  Respiratory effort nonlabored Abd: soft, nondistended, mild diffuse tenderness with more moderate RUQ TTP, no peritonitis, no masses, hernias, or organomegaly Skin: warm and dry with no masses, lesions, or rashes  Results for orders placed or performed during the hospital encounter of 03/23/22 (from the past 48 hour(s))  Lipase, blood     Status: None   Collection Time: 03/23/22  2:01 PM  Result Value Ref Range   Lipase 21 11 - 51 U/L    Comment: Performed at [facility], [address] [place], [place] [address]  Comprehensive metabolic panel     Status: Abnormal   Collection Time: 03/23/22  2:01 PM  Result Value Ref Range   Sodium 134 (L) 135 - 145 mmol/L   Potassium 4.1 3.5 - 5.1 mmol/L   Chloride 107 98 - 111 mmol/L   CO2 19 (L) 22 - 32 mmol/L   Glucose, Bld 189 (H) 70 - 99 mg/dL    Comment: Glucose reference range applies only to samples taken after fasting for at least 8 hours.   BUN 16 8 - 23 mg/dL   Creatinine, Ser 6.04 (H) 0.44 - 1.00 mg/dL   Calcium 8.4 (L) 8.9 - 10.3 mg/dL   Total Protein 6.7 6.5 - 8.1 g/dL   Albumin 3.0 (L) 3.5 - 5.0 g/dL   AST 20 15 - 41 U/L   ALT 10 0 - 44 U/L   Alkaline Phosphatase 69 38 - 126 U/L   Total Bilirubin 1.7 (H) 0.3 - 1.2 mg/dL   GFR, Estimated 51 (L) >60 mL/min    Comment: (NOTE) Calculated using the CKD-EPI Creatinine Equation  (2021)  Anion gap 8 5 - 15    Comment: Performed at [facility], [address] [place], [place] [address]  CBC     Status: Abnormal   Collection Time: 03/23/22  2:01 PM  Result Value Ref Range   WBC 14.7 (H) 4.0 - 10.5 K/uL   RBC 4.75 3.87 - 5.11 MIL/uL   Hemoglobin 12.9 12.0 - 15.0 g/dL   HCT 40.0 86.7 - 61.9 %   MCV 83.8 80.0 - 100.0 fL   MCH 27.2 26.0 - 34.0 pg   MCHC 32.4 30.0 - 36.0 g/dL   RDW 50.9 (H) 32.6 - 71.2 %   Platelets 192 150 - 400 K/uL   nRBC 0.0 0.0 - 0.2 %    Comment: Performed at [facility], [address] [place], [place] [address]  Protime-INR     Status: Abnormal   Collection Time: 03/23/22  2:01 PM  Result Value Ref Range   Prothrombin Time 32.3 (H) 11.4 - 15.2 seconds   INR 3.2 (H) 0.8 - 1.2    Comment: (NOTE) INR goal varies based on device and disease states. Performed at [facility], [address] [place], [place] [address]   Lactic acid, plasma     Status: None   Collection Time: 03/23/22  3:12 PM  Result Value Ref Range   Lactic Acid, Venous 1.9 0.5 - 1.9 mmol/L    Comment: Performed at [facility], [address] [place], [place] [address]  Lactic acid, plasma     Status: None   Collection Time: 03/23/22  4:31 PM  Result Value Ref Range   Lactic Acid, Venous 1.5 0.5 - 1.9 mmol/L    Comment: Performed at [facility], [address] [place], [place] [address]  Culture, blood (routine x 2)     Status: None (Preliminary result)   Collection Time: 03/23/22  4:31 PM   Specimen: BLOOD RIGHT FOREARM  Result Value Ref Range   Specimen Description      BLOOD RIGHT FOREARM Performed at [facility], [address] [place], [place] [address]    Special Requests      BOTTLES DRAWN AEROBIC AND ANAEROBIC Blood Culture adequate volume Performed at [facility], [address] [place], [place] [address]    Culture      NO GROWTH < 24 HOURS Performed at [facility], [address], [place], [place] [address]    Report Status PENDING   Urinalysis, Routine w reflex microscopic     Status: Abnormal   Collection Time: 03/23/22  4:45 PM  Result Value Ref Range   Color, Urine YELLOW YELLOW   APPearance CLEAR CLEAR   Specific Gravity, Urine <=1.005 1.005 - 1.030   pH 7.0 5.0 - 8.0   Glucose, UA NEGATIVE NEGATIVE mg/dL   Hgb urine dipstick TRACE (A) NEGATIVE   Bilirubin Urine NEGATIVE NEGATIVE   Ketones, ur NEGATIVE NEGATIVE mg/dL   Protein, ur NEGATIVE NEGATIVE mg/dL   Nitrite NEGATIVE NEGATIVE   Leukocytes,Ua NEGATIVE NEGATIVE    Comment: Performed at [facility], [address] [place], [place] [address]  Urinalysis, Microscopic (reflex)     Status: Abnormal   Collection Time: 03/23/22  4:45 PM  Result Value Ref Range   RBC / HPF 0-5 0 - 5 RBC/hpf   WBC, UA NONE SEEN 0 - 5 WBC/hpf  Bacteria, UA RARE (A) NONE SEEN   Squamous Epithelial / LPF 0-5 0 - 5    Comment: Performed at [facility], [address] [place], [place] [address]  Glucose, capillary     Status: None   Collection Time: 03/23/22  6:38 PM  Result Value Ref Range   Glucose-Capillary 91 70 - 99 mg/dL    Comment: Glucose reference range applies only to samples taken after fasting for at least 8 hours.  Culture, blood (routine x 2)     Status: None (Preliminary result)   Collection Time: 03/23/22  6:49 PM   Specimen: BLOOD  Result Value Ref Range   Specimen Description      BLOOD BLOOD LEFT HAND Performed at [facility], [address], [place], [place] [address]    Special Requests      BOTTLES DRAWN AEROBIC ONLY Blood Culture results may not be optimal due to an inadequate volume of blood received in culture bottles Performed at [facility], [address], [place], [place] [address]    Culture      NO GROWTH < 12 HOURS Performed at [facility], [address] [place], [place] [address]     Report Status PENDING   CBC     Status: Abnormal   Collection Time: 03/24/22  5:56 AM  Result Value Ref Range   WBC 17.4 (H) 4.0 - 10.5 K/uL   RBC 4.63 3.87 - 5.11 MIL/uL   Hemoglobin 12.9 12.0 - 15.0 g/dL   HCT 64.3 32.9 - 51.8 %   MCV 86.6 80.0 - 100.0 fL   MCH 27.9 26.0 - 34.0 pg   MCHC 32.2 30.0 - 36.0 g/dL   RDW 84.1 (H) 66.0 - 63.0 %   Platelets 176 150 - 400 K/uL   nRBC 0.0 0.0 - 0.2 %    Comment: Performed at [facility], [address] [place], [place] [address]  Basic metabolic panel     Status: Abnormal   Collection Time: 03/24/22  5:56 AM  Result Value Ref Range   Sodium 136 135 - 145 mmol/L   Potassium 4.3 3.5 - 5.1 mmol/L   Chloride 110 98 - 111 mmol/L   CO2 21 (L) 22 - 32 mmol/L   Glucose, Bld 92 70 - 99 mg/dL    Comment: Glucose reference range applies only to samples taken after fasting for at least 8 hours.   BUN 15 8 - 23 mg/dL   Creatinine, Ser 9.32 0.44 - 1.00 mg/dL   Calcium 8.2 (L) 8.9 - 10.3 mg/dL   GFR, Estimated 58 (L) >60 mL/min    Comment: (NOTE) Calculated using the CKD-EPI Creatinine Equation (2021)    Anion gap 5 5 - 15    Comment: Performed at [facility], [address], [place], [place] [address]  Protime-INR     Status: Abnormal   Collection Time: 03/24/22  5:56 AM  Result Value Ref Range   Prothrombin Time 27.4 (H) 11.4 - 15.2 seconds   INR 2.6 (H) 0.8 - 1.2    Comment: (NOTE) INR goal varies based on device and disease states. Performed at [facility], [address], [place], [place] [address]   Glucose, capillary     Status: None   Collection Time: 03/24/22  7:51 AM  Result Value Ref Range   Glucose-Capillary 81 70 - 99 mg/dL    Comment: Glucose reference range  applies only to samples taken after fasting for at least 8 hours.   CT ABDOMEN PELVIS W CONTRAST  Result Date: 03/23/2022 CLINICAL DATA:  Abdominal pain x2 days EXAM: CT ABDOMEN AND PELVIS WITH CONTRAST  TECHNIQUE: Multidetector CT imaging of the abdomen and pelvis was performed using the standard protocol following bolus administration of intravenous contrast. RADIATION DOSE REDUCTION: This exam was performed according to the departmental dose-optimization program which includes automated exposure control, adjustment of the mA and/or kV according to patient size and/or use of iterative reconstruction technique. CONTRAST:  100mL OMNIPAQUE IOHEXOL 300 MG/ML  SOLN COMPARISON:  01/20/2021 FINDINGS: Lower chest: Heart is enlarged in size. Visualized lower lung fields are clear. Hepatobiliary: Air is seen in the intrahepatic and extrahepatic bile ducts. This may suggest a previous sphincterotomy. There is fluid around the gallbladder. There is inflammatory stranding in the fat planes immediately behind the right rectus muscle close to the fundus of the gallbladder. There are pockets of air in the gallbladder and pericholecystic region. Clinical history suggests that the patient had placement of cholecystostomy tube which has fallen out. Air in and adjacent to the gallbladder may be related to recently removed cholecystostomy tube all suggest emphysematous cholecystitis. There is no loculated thick-walled fluid collection adjacent to the gallbladder. Pancreas: No focal abnormalities are seen. Spleen: Unremarkable. Adrenals/Urinary Tract: Adrenals are unremarkable. 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 There is no hydronephrosis. There are few low-density lesions in kidneys, possibly cysts. There are no renal or ureteral stones. Urinary bladder is unremarkable. Stomach/Bowel: Small hiatal hernia is seen. There is no significant dilation of small bowel loops. The appendix is unremarkable. Scattered diverticula are seen in colon without signs of focal diverticulitis. Vascular/Lymphatic: Atherosclerotic plaques and calcifications are seen in aorta and its major branches. 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 Inferior vena caval filter is seen. Reproductive: Unremarkable. Other: There is no ascites or significant pneumoperitoneum. Umbilical hernia containing fat is seen. Musculoskeletal: There is minimal anterolisthesis at the L4-L5 level. There is mild to moderate spinal stenosis at the L4-L5 level. There is encroachment of neural foramina at multiple levels. IMPRESSION: There is fluid around the gallbladder. There are pockets of air in and adjacent to the gallbladder. Clinical history suggests recent placement of cholecystostomy catheter which may have fallen out. These changes could be related to recent placement of cholecystostomy catheter. Another diagnostic possibility would be emphysematous cholecystitis. There is stranding in the fat planes adjacent to the fundus of the gallbladder without any thick-walled loculated fluid collections. Air in the lumen of bile ducts may suggest previous sphincterotomy. There is no evidence of intestinal obstruction or pneumoperitoneum. There is no hydronephrosis. Diverticulosis of colon without focal diverticulitis. Other findings as described in the body of the report. Imaging finding of air in and around the gallbladder was discussed with Dr. Jacqulyn BathLong by telephone call. Electronically Signed   By: Ernie AvenaPalani  Rathinasamy M.D.   On: 03/23/2022 15:29    Anti-infectives (From admission, onward)    Start     Dose/Rate Route Frequency Ordered Stop   03/23/22 2000  piperacillin-tazobactam (ZOSYN) IVPB 3.375 g        3.375 g 12.5 mL/hr over 240 Minutes Intravenous Every 8 hours 03/23/22 1837     03/23/22 1800  piperacillin-tazobactam (ZOSYN) IVPB 3.375 g  Status:  Discontinued        3.375 g 100 mL/hr over 30 Minutes Intravenous Every 6 hours 03/23/22 1540 03/23/22 1838  Assessment/Plan Acute cholecystitis  - h/o percutaneous cholecystostomy 01/30/22 that fell out 03/11/22 - Returned to the ED with recurrent cholecystitis. WBC is elevated and she is tender in the  RUQ on exam. - Given anticoagulation and tachy/brady possibly needing a pacemaker, will ask IR to replace percutaneous cholecystostomy tube once INR normalizes. Continue IV zosyn.  ID - zosyn 9/7>> VTE - hold coumadin, INR 2.6 FEN - NPO Foley - none  Permanent A fib with tachy/brady Hx DVT on coumadin Chronic diastolic CHF Pemphigus on steroids DM COPD HTN PAD  I reviewed hospitalist notes, last 24 h vitals and pain scores, last 48 h intake and output, last 24 h labs and trends, and last 24 h imaging results.   Franne Forts, PA-C [facility] Surgery 03/24/2022, 10:30 AM Please see Amion for pager number during day hours 7:00am-4:30pm

## 2022-03-24 NOTE — Progress Notes (Signed)
Referring Physician(s): Jettie Pagan PA-C  Supervising Physician: Malachy Moan  Patient Status:  [facility] - In-pt  Chief Complaint:  cholecystitis  Subjective: Patient  familiar to IR service from percutaneous cholecystostomy on 01/30/2022.  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  She has a prior history of choledocholithiasis requiring ERCP on 01/22/2022 and refused lap chole.She returned to the ED on 7/16 with acute cholecystitis and ultimately was treated with a percutaneous gallbladder drain on 7/17.  This tube fell out on 03/11/2022 and decision was made to leave the tube out  until she followed up with surgery.  She has now developed worsening abdominal pain, nausea, vomiting 3 days ago prompting her to go to the ED.  CT abdomen pelvis performed yesterday revealed:   There is fluid around the gallbladder. There are pockets of air in and adjacent to the gallbladder. Clinical history suggests recent placement of cholecystostomy catheter which may have fallen out. These changes could be related to recent placement of cholecystostomy catheter. Another diagnostic possibility would be emphysematous cholecystitis. There is stranding in the fat planes adjacent to the fundus of the gallbladder without any thick-walled loculated fluid collections. Air in the lumen of bile ducts may suggest previous sphincterotomy.   There is no evidence of intestinal obstruction or pneumoperitoneum. There is no hydronephrosis. Diverticulosis of colon without focal diverticulitis.   She is afebrile, WBC 17.4, hemoglobin normal, platelets normal, creatinine normal PT 27.4, INR 2.6, blood cultures pending; previous bile cultures grew Enterobacter, Proteus and  Bacteroides; she is on IV Zosyn; request now received from surgery for gallbladder drain replacement.  She currently denies fever, headache worsening respiratory issues, nausea, vomiting or bleeding.  She does have some mild abdominal discomfort.  Past Medical History:  Diagnosis Date   Atrial fibrillation with normal ventricular rate (HCC)    CHF (congestive heart failure) (HCC)    COPD (chronic obstructive pulmonary disease) (HCC)    Diabetes mellitus without complication (HCC)    Hx of blood clots    Hypertension    Past Surgical History:  Procedure Laterality Date   ENDOSCOPIC RETROGRADE CHOLANGIOPANCREATOGRAPHY (ERCP) WITH PROPOFOL N/A 01/22/2021   Procedure: ENDOSCOPIC RETROGRADE CHOLANGIOPANCREATOGRAPHY (ERCP) WITH PROPOFOL;  Surgeon: Hilarie Fredrickson, MD;  Location: [facility] ENDOSCOPY;  Service: Endoscopy;  Laterality: N/A;   ESOPHAGOGASTRODUODENOSCOPY (EGD) WITH PROPOFOL N/A 01/28/2021   Procedure: ESOPHAGOGASTRODUODENOSCOPY (EGD) WITH PROPOFOL;  Surgeon: Iva Boop, MD;  Location: [facility] ENDOSCOPY;  Service: Endoscopy;  Laterality: N/A;   IR PERC CHOLECYSTOSTOMY  01/30/2022   REMOVAL OF STONES  01/22/2021   Procedure: REMOVAL OF STONES;  Surgeon: Hilarie Fredrickson, MD;  Location: [facility] ENDOSCOPY;  Service: Endoscopy;;   SPHINCTEROTOMY  01/22/2021   Procedure: Dennison Mascot;  Surgeon: Hilarie Fredrickson, MD;  Location: [facility] ENDOSCOPY;  Service: Endoscopy;;     Allergies: Patient has no known allergies.  Medications: Prior to Admission medications   Medication Sig Start Date End Date Taking? Authorizing Provider  acetaminophen (TYLENOL) 500 MG tablet Take 500 mg by mouth every 6 (six) hours as needed for mild pain.   Yes [provider]  albuterol (VENTOLIN HFA) 108 (90 Base) MCG/ACT inhaler Inhale 2 puffs into the lungs every 6 (six) hours as needed for wheezing or shortness of breath. 02/02/22  Yes Almon Hercules, MD  alendronate (FOSAMAX) 35 MG tablet Take 35 mg by mouth every Monday. Take with  a full glass of water on an empty stomach.   Yes [provider]  bimatoprost (LUMIGAN) 0.03 % ophthalmic solution Place 1 drop into both eyes at bedtime.   Yes [provider]  brimonidine (ALPHAGAN) 0.2 % ophthalmic solution Place 1 drop into the left eye in the morning and at bedtime. 10/21/19  Yes [provider]  busPIRone (BUSPAR) 10 MG tablet Take 10 mg by mouth 2 (two) times daily. 08/04/20  Yes [provider]  calcium-vitamin D (OSCAL WITH D) 500-200 MG-UNIT per tablet Take 1 tablet by mouth.   Yes [provider]  dorzolamide (TRUSOPT) 2 % ophthalmic solution Place 1 drop into the left eye 2 (two) times daily. 08/03/20  Yes [provider]  fluocinonide ointment (LIDEX) [id number] % Apply 1 Application topically 2 (two) times daily as needed (scalp irritation). 12/27/21  Yes [provider]  fluticasone (FLONASE) 50 MCG/ACT nasal spray Place 2 sprays into both nostrils daily as needed for allergies. 09/27/21  Yes [provider]  furosemide (LASIX) 20 MG tablet Take 1 tablet (20 mg total) by mouth daily. 01/28/21  Yes Annita Brod, MD  gabapentin (NEURONTIN) 300 MG capsule Take 300 mg by mouth at bedtime. 08/10/20  Yes [provider]  magnesium oxide (MAG-OX) 400 (241.3 Mg) MG tablet Take 400 mg by mouth in the morning and at bedtime. 07/08/20  Yes [provider]  metFORMIN (GLUCOPHAGE) 500 MG tablet Take 500 mg by mouth 2 (two) times daily. 08/24/20  Yes [provider]  metoprolol succinate (TOPROL-XL) 25 MG 24 hr tablet Take 12.5 mg by mouth daily.   Yes [provider]  omeprazole (PRILOSEC) 20 MG capsule Take 20 mg by mouth daily.   Yes [provider]  predniSONE (DELTASONE) 2.5 MG tablet Take 7.5 mg by mouth daily.   Yes [provider]  silver sulfADIAZINE (SILVADENE) 1 % cream Apply 1 Application topically daily as needed (scalp irritation).   Yes [provider]  warfarin (COUMADIN) 2 MG tablet On Saturday and Sunday, take 2 tabs (4mg ), then resume normal regimen Take 1 tablet (2 mg) daily except Take 2 tablets (4 mg) on (Tues & Fri) Patient taking differently: Take 2-4 mg by mouth as directed. Take 2 tablets (4 mg) on (Sun, Mon, Wed, Fri) & Take 1 tablet (2 mg) on (Tues, [place], Sat) 01/28/21  Yes Annita Brod, MD  sodium chloride flush (NS) 0.9 % SOLN Inject 5 mLs by Intracatheter route daily. 02/01/22   Han, Aimee H, PA-C     Vital Signs: BP 103/67 (BP Location: Left Arm)   Pulse (!) 101   Temp 98 F (36.7 C) (Oral)   Resp 16   Ht 5\' 2"  (1.575 m)   Wt 150 lb (68 kg)   SpO2 95%   BMI 27.44 kg/m   Physical Exam awake, alert.  Chest clear to auscultation bilaterally.  Heart with irreg irreg rhythm, sl tachy; abdomen soft, positive bowel sounds, mild generalized tenderness more so right upper quadrant.  No lower extremity edema.  Imaging: CT ABDOMEN PELVIS W CONTRAST  Result Date: 03/23/2022 CLINICAL DATA:  Abdominal pain x2 days EXAM: CT ABDOMEN AND PELVIS WITH CONTRAST TECHNIQUE: Multidetector CT imaging of the abdomen and pelvis was performed using the standard protocol following bolus administration of intravenous contrast. RADIATION DOSE REDUCTION: This exam was performed according to the departmental dose-optimization  program which includes automated exposure control, adjustment of the mA and/or kV according to patient size and/or use of iterative reconstruction technique. CONTRAST:  OMNIPAQUE IOHEXOL 300 MG/ML  SOLN COMPARISON:  01/20/2021 FINDINGS: Lower chest: Heart is enlarged in size. Visualized lower lung fields are clear. Hepatobiliary: Air is seen in the intrahepatic and extrahepatic bile ducts. This may suggest a previous sphincterotomy. There is fluid around the gallbladder. There is inflammatory stranding in the fat planes immediately behind the right rectus muscle close to the fundus of the gallbladder. There  are pockets of air in the gallbladder and pericholecystic region. Clinical history suggests that the patient had placement of cholecystostomy tube which has fallen out. Air in and adjacent to the gallbladder may be related to recently removed cholecystostomy tube all suggest emphysematous cholecystitis. There is no loculated thick-walled fluid collection adjacent to the gallbladder. Pancreas: No focal abnormalities are seen. Spleen: Unremarkable. Adrenals/Urinary Tract: Adrenals are unremarkable. 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 There is no hydronephrosis. There are few low-density lesions in kidneys, possibly cysts. There are no renal or ureteral stones. Urinary bladder is unremarkable. Stomach/Bowel: Small hiatal hernia is seen. There is no significant dilation of small bowel loops. The appendix is unremarkable. Scattered diverticula are seen in colon without signs of focal diverticulitis. Vascular/Lymphatic: Atherosclerotic plaques and calcifications are seen in aorta and its major branches. 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 Reproductive: Unremarkable. Other: There is no ascites or significant pneumoperitoneum. Umbilical hernia containing fat is seen. Musculoskeletal: There is minimal anterolisthesis at the L4-L5 level. There is mild to moderate spinal stenosis at the L4-L5 level. There is encroachment of neural foramina at multiple levels. IMPRESSION: There is fluid around the gallbladder. There are pockets of air in and adjacent to the gallbladder. Clinical history suggests recent placement of cholecystostomy catheter which may have fallen out. These changes could be related to recent placement of cholecystostomy catheter. Another diagnostic possibility would be emphysematous cholecystitis. There is stranding in the fat planes adjacent to the fundus of the gallbladder without any thick-walled loculated fluid collections. Air in the lumen of  bile ducts may suggest previous sphincterotomy. There is no evidence of intestinal obstruction or pneumoperitoneum. There is no hydronephrosis. Diverticulosis of colon without focal diverticulitis. Other findings as described in the body of the report. Imaging finding of air in and around the gallbladder was discussed with Dr. Jacqulyn Bath by telephone call. Electronically Signed   By: Ernie Avena M.D.   On: 03/23/2022 15:29    Labs:  CBC: Recent Labs    02/01/22 0243 02/10/22 1311 03/23/22 1401 03/24/22 0556  WBC 10.5 6.6 14.7* 17.4*  HGB 11.5* 13.3 12.9 12.9  HCT 36.3 42.4 39.8 40.1  PLT 167 335 192 176    COAGS: Recent Labs    02/02/22 0322 02/10/22 1610 03/23/22 1401 03/24/22 0556  INR 2.5* 3.2* 3.2* 2.6*    BMP: Recent Labs    02/01/22 0243 02/10/22 1311 03/23/22 1401 03/24/22 0556  NA 139 137 134* 136  K 4.5 4.0 4.1 4.3  CL 111 108 107 110  CO2 20* 20* 19* 21*  GLUCOSE 112* 92 189* 92  BUN 22 16 16 15   CALCIUM 9.0 9.1 8.4* 8.2*  CREATININE 0.91 1.06* 1.10* 0.99  GFRNONAA >60 53* 51* 58*    LIVER FUNCTION TESTS: Recent Labs    01/29/22 0500 01/30/22 0313 02/01/22 0243 02/10/22 1311 03/23/22 1401  BILITOT  2.1* 2.2*  --  0.4 1.7*  AST 16 15  --  20 20  ALT 16 14  --  16 10  ALKPHOS 79 71  --  54 69  PROT 7.7 7.6  --  6.6 6.7  ALBUMIN 3.4* 3.3* 2.7* 2.9* 3.0*    Assessment and Plan: Patient  familiar to IR service from percutaneous cholecystostomy on 01/30/2022.  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  She has a prior history of choledocholithiasis requiring ERCP on 01/22/2022 and refused lap chole.She returned to the ED on 7/16 with acute cholecystitis and ultimately was treated with a percutaneous gallbladder  drain on 7/17.  This tube fell out on 03/11/2022 and decision was made to leave the tube out  until she followed up with surgery.  She has now developed worsening abdominal pain, nausea, vomiting 3 days ago prompting her to go to the ED.  CT abdomen pelvis performed yesterday revealed:   There is fluid around the gallbladder. There are pockets of air in and adjacent to the gallbladder. Clinical history suggests recent placement of cholecystostomy catheter which may have fallen out. These changes could be related to recent placement of cholecystostomy catheter. Another diagnostic possibility would be emphysematous cholecystitis. There is stranding in the fat planes adjacent to the fundus of the gallbladder without any thick-walled loculated fluid collections. Air in the lumen of bile ducts may suggest previous sphincterotomy.   There is no evidence of intestinal obstruction or pneumoperitoneum. There is no hydronephrosis. Diverticulosis of colon without focal diverticulitis.   She is afebrile, WBC 17.4, hemoglobin normal, platelets normal, creatinine normal ,PT 27.4, INR 2.6, blood cultures pending; previous bile cultures grew Enterobacter, Proteus and Bacteroides; she is on IV Zosyn; request now received from surgery for gallbladder drain replacement.  Imaging studies were reviewed by Dr. Laurence Ferrari. Details/risks of procedure, including but not limited to, internal bleeding, infection, injury to adjacent structures, discussed with patient with her understanding and consent.We will plan procedure once INR less than or equal to 1.5.   Electronically Signed: D. Rowe Robert, PA-C 03/24/2022, 4:20 PM   I spent a total of 25 minutes at the the patient's bedside AND on the patient's hospital floor or unit, greater than 50% of which was counseling/coordinating care for percutaneous cholecystostomy    Patient ID: Jill Crane, female   DOB: 11-Jan-1941, 81 y.o.   MRN: [id number]

## 2022-03-24 NOTE — Progress Notes (Signed)
PHARMACY - PHYSICIAN COMMUNICATION CRITICAL VALUE ALERT - BLOOD CULTURE IDENTIFICATION (BCID)  Jill Crane is an 81 y.o. female who presented to [facility] on 03/23/2022 with a chief complaint of abdominal pain found to have acute on chronic cholecystitis had percutaneous cholecystostomy tube placement on 7/17 but then fell out 8/28  Assessment:  1 of 3 blood culture bottles growing staph epi with no resistance, presumed contaminant  Name of physician (or Provider) ContactedBlake Divine  Current antibiotics: Zosyn  Changes to prescribed antibiotics recommended:  No changes  Results for orders placed or performed during the hospital encounter of 08/15/20  Blood Culture ID Panel (Reflexed) (Collected: 08/15/2020  4:22 AM)  Result Value Ref Range   Enterococcus faecalis NOT DETECTED NOT DETECTED   Enterococcus Faecium NOT DETECTED NOT DETECTED   Listeria monocytogenes NOT DETECTED NOT DETECTED   Staphylococcus species NOT DETECTED NOT DETECTED   Staphylococcus aureus (BCID) NOT DETECTED NOT DETECTED   Staphylococcus epidermidis NOT DETECTED NOT DETECTED   Staphylococcus lugdunensis NOT DETECTED NOT DETECTED   Streptococcus species NOT DETECTED NOT DETECTED   Streptococcus agalactiae NOT DETECTED NOT DETECTED   Streptococcus pneumoniae NOT DETECTED NOT DETECTED   Streptococcus pyogenes NOT DETECTED NOT DETECTED   A.calcoaceticus-baumannii NOT DETECTED NOT DETECTED   Bacteroides fragilis NOT DETECTED NOT DETECTED   Enterobacterales DETECTED (A) NOT DETECTED   Enterobacter cloacae complex NOT DETECTED NOT DETECTED   Escherichia coli DETECTED (A) NOT DETECTED   Klebsiella aerogenes NOT DETECTED NOT DETECTED   Klebsiella oxytoca NOT DETECTED NOT DETECTED   Klebsiella pneumoniae NOT DETECTED NOT DETECTED   Proteus species NOT DETECTED NOT DETECTED   Salmonella species NOT DETECTED NOT DETECTED   Serratia marcescens NOT DETECTED NOT DETECTED   Haemophilus influenzae NOT DETECTED NOT DETECTED    Neisseria meningitidis NOT DETECTED NOT DETECTED   Pseudomonas aeruginosa NOT DETECTED NOT DETECTED   Stenotrophomonas maltophilia NOT DETECTED NOT DETECTED   Candida albicans NOT DETECTED NOT DETECTED   Candida auris NOT DETECTED NOT DETECTED   Candida glabrata NOT DETECTED NOT DETECTED   Candida krusei NOT DETECTED NOT DETECTED   Candida parapsilosis NOT DETECTED NOT DETECTED   Candida tropicalis NOT DETECTED NOT DETECTED   Cryptococcus neoformans/gattii NOT DETECTED NOT DETECTED   CTX-M ESBL NOT DETECTED NOT DETECTED   Carbapenem resistance IMP NOT DETECTED NOT DETECTED   Carbapenem resistance KPC NOT DETECTED NOT DETECTED   Carbapenem resistance NDM NOT DETECTED NOT DETECTED   Carbapenem resist OXA 48 LIKE NOT DETECTED NOT DETECTED   Carbapenem resistance VIM NOT DETECTED NOT DETECTED    Berkley Harvey 03/24/2022  3:37 PM

## 2022-03-24 NOTE — Consult Note (Addendum)
Cardiology Consultation   Patient ID: Jill Crane MRN: [id number]; DOB: Apr 30, 1941  Admit date: 03/23/2022 Date of Consult: 03/24/2022  PCP:  Zoila Shutter, MD   [facility] Providers Cardiologist:  None       Followed by [facility] cards Patient Profile:   Jill Crane is a 81 y.o. female with a hx of Permanent AF, tachy/brady syndrome, anticoagulation on coumadin plan for leadless pacemaker, HFpEF, reactive airway disease, DM2, HTN, PAD, hx DVT and hx of IVC filter, hx of GI bleed  who is being seen 03/24/2022 for the evaluation of pre-op eval at the request of Dr. Blake Divine.  History of Present Illness:   [name] with hx of PAF with Tachy/Brady making it difficult to treat a fib.  Recent admit for dizziness and HR in 30s.  Recent echo with EF 60-65% not a candidate for ablation.  She is not very active.  Was in atrial fib at 107 on 02/24/22.  Reported hx of septal infarct before 04/2018 but she denies and no record that I can find.  She has not decided about PPM-wireless.  And with acute cholecystitis issues would prefer to wait to prevent infection   In 2022 pt with stone in her bile duct and underwent ERCP for removal. She refused cholecystectomy.  In July 2023 had cholecystitis and underwent percutaneous cholecystostomy tube, she improved and then drain fell out.    Pt now presented to ER yesterday afternoon with abd pain.  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  No loculated fluid collection. Admitted for acute on chronic cholecystitis.  Plan for either lap chole vs replacement of her cholecystostomy tube.  On IV zosyn.   In ER BP low and given IV fluids.  Coumadin on hold and INR 2.6 this AM was 3.2 on admit.   EKG:  The EKG was personally reviewed and demonstrates:  atrial fib with rate 104 no acute ST changes Telemetry:  Telemetry was personally reviewed and demonstrates:  atrial fib HR  to 167 at times.   (Cr 1.17 11/2021 and 1.04)  Na 136 K+ 4.3 BUN 15, Cr 0.99  Albumin 3.0  WBC today 17.4 up from 14.7  Hgb 12.9 Plts 176  Blood cultures pending  BP 116/89 P 120?  R 30 afebrile  No chest pain, no SOB.  She does some housework but not much due to leg pain.     Past Medical History:  Diagnosis Date   Atrial fibrillation with normal ventricular rate (HCC)    CHF (congestive heart failure) (HCC)    COPD (chronic obstructive pulmonary disease) (HCC)    Diabetes mellitus without complication (HCC)    Hx of blood clots    Hypertension     Past Surgical History:  Procedure Laterality Date   ENDOSCOPIC RETROGRADE CHOLANGIOPANCREATOGRAPHY (ERCP) WITH PROPOFOL N/A 01/22/2021   Procedure: ENDOSCOPIC RETROGRADE CHOLANGIOPANCREATOGRAPHY (ERCP) WITH PROPOFOL;  Surgeon: Hilarie Fredrickson, MD;  Location: [facility] ENDOSCOPY;  Service: Endoscopy;  Laterality: N/A;   ESOPHAGOGASTRODUODENOSCOPY (EGD) WITH PROPOFOL N/A 01/28/2021   Procedure: ESOPHAGOGASTRODUODENOSCOPY (EGD) WITH PROPOFOL;  Surgeon: Iva Boop, MD;  Location: [facility] ENDOSCOPY;  Service: Endoscopy;  Laterality: N/A;   IR PERC CHOLECYSTOSTOMY  01/30/2022   REMOVAL OF STONES  01/22/2021   Procedure: REMOVAL OF STONES;  Surgeon: Hilarie Fredrickson, MD;  Location: [facility] ENDOSCOPY;  Service: Endoscopy;;   SPHINCTEROTOMY  01/22/2021   Procedure: Dennison Mascot;  Surgeon: Yancey Flemings  N, MD;  Location: [facility] ENDOSCOPY;  Service: Endoscopy;;     Home Medications:  Prior to Admission medications   Medication Sig Start Date End Date Taking? Authorizing Provider  acetaminophen (TYLENOL) 500 MG tablet Take 500 mg by mouth every 6 (six) hours as needed for mild pain.   Yes [provider]  albuterol (VENTOLIN HFA) 108 (90 Base) MCG/ACT inhaler Inhale 2 puffs into the lungs every 6 (six) hours as needed for wheezing or shortness of breath. 02/02/22  Yes Almon HerculesGonfa, Taye T, MD  alendronate (FOSAMAX) 35 MG tablet Take 35 mg by mouth every Monday. Take with  a full glass of water on an empty stomach.   Yes [provider]  bimatoprost (LUMIGAN) 0.03 % ophthalmic solution Place 1 drop into both eyes at bedtime.   Yes [provider]  brimonidine (ALPHAGAN) 0.2 % ophthalmic solution Place 1 drop into the left eye in the morning and at bedtime. 10/21/19  Yes [provider]  busPIRone (BUSPAR) 10 MG tablet Take 10 mg by mouth 2 (two) times daily. 08/04/20  Yes [provider]  calcium-vitamin D (OSCAL WITH D) 500-200 MG-UNIT per tablet Take 1 tablet by mouth.   Yes [provider]  dorzolamide (TRUSOPT) 2 % ophthalmic solution Place 1 drop into the left eye 2 (two) times daily. 08/03/20  Yes [provider]  fluocinonide ointment (LIDEX) 0.05 % Apply 1 Application topically 2 (two) times daily as needed (scalp irritation). 12/27/21  Yes [provider]  fluticasone (FLONASE) 50 MCG/ACT nasal spray Place 2 sprays into both nostrils daily as needed for allergies. 09/27/21  Yes [provider]  furosemide (LASIX) 20 MG tablet Take 1 tablet (20 mg total) by mouth daily. 01/28/21  Yes Hollice EspyKrishnan, Sendil K, MD  gabapentin (NEURONTIN) 300 MG capsule Take 300 mg by mouth at bedtime. 08/10/20  Yes [provider]  magnesium oxide (MAG-OX) 400 (241.3 Mg) MG tablet Take 400 mg by mouth in the morning and at bedtime. 07/08/20  Yes [provider]  metFORMIN (GLUCOPHAGE) 500 MG tablet Take 500 mg by mouth 2 (two) times daily. 08/24/20  Yes [provider]  metoprolol succinate (TOPROL-XL) 25 MG 24 hr tablet Take 12.5 mg by mouth daily.   Yes [provider]  omeprazole (PRILOSEC) 20 MG capsule Take 20 mg by mouth daily.   Yes [provider]  predniSONE (DELTASONE) 2.5 MG tablet Take 7.5 mg by mouth daily.   Yes [provider]  silver sulfADIAZINE (SILVADENE) 1 % cream Apply 1 Application topically daily as needed (scalp irritation).   Yes [provider]  warfarin (COUMADIN) 2 MG tablet On Saturday and Sunday, take 2 tabs (4mg ), then resume normal regimen Take 1 tablet (2 mg) daily except Take 2 tablets (4 mg) on (Tues & Fri) Patient taking differently: Take 2-4 mg by mouth as directed. Take 2 tablets (4 mg) on (Sun, Mon, Wed, Fri) & Take 1 tablet (2 mg) on (Tues, [place], Sat) 01/28/21  Yes Hollice EspyKrishnan, Sendil K, MD  sodium chloride flush (NS) 0.9 % SOLN Inject 5 mLs by Intracatheter route daily. 02/01/22   Han, Aimee H, PA-C    Inpatient Medications: Scheduled Meds:  brimonidine  1 drop Left Eye BID   busPIRone  10 mg Oral BID   calcium-vitamin D  1 tablet Oral BID   dorzolamide  1 drop Left Eye BID   furosemide  20 mg Oral Daily   insulin aspart  0-9 Units  Subcutaneous TID WC   latanoprost  1 drop Both Eyes QHS   lidocaine  1 patch Transdermal Q24H   magnesium oxide  400 mg Oral BID   metoprolol succinate  12.5 mg Oral Daily   pantoprazole  40 mg Oral Daily   predniSONE  7.5 mg Oral Daily   Continuous Infusions:  piperacillin-tazobactam (ZOSYN)  IV 3.375 g (03/24/22 0308)   PRN Meds: gabapentin, morphine injection  Allergies:   No Known Allergies  Social History:   Social History   Socioeconomic History   Marital status: Widowed    Spouse name: Not on file   Number of children: Not on file   Years of education: Not on file   Highest education level: Not on file  Occupational History   Not on file  Tobacco Use   Smoking status: Former    Types: Cigarettes   Smokeless tobacco: Never  Vaping Use   Vaping Use: Never used  Substance and Sexual Activity   Alcohol use: No   Drug use: Never   Sexual activity: Not on file  Other Topics Concern   Not on file  Social History Narrative   Not on file   Social Determinants of Health   Financial Resource Strain: Not on file  Food Insecurity: Not on file  Transportation Needs: Not on file  Physical Activity: Not on file  Stress: Not on file  Social  Connections: Not on file  Intimate Partner Violence: Not on file    Family History:    Family History  Problem Relation Age of Onset   Stroke Mother    Diabetes Mother    Stroke Father      ROS:  Please see the history of present illness.  General:no colds or fevers, no weight changes Skin:no rashes or ulcers HEENT:no blurred vision, no congestion CV:see HPI PUL:see HPI GI:no diarrhea constipation or melena, no indigestion GU:no hematuria, no dysuria MS:no joint pain, + claudication Neuro:no syncope, occ lightheadedness Endo:+ diabetes, no thyroid disease  All other ROS reviewed and negative.     Physical Exam/Data:   Vitals:   03/23/22 2004 03/23/22 2233 03/24/22 0246 03/24/22 0643  BP: 111/69 (!) 137/59 102/68 116/89  Pulse: 68 (!) 107 76 (!) 103  Resp: 16 18 18 16   Temp: 98.7 F (37.1 C) 98.4 F (36.9 C) 98.4 F (36.9 C) 98 F (36.7 C)  TempSrc: Oral  Oral   SpO2: 98% 98% 100% 99%  Weight:      Height:       No intake or output data in the 24 hours ending 03/24/22 0919    03/23/2022    1:39 PM 02/10/2022   12:37 PM 02/10/2022   11:16 AM  Last 3 Weights  Weight (lbs) 150 lb 150 lb 150 lb  Weight (kg) 68.04 kg 68.04 kg 68.04 kg     Body mass index is 27.44 kg/m.  General:  Well nourished, well developed, in no acute distress  HEENT: normal Neck: no JVD Vascular: No carotid bruits; Distal pulses ? Pedal pulses bilaterally Cardiac:  irreg irreg; no murmur gallup rub or click Lungs:  clear to auscultation bilaterally, no wheezing, rhonchi or rales  Abd: soft, nontender, no hepatomegaly  Ext: no edema Musculoskeletal:  No deformities, BUE and BLE strength normal and equal Skin: warm and dry  Neuro:  alert and oriented X 3 MAE follows commands, no focal abnormalities noted Psych:  Normal affect    Relevant CV Studies: Per records carotid  duplex  Right ICA 40 to 59% stenosis with vertebral flow which is antegrade. Left ICA with 60 to 79% stenosis with  vertebral flow that is antegrade.  CTA of ABD CTA of the abdomen pelvis with lower extremity runoff, she has diffuse atherosclerotic disease throughout her aortoiliac segment, she does have significant stenosis of the left external iliac artery at the takeoff of the hypogastric artery. 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  Echo 12/08/21 SUMMARY  Mild left ventricular hypertrophy  Left ventricular systolic function is normal.  LV ejection fraction = 60-65%.  There is mild tricuspid regurgitation.  No pulmonary hypertension.  There is no pericardial effusion.  Compared to the last study 2014, [facility] is noted   -  FINDINGS:  LEFT VENTRICLE  The left ventricular size is normal. Mild left ventricular  hypertrophy. Left ventricular systolic function is normal. LV ejection  fraction = 60-65%. The left ventricular wall motion is normal.   -  RIGHT VENTRICLE  The right ventricle is normal in size and function.   LEFT ATRIUM  The left atrial size is normal.   RIGHT ATRIUM  Right atrial size is normal.  -  AORTIC VALVE  There is trivial aortic valve thickening. There is no aortic stenosis.  There is no aortic regurgitation.  -  MITRAL VALVE  The mitral valve is normal in structure and function. There is no  mitral regurgitation noted.  -  TRICUSPID VALVE  There is trivial tricuspid valve thickening. There is mild tricuspid  regurgitation. No pulmonary hypertension.  -  PULMONIC VALVE  There is trivial pulmonic valve thickening. Mild pulmonic valvular  regurgitation. There is no pulmonic valvular stenosis.  -  ARTERIES  The aortic sinus is normal size.  -  VENOUS  IVC size was normal.  -  Laboratory Data:  High Sensitivity Troponin:  No results for input(s): "TROPONINIHS" in the last 720 hours.   Chemistry Recent Labs  Lab 03/23/22 1401 03/24/22 0556  NA 134* 136  K 4.1 4.3  CL 107 110  CO2 19*  21*  GLUCOSE 189* 92  BUN 16 15  CREATININE 1.10* 0.99  CALCIUM 8.4* 8.2*  GFRNONAA 51* 58*  ANIONGAP 8 5    Recent Labs  Lab 03/23/22 1401  PROT 6.7  ALBUMIN 3.0*  AST 20  ALT 10  ALKPHOS 69  BILITOT 1.7*   Lipids No results for input(s): "CHOL", "TRIG", "HDL", "LABVLDL", "LDLCALC", "CHOLHDL" in the last 168 hours.  Hematology Recent Labs  Lab 03/23/22 1401 03/24/22 0556  WBC 14.7* 17.4*  RBC 4.75 4.63  HGB 12.9 12.9  HCT 39.8 40.1  MCV 83.8 86.6  MCH 27.2 27.9  MCHC 32.4 32.2  RDW 18.2* 17.7*  PLT 192 176   Thyroid No results for input(s): "TSH", "FREET4" in the last 168 hours.  BNPNo results for input(s): "BNP", "PROBNP" in the last 168 hours.  DDimer No results for input(s): "DDIMER" in the last 168 hours.   Radiology/Studies:  CT ABDOMEN PELVIS W CONTRAST  Result Date: 03/23/2022 CLINICAL DATA:  Abdominal pain x2 days EXAM: CT ABDOMEN AND PELVIS WITH CONTRAST TECHNIQUE: Multidetector CT imaging of the abdomen and pelvis was performed using the standard protocol following bolus administration of intravenous contrast. RADIATION DOSE REDUCTION: This exam was performed according to the departmental dose-optimization program which includes automated exposure control, adjustment of the mA and/or kV according to patient size and/or use of iterative reconstruction  technique. CONTRAST:  OMNIPAQUE IOHEXOL 300 MG/ML  SOLN COMPARISON:  01/20/2021 FINDINGS: Lower chest: Heart is enlarged in size. Visualized lower lung fields are clear. Hepatobiliary: Air is seen in the intrahepatic and extrahepatic bile ducts. This may suggest a previous sphincterotomy. There is fluid around the gallbladder. There is inflammatory stranding in the fat planes immediately behind the right rectus muscle close to the fundus of the gallbladder. There are pockets of air in the gallbladder and pericholecystic region. Clinical history suggests that the patient had placement of cholecystostomy tube which  has fallen out. Air in and adjacent to the gallbladder may be related to recently removed cholecystostomy tube all suggest emphysematous cholecystitis. There is no loculated thick-walled fluid collection adjacent to the gallbladder. Pancreas: No focal abnormalities are seen. Spleen: Unremarkable. Adrenals/Urinary Tract: Adrenals are unremarkable. 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 There is no hydronephrosis. There are few low-density lesions in kidneys, possibly cysts. There are no renal or ureteral stones. Urinary bladder is unremarkable. Stomach/Bowel: Small hiatal hernia is seen. There is no significant dilation of small bowel loops. The appendix is unremarkable. Scattered diverticula are seen in colon without signs of focal diverticulitis. Vascular/Lymphatic: Atherosclerotic plaques and calcifications are seen in aorta and its major branches. 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 Reproductive: Unremarkable. Other: There is no ascites or significant pneumoperitoneum. Umbilical hernia containing fat is seen. Musculoskeletal: There is minimal anterolisthesis at the L4-L5 level. There is mild to moderate spinal stenosis at the L4-L5 level. There is encroachment of neural foramina at multiple levels. IMPRESSION: There is fluid around the gallbladder. There are pockets of air in and adjacent to the gallbladder. Clinical history suggests recent placement of cholecystostomy catheter which may have fallen out. These changes could be related to recent placement of cholecystostomy catheter. Another diagnostic possibility would be emphysematous cholecystitis. There is stranding in the fat planes adjacent to the fundus of the gallbladder without any thick-walled loculated fluid collections. Air in the lumen of bile ducts may suggest previous sphincterotomy. There is no evidence of intestinal obstruction or pneumoperitoneum. There is no hydronephrosis.  Diverticulosis of colon without focal diverticulitis. Other findings as described in the body of the report. Imaging finding of air in and around the gallbladder was discussed with Dr. Jacqulyn Bath by telephone call. Electronically Signed   By: Ernie Avena M.D.   On: 03/23/2022 15:29     Assessment and Plan:   Pre-op eval for possible lap chole vs replacement of her cholecystostomy tube.  Surgery has not seen.  No hx of CAD though with DM and age and PAD may have, not very active.  She has atrial fib and tachy/brady unable to tolerate meds for rate or rhythm control though has been tolerant of toprol XL 12.5 mg.  Not candidate for ablation.  Plans had been for wireless PM, she had not agreed to procedure as of yet.  On coumadin with INR 2.  Would need to hold coumadin defer to surgery currently on hold.   For lap chole risk 6.6% risk of major cardiac event.  Would need to monitor for bradycardia. Dr. Anne Fu to see. Acute on chronic cholecystitis. 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  On low dose of BB has not tolerated higher doses.  COPD/hx DVT/PAD  per IM   Risk Assessment/Risk Scores:          CHA2DS2-VASc Score = 7   This indicates a 11.2% annual risk of stroke. The patient's score is based upon: CHF History: 1 HTN History: 1 Diabetes History: 1 Stroke History: 0 Vascular Disease History: 1 Age Score: 2 Gender Score: 1         For questions or updates, please contact [facility] Please consult [email or web address] for contact info under    Signed, Nada Boozer, NP  03/24/2022 9:19 AM  Personally seen and examined. Agree with above.  81 year old with cholecystitis with permanent atrial fibrillation with prior issues with bradycardia/tachycardia.  Atrial fibrillation permanent - I previously been seen  at outside facility [facility] with discussion of leadless pacemaker.  Given acute cholecystitis/infection, would avoid implantation at this time. -Ejection fraction 65%. -Deemed not a candidate for ablation. -Had recent admission for heart rates in the 30s. -Had refused cholecystectomy in the past.  Had PERC tube in the past. -Heart rates ranging from 68-107 -Has been tolerant of low-dose Toprol-XL 12.5 mg.  Okay to continue.  Try to avoid higher doses because of prior bradycardia.  Came in again with abdominal pain.  On IV Zosyn.  Chronic anticoagulation - INR 2.6.  Coumadin on hold.  AKI - Improved with fluids.  Hypotension improved with fluids as well.  We will follow along.  If she needs percutaneous drainage of gallbladder, she may proceed from a cardiac perspective.  Donato Schultz, MD

## 2022-03-25 DIAGNOSIS — K812 Acute cholecystitis with chronic cholecystitis: Secondary | ICD-10-CM | POA: Diagnosis not present

## 2022-03-25 DIAGNOSIS — I5032 Chronic diastolic (congestive) heart failure: Secondary | ICD-10-CM | POA: Diagnosis not present

## 2022-03-25 DIAGNOSIS — I4811 Longstanding persistent atrial fibrillation: Secondary | ICD-10-CM | POA: Diagnosis not present

## 2022-03-25 DIAGNOSIS — E1165 Type 2 diabetes mellitus with hyperglycemia: Secondary | ICD-10-CM | POA: Diagnosis not present

## 2022-03-25 LAB — CBC WITH DIFFERENTIAL/PLATELET
Abs Immature Granulocytes: 0.06 10*3/uL (ref 0.00–0.07)
Basophils Absolute: 0.1 10*3/uL (ref 0.0–0.1)
Basophils Relative: 0 %
Eosinophils Absolute: 0.1 10*3/uL (ref 0.0–0.5)
Eosinophils Relative: 1 %
HCT: 39.2 % (ref 36.0–46.0)
Hemoglobin: 12.3 g/dL (ref 12.0–15.0)
Immature Granulocytes: 0 %
Lymphocytes Relative: 12 %
Lymphs Abs: 1.6 10*3/uL (ref 0.7–4.0)
MCH: 26.8 pg (ref 26.0–34.0)
MCHC: 31.4 g/dL (ref 30.0–36.0)
MCV: 85.4 fL (ref 80.0–100.0)
Monocytes Absolute: 1.3 10*3/uL — ABNORMAL HIGH (ref 0.1–1.0)
Monocytes Relative: 9 %
Neutro Abs: 10.8 10*3/uL — ABNORMAL HIGH (ref 1.7–7.7)
Neutrophils Relative %: 78 %
Platelets: 194 10*3/uL (ref 150–400)
RBC: 4.59 MIL/uL (ref 3.87–5.11)
RDW: 18 % — ABNORMAL HIGH (ref 11.5–15.5)
WBC: 13.9 10*3/uL — ABNORMAL HIGH (ref 4.0–10.5)
nRBC: 0 % (ref 0.0–0.2)

## 2022-03-25 LAB — GLUCOSE, CAPILLARY
Glucose-Capillary: 84 mg/dL (ref 70–99)
Glucose-Capillary: 127 mg/dL — ABNORMAL HIGH (ref 70–99)
Glucose-Capillary: 139 mg/dL — ABNORMAL HIGH (ref 70–99)
Glucose-Capillary: 181 mg/dL — ABNORMAL HIGH (ref 70–99)

## 2022-03-25 LAB — PROTIME-INR
INR: 2.1 — ABNORMAL HIGH (ref 0.8–1.2)
Prothrombin Time: 22.9 seconds — ABNORMAL HIGH (ref 11.4–15.2)

## 2022-03-25 LAB — CULTURE, BLOOD (ROUTINE X 2)

## 2022-03-25 LAB — BASIC METABOLIC PANEL
Anion gap: 6 (ref 5–15)
BUN: 15 mg/dL (ref 8–23)
CO2: 23 mmol/L (ref 22–32)
Calcium: 9.1 mg/dL (ref 8.9–10.3)
Chloride: 110 mmol/L (ref 98–111)
Creatinine, Ser: 1.1 mg/dL — ABNORMAL HIGH (ref 0.44–1.00)
GFR, Estimated: 51 mL/min — ABNORMAL LOW (ref >60)
Glucose, Bld: 82 mg/dL (ref 70–99)
Potassium: 4.2 mmol/L (ref 3.5–5.1)
Sodium: 139 mmol/L (ref 135–145)

## 2022-03-25 NOTE — Progress Notes (Signed)
Interventional Radiology Brief Note:  IR following for plans to replace percutaneous cholecystostomy tube once INR <1.6.  Recheck this AM shows INR decreased from 2.6  2.1.  Patient with slight improvement in WBC from 17  13K. Remains afebrile.  Some abdominal pain, but otherwise stable.   Dr. Juliette Alcide has discussed with [facility], plan to continue to wait for INR to trend <1.6 unless patient decompensates. May eat today.  Will place orders for NPO p MN.   Spoke with RN, Dot Lanes.   Loyce Dys, MS RD PA-C

## 2022-03-25 NOTE — Progress Notes (Signed)
PROGRESS NOTE    Jill Crane  ACZ:[id number] DOB: 12-04-1940 DOA: 03/23/2022 PCP: Zoila Shutter, MD    Chief Complaint  Patient presents with   Abdominal Pain    Brief Narrative:  Jill Crane is a 81 y.o. 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 A year ago she had a stone in her bile duct and underwent ERCP for removal of this. She refused a cholecystectomy after that. She then presented this year in July with sepsis secondary to acute cholecystitis.  she underwent percutaneous cholecystostomy tube placemen on 01/30/2022. She was due to have a cholangiogram but the drain inadvertently fell out 03/13/2022.She begin to have symptoms of abdominal discomfort and had an episode of vomiting 2 days ago.  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  No loculated fluid collection.  General surgery, IR and cardiology consulted and on board.   Assessment & Plan:   Principal Problem:   Acute on chronic cholecystitis Active Problems:   Atrial fibrillation (HCC)   Diabetes (HCC)   AKI (acute kidney injury) (HCC)   Chronic diastolic CHF (congestive heart failure) (HCC)   Hypotension   History of pemphigus   Acute on chronic cholecystitis:  - CT abd and pelvis reveal recurrence of cholecystitis.  - started on IV zosyn, continue with IV fluids and IV pain control. - gen surgery and IR on board.  - cardiology consulted for clearance.  - waiting for INR to improve, to plan perc cholecystotomy tube by IR possibly by Monday.  - clears for now and NPO after midnight tomorrow.  - improving leukocytosis.   H/o Pemphigus:  Continue with prednisone.    Hypotension; resolved.    H/o chronic diastolic Chf She appears to be compensated.    AKI;  Creatinine back to baseline    Atrial  fibrillation  On coumadin for anti coagulation, which is on hold.  Rate controlled with metoprolol 12.5 mg daily.    Type 2 DM CBG (last 3)  Recent Labs    03/24/22 2143 03/25/22 0720 03/25/22 1131  GLUCAP 118* 84 127*    Continue with SSI.  Last A1c is 6.8%  COPD:  No wheezing heard.    Staph epidermidis bacteremia:  On IV zosyn.  Continue to monitor.     DVT prophylaxis: scd's Code Status: full code.  Family Communication: none at bedside.  Disposition:   Status is: Inpatient Remains inpatient appropriate because: will need IR perc drain on Monday.    Level of care: Telemetry Consultants:  General surgery IR.  Cardiology.   Procedures: CT abd and pelvis.   Antimicrobials:  Antibiotics Given (last 72 hours)     Date/Time Action Medication Dose Rate   03/23/22 2030 New Bag/Given   piperacillin-tazobactam (ZOSYN) IVPB 3.375 g 3.375 g 12.5 mL/hr   03/24/22 0308 New Bag/Given   piperacillin-tazobactam (ZOSYN) IVPB 3.375 g 3.375 g 12.5 mL/hr   03/24/22 1235 New Bag/Given   piperacillin-tazobactam (ZOSYN) IVPB 3.375 g 3.375 g 12.5 mL/hr   03/24/22 2033 New Bag/Given   piperacillin-tazobactam (ZOSYN) IVPB 3.375 g 3.375 g 12.5 mL/hr   03/25/22 0429 New Bag/Given   piperacillin-tazobactam (ZOSYN) IVPB 3.375 g 3.375 g 12.5 mL/hr   03/25/22 1201 New Bag/Given   piperacillin-tazobactam (ZOSYN) IVPB 3.375 g 3.375 g 12.5 mL/hr          Subjective:  Abd pain has improved. Some nausea,  no vomiting. No chest pain or sob.    Objective: Vitals:   03/24/22 1319 03/24/22 2019 03/25/22 0455 03/25/22 1352  BP: 103/67 (!) 114/54 (!) 102/90 115/76  Pulse: (!) 101 95 81 80  Resp: 16 20 18 18   Temp: 98 F (36.7 C) 98.5 F (36.9 C) 98.3 F (36.8 C) 97.9 F (36.6 C)  TempSrc: Oral Oral Oral Oral  SpO2: 95% 99% 99% 100%  Weight:      Height:        Intake/Output Summary (Last 24 hours) at 03/25/2022 1634 Last data filed at 03/25/2022 1500 Gross per 24 hour   Intake 285.83 ml  Output 2500 ml  Net -2214.17 ml   Filed Weights   03/23/22 1339  Weight: 68 kg    Examination:  General exam: Appears calm and comfortable  Respiratory system: Clear to auscultation. Respiratory effort normal. Cardiovascular system: S1 & S2 heard, RRR. No JVD, murmurs, rubs, gallops or clicks. No pedal edema. Gastrointestinal system: Abdomen is soft, RUQ PAIN improving.  Central nervous system: Alert and oriented. No focal neurological deficits. Extremities: Symmetric 5 x 5 power. Skin: No rashes, lesions or ulcers Psychiatry:  Mood & affect appropriate.      Data Reviewed: I have personally reviewed following labs and imaging studies  CBC: Recent Labs  Lab 03/23/22 1401 03/24/22 0556 03/25/22 0724  WBC 14.7* 17.4* 13.9*  NEUTROABS  --   --  10.8*  HGB 12.9 12.9 12.3  HCT 39.8 40.1 39.2  MCV 83.8 86.6 85.4  PLT 192 176 194     Basic Metabolic Panel: Recent Labs  Lab 03/23/22 1401 03/24/22 0556 03/25/22 0724  NA 134* 136 139  K 4.1 4.3 4.2  CL 107 110 110  CO2 19* 21* 23  GLUCOSE 189* 92 82  BUN 16 15 15   CREATININE 1.10* 0.99 1.10*  CALCIUM 8.4* 8.2* 9.1     GFR: Estimated Creatinine Clearance: 36.9 mL/min (A) (by C-G formula based on SCr of 1.1 mg/dL (H)).  Liver Function Tests: Recent Labs  Lab 03/23/22 1401  AST 20  ALT 10  ALKPHOS 69  BILITOT 1.7*  PROT 6.7  ALBUMIN 3.0*     CBG: Recent Labs  Lab 03/24/22 1123 03/24/22 1638 03/24/22 2143 03/25/22 0720 03/25/22 1131  GLUCAP 107* 149* 118* 84 127*      Recent Results (from the past 240 hour(s))  Culture, blood (routine x 2)     Status: None (Preliminary result)   Collection Time: 03/23/22  4:31 PM   Specimen: BLOOD RIGHT FOREARM  Result Value Ref Range Status   Specimen Description   Final    BLOOD RIGHT FOREARM Performed at [facility], [address] [place], [facility] Jill Crane    Special Requests   Final    BOTTLES DRAWN AEROBIC AND  ANAEROBIC Blood Culture adequate volume Performed at [facility], [address] [place], [address] Jill Crane    Culture   Final    NO GROWTH 2 DAYS Performed at [facility], [address], [place], [address] [place]    Report Status PENDING  Incomplete  Culture, blood (routine x 2)     Status: Abnormal   Collection Time: 03/23/22  6:49 PM   Specimen: BLOOD LEFT HAND  Result Value Ref Range Status   Specimen Description   Final    BLOOD LEFT HAND Performed at [facility],  [address], [place], [place] [address]    Special Requests   Final    BOTTLES DRAWN AEROBIC ONLY Blood Culture results may not be optimal due to an inadequate volume of blood received in culture bottles Performed at [facility], [address], [place], [place] [address]    Culture  Setup Time   Final    GRAM POSITIVE COCCI IN CLUSTERS AEROBIC BOTTLE ONLY CRITICAL RESULT CALLED TO, READ BACK BY AND VERIFIED WITH: PHARMD J. LEGGE [id number] @1537  fh    Culture (A)  Final    STAPHYLOCOCCUS EPIDERMIDIS THE SIGNIFICANCE OF ISOLATING THIS ORGANISM FROM A SINGLE VENIPUNCTURE CANNOT BE PREDICTED WITHOUT FURTHER CLINICAL AND CULTURE CORRELATION. SUSCEPTIBILITIES AVAILABLE ONLY ON REQUEST. Performed at [facility], [address], [place], [place]    Report Status 03/25/2022 FINAL  Final  Blood Culture ID Panel (Reflexed)     Status: Abnormal   Collection Time: 03/23/22  6:49 PM  Result Value Ref Range Status   Enterococcus faecalis NOT DETECTED NOT DETECTED Final   Enterococcus Faecium NOT DETECTED NOT DETECTED Final   Listeria monocytogenes NOT DETECTED NOT DETECTED Final   Staphylococcus species DETECTED (A) NOT DETECTED Final    Comment: CRITICAL RESULT CALLED TO, READ BACK BY AND VERIFIED WITH: PHARMD J. LEGGE 05/23/22 @1537  fh    Staphylococcus aureus (BCID) NOT DETECTED NOT DETECTED Final   Staphylococcus epidermidis DETECTED (A) NOT DETECTED Final     Comment: CRITICAL RESULT CALLED TO, READ BACK BY AND VERIFIED WITH: PHARMD J. LEGGE [id number] @1537  fh    Staphylococcus lugdunensis NOT DETECTED NOT DETECTED Final   Streptococcus species NOT DETECTED NOT DETECTED Final   Streptococcus agalactiae NOT DETECTED NOT DETECTED Final   Streptococcus pneumoniae NOT DETECTED NOT DETECTED Final   Streptococcus pyogenes NOT DETECTED NOT DETECTED Final   A.calcoaceticus-baumannii NOT DETECTED NOT DETECTED Final   Bacteroides fragilis NOT DETECTED NOT DETECTED Final   Enterobacterales NOT DETECTED NOT DETECTED Final   Enterobacter cloacae complex NOT DETECTED NOT DETECTED Final   Escherichia coli NOT DETECTED NOT DETECTED Final   Klebsiella aerogenes NOT DETECTED NOT DETECTED Final   Klebsiella oxytoca NOT DETECTED NOT DETECTED Final   Klebsiella pneumoniae NOT DETECTED NOT DETECTED Final   Proteus species NOT DETECTED NOT DETECTED Final   Salmonella species NOT DETECTED NOT DETECTED Final   Serratia marcescens NOT DETECTED NOT DETECTED Final   Haemophilus influenzae NOT DETECTED NOT DETECTED Final   Neisseria meningitidis NOT DETECTED NOT DETECTED Final   Pseudomonas aeruginosa NOT DETECTED NOT DETECTED Final   Stenotrophomonas maltophilia NOT DETECTED NOT DETECTED Final   Candida albicans NOT DETECTED NOT DETECTED Final   Candida auris NOT DETECTED NOT DETECTED Final   Candida glabrata NOT DETECTED NOT DETECTED Final   Candida krusei NOT DETECTED NOT DETECTED Final   Candida parapsilosis NOT DETECTED NOT DETECTED Final   Candida tropicalis NOT DETECTED NOT DETECTED Final   Cryptococcus neoformans/gattii NOT DETECTED NOT DETECTED Final   Methicillin resistance mecA/C NOT DETECTED NOT DETECTED Final    Comment: Performed at [facility], [address] [place], [facility] [address]         Radiology Studies: No results found.      Scheduled Meds:  brimonidine  1 drop Left Eye BID   busPIRone  10 mg Oral BID   calcium-vitamin  D  1 tablet Oral BID   dorzolamide  1 drop Left Eye BID   furosemide  20 mg Oral Daily   insulin aspart  0-9 Units Subcutaneous TID WC   latanoprost  1 drop Both Eyes QHS   lidocaine  1 patch Transdermal Q24H   magnesium oxide  400 mg Oral BID   metoprolol succinate  12.5 mg Oral Daily   pantoprazole  40 mg Oral Daily   predniSONE  7.5 mg Oral Daily   Continuous Infusions:  piperacillin-tazobactam (ZOSYN)  IV 3.375 g (03/25/22 1201)     LOS: 2 days        Kathlen Mody, MD [facility]   To contact the attending provider between 7A-7P or the covering provider during after hours 7P-7A, please log into the web site [email or web address] and access using universal [place] password for that web site. If you do not have the password, please call the hospital operator.  03/25/2022, 4:34 PM

## 2022-03-25 NOTE — Progress Notes (Signed)
Subjective/Chief Complaint: Still having some pain.  Hungry.  No n/v.     Objective: Vital signs in last 24 hours: Temp:  [98 F (36.7 C)-98.5 F (36.9 C)] 98.3 F (36.8 C) (09/09 0455) Pulse Rate:  [81-101] 81 (09/09 0455) Resp:  [16-20] 18 (09/09 0455) BP: (102-114)/(54-90) 102/90 (09/09 0455) SpO2:  [95 %-99 %] 99 % (09/09 0455) Last BM Date :  (PTA)  Intake/Output from previous day: 09/08 0701 - 09/09 0700 In: -  Out: 2500 [Urine:2500] Intake/Output this shift: No intake/output data recorded.  General appearance: alert, cooperative, and no distress Resp: breathing normally GI: soft, non distended, mild tenderness RUQ. No r/g.    Lab Results:  Recent Labs    03/24/22 0556 03/25/22 0724  WBC 17.4* 13.9*  HGB 12.9 12.3  HCT 40.1 39.2  PLT 176 194   BMET Recent Labs    03/24/22 0556 03/25/22 0724  NA 136 139  K 4.3 4.2  CL 110 110  CO2 21* 23  GLUCOSE 92 82  BUN 15 15  CREATININE 0.99 1.10*  CALCIUM 8.2* 9.1   PT/INR Recent Labs    03/24/22 0556 03/25/22 0724  LABPROT 27.4* 22.9*  INR 2.6* 2.1*   ABG No results for input(s): "PHART", "HCO3" in the last 72 hours.  Invalid input(s): "PCO2", "PO2"  Studies/Results: CT ABDOMEN PELVIS W CONTRAST  Result Date: 03/23/2022 CLINICAL DATA:  Abdominal pain x2 days EXAM: CT ABDOMEN AND PELVIS WITH CONTRAST TECHNIQUE: Multidetector CT imaging of the abdomen and pelvis was performed using the standard protocol following bolus administration of intravenous contrast. RADIATION DOSE REDUCTION: This exam was performed according to the departmental dose-optimization program which includes automated exposure control, adjustment of the mA and/or kV according to patient size and/or use of iterative reconstruction technique. CONTRAST:  OMNIPAQUE IOHEXOL 300 MG/ML  SOLN COMPARISON:  01/20/2021 FINDINGS: Lower chest: Heart is enlarged in size. Visualized lower lung fields are clear. Hepatobiliary: Air is seen in  the intrahepatic and extrahepatic bile ducts. This may suggest a previous sphincterotomy. There is fluid around the gallbladder. There is inflammatory stranding in the fat planes immediately behind the right rectus muscle close to the fundus of the gallbladder. There are pockets of air in the gallbladder and pericholecystic region. Clinical history suggests that the patient had placement of cholecystostomy tube which has fallen out. Air in and adjacent to the gallbladder may be related to recently removed cholecystostomy tube all suggest emphysematous cholecystitis. There is no loculated thick-walled fluid collection adjacent to the gallbladder. Pancreas: No focal abnormalities are seen. Spleen: Unremarkable. Adrenals/Urinary Tract: Adrenals are unremarkable. 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 There is no hydronephrosis. There are few low-density lesions in kidneys, possibly cysts. There are no renal or ureteral stones. Urinary bladder is unremarkable. Stomach/Bowel: Small hiatal hernia is seen. There is no significant dilation of small bowel loops. The appendix is unremarkable. Scattered diverticula are seen in colon without signs of focal diverticulitis. Vascular/Lymphatic: Atherosclerotic plaques and calcifications are seen in aorta and its major branches. 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 Reproductive: Unremarkable. Other: There is no ascites or significant pneumoperitoneum. Umbilical hernia containing fat is seen. Musculoskeletal: There is minimal anterolisthesis at the L4-L5 level. There is mild to moderate spinal stenosis at the L4-L5 level. There is encroachment of neural foramina at multiple levels. IMPRESSION: There is fluid around the gallbladder. There are pockets of air in and adjacent  to the gallbladder. Clinical history suggests recent placement of cholecystostomy catheter which may have fallen out. These changes could be related to  recent placement of cholecystostomy catheter. Another diagnostic possibility would be emphysematous cholecystitis. There is stranding in the fat planes adjacent to the fundus of the gallbladder without any thick-walled loculated fluid collections. Air in the lumen of bile ducts may suggest previous sphincterotomy. There is no evidence of intestinal obstruction or pneumoperitoneum. There is no hydronephrosis. Diverticulosis of colon without focal diverticulitis. Other findings as described in the body of the report. Imaging finding of air in and around the gallbladder was discussed with Dr. Jacqulyn Bath by telephone call. Electronically Signed   By: Ernie Avena M.D.   On: 03/23/2022 15:29    Anti-infectives: Anti-infectives (From admission, onward)    Start     Dose/Rate Route Frequency Ordered Stop   03/23/22 2000  piperacillin-tazobactam (ZOSYN) IVPB 3.375 g        3.375 g 12.5 mL/hr over 240 Minutes Intravenous Every 8 hours 03/23/22 1837     03/23/22 1800  piperacillin-tazobactam (ZOSYN) IVPB 3.375 g  Status:  Discontinued        3.375 g 100 mL/hr over 30 Minutes Intravenous Every 6 hours 03/23/22 1540 03/23/22 1838       Assessment/Plan: Acute cholecystitis, s/p perc chole tube 01/30/2022, dislodged 03/11/2022.  Was going to leave out until follow up appt with Dr. Magnus Ivan but she came back to ED with s/s of acute cholecystitis.  Symptoms are mild at this point.  WBCs improving.    Holding coumadin for INR to be 1.5 or less for replacement of tube. IR following.    If no procedure planned today, would let her eat.     LOS: 2 days   Maudry Diego, MD FACS Surgical Oncology, General Surgery, Trauma and Critical [facility] Surgery, [place] [phone] for weekday/non holidays Check [email or web address] for coverage night/weekend/holidays

## 2022-03-26 DIAGNOSIS — I4811 Longstanding persistent atrial fibrillation: Secondary | ICD-10-CM | POA: Diagnosis not present

## 2022-03-26 DIAGNOSIS — I5032 Chronic diastolic (congestive) heart failure: Secondary | ICD-10-CM | POA: Diagnosis not present

## 2022-03-26 DIAGNOSIS — K812 Acute cholecystitis with chronic cholecystitis: Secondary | ICD-10-CM | POA: Diagnosis not present

## 2022-03-26 DIAGNOSIS — E1165 Type 2 diabetes mellitus with hyperglycemia: Secondary | ICD-10-CM | POA: Diagnosis not present

## 2022-03-26 LAB — GLUCOSE, CAPILLARY
Glucose-Capillary: 80 mg/dL (ref 70–99)
Glucose-Capillary: 208 mg/dL — ABNORMAL HIGH (ref 70–99)
Glucose-Capillary: 160 mg/dL — ABNORMAL HIGH (ref 70–99)
Glucose-Capillary: 198 mg/dL — ABNORMAL HIGH (ref 70–99)

## 2022-03-26 LAB — CBC WITH DIFFERENTIAL/PLATELET
Abs Immature Granulocytes: 0.03 10*3/uL (ref 0.00–0.07)
Basophils Absolute: 0 10*3/uL (ref 0.0–0.1)
Basophils Relative: 0 %
Eosinophils Absolute: 0.1 10*3/uL (ref 0.0–0.5)
Eosinophils Relative: 1 %
HCT: 39.5 % (ref 36.0–46.0)
Hemoglobin: 12.4 g/dL (ref 12.0–15.0)
Immature Granulocytes: 0 %
Lymphocytes Relative: 20 %
Lymphs Abs: 1.8 10*3/uL (ref 0.7–4.0)
MCH: 27 pg (ref 26.0–34.0)
MCHC: 31.4 g/dL (ref 30.0–36.0)
MCV: 86.1 fL (ref 80.0–100.0)
Monocytes Absolute: 0.8 10*3/uL (ref 0.1–1.0)
Monocytes Relative: 9 %
Neutro Abs: 6 10*3/uL (ref 1.7–7.7)
Neutrophils Relative %: 70 %
Platelets: 209 10*3/uL (ref 150–400)
RBC: 4.59 MIL/uL (ref 3.87–5.11)
RDW: 17.7 % — ABNORMAL HIGH (ref 11.5–15.5)
WBC: 8.6 10*3/uL (ref 4.0–10.5)
nRBC: 0 % (ref 0.0–0.2)

## 2022-03-26 LAB — BASIC METABOLIC PANEL
Anion gap: 9 (ref 5–15)
BUN: 13 mg/dL (ref 8–23)
CO2: 22 mmol/L (ref 22–32)
Calcium: 9.4 mg/dL (ref 8.9–10.3)
Chloride: 109 mmol/L (ref 98–111)
Creatinine, Ser: 0.98 mg/dL (ref 0.44–1.00)
GFR, Estimated: 58 mL/min — ABNORMAL LOW (ref >60)
Glucose, Bld: 86 mg/dL (ref 70–99)
Potassium: 4.3 mmol/L (ref 3.5–5.1)
Sodium: 140 mmol/L (ref 135–145)

## 2022-03-26 LAB — PROTIME-INR
INR: 1.8 — ABNORMAL HIGH (ref 0.8–1.2)
Prothrombin Time: 20.3 seconds — ABNORMAL HIGH (ref 11.4–15.2)

## 2022-03-26 NOTE — Progress Notes (Signed)
PROGRESS NOTE    Jill Crane  FOY:[id number] DOB: 08-25-1940 DOA: 03/23/2022 PCP: Zoila Shutter, MD    Chief Complaint  Patient presents with   Abdominal Pain    Brief Narrative:  Jill Crane is a 81 y.o. 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 A year ago she had a stone in her bile duct and underwent ERCP for removal of this. She refused a cholecystectomy after that. She then presented this year in July with sepsis secondary to acute cholecystitis.  she underwent percutaneous cholecystostomy tube placemen on 01/30/2022. She was due to have a cholangiogram but the drain inadvertently fell out 03/13/2022.She begin to have symptoms of abdominal discomfort and had an episode of vomiting 2 days ago.  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  No loculated fluid collection.  General surgery, IR and cardiology consulted and on board.   Assessment & Plan:   Principal Problem:   Acute on chronic cholecystitis Active Problems:   Atrial fibrillation (HCC)   Diabetes (HCC)   AKI (acute kidney injury) (HCC)   Chronic diastolic CHF (congestive heart failure) (HCC)   Hypotension   History of pemphigus   Acute on chronic cholecystitis:  - CT abd and pelvis reveal recurrence of cholecystitis.  - started on IV zosyn, continue with IV fluids and IV pain control. - gen surgery and IR on board.  - cardiology consulted for clearance.  - waiting for INR to improve, to plan perc cholecystotomy tube by IR possibly by Monday.  - INR is 1.8 today, when less than 1.6 , plan for perc drain placement.  - clears for now and NPO after midnight today.  - improving leukocytosis.  - pt denies any nausea, vomiting.   H/o Pemphigus:  Continue with prednisone.    Hypotension; resolved.  BP parameters  are optimal.    H/o chronic diastolic Chf She appears to be compensated.    AKI;  Creatinine back to baseline    Atrial fibrillation  On coumadin for anti coagulation, which is on hold. INR is 1.8 today. Continue to monitor.  Rate controlled with metoprolol 12.5 mg daily.    Type 2 DM cbgs are better controlled.  CBG (last 3)  Recent Labs    03/25/22 2208 03/26/22 0713 03/26/22 1153  GLUCAP 181* 80 208*    Continue with SSI.  Last A1c is 6.8%  COPD:  No wheezing heard.    Staph epidermidis bacteremia:  On IV zosyn.  Continue to monitor.  Suspect a contaminant.     DVT prophylaxis: scd's Code Status: full code.  Family Communication: none at bedside.  Disposition:   Status is: Inpatient Remains inpatient appropriate because: will need IR perc drain on Monday.    Level of care: Telemetry Consultants:  General surgery IR.  Cardiology.   Procedures: CT abd and pelvis.   Antimicrobials:  Antibiotics Given (last 72 hours)     Date/Time Action Medication Dose Rate   03/23/22 2030 New Bag/Given   piperacillin-tazobactam (ZOSYN) IVPB 3.375 g 3.375 g 12.5 mL/hr   03/24/22 0308 New Bag/Given   piperacillin-tazobactam (ZOSYN) IVPB 3.375 g 3.375 g 12.5 mL/hr   03/24/22 1235 New Bag/Given   piperacillin-tazobactam (ZOSYN) IVPB 3.375 g 3.375 g 12.5 mL/hr   03/24/22 2033 New Bag/Given   piperacillin-tazobactam (ZOSYN) IVPB 3.375 g 3.375 g 12.5 mL/hr  03/25/22 0429 New Bag/Given   piperacillin-tazobactam (ZOSYN) IVPB 3.375 g 3.375 g 12.5 mL/hr   03/25/22 1201 New Bag/Given   piperacillin-tazobactam (ZOSYN) IVPB 3.375 g 3.375 g 12.5 mL/hr   03/25/22 2003 New Bag/Given   piperacillin-tazobactam (ZOSYN) IVPB 3.375 g 3.375 g 12.5 mL/hr   03/26/22 0428 New Bag/Given   piperacillin-tazobactam (ZOSYN) IVPB 3.375 g 3.375 g 12.5 mL/hr   03/26/22 1202 New Bag/Given   piperacillin-tazobactam (ZOSYN) IVPB 3.375 g 3.375 g 12.5 mL/hr          Subjective: No  nausea, vomiting abd pain has improved.   Objective: Vitals:   03/25/22 1352 03/25/22 2012 03/26/22 0434 03/26/22 1404  BP: 115/76 113/75 106/71 94/64  Pulse: 80 80 83 99  Resp: 18 16 16 18   Temp: 97.9 F (36.6 C) 98.6 F (37 C) 98 F (36.7 C) 97.9 F (36.6 C)  TempSrc: Oral Oral Oral Oral  SpO2: 100% 100% 99% 100%  Weight:      Height:        Intake/Output Summary (Last 24 hours) at 03/26/2022 1514 Last data filed at 03/26/2022 0714 Gross per 24 hour  Intake --  Output 2600 ml  Net -2600 ml    Filed Weights   03/23/22 1339  Weight: 68 kg    Examination:  General exam: Appears calm and comfortable  Respiratory system: Clear to auscultation. Respiratory effort normal. Cardiovascular system: S1 & S2 heard, RRR. No JVD,  No pedal edema. Gastrointestinal system: Abdomen is nondistended, soft and nontender.  Normal bowel sounds heard. Central nervous system: Alert and oriented. No focal neurological deficits. Extremities: Symmetric 5 x 5 power. Skin: No rashes, lesions or ulcers Psychiatry:  Mood & affect appropriate.       Data Reviewed: I have personally reviewed following labs and imaging studies  CBC: Recent Labs  Lab 03/23/22 1401 03/24/22 0556 03/25/22 0724 03/26/22 0542  WBC 14.7* 17.4* 13.9* 8.6  NEUTROABS  --   --  10.8* 6.0  HGB 12.9 12.9 12.3 12.4  HCT 39.8 40.1 39.2 39.5  MCV 83.8 86.6 85.4 86.1  PLT 192 176 194 209     Basic Metabolic Panel: Recent Labs  Lab 03/23/22 1401 03/24/22 0556 03/25/22 0724 03/26/22 0542  NA 134* 136 139 140  K 4.1 4.3 4.2 4.3  CL 107 110 110 109  CO2 19* 21* 23 22  GLUCOSE 189* 92 82 86  BUN 16 15 15 13   CREATININE 1.10* 0.99 1.10* 0.98  CALCIUM 8.4* 8.2* 9.1 9.4     GFR: Estimated Creatinine Clearance: 41.4 mL/min (by C-G formula based on SCr of 0.98 mg/dL).  Liver Function Tests: Recent Labs  Lab 03/23/22 1401  AST 20  ALT 10  ALKPHOS 69  BILITOT 1.7*  PROT 6.7  ALBUMIN 3.0*      CBG: Recent Labs  Lab 03/25/22 1131 03/25/22 1653 03/25/22 2208 03/26/22 0713 03/26/22 1153  GLUCAP 127* 139* 181* 80 208*      Recent Results (from the past 240 hour(s))  Culture, blood (routine x 2)     Status: None (Preliminary result)   Collection Time: 03/23/22  4:31 PM   Specimen: BLOOD RIGHT FOREARM  Result Value Ref Range Status   Specimen Description   Final    BLOOD RIGHT FOREARM Performed at [facility], [address] [place], [address] [place]    Special Requests   Final    BOTTLES DRAWN AEROBIC AND ANAEROBIC Blood Culture adequate volume Performed  at [facility], [address] [place], [place] [address]    Culture   Final    NO GROWTH 3 DAYS Performed at [facility], [address] [place], [place] [address]    Report Status PENDING  Incomplete  Culture, blood (routine x 2)     Status: Abnormal   Collection Time: 03/23/22  6:49 PM   Specimen: BLOOD LEFT HAND  Result Value Ref Range Status   Specimen Description   Final    BLOOD LEFT HAND Performed at [facility], [address] [place], [place] [address]    Special Requests   Final    BOTTLES DRAWN AEROBIC ONLY Blood Culture results may not be optimal due to an inadequate volume of blood received in culture bottles Performed at [facility], [address] [place], [place] [address]    Culture  Setup Time   Final    GRAM POSITIVE COCCI IN CLUSTERS AEROBIC BOTTLE ONLY CRITICAL RESULT CALLED TO, READ BACK BY AND VERIFIED WITH: PHARMD J. LEGGE [id number] @1537  fh    Culture (A)  Final    STAPHYLOCOCCUS EPIDERMIDIS THE SIGNIFICANCE OF ISOLATING THIS ORGANISM FROM A SINGLE VENIPUNCTURE CANNOT BE PREDICTED WITHOUT FURTHER CLINICAL AND CULTURE CORRELATION. SUSCEPTIBILITIES AVAILABLE ONLY ON REQUEST. Performed at [facility], [address], [place], [place]    Report Status 03/25/2022 FINAL  Final  Blood Culture ID Panel  (Reflexed)     Status: Abnormal   Collection Time: 03/23/22  6:49 PM  Result Value Ref Range Status   Enterococcus faecalis NOT DETECTED NOT DETECTED Final   Enterococcus Faecium NOT DETECTED NOT DETECTED Final   Listeria monocytogenes NOT DETECTED NOT DETECTED Final   Staphylococcus species DETECTED (A) NOT DETECTED Final    Comment: CRITICAL RESULT CALLED TO, READ BACK BY AND VERIFIED WITH: PHARMD J. LEGGE 05/23/22 @1537  fh    Staphylococcus aureus (BCID) NOT DETECTED NOT DETECTED Final   Staphylococcus epidermidis DETECTED (A) NOT DETECTED Final    Comment: CRITICAL RESULT CALLED TO, READ BACK BY AND VERIFIED WITH: PHARMD J. LEGGE [id number] @1537  fh    Staphylococcus lugdunensis NOT DETECTED NOT DETECTED Final   Streptococcus species NOT DETECTED NOT DETECTED Final   Streptococcus agalactiae NOT DETECTED NOT DETECTED Final   Streptococcus pneumoniae NOT DETECTED NOT DETECTED Final   Streptococcus pyogenes NOT DETECTED NOT DETECTED Final   A.calcoaceticus-baumannii NOT DETECTED NOT DETECTED Final   Bacteroides fragilis NOT DETECTED NOT DETECTED Final   Enterobacterales NOT DETECTED NOT DETECTED Final   Enterobacter cloacae complex NOT DETECTED NOT DETECTED Final   Escherichia coli NOT DETECTED NOT DETECTED Final   Klebsiella aerogenes NOT DETECTED NOT DETECTED Final   Klebsiella oxytoca NOT DETECTED NOT DETECTED Final   Klebsiella pneumoniae NOT DETECTED NOT DETECTED Final   Proteus species NOT DETECTED NOT DETECTED Final   Salmonella species NOT DETECTED NOT DETECTED Final   Serratia marcescens NOT DETECTED NOT DETECTED Final   Haemophilus influenzae NOT DETECTED NOT DETECTED Final   Neisseria meningitidis NOT DETECTED NOT DETECTED Final   Pseudomonas aeruginosa NOT DETECTED NOT DETECTED Final   Stenotrophomonas maltophilia NOT DETECTED NOT DETECTED Final   Candida albicans NOT DETECTED NOT DETECTED Final   Candida auris NOT DETECTED NOT DETECTED Final   Candida glabrata NOT  DETECTED NOT DETECTED Final   Candida krusei NOT DETECTED NOT DETECTED Final   Candida parapsilosis NOT DETECTED NOT DETECTED Final   Candida tropicalis  NOT DETECTED NOT DETECTED Final   Cryptococcus neoformans/gattii NOT DETECTED NOT DETECTED Final   Methicillin resistance mecA/C NOT DETECTED NOT DETECTED Final    Comment: Performed at [facility], [address], [place], [place] [address]         Radiology Studies: No results found.      Scheduled Meds:  brimonidine  1 drop Left Eye BID   busPIRone  10 mg Oral BID   calcium-vitamin D  1 tablet Oral BID   dorzolamide  1 drop Left Eye BID   furosemide  20 mg Oral Daily   insulin aspart  0-9 Units Subcutaneous TID WC   latanoprost  1 drop Both Eyes QHS   lidocaine  1 patch Transdermal Q24H   magnesium oxide  400 mg Oral BID   metoprolol succinate  12.5 mg Oral Daily   pantoprazole  40 mg Oral Daily   predniSONE  7.5 mg Oral Daily   Continuous Infusions:  piperacillin-tazobactam (ZOSYN)  IV 3.375 g (03/26/22 1202)     LOS: 3 days        Kathlen Mody, MD [facility]   To contact the attending provider between 7A-7P or the covering provider during after hours 7P-7A, please log into the web site [email or web address] and access using universal  password for that web site. If you do not have the password, please call the hospital operator.  03/26/2022, 3:14 PM

## 2022-03-26 NOTE — Progress Notes (Signed)
   Subjective/Chief Complaint: No n/v.  Still sore.     Objective: Vital signs in last 24 hours: Temp:  [97.9 F (36.6 C)-98.6 F (37 C)] 98 F (36.7 C) (09/10 0434) Pulse Rate:  [80-83] 83 (09/10 0434) Resp:  [16-18] 16 (09/10 0434) BP: (106-115)/(71-76) 106/71 (09/10 0434) SpO2:  [99 %-100 %] 99 % (09/10 0434) Last BM Date :  (PTA)  Intake/Output from previous day: 09/09 0701 - 09/10 0700 In: 285.8 [IV Piggyback:285.8] Out: 2700 [Urine:2700] Intake/Output this shift: Total I/O In: -  Out: 600 [Urine:600]   General appearance: alert, cooperative, and no distress Resp: breathing normally GI: soft, non distended, mild tenderness epigastrium. No r/g.    Lab Results:  Recent Labs    03/25/22 0724 03/26/22 0542  WBC 13.9* 8.6  HGB 12.3 12.4  HCT 39.2 39.5  PLT 194 209   BMET Recent Labs    03/25/22 0724 03/26/22 0542  NA 139 140  K 4.2 4.3  CL 110 109  CO2 23 22  GLUCOSE 82 86  BUN 15 13  CREATININE 1.10* 0.98  CALCIUM 9.1 9.4   PT/INR Recent Labs    03/25/22 0724 03/26/22 0542  LABPROT 22.9* 20.3*  INR 2.1* 1.8*   ABG No results for input(s): "PHART", "HCO3" in the last 72 hours.  Invalid input(s): "PCO2", "PO2"  Studies/Results: No results found.  Anti-infectives: Anti-infectives (From admission, onward)    Start     Dose/Rate Route Frequency Ordered Stop   03/23/22 2000  piperacillin-tazobactam (ZOSYN) IVPB 3.375 g        3.375 g 12.5 mL/hr over 240 Minutes Intravenous Every 8 hours 03/23/22 1837     03/23/22 1800  piperacillin-tazobactam (ZOSYN) IVPB 3.375 g  Status:  Discontinued        3.375 g 100 mL/hr over 30 Minutes Intravenous Every 6 hours 03/23/22 1540 03/23/22 1838       Assessment/Plan: Acute on chronic cholecystitis, s/p perc chole tube 01/30/2022, dislodged 03/11/2022.  Was going to leave out until follow up appt with Dr. Magnus Ivan but she came back to ED with s/s of acute cholecystitis.  Symptoms are mild at this  point.  WBCs improving.    Holding coumadin for INR to be 1.5 or less for replacement of tube. IR following.    Still 1.8 today.  Will resume diet and npo after midnight again tonight.       LOS: 3 days   Maudry Diego, MD FACS Surgical Oncology, General Surgery, Trauma and Critical [facility] Surgery, [place] [phone] for weekday/non holidays Check [email or web address] for coverage night/weekend/holidays

## 2022-03-26 NOTE — Progress Notes (Signed)
Interventional Radiology Brief Note:  IR following for plans to replace percutaneous cholecystostomy tube once INR <1.6.  Recheck this AM shows INR decreased from 2.1  1.8.  Patient with slight improvement in WBC from 13  8.6K, now WNL. Remains afebrile.   Ongoing abdominal pain.   Continue with current management.  Will make NPO p MN for possible procedure tomorrow.  Surgery following.   Loyce Dys, MS RD PA-C

## 2022-03-27 DIAGNOSIS — I4811 Longstanding persistent atrial fibrillation: Secondary | ICD-10-CM | POA: Diagnosis not present

## 2022-03-27 DIAGNOSIS — I5032 Chronic diastolic (congestive) heart failure: Secondary | ICD-10-CM | POA: Diagnosis not present

## 2022-03-27 DIAGNOSIS — K812 Acute cholecystitis with chronic cholecystitis: Secondary | ICD-10-CM | POA: Diagnosis not present

## 2022-03-27 DIAGNOSIS — E1165 Type 2 diabetes mellitus with hyperglycemia: Secondary | ICD-10-CM | POA: Diagnosis not present

## 2022-03-27 LAB — CBC WITH DIFFERENTIAL/PLATELET
Abs Immature Granulocytes: 0.03 10*3/uL (ref 0.00–0.07)
Basophils Absolute: 0.1 10*3/uL (ref 0.0–0.1)
Basophils Relative: 1 %
Eosinophils Absolute: 0.1 10*3/uL (ref 0.0–0.5)
Eosinophils Relative: 1 %
HCT: 40.2 % (ref 36.0–46.0)
Hemoglobin: 12.6 g/dL (ref 12.0–15.0)
Immature Granulocytes: 0 %
Lymphocytes Relative: 24 %
Lymphs Abs: 1.9 10*3/uL (ref 0.7–4.0)
MCH: 27 pg (ref 26.0–34.0)
MCHC: 31.3 g/dL (ref 30.0–36.0)
MCV: 86.3 fL (ref 80.0–100.0)
Monocytes Absolute: 0.8 10*3/uL (ref 0.1–1.0)
Monocytes Relative: 10 %
Neutro Abs: 5.3 10*3/uL (ref 1.7–7.7)
Neutrophils Relative %: 64 %
Platelets: 236 10*3/uL (ref 150–400)
RBC: 4.66 MIL/uL (ref 3.87–5.11)
RDW: 17.4 % — ABNORMAL HIGH (ref 11.5–15.5)
WBC: 8.3 10*3/uL (ref 4.0–10.5)
nRBC: 0 % (ref 0.0–0.2)

## 2022-03-27 LAB — BASIC METABOLIC PANEL
Anion gap: 8 (ref 5–15)
BUN: 16 mg/dL (ref 8–23)
CO2: 25 mmol/L (ref 22–32)
Calcium: 9.5 mg/dL (ref 8.9–10.3)
Chloride: 106 mmol/L (ref 98–111)
Creatinine, Ser: 0.99 mg/dL (ref 0.44–1.00)
GFR, Estimated: 58 mL/min — ABNORMAL LOW (ref >60)
Glucose, Bld: 91 mg/dL (ref 70–99)
Potassium: 4 mmol/L (ref 3.5–5.1)
Sodium: 139 mmol/L (ref 135–145)

## 2022-03-27 LAB — GLUCOSE, CAPILLARY
Glucose-Capillary: 97 mg/dL (ref 70–99)
Glucose-Capillary: 126 mg/dL — ABNORMAL HIGH (ref 70–99)
Glucose-Capillary: 145 mg/dL — ABNORMAL HIGH (ref 70–99)
Glucose-Capillary: 81 mg/dL (ref 70–99)

## 2022-03-27 LAB — PROTIME-INR
INR: 1.8 — ABNORMAL HIGH (ref 0.8–1.2)
Prothrombin Time: 20.6 seconds — ABNORMAL HIGH (ref 11.4–15.2)

## 2022-03-27 NOTE — Progress Notes (Signed)
   Subjective/Chief Complaint: Denies abdominal pain, nausea, or vomiting.  Objective: Vital signs in last 24 hours: Temp:  [97.9 F (36.6 C)-98.1 F (36.7 C)] 98 F (36.7 C) (09/11 0418) Pulse Rate:  [83-101] 83 (09/11 0418) Resp:  [18-20] 20 (09/11 0418) BP: (94-106)/(64-76) 105/64 (09/11 0418) SpO2:  [95 %-100 %] 99 % (09/11 0418) Last BM Date : 03/23/22  Intake/Output from previous day: 09/10 0701 - 09/11 0700 In: -  Out: 1600 [Urine:1600] Intake/Output this shift: Total I/O In: 306.2 [IV Piggyback:306.2] Out: -    General appearance: alert, cooperative, and no distress Resp: breathing normally GI: soft, non distended, nontender   Lab Results:  Recent Labs    03/26/22 0542 03/27/22 0456  WBC 8.6 8.3  HGB 12.4 12.6  HCT 39.5 40.2  PLT 209 236   BMET Recent Labs    03/26/22 0542 03/27/22 0456  NA 140 139  K 4.3 4.0  CL 109 106  CO2 22 25  GLUCOSE 86 91  BUN 13 16  CREATININE 0.98 0.99  CALCIUM 9.4 9.5   PT/INR Recent Labs    03/25/22 0724 03/26/22 0542  LABPROT 22.9* 20.3*  INR 2.1* 1.8*   ABG No results for input(s): "PHART", "HCO3" in the last 72 hours.  Invalid input(s): "PCO2", "PO2"  Studies/Results: No results found.  Anti-infectives: Anti-infectives (From admission, onward)    Start     Dose/Rate Route Frequency Ordered Stop   03/23/22 2000  piperacillin-tazobactam (ZOSYN) IVPB 3.375 g        3.375 g 12.5 mL/hr over 240 Minutes Intravenous Every 8 hours 03/23/22 1837     03/23/22 1800  piperacillin-tazobactam (ZOSYN) IVPB 3.375 g  Status:  Discontinued        3.375 g 100 mL/hr over 30 Minutes Intravenous Every 6 hours 03/23/22 1540 03/23/22 1838       Assessment/Plan: Acute on chronic cholecystitis, s/p perc chole tube 01/30/2022, dislodged 03/11/2022.  Was going to leave out until follow up appt with Dr. Magnus Ivan but she came back to ED with s/s of acute cholecystitis.  Symptoms are mild at this point.  WBCs improving.     Holding coumadin for INR to be 1.5 or less for replacement of tube. IR following.  INR pending this morning.       LOS: 4 days   Hosie Spangle, PA-C  General & Trauma Surgery  [facility] Surgery, [place] [phone] for weekday/non holidays Check [email or web address] for coverage night/weekend/holidays

## 2022-03-27 NOTE — Progress Notes (Signed)
Patient ID: Jill Crane, female   DOB: 06-28-1941, 81 y.o.   MRN: [id number] Pt's PT/INR today is 20.6/1.8 which is still too elevated for perc cholecystostomy placement; will cont to monitor and recheck labs in am; pt's nurse and [facility] notified

## 2022-03-27 NOTE — Progress Notes (Signed)
Mobility Specialist - Progress Note   03/27/22 1000  Mobility  Activity Stood at bedside  Level of Assistance Contact guard assist, steadying assist  Assistive Device Front wheel walker  Distance Ambulated (ft) 2 ft  Activity Response Tolerated well  $Mobility charge 1 Mobility   Pt received in bed and agreed for mobility. C/o soreness and pain in both feet. Did 3 sit to stands in attempts to ambulate. Twice with shoes, once without them and still soreness and pain in feet. Pt returned to bed with all needs met and NT in room. Will try again if schedule permits.  Roderick Pee Mobility Specialist

## 2022-03-27 NOTE — Progress Notes (Signed)
PROGRESS NOTE    [name]  INO:[id number] DOB: September 20, 1940 DOA: 03/23/2022 PCP: Zoila Shutter, MD    Chief Complaint  Patient presents with   Abdominal Pain    Brief Narrative:  Jill Crane is a 81 y.o. 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 A year ago she had a stone in her bile duct and underwent ERCP for removal of this. She refused a cholecystectomy after that. She then presented this year in July with sepsis secondary to acute cholecystitis.  she underwent percutaneous cholecystostomy tube placemen on 01/30/2022. She was due to have a cholangiogram but the drain inadvertently fell out 03/13/2022.She begin to have symptoms of abdominal discomfort and had an episode of vomiting 2 days ago.  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  No loculated fluid collection.  General surgery, IR and cardiology consulted and on board.   Assessment & Plan:   Principal Problem:   Acute on chronic cholecystitis Active Problems:   Atrial fibrillation (HCC)   Diabetes (HCC)   AKI (acute kidney injury) (HCC)   Chronic diastolic CHF (congestive heart failure) (HCC)   Hypotension   History of pemphigus   Acute on chronic cholecystitis:  - CT abd and pelvis reveal recurrence of cholecystitis.  - started on IV zosyn, continue with IV fluids and IV pain control. - gen surgery and IR on board.  - cardiology consulted for clearance, appreciate recommendations.  - waiting for INR to improve, to plan perc cholecystotomy tube by IR tomorrow.  - INR is 1.8 today, when less than 1.6 , plan for perc drain placement.  - clears for now and NPO after midnight today.  - improving leukocytosis.  - pt denies any nausea, vomiting and abdominal pain.   H/o Pemphigus:  Continue with prednisone.     Hypotension; resolved.  BP parameters are optimal.    H/o chronic diastolic Chf She appears to be compensated.    AKI;  Creatinine back to baseline    Atrial fibrillation  On coumadin for anti coagulation, which is on hold. INR is 1.8 today. Continue to monitor.  Rate controlled with metoprolol 12.5 mg daily.    Type 2 DM cbgs are better controlled.  CBG (last 3)  Recent Labs    03/26/22 2140 03/27/22 0749 03/27/22 1229  GLUCAP 198* 97 126*    Continue with SSI.  Last A1c is 6.8%  COPD:  No wheezing heard.    Staph epidermidis bacteremia:  On IV zosyn.  Continue to monitor.  Suspect a contaminant.     DVT prophylaxis: scd's Code Status: full code.  Family Communication: none at bedside.  Disposition:   Status is: Inpatient Remains inpatient appropriate because: will need IR perc drain on Monday.    Level of care: Telemetry Consultants:  General surgery IR.  Cardiology.   Procedures: CT abd and pelvis.   Antimicrobials:  Antibiotics Given (last 72 hours)     Date/Time Action Medication Dose Rate   03/24/22 2033 New Bag/Given   piperacillin-tazobactam (ZOSYN) IVPB 3.375 g 3.375 g 12.5 mL/hr   03/25/22 0429 New Bag/Given   piperacillin-tazobactam (ZOSYN) IVPB 3.375 g 3.375 g 12.5 mL/hr   03/25/22 1201 New Bag/Given   piperacillin-tazobactam (ZOSYN) IVPB 3.375 g 3.375 g 12.5 mL/hr   03/25/22 2003 New Bag/Given   piperacillin-tazobactam (ZOSYN) IVPB 3.375 g 3.375  g 12.5 mL/hr   03/26/22 0428 New Bag/Given   piperacillin-tazobactam (ZOSYN) IVPB 3.375 g 3.375 g 12.5 mL/hr   03/26/22 1202 New Bag/Given   piperacillin-tazobactam (ZOSYN) IVPB 3.375 g 3.375 g 12.5 mL/hr   03/26/22 2023 New Bag/Given   piperacillin-tazobactam (ZOSYN) IVPB 3.375 g 3.375 g 12.5 mL/hr   03/27/22 0338 New Bag/Given   piperacillin-tazobactam (ZOSYN) IVPB 3.375 g 3.375 g 12.5 mL/hr   03/27/22 1133 New Bag/Given   piperacillin-tazobactam (ZOSYN) IVPB 3.375 g 3.375 g  12.5 mL/hr          Subjective: No nausea, vomiting and abdominal pain.   Objective: Vitals:   03/26/22 0434 03/26/22 1404 03/26/22 1947 03/27/22 0418  BP: 106/71 94/64 106/76 105/64  Pulse: 83 99 (!) 101 83  Resp: 16 18  20   Temp: 98 F (36.7 C) 97.9 F (36.6 C) 98.1 F (36.7 C) 98 F (36.7 C)  TempSrc: Oral Oral Oral Oral  SpO2: 99% 100% 95% 99%  Weight:      Height:        Intake/Output Summary (Last 24 hours) at 03/27/2022 1248 Last data filed at 03/27/2022 1236 Gross per 24 hour  Intake 306.17 ml  Output 1400 ml  Net -1093.83 ml    Filed Weights   03/23/22 1339  Weight: 68 kg    Examination:  General exam: Appears calm and comfortable  Respiratory system: Clear to auscultation. Respiratory effort normal. Cardiovascular system: S1 & S2 heard, RRR. No JVD,  No pedal edema. Gastrointestinal system: Abdomen is nondistended, soft and nontender. Normal bowel sounds heard. Central nervous system: Alert and oriented. No focal neurological deficits. Extremities: Symmetric 5 x 5 power. Skin: No rashes, lesions or ulcers Psychiatry:  Mood & affect appropriate.       Data Reviewed: I have personally reviewed following labs and imaging studies  CBC: Recent Labs  Lab 03/23/22 1401 03/24/22 0556 03/25/22 0724 03/26/22 0542 03/27/22 0456  WBC 14.7* 17.4* 13.9* 8.6 8.3  NEUTROABS  --   --  10.8* 6.0 5.3  HGB 12.9 12.9 12.3 12.4 12.6  HCT 39.8 40.1 39.2 39.5 40.2  MCV 83.8 86.6 85.4 86.1 86.3  PLT 192 176 194 209 236     Basic Metabolic Panel: Recent Labs  Lab 03/23/22 1401 03/24/22 0556 03/25/22 0724 03/26/22 0542 03/27/22 0456  NA 134* 136 139 140 139  K 4.1 4.3 4.2 4.3 4.0  CL 107 110 110 109 106  CO2 19* 21* 23 22 25   GLUCOSE 189* 92 82 86 91  BUN 16 15 15 13 16   CREATININE 1.10* 0.99 1.10* 0.98 0.99  CALCIUM 8.4* 8.2* 9.1 9.4 9.5     GFR: Estimated Creatinine Clearance: 41 mL/min (by C-G formula based on SCr of 0.99 mg/dL).  Liver  Function Tests: Recent Labs  Lab 03/23/22 1401  AST 20  ALT 10  ALKPHOS 69  BILITOT 1.7*  PROT 6.7  ALBUMIN 3.0*     CBG: Recent Labs  Lab 03/26/22 1153 03/26/22 1639 03/26/22 2140 03/27/22 0749 03/27/22 1229  GLUCAP 208* 160* 198* 97 126*      Recent Results (from the past 240 hour(s))  Culture, blood (routine x 2)     Status: None (Preliminary result)   Collection Time: 03/23/22  4:31 PM   Specimen: BLOOD RIGHT FOREARM  Result Value Ref Range Status   Specimen Description   Final    BLOOD RIGHT FOREARM Performed at [facility], [address] [place],  [place] [address]    Special Requests   Final    BOTTLES DRAWN AEROBIC AND ANAEROBIC Blood Culture adequate volume Performed at [facility], [address], [place], [place] [address]    Culture   Final    NO GROWTH 4 DAYS Performed at [facility], [address], [place], [place] [address]    Report Status PENDING  Incomplete  Culture, blood (routine x 2)     Status: Abnormal   Collection Time: 03/23/22  6:49 PM   Specimen: BLOOD LEFT HAND  Result Value Ref Range Status   Specimen Description   Final    BLOOD LEFT HAND Performed at [facility], [address], [place], [place] [address]    Special Requests   Final    BOTTLES DRAWN AEROBIC ONLY Blood Culture results may not be optimal due to an inadequate volume of blood received in culture bottles Performed at [facility], [address] [place], [place] [address]    Culture  Setup Time   Final    GRAM POSITIVE COCCI IN CLUSTERS AEROBIC BOTTLE ONLY CRITICAL RESULT CALLED TO, READ BACK BY AND VERIFIED WITH: PHARMD J. LEGGE [id number] @1537  fh    Culture (A)  Final    STAPHYLOCOCCUS EPIDERMIDIS THE SIGNIFICANCE OF ISOLATING THIS ORGANISM FROM A SINGLE VENIPUNCTURE CANNOT BE PREDICTED WITHOUT FURTHER CLINICAL AND CULTURE CORRELATION. SUSCEPTIBILITIES AVAILABLE ONLY ON REQUEST. Performed at [facility], [address], [place], [place]    Report Status 03/25/2022 FINAL  Final  Blood Culture ID Panel (Reflexed)     Status: Abnormal   Collection Time: 03/23/22  6:49 PM  Result Value Ref Range Status   Enterococcus faecalis NOT DETECTED NOT DETECTED Final   Enterococcus Faecium NOT DETECTED NOT DETECTED Final   Listeria monocytogenes NOT DETECTED NOT DETECTED Final   Staphylococcus species DETECTED (A) NOT DETECTED Final    Comment: CRITICAL RESULT CALLED TO, READ BACK BY AND VERIFIED WITH: PHARMD J. LEGGE 05/23/22 @1537  fh    Staphylococcus aureus (BCID) NOT DETECTED NOT DETECTED Final   Staphylococcus epidermidis DETECTED (A) NOT DETECTED Final    Comment: CRITICAL RESULT CALLED TO, READ BACK BY AND VERIFIED WITH: PHARMD J. [id number] @1537  fh    Staphylococcus lugdunensis NOT DETECTED NOT DETECTED Final   Streptococcus species NOT DETECTED NOT DETECTED Final   Streptococcus agalactiae NOT DETECTED NOT DETECTED Final   Streptococcus pneumoniae NOT DETECTED NOT DETECTED Final   Streptococcus pyogenes NOT DETECTED NOT DETECTED Final   A.calcoaceticus-baumannii NOT DETECTED NOT DETECTED Final   Bacteroides fragilis NOT DETECTED NOT DETECTED Final   Enterobacterales NOT DETECTED NOT DETECTED Final   Enterobacter cloacae complex NOT DETECTED NOT DETECTED Final   Escherichia coli NOT DETECTED NOT DETECTED Final   Klebsiella aerogenes NOT DETECTED NOT DETECTED Final   Klebsiella oxytoca NOT DETECTED NOT DETECTED Final   Klebsiella pneumoniae NOT DETECTED NOT DETECTED Final   Proteus species NOT DETECTED NOT DETECTED Final   Salmonella species NOT DETECTED NOT DETECTED Final   Serratia marcescens NOT DETECTED NOT DETECTED Final   Haemophilus influenzae NOT DETECTED NOT DETECTED Final   Neisseria meningitidis NOT DETECTED NOT DETECTED Final   Pseudomonas aeruginosa NOT DETECTED NOT DETECTED Final   Stenotrophomonas maltophilia NOT DETECTED NOT DETECTED Final   Candida  albicans NOT DETECTED NOT DETECTED Final   Candida auris NOT DETECTED NOT DETECTED Final   Candida glabrata NOT DETECTED NOT DETECTED  Final   Candida krusei NOT DETECTED NOT DETECTED Final   Candida parapsilosis NOT DETECTED NOT DETECTED Final   Candida tropicalis NOT DETECTED NOT DETECTED Final   Cryptococcus neoformans/gattii NOT DETECTED NOT DETECTED Final   Methicillin resistance mecA/C NOT DETECTED NOT DETECTED Final    Comment: Performed at [facility], [address] [place], [place] [address]         Radiology Studies: No results found.      Scheduled Meds:  brimonidine  1 drop Left Eye BID   busPIRone  10 mg Oral BID   calcium-vitamin D  1 tablet Oral BID   dorzolamide  1 drop Left Eye BID   furosemide  20 mg Oral Daily   insulin aspart  0-9 Units Subcutaneous TID WC   latanoprost  1 drop Both Eyes QHS   lidocaine  1 patch Transdermal Q24H   magnesium oxide  400 mg Oral BID   metoprolol succinate  12.5 mg Oral Daily   pantoprazole  40 mg Oral Daily   predniSONE  7.5 mg Oral Daily   Continuous Infusions:  piperacillin-tazobactam (ZOSYN)  IV 3.375 g (03/27/22 1133)     LOS: 4 days        Kathlen Mody, MD [facility]   To contact the attending provider between 7A-7P or the covering provider during after hours 7P-7A, please log into the web site [email or web address] and access using universal [place] password for that web site. If you do not have the password, please call the hospital operator.  03/27/2022, 12:48 PM

## 2022-03-28 ENCOUNTER — Inpatient Hospital Stay (HOSPITAL_COMMUNITY): Payer: Medicare Other

## 2022-03-28 DIAGNOSIS — E1165 Type 2 diabetes mellitus with hyperglycemia: Secondary | ICD-10-CM | POA: Diagnosis not present

## 2022-03-28 DIAGNOSIS — I5032 Chronic diastolic (congestive) heart failure: Secondary | ICD-10-CM | POA: Diagnosis not present

## 2022-03-28 DIAGNOSIS — K812 Acute cholecystitis with chronic cholecystitis: Secondary | ICD-10-CM | POA: Diagnosis not present

## 2022-03-28 DIAGNOSIS — I4811 Longstanding persistent atrial fibrillation: Secondary | ICD-10-CM | POA: Diagnosis not present

## 2022-03-28 HISTORY — PX: IR PERC CHOLECYSTOSTOMY: IMG2326

## 2022-03-28 LAB — PROTIME-INR
INR: 1.5 — ABNORMAL HIGH (ref 0.8–1.2)
Prothrombin Time: 17.5 seconds — ABNORMAL HIGH (ref 11.4–15.2)

## 2022-03-28 LAB — CULTURE, BLOOD (ROUTINE X 2)
Culture: NO GROWTH
Special Requests: ADEQUATE

## 2022-03-28 LAB — GLUCOSE, CAPILLARY
Glucose-Capillary: 88 mg/dL (ref 70–99)
Glucose-Capillary: 85 mg/dL (ref 70–99)
Glucose-Capillary: 91 mg/dL (ref 70–99)
Glucose-Capillary: 162 mg/dL — ABNORMAL HIGH (ref 70–99)

## 2022-03-28 MED ORDER — FENTANYL CITRATE (PF) 100 MCG/2ML IJ SOLN
INTRAMUSCULAR | Status: AC | PRN
  Administered 2022-03-28 (×2): 25 ug via INTRAVENOUS

## 2022-03-28 MED ORDER — MIDAZOLAM HCL 2 MG/2ML IJ SOLN
INTRAMUSCULAR | Status: AC | PRN
  Administered 2022-03-28: .5 mg via INTRAVENOUS

## 2022-03-28 MED ORDER — MIDAZOLAM HCL 2 MG/2ML IJ SOLN
INTRAMUSCULAR | Status: AC
  Filled 2022-03-28: qty 2

## 2022-03-28 MED ORDER — GABAPENTIN 300 MG PO CAPS
300.0000 mg | ORAL_CAPSULE | Freq: Two times a day (BID) | ORAL | Status: DC
  Administered 2022-03-28 – 2022-03-30 (×4): 300 mg via ORAL
  Filled 2022-03-28 (×4): qty 1

## 2022-03-28 MED ORDER — IOHEXOL 300 MG/ML  SOLN
100.0000 mL | Freq: Once | INTRAMUSCULAR | Status: AC | PRN
  Administered 2022-03-28: 15 mL

## 2022-03-28 MED ORDER — FENTANYL CITRATE (PF) 100 MCG/2ML IJ SOLN
INTRAMUSCULAR | Status: AC
  Filled 2022-03-28: qty 2

## 2022-03-28 MED ORDER — LIDOCAINE HCL 1 % IJ SOLN
INTRAMUSCULAR | Status: AC
  Filled 2022-03-28: qty 20

## 2022-03-28 MED ORDER — LIDOCAINE HCL (PF) 1 % IJ SOLN
INTRAMUSCULAR | Status: AC | PRN
  Administered 2022-03-28: 10 mL

## 2022-03-28 NOTE — Progress Notes (Signed)
PROGRESS NOTE    Jill Crane  BZJ:[id number] DOB: [birth year] DOA: 03/23/2022 PCP: Zoila Shutter, MD    Chief Complaint  Patient presents with   Abdominal Pain    Brief Narrative:  Jill Crane is a 81 y.o. 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 A year ago she had a stone in her bile duct and underwent ERCP for removal of this. She refused a cholecystectomy after that. She then presented this year in July with sepsis secondary to acute cholecystitis.  she underwent percutaneous cholecystostomy tube placemen on 01/30/2022. She was due to have a cholangiogram but the drain inadvertently fell out 03/13/2022.She begin to have symptoms of abdominal discomfort and had an episode of vomiting 2 days ago.  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  No loculated fluid collection.  General surgery, IR and cardiology consulted and on board. Patient seen and examined at bedside.  Her INR is 1.5 , plan for cholecystostomy tube placement today.   Assessment & Plan:   Principal Problem:   Acute on chronic cholecystitis Active Problems:   Atrial fibrillation (HCC)   Diabetes (HCC)   AKI (acute kidney injury) (HCC)   Chronic diastolic CHF (congestive heart failure) (HCC)   Hypotension   History of pemphigus   Acute on chronic cholecystitis:  - CT abd and pelvis reveal recurrence of cholecystitis.  - started on IV zosyn, pain control.  - gen surgery and IR on board.  - she has been NPO since midnight.  - INR is 1.5, plan for cholecystostomy tube placement by IR today.  - she remains afebrile, no leukocytosis. - pt denies any nausea, vomiting and abdominal pain.   H/o Pemphigus:  Continue with prednisone.    Hypotension; resolved.  BP parameters are well controlled.   H/o chronic  diastolic Chf She appears to be compensated.  Continue with lasix 20 mg daiy.    AKI;  Creatinine back to baseline    Atrial fibrillation  On coumadin for anti coagulation, which is on hold.  Continue to monitor.  Rate controlled with metoprolol 12.5 mg daily.  INR is 1.5 today.    Type 2 DM cbgs are better controlled.  CBG (last 3)  Recent Labs    03/27/22 2141 03/28/22 0815 03/28/22 1204  GLUCAP 81 88 85    Continue with SSI.  Last A1c is 6.8%. no change in meds. O metformin at home.   Diabetic neuropathy:  She is on gabapentin 300 mg prn at bedtime. Will increase the dose to 300 mg BID .   COPD:  No wheezing heard. Pt on RA with good sats.    Staph epidermidis bacteremia:  In 1 out of 4 bottles.  On IV zosyn already.     DVT prophylaxis: scd's Code Status: full code.  Family Communication: none at bedside.  Disposition:   Status is: Inpatient Remains inpatient appropriate because: will need IR perc drain on Monday.    Level of care: Telemetry Consultants:  General surgery IR.  Cardiology.   Procedures: CT abd and pelvis.   Antimicrobials:  Antibiotics Given (last 72 hours)     Date/Time Action Medication Dose Rate   03/25/22 2003 New Bag/Given   piperacillin-tazobactam (ZOSYN) IVPB 3.375 g 3.375 g 12.5 mL/hr   03/26/22 0428 New Bag/Given   piperacillin-tazobactam (ZOSYN) IVPB 3.375 g 3.375  g 12.5 mL/hr   03/26/22 1202 New Bag/Given   piperacillin-tazobactam (ZOSYN) IVPB 3.375 g 3.375 g 12.5 mL/hr   03/26/22 2023 New Bag/Given   piperacillin-tazobactam (ZOSYN) IVPB 3.375 g 3.375 g 12.5 mL/hr   03/27/22 0338 New Bag/Given   piperacillin-tazobactam (ZOSYN) IVPB 3.375 g 3.375 g 12.5 mL/hr   03/27/22 1133 New Bag/Given   piperacillin-tazobactam (ZOSYN) IVPB 3.375 g 3.375 g 12.5 mL/hr   03/27/22 1958 New Bag/Given   piperacillin-tazobactam (ZOSYN) IVPB 3.375 g 3.375 g 12.5 mL/hr   03/28/22 0320 New Bag/Given   piperacillin-tazobactam (ZOSYN)  IVPB 3.375 g 3.375 g 12.5 mL/hr          Subjective: No nausea, vomiting or abdominal pain.   Objective: Vitals:   03/27/22 1331 03/27/22 1944 03/28/22 0450 03/28/22 0450  BP: 107/72 108/69 (!) 107/94 (!) 107/94  Pulse: 81 67 65 75  Resp: 20 16 18 18   Temp: (!) 97.3 F (36.3 C) 98 F (36.7 C) 97.8 F (36.6 C) 97.8 F (36.6 C)  TempSrc: Oral Oral Oral Oral  SpO2: 99% 99% 98% 100%  Weight:      Height:        Intake/Output Summary (Last 24 hours) at 03/28/2022 1228 Last data filed at 03/28/2022 0055 Gross per 24 hour  Intake 1208 ml  Output 1850 ml  Net -642 ml    Filed Weights   03/23/22 1339  Weight: 68 kg    Examination:  General exam: Appears calm and comfortable  Respiratory system: Clear to auscultation. Respiratory effort normal. Cardiovascular system: S1 & S2 heard, RRR. No JVD,No pedal edema. Gastrointestinal system: Abdomen is nondistended, soft and nontender.  Normal bowel sounds heard. Central nervous system: Alert and oriented. No focal neurological deficits. Extremities: bilateral foot pain suspect from neuropathy.  Skin: No rashes, lesions or ulcers Psychiatry: Judgement and insight appear normal. Mood & affect appropriate.        Data Reviewed: I have personally reviewed following labs and imaging studies  CBC: Recent Labs  Lab 03/23/22 1401 03/24/22 0556 03/25/22 0724 03/26/22 0542 03/27/22 0456  WBC 14.7* 17.4* 13.9* 8.6 8.3  NEUTROABS  --   --  10.8* 6.0 5.3  HGB 12.9 12.9 12.3 12.4 12.6  HCT 39.8 40.1 39.2 39.5 40.2  MCV 83.8 86.6 85.4 86.1 86.3  PLT 192 176 194 209 236     Basic Metabolic Panel: Recent Labs  Lab 03/23/22 1401 03/24/22 0556 03/25/22 0724 03/26/22 0542 03/27/22 0456  NA 134* 136 139 140 139  K 4.1 4.3 4.2 4.3 4.0  CL 107 110 110 109 106  CO2 19* 21* 23 22 25   GLUCOSE 189* 92 82 86 91  BUN 16 15 15 13 16   CREATININE 1.10* 0.99 1.10* 0.98 0.99  CALCIUM 8.4* 8.2* 9.1 9.4 9.5     GFR: Estimated  Creatinine Clearance: 41 mL/min (by C-G formula based on SCr of 0.99 mg/dL).  Liver Function Tests: Recent Labs  Lab 03/23/22 1401  AST 20  ALT 10  ALKPHOS 69  BILITOT 1.7*  PROT 6.7  ALBUMIN 3.0*     CBG: Recent Labs  Lab 03/27/22 1229 03/27/22 1728 03/27/22 2141 03/28/22 0815 03/28/22 1204  GLUCAP 126* 145* 81 88 85      Recent Results (from the past 240 hour(s))  Culture, blood (routine x 2)     Status: None   Collection Time: 03/23/22  4:31 PM   Specimen: BLOOD RIGHT FOREARM  Result Value Ref Range Status  Specimen Description   Final    BLOOD RIGHT FOREARM Performed at [facility], [address] [place], [place] [address]    Special Requests   Final    BOTTLES DRAWN AEROBIC AND ANAEROBIC Blood Culture adequate volume Performed at [facility], [address] [place], [place] [address]    Culture   Final    NO GROWTH 5 DAYS Performed at [facility], [address] [place], [place] [address]    Report Status 03/28/2022 FINAL  Final  Culture, blood (routine x 2)     Status: Abnormal   Collection Time: 03/23/22  6:49 PM   Specimen: BLOOD LEFT HAND  Result Value Ref Range Status   Specimen Description   Final    BLOOD LEFT HAND Performed at [facility], [address], [place], [place] [address]    Special Requests   Final    BOTTLES DRAWN AEROBIC ONLY Blood Culture results may not be optimal due to an inadequate volume of blood received in culture bottles Performed at [facility], [address] [place], [place] [address]    Culture  Setup Time   Final    GRAM POSITIVE COCCI IN CLUSTERS AEROBIC BOTTLE ONLY CRITICAL RESULT CALLED TO, READ BACK BY AND VERIFIED WITH: PHARMD J. LEGGE [id number] @1537  fh    Culture (A)  Final    STAPHYLOCOCCUS EPIDERMIDIS THE SIGNIFICANCE OF ISOLATING THIS ORGANISM FROM A SINGLE VENIPUNCTURE CANNOT BE PREDICTED WITHOUT FURTHER CLINICAL AND CULTURE CORRELATION.  SUSCEPTIBILITIES AVAILABLE ONLY ON REQUEST. Performed at [facility], [address], [place], [place]    Report Status 03/25/2022 FINAL  Final  Blood Culture ID Panel (Reflexed)     Status: Abnormal   Collection Time: 03/23/22  6:49 PM  Result Value Ref Range Status   Enterococcus faecalis NOT DETECTED NOT DETECTED Final   Enterococcus Faecium NOT DETECTED NOT DETECTED Final   Listeria monocytogenes NOT DETECTED NOT DETECTED Final   Staphylococcus species DETECTED (A) NOT DETECTED Final    Comment: CRITICAL RESULT CALLED TO, READ BACK BY AND VERIFIED WITH: PHARMD J. LEGGE 05/23/22 @1537  fh    Staphylococcus aureus (BCID) NOT DETECTED NOT DETECTED Final   Staphylococcus epidermidis DETECTED (A) NOT DETECTED Final    Comment: CRITICAL RESULT CALLED TO, READ BACK BY AND VERIFIED WITH: PHARMD J. LEGGE [id number] @1537  fh    Staphylococcus lugdunensis NOT DETECTED NOT DETECTED Final   Streptococcus species NOT DETECTED NOT DETECTED Final   Streptococcus agalactiae NOT DETECTED NOT DETECTED Final   Streptococcus pneumoniae NOT DETECTED NOT DETECTED Final   Streptococcus pyogenes NOT DETECTED NOT DETECTED Final   A.calcoaceticus-baumannii NOT DETECTED NOT DETECTED Final   Bacteroides fragilis NOT DETECTED NOT DETECTED Final   Enterobacterales NOT DETECTED NOT DETECTED Final   Enterobacter cloacae complex NOT DETECTED NOT DETECTED Final   Escherichia coli NOT DETECTED NOT DETECTED Final   Klebsiella aerogenes NOT DETECTED NOT DETECTED Final   Klebsiella oxytoca NOT DETECTED NOT DETECTED Final   Klebsiella pneumoniae NOT DETECTED NOT DETECTED Final   Proteus species NOT DETECTED NOT DETECTED Final   Salmonella species NOT DETECTED NOT DETECTED Final   Serratia marcescens NOT DETECTED NOT DETECTED Final   Haemophilus influenzae NOT DETECTED NOT DETECTED Final   Neisseria meningitidis NOT DETECTED NOT DETECTED Final   Pseudomonas aeruginosa NOT DETECTED NOT DETECTED Final    Stenotrophomonas maltophilia NOT DETECTED NOT DETECTED Final  Candida albicans NOT DETECTED NOT DETECTED Final   Candida auris NOT DETECTED NOT DETECTED Final   Candida glabrata NOT DETECTED NOT DETECTED Final   Candida krusei NOT DETECTED NOT DETECTED Final   Candida parapsilosis NOT DETECTED NOT DETECTED Final   Candida tropicalis NOT DETECTED NOT DETECTED Final   Cryptococcus neoformans/gattii NOT DETECTED NOT DETECTED Final   Methicillin resistance mecA/C NOT DETECTED NOT DETECTED Final    Comment: Performed at [facility], [address], [place], [place] [address]         Radiology Studies: No results found.      Scheduled Meds:  brimonidine  1 drop Left Eye BID   busPIRone  10 mg Oral BID   calcium-vitamin D  1 tablet Oral BID   dorzolamide  1 drop Left Eye BID   furosemide  20 mg Oral Daily   insulin aspart  0-9 Units Subcutaneous TID WC   latanoprost  1 drop Both Eyes QHS   lidocaine  1 patch Transdermal Q24H   magnesium oxide  400 mg Oral BID   metoprolol succinate  12.5 mg Oral Daily   pantoprazole  40 mg Oral Daily   predniSONE  7.5 mg Oral Daily   Continuous Infusions:  piperacillin-tazobactam (ZOSYN)  IV 3.375 g (03/28/22 0320)     LOS: 5 days        Kathlen Mody, MD [facility]   To contact the attending provider between 7A-7P or the covering provider during after hours 7P-7A, please log into the web site [email or web address] and access using universal [place] password for that web site. If you do not have the password, please call the hospital operator.  03/28/2022, 12:28 PM

## 2022-03-28 NOTE — Procedures (Signed)
Vascular and Interventional Radiology Procedure Note  Patient: Jill Crane DOB: 01/19/1941 Medical Record Number: [id number] Note Date/Time: 03/28/22 5:23 PM   Performing Physician: Roanna Banning, MD Assistant(s): None  Diagnosis: Cholecystitis. Inadvertent drain removal.  Procedure:  CHOLECYSTOSTOMY TUBE PLACEMENT ANTEROGRADE CHOLANGIOGRAM  Anesthesia: Conscious Sedation Complications: None Estimated Blood Loss: Minimal Specimens:  None  Findings:  Successful placement of 6F cholecystostomy tube.  Plan: Flush tube w 10 mL sterile NS and record drain output qShift. Follow up for routine tube evaluation in 8 wks.   See detailed procedure note with images in PACS. The patient tolerated the procedure well without incident or complication and was returned to Floor Bed in stable condition.    Roanna Banning, MD [facility]   Pager. [phone] Clinic. [phone]

## 2022-03-28 NOTE — Progress Notes (Addendum)
Has not been bradycardic during hospital stay thus far.  No new recommendations from cardiology.  We will sign off.  Please call with any questions.  She is followed at [facility] cardiology and recommend follow-up with them after discharge.

## 2022-03-28 NOTE — TOC Initial Note (Signed)
Transition of Care [facility]) - Initial/Assessment Note    Patient Details  Name: Jill Crane MRN: [id number] Date of Birth: 1941-04-14  Transition of Care [facility]) CM/SW Contact:    Vassie Moselle, LCSW Phone Number: 03/28/2022, 1:55 PM  Clinical Narrative:                 CSW met with to discuss discharge recommendations. Pt states she does not want to go to SNF and prefers to return home with home health. She states she has a lot of family support at home and that her daughter is able to care for her.  CSW spoke with pt's daughter who confirms that family is able to care for this pt and they do not want her to go into SNF at this time. She reports that pt has had home health through [facility] in the past and found their services to be helpful. Pt's daughter is agreeable to having home health set back up for this pt.   Expected Discharge Plan: [place] Barriers to Discharge: Continued Medical Work up   Patient Goals and CMS Choice Patient states their goals for this hospitalization and ongoing recovery are:: To return home   Choice offered to / list presented to : Patient, Adult Children  Expected Discharge Plan and Services Expected Discharge Plan: [place] In-house Referral: NA Discharge Planning Services: NA Post Acute Care Choice: [place] arrangements for the past 2 months: Single Family Home                 DME Arranged: N/A DME Agency: NA                  Prior Living Arrangements/Services Living arrangements for the past 2 months: Single Family Home Lives with:: Adult Children Patient language and need for interpreter reviewed:: Yes Do you feel safe going back to the place where you live?: Yes      Need for Family Participation in Patient Care: No (Comment) Care giver support system in place?: No (comment) Current home services: DME Criminal Activity/Legal Involvement Pertinent to Current  Situation/Hospitalization: No - Comment as needed  Activities of Daily Living Home Assistive Devices/Equipment: Cane (specify quad or straight) ADL Screening (condition at time of admission) Patient's cognitive ability adequate to safely complete daily activities?: Yes Is the patient deaf or have difficulty hearing?: No Does the patient have difficulty seeing, even when wearing glasses/contacts?: No Does the patient have difficulty concentrating, remembering, or making decisions?: No Patient able to express need for assistance with ADLs?: Yes Does the patient have difficulty dressing or bathing?: No Independently performs ADLs?: Yes (appropriate for developmental age) Does the patient have difficulty walking or climbing stairs?: No Weakness of Legs: None Weakness of Arms/Hands: None  Permission Sought/Granted Permission sought to share information with : Case Manager, Family Supports Permission granted to share information with : Yes, Verbal Permission Granted  Share Information with NAME: Jaquelyn Bitter     Permission granted to share info w Relationship: Daughter  Permission granted to share info w Contact Information: [phone]  Emotional Assessment Appearance:: Appears stated age Attitude/Demeanor/Rapport: Engaged Affect (typically observed): Pleasant Orientation: : Oriented to Self, Oriented to Place, Oriented to  Time, Oriented to Situation Alcohol / Substance Use: Not Applicable Psych Involvement: No (comment)  Admission diagnosis:  Cholecystitis, acute [K81.0] Acute on chronic cholecystitis [K81.2] Patient Active Problem List   Diagnosis Date Noted   Acute on chronic cholecystitis  03/23/2022   Hypotension 01/31/2022   Bandemia 01/31/2022   History of pemphigus 01/31/2022   Atrial fibrillation with RVR ([place]) 01/29/2022   Acute cholecystitis 01/28/2022   AKI (acute kidney injury) ([place]) 01/28/2021   GI bleed 01/28/2021   Chronic diastolic CHF (congestive heart  failure) (HCC)    GERD (gastroesophageal reflux disease)    Acute upper GI bleed 01/27/2021   Choledocholithiasis    Current chronic use of systemic steroids 01/21/2021   Cholecystitis 01/20/2021   SIRS (systemic inflammatory response syndrome) ([place]) 08/15/2020   Acute lower UTI 08/15/2020   Diabetes ([place]) 08/15/2020   Essential hypertension 08/15/2020   Sepsis ([place]) 11/10/2019   Atrial fibrillation ([place]) 02/10/2019   PCP:  Burman Freestone, MD Pharmacy:   [place], [place] [address] Phone: [phone] Fax: [place] [address], [place] [address] Phone: [phone] Fax: [phone]  [place]. [place] [address] Phone: [phone] Fax: [phone]     Social Determinants of Health (SDOH) Interventions    Readmission Risk Interventions    03/28/2022    1:53 PM 02/02/2022   10:51 AM  Readmission Risk Prevention Plan  Post Dischage Appt Complete Complete  Medication Screening Complete Complete  Transportation Screening Complete Complete

## 2022-03-28 NOTE — Care Management Important Message (Signed)
Important Message  Patient Details IM Letter given to the Patient. Name: BREI POCIASK MRN: [id number] Date of Birth: Aug 06, 1940   Medicare Important Message Given:  Yes     Caren Macadam 03/28/2022, 10:36 AM

## 2022-03-28 NOTE — Evaluation (Signed)
Physical Therapy Evaluation Patient Details Name: Jill Crane MRN: [id number] DOB: 09-06-1940 Today's Date: 03/28/2022  History of Present Illness  81 y.o. 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 A year ago she had a stone in her bile duct and underwent ERCP for removal of this. She refused a cholecystectomy after that. She then presented this year in July with sepsis secondary to acute cholecystitis.  Pt admitted 03/23/22 for Acute on chronic cholecystitis and pending perc cholecystotomy tube by IR.  Clinical Impression  Pt admitted with above diagnosis.  Pt currently with functional limitations due to the deficits listed below (see PT Problem List). Pt will benefit from skilled PT to increase their independence and safety with mobility to allow discharge to the venue listed below.  Pt agreeable to attempt mobility however limited by bil feet pain today.  Pt reports pain is worse then usual and she typically takes tylenol and gabapentin however pt NPO this morning per RN.  Pt states she lives with her granddaughter and a daughter lives nearby.  Uncertain if pt will need assist upon d/c or assist required once perc chole tube is placed so will recommend SNF at this time.  If family feels able to provide assist, then HHPT.        Recommendations for follow up therapy are one component of a multi-disciplinary discharge planning process, led by the attending physician.  Recommendations may be updated based on patient status, additional functional criteria and insurance authorization.  Follow Up Recommendations Skilled nursing-short term rehab (<3 hours/day) Can patient physically be transported by private vehicle: Yes    Assistance Recommended at Discharge Intermittent Supervision/Assistance  Patient can return home with the following  A little help  with walking and/or transfers;A little help with bathing/dressing/bathroom;Assist for transportation;Help with stairs or ramp for entrance;Assistance with cooking/housework    Equipment Recommendations None recommended by PT  Recommendations for Other Services       Functional Status Assessment Patient has had a recent decline in their functional status and demonstrates the ability to make significant improvements in function in a reasonable and predictable amount of time.     Precautions / Restrictions Precautions Precautions: Fall      Mobility  Bed Mobility Overal bed mobility: Needs Assistance Bed Mobility: Supine to Sit, Sit to Supine     Supine to sit: Supervision, HOB elevated Sit to supine: Supervision, HOB elevated        Transfers Overall transfer level: Needs assistance Equipment used: Rolling walker (2 wheels) Transfers: Sit to/from Stand Sit to Stand: Min assist           General transfer comment: pt took a few steps however reported increased bil feet pain and wished to return to sitting, assist for weakness and stabilizing    Ambulation/Gait                  Stairs            Wheelchair Mobility    Modified Rankin (Stroke Patients Only)       Balance Overall balance assessment: Needs assistance         Standing balance support: Bilateral upper extremity supported, Reliant on assistive device for balance Standing balance-Leahy Scale: Poor  Pertinent Vitals/Pain Pain Assessment Pain Assessment: Faces Faces Pain Scale: Hurts little more Pain Location: bil feet Pain Descriptors / Indicators: Sore, Pins and needles Pain Intervention(s): Repositioned, Monitored during session, Patient requesting pain meds-RN notified    Home Living Family/patient expects to be discharged to:: Private residence Living Arrangements: Other relatives (granddaughter) Available Help at Discharge:  Family;Available PRN/intermittently Type of Home: House Home Access: Level entry       Home Layout: One level Home Equipment: Agricultural consultant (2 wheels)      Prior Function Prior Level of Function : Independent/Modified Independent                     Hand Dominance        Extremity/Trunk Assessment        Lower Extremity Assessment Lower Extremity Assessment: Generalized weakness;RLE deficits/detail;LLE deficits/detail RLE Deficits / Details: report "nerve pain" in bil feet worse then usual, states she takes tylenol and gabapentin at home (both on hold for pending surgery today 9/12 per RN)       Communication   Communication: Expressive difficulties (difficult to understand at times, garbled speech)  Cognition Arousal/Alertness: Awake/alert Behavior During Therapy: WFL for tasks assessed/performed Overall Cognitive Status: Within Functional Limits for tasks assessed                                          General Comments      Exercises     Assessment/Plan    PT Assessment Patient needs continued PT services  PT Problem List Decreased strength;Decreased activity tolerance;Decreased balance;Decreased mobility;Impaired sensation;Decreased knowledge of use of DME       PT Treatment Interventions DME instruction;Gait training;Balance training;Therapeutic exercise;Functional mobility training;Therapeutic activities;Patient/family education;Stair training    PT Goals (Current goals can be found in the Care Plan section)  Acute Rehab PT Goals PT Goal Formulation: With patient Time For Goal Achievement: 04/11/22 Potential to Achieve Goals: Good    Frequency Min 3X/week     Co-evaluation               AM-PAC PT "6 Clicks" Mobility  Outcome Measure Help needed turning from your back to your side while in a flat bed without using bedrails?: A Little Help needed moving from lying on your back to sitting on the side of a flat bed  without using bedrails?: A Little Help needed moving to and from a bed to a chair (including a wheelchair)?: A Lot Help needed standing up from a chair using your arms (e.g., wheelchair or bedside chair)?: A Lot Help needed to walk in hospital room?: A Lot Help needed climbing 3-5 steps with a railing? : Total 6 Click Score: 13    End of Session   Activity Tolerance: Patient tolerated treatment well Patient left: in bed;with call bell/phone within reach;with bed alarm set Nurse Communication: Mobility status PT Visit Diagnosis: Difficulty in walking, not elsewhere classified (R26.2)    Time: 1601-0932 PT Time Calculation (min) (ACUTE ONLY): 11 min   Charges:   PT Evaluation $PT Eval Low Complexity: 1 Low        Kati PT, DPT Physical Therapist Acute Rehabilitation Services Preferred contact method: Secure Chat Weekend Pager Only: [phone] Office: [phone]   Jill Crane 03/28/2022, 12:54 PM

## 2022-03-29 ENCOUNTER — Inpatient Hospital Stay (HOSPITAL_BASED_OUTPATIENT_CLINIC_OR_DEPARTMENT_OTHER): Payer: Medicare Other

## 2022-03-29 DIAGNOSIS — I4811 Longstanding persistent atrial fibrillation: Secondary | ICD-10-CM | POA: Diagnosis not present

## 2022-03-29 DIAGNOSIS — I70222 Atherosclerosis of native arteries of extremities with rest pain, left leg: Secondary | ICD-10-CM

## 2022-03-29 DIAGNOSIS — E1165 Type 2 diabetes mellitus with hyperglycemia: Secondary | ICD-10-CM | POA: Diagnosis not present

## 2022-03-29 DIAGNOSIS — K812 Acute cholecystitis with chronic cholecystitis: Secondary | ICD-10-CM | POA: Diagnosis not present

## 2022-03-29 DIAGNOSIS — I5032 Chronic diastolic (congestive) heart failure: Secondary | ICD-10-CM | POA: Diagnosis not present

## 2022-03-29 LAB — GLUCOSE, CAPILLARY
Glucose-Capillary: 196 mg/dL — ABNORMAL HIGH (ref 70–99)
Glucose-Capillary: 129 mg/dL — ABNORMAL HIGH (ref 70–99)
Glucose-Capillary: 166 mg/dL — ABNORMAL HIGH (ref 70–99)
Glucose-Capillary: 191 mg/dL — ABNORMAL HIGH (ref 70–99)

## 2022-03-29 LAB — PROTIME-INR
INR: 1.4 — ABNORMAL HIGH (ref 0.8–1.2)
Prothrombin Time: 16.5 seconds — ABNORMAL HIGH (ref 11.4–15.2)

## 2022-03-29 MED ORDER — WARFARIN SODIUM 5 MG PO TABS
5.0000 mg | ORAL_TABLET | Freq: Once | ORAL | Status: AC
  Administered 2022-03-29: 5 mg via ORAL
  Filled 2022-03-29: qty 1

## 2022-03-29 MED ORDER — ENOXAPARIN SODIUM 80 MG/0.8ML IJ SOSY
70.0000 mg | PREFILLED_SYRINGE | Freq: Once | INTRAMUSCULAR | Status: AC
  Administered 2022-03-29: 70 mg via SUBCUTANEOUS
  Filled 2022-03-29: qty 0.8

## 2022-03-29 MED ORDER — SODIUM CHLORIDE 0.9% FLUSH
5.0000 mL | Freq: Three times a day (TID) | INTRAVENOUS | Status: DC
  Administered 2022-03-29 – 2022-03-30 (×3): 5 mL

## 2022-03-29 MED ORDER — TRAMADOL HCL 50 MG PO TABS
25.0000 mg | ORAL_TABLET | Freq: Once | ORAL | Status: AC
  Administered 2022-03-29: 25 mg via ORAL
  Filled 2022-03-29: qty 1

## 2022-03-29 MED ORDER — WARFARIN - PHARMACIST DOSING INPATIENT: Freq: Every day | Status: DC

## 2022-03-29 MED ORDER — ENOXAPARIN SODIUM 80 MG/0.8ML IJ SOSY
70.0000 mg | PREFILLED_SYRINGE | Freq: Two times a day (BID) | INTRAMUSCULAR | Status: DC
  Administered 2022-03-29 – 2022-03-30 (×2): 70 mg via SUBCUTANEOUS
  Filled 2022-03-29 (×2): qty 0.8

## 2022-03-29 NOTE — Progress Notes (Signed)
Referring Physician(s): White,C  Supervising Physician: Mir, Biochemist, clinical  Patient Status:  [facility] - In-pt  Chief Complaint: Abdominal/back pain, acute on chronic cholecystitis  Subjective: Pt still having some abd/back discomfort as well as foot pain; denies N/V   Allergies: Patient has no known allergies.  Medications: Prior to Admission medications   Medication Sig Start Date End Date Taking? Authorizing Provider  acetaminophen (TYLENOL) 500 MG tablet Take 500 mg by mouth every 6 (six) hours as needed for mild pain.   Yes [provider]  albuterol (VENTOLIN HFA) 108 (90 Base) MCG/ACT inhaler Inhale 2 puffs into the lungs every 6 (six) hours as needed for wheezing or shortness of breath. 02/02/22  Yes Mercy Riding, MD  alendronate (FOSAMAX) 35 MG tablet Take 35 mg by mouth every Monday. Take with a full glass of water on an empty stomach.   Yes [provider]  bimatoprost (LUMIGAN) 0.03 % ophthalmic solution Place 1 drop into both eyes at bedtime.   Yes [provider]  brimonidine (ALPHAGAN) 0.2 % ophthalmic solution Place 1 drop into the left eye in the morning and at bedtime. 10/21/19  Yes [provider]  busPIRone (BUSPAR) 10 MG tablet Take 10 mg by mouth 2 (two) times daily. 08/04/20  Yes [provider]  calcium-vitamin D (OSCAL WITH D) 500-200 MG-UNIT per tablet Take 1 tablet by mouth.   Yes [provider]  dorzolamide (TRUSOPT) 2 % ophthalmic solution Place 1 drop into the left eye 2 (two) times daily. 08/03/20  Yes [provider]  fluocinonide ointment (LIDEX) [id number] % Apply 1 Application topically 2 (two) times daily as needed (scalp irritation). 12/27/21  Yes [provider]  fluticasone (FLONASE) 50 MCG/ACT nasal spray Place 2 sprays into both nostrils daily as needed for allergies. 09/27/21  Yes [provider]  furosemide (LASIX) 20 MG tablet Take 1 tablet (20 mg total) by mouth daily. 01/28/21   Yes Annita Brod, MD  gabapentin (NEURONTIN) 300 MG capsule Take 300 mg by mouth at bedtime. 08/10/20  Yes [provider]  magnesium oxide (MAG-OX) 400 (241.3 Mg) MG tablet Take 400 mg by mouth in the morning and at bedtime. 07/08/20  Yes [provider]  metFORMIN (GLUCOPHAGE) 500 MG tablet Take 500 mg by mouth 2 (two) times daily. 08/24/20  Yes [provider]  metoprolol succinate (TOPROL-XL) 25 MG 24 hr tablet Take 12.5 mg by mouth daily.   Yes [provider]  omeprazole (PRILOSEC) 20 MG capsule Take 20 mg by mouth daily.   Yes [provider]  predniSONE (DELTASONE) 2.5 MG tablet Take 7.5 mg by mouth daily.   Yes [provider]  silver sulfADIAZINE (SILVADENE) 1 % cream Apply 1 Application topically daily as needed (scalp irritation).   Yes [provider]  warfarin (COUMADIN) 2 MG tablet On Saturday and Sunday, take 2 tabs (4mg ), then resume normal regimen Take 1 tablet (2 mg) daily except Take 2 tablets (4 mg) on (Tues & Fri) Patient taking differently: Take 2-4 mg by mouth as directed. Take 2 tablets (4 mg) on (Sun, Mon, Wed, Fri) & Take 1 tablet (2 mg) on (Tues, [place], Sat) 01/28/21  Yes Annita Brod, MD  sodium chloride flush (NS) 0.9 % SOLN Inject 5 mLs by Intracatheter route daily. 02/01/22   Han, Aimee H, PA-C     Vital Signs: BP 111/87 (BP Location: Left Arm)   Pulse 97   Temp 98.8 F (37.1  C) (Oral)   Resp 18   Ht 5\' 2"  (1.575 m)   Wt 150 lb (68 kg)   SpO2 98%   BMI 27.44 kg/m   Physical Exam awake, answering questions ok; GB drain intact, insertion site ok, mild- mod tender, drain flushed without difficulty, OP 25 cc golden yellow bile  Imaging: IR Perc Cholecystostomy  Result Date: 03/28/2022 INDICATION: Acute cholecystitis. Interval removal of prior cholecystostomy tube placed 01/30/2022. EXAM: ULTRASOUND AND FLUOROSCOPIC-GUIDED CHOLECYSTOSTOMY TUBE PLACEMENT COMPARISON:  CT AP, 03/23/2022.  MEDICATIONS: The patient is currently admitted to the hospital and on intravenous antibiotics. Antibiotics were administered within an appropriate time frame prior to skin puncture. ANESTHESIA/SEDATION: Moderate (conscious) sedation was employed during this procedure. A total of Versed 0.5 mg and Fentanyl 50 mcg was administered intravenously. Moderate Sedation Time: 20 minutes. The patient's level of consciousness and vital signs were monitored continuously by radiology nursing throughout the procedure under my direct supervision. CONTRAST:  32mL OMNIPAQUE IOHEXOL 300 MG/ML SOLN - administered into the gallbladder fossa. FLUOROSCOPY TIME:  Fluoroscopic dose; 8 mGy COMPLICATIONS: None immediate. PROCEDURE: Informed written consent was obtained from the the patient and/or patient's representative after a discussion of the risks, benefits and alternatives to treatment. Questions regarding the procedure were encouraged and answered. A timeout was performed prior to the initiation of the procedure. The RIGHT upper abdominal quadrant was prepped and draped in the usual sterile fashion, and a sterile drape was applied covering the operative field. Maximum barrier sterile technique with sterile gowns and gloves were used for the procedure. A timeout was performed prior to the initiation of the procedure. Local anesthesia was provided with 1% lidocaine with epinephrine. Ultrasound scanning of the right upper quadrant demonstrates a markedly dilated gallbladder. Utilizing a transhepatic approach, a 22 gauge needle was advanced into the gallbladder under direct ultrasound guidance. An ultrasound image was saved for documentation purposes. Appropriate intraluminal puncture was confirmed with the efflux of bile and advancement of an 0.018 wire into the gallbladder lumen. The needle was exchanged for an Accustick set. A small amount of contrast was injected to confirm appropriate intraluminal positioning. Over a short Amplatz  wire, a 10 Fr cholecystomy tube was advanced into the gallbladder fossa, coiled and locked. Bile was aspirated and a small amount of contrast was injected as several post procedural spot radiographic images were obtained in various obliquities. The catheter was secured to the skin with suture, connected to a drainage bag and a dressing was placed. The patient tolerated the procedure well without immediate post procedural complication. IMPRESSION: Successful placement of a 10 Fr cholecystostomy drainage tube via transhepatic approach, as above. PLAN: * The patient will return to Vascular Interventional Radiology (VIR) for routine cholecystostomy drainage catheter evaluation and exchange in 8 weeks. 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 IVC filters can cause complications when left in place for extended periods of time. Patient will be referred to vascular interventional radiology clinic for evaluation for removal. 06/27/2014, MD [facility] Electronically Signed   By: [facility] M.D.   On: 03/28/2022 20:38    Labs:  CBC: Recent Labs    03/24/22 0556 03/25/22 0724 03/26/22 0542 03/27/22 0456  WBC 17.4* 13.9* 8.6 8.3  HGB 12.9 12.3 12.4 12.6  HCT 40.1 39.2 39.5 40.2  PLT 176 194 209 236    COAGS: Recent Labs    03/26/22 0542 03/27/22 0943 03/28/22 0539 03/29/22 0512  INR  1.8* 1.8* 1.5* 1.4*    BMP: Recent Labs    03/24/22 0556 03/25/22 0724 03/26/22 0542 03/27/22 0456  NA 136 139 140 139  K 4.3 4.2 4.3 4.0  CL 110 110 109 106  CO2 21* 23 22 25   GLUCOSE 92 82 86 91  BUN 15 15 13 16   CALCIUM 8.2* 9.1 9.4 9.5  CREATININE 0.99 1.10* 0.98 0.99  GFRNONAA 58* 51* 58* 58*    LIVER FUNCTION TESTS: Recent Labs    01/29/22 0500 01/30/22 0313 02/01/22 0243 02/10/22 1311 03/23/22 1401  BILITOT 2.1* 2.2*  --  0.4 1.7*  AST 16 15  --  20 20  ALT 16 14  --  16 10   ALKPHOS 79 71  --  54 69  PROT 7.7 7.6  --  6.6 6.7  ALBUMIN 3.4* 3.3* 2.7* 2.9* 3.0*    Assessment and Plan: Acute on chronic cholecystitis; s/p perc GB drain initially placed 01/30/22, dislodged 03/11/22; replaced 03/28/22; afebrile, bile cx neg to date   Drain Location: RUQ Size: Fr size: 10 Fr Date of placement: 03/28/22  Currently to: Drain collection device: gravity 24 hour output:  Output by Drain (mL) 03/27/22 0701 - 03/27/22 1900 03/27/22 1901 - 03/28/22 0700 03/28/22 0701 - 03/28/22 1900 03/28/22 1901 - 03/29/22 0700 03/29/22 0701 - 03/29/22 1050  Biliary Tube 10.2 Fr. RUQ    20       Current examination: Flushes/aspirates easily.  Insertion site unremarkable. Suture in place. Dressed appropriately.   Plan: Continue TID flushes with 5 cc NS. Record output Q shift. Dressing changes QD or PRN if soiled.  Call IR APP or on call IR MD if difficulty flushing or sudden change in drain output.    Discharge planning: Please contact IR APP or on call IR MD prior to patient d/c to ensure appropriate follow up plans are in place. Patient will need to flush drain QD with 5 cc NS, record output QD, dressing changes every 2-3 days or earlier if soiled.   IR will continue to follow - please call with questions or concerns.  Follow up GB drain exchange in 6-8 weeks has been ordered ; if pt's clinical status worsens recommend obtaining f/u CT  Other plans as per TRH/CCS    Electronically Signed: D. 03/31/22, PA-C 03/29/2022, 10:45 AM   I spent a total of 15 Minutes at the the patient's bedside AND on the patient's hospital floor or unit, greater than 50% of which was counseling/coordinating care for gallbladder drain    Patient ID: Jill Crane, female   DOB: 05-02-1941, 81 y.o.   MRN: [id number]

## 2022-03-29 NOTE — Progress Notes (Signed)
ABI has been completed.   Results can be found under chart review under CV PROC. 03/29/2022 4:21 PM Keirra Zeimet RVT, RDMS

## 2022-03-29 NOTE — Progress Notes (Signed)
PROGRESS NOTE    Jill Crane  EHM:[id number] DOB: Oct 02, 1940 DOA: 03/23/2022 PCP: Zoila Shutter, MD    Chief Complaint  Patient presents with   Abdominal Pain    Brief Narrative:  Jill Crane is a 81 y.o. 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 A year ago she had a stone in her bile duct and underwent ERCP for removal of this. She refused a cholecystectomy after that. She then presented this year in July with sepsis secondary to acute cholecystitis.  she underwent percutaneous cholecystostomy tube placemen on 01/30/2022. She was due to have a cholangiogram but the drain inadvertently fell out 03/13/2022.She begin to have symptoms of abdominal discomfort and had an episode of vomiting 2 days ago.  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  No loculated fluid collection.  General surgery, IR and cardiology consulted and on board. Patient seen and examined at bedside.  Her INR is 1.5 on 03/28/22 and she underwent perc drain in the Gall bladder.   Assessment & Plan:   Principal Problem:   Acute on chronic cholecystitis Active Problems:   Atrial fibrillation (HCC)   Diabetes (HCC)   AKI (acute kidney injury) (HCC)   Chronic diastolic CHF (congestive heart failure) (HCC)   Hypotension   History of pemphigus   Acute on chronic cholecystitis:  - CT abd and pelvis reveal recurrence of cholecystitis.  - started on IV zosyn, and pain control.  - gen surgery and IR on board.  - INR is 1.5, she underwent perc drain in the Gall bladder.  - she remains afebrile, no leukocytosis. - her symptoms of nausea, vomiting and abdominal pain have resolved.  - restarted her diet and plan for discharge in the next 24 hours if no symptoms.   H/o Pemphigus:  Continue with prednisone.     Hypotension; resolved.  BP parameters are optimal.   H/o chronic diastolic Chf She appears to be compensated.  Continue with lasix 20 mg daiy.    AKI;  Creatinine back to baseline    Atrial fibrillation  On coumadin for anti coagulation, which is on hold.  Continue to monitor.  Rate controlled with metoprolol 12.5 mg daily.  INR is 1.5 today.    Type 2 DM cbgs are better controlled.  CBG (last 3)  Recent Labs    03/28/22 2150 03/29/22 0901 03/29/22 1206  GLUCAP 162* 196* 129*    Continue with SSI.  Last A1c is 6.8%. no change in meds. O metformin at home.   Diabetic neuropathy:  She is on gabapentin 300 mg prn at bedtime. Will increase the dose to 300 mg BID .   COPD:  No wheezing heard. Pt on RA with good sats.    Staph epidermidis bacteremia:  In 1 out of 4 bottles.  On IV zosyn already.   Bilateral lower extremity pain / bilateral foot pain.  On gabapentin.  ABI'S Ordered.     DVT prophylaxis: scd's Code Status: full code.  Family Communication: none at bedside.  Disposition:   Status is: Inpatient Remains inpatient appropriate because: will need IR perc drain on Monday.    Level of care: Telemetry Consultants:  General surgery IR.  Cardiology.   Procedures: CT abd and pelvis.   Antimicrobials:  Antibiotics Given (last 72 hours)     Date/Time Action Medication Dose Rate  03/26/22 2023 New Bag/Given   piperacillin-tazobactam (ZOSYN) IVPB 3.375 g 3.375 g 12.5 mL/hr   03/27/22 0338 New Bag/Given   piperacillin-tazobactam (ZOSYN) IVPB 3.375 g 3.375 g 12.5 mL/hr   03/27/22 1133 New Bag/Given   piperacillin-tazobactam (ZOSYN) IVPB 3.375 g 3.375 g 12.5 mL/hr   03/27/22 1958 New Bag/Given   piperacillin-tazobactam (ZOSYN) IVPB 3.375 g 3.375 g 12.5 mL/hr   03/28/22 0320 New Bag/Given   piperacillin-tazobactam (ZOSYN) IVPB 3.375 g 3.375 g 12.5 mL/hr   03/28/22 1249 New Bag/Given   piperacillin-tazobactam (ZOSYN) IVPB 3.375 g 3.375 g 12.5  mL/hr   03/28/22 2020 New Bag/Given   piperacillin-tazobactam (ZOSYN) IVPB 3.375 g 3.375 g 12.5 mL/hr   03/29/22 0425 New Bag/Given   piperacillin-tazobactam (ZOSYN) IVPB 3.375 g 3.375 g 12.5 mL/hr   03/29/22 1206 New Bag/Given   piperacillin-tazobactam (ZOSYN) IVPB 3.375 g 3.375 g 12.5 mL/hr          Subjective: Persistent leg pain.   Objective: Vitals:   03/28/22 1736 03/28/22 2002 03/29/22 0504 03/29/22 0838  BP: 129/77 119/69 123/66 111/87  Pulse: 100 94 93 97  Resp: 16 18 20 18   Temp: 97.9 F (36.6 C) 98.6 F (37 C) 100 F (37.8 C) 98.8 F (37.1 C)  TempSrc: Oral Oral Oral Oral  SpO2: 100% 96% 98% 98%  Weight:      Height:        Intake/Output Summary (Last 24 hours) at 03/29/2022 1324 Last data filed at 03/29/2022 1254 Gross per 24 hour  Intake 315.41 ml  Output 570 ml  Net -254.59 ml    Filed Weights   03/23/22 1339  Weight: 68 kg    Examination:  General exam: Appears calm and comfortable  Respiratory system: Clear to auscultation. Respiratory effort normal. Cardiovascular system: S1 & S2 heard, RRR. No JVD, . No pedal edema. Gastrointestinal system: Abdomen is nondistended, soft and nontender. Central nervous system: Alert and oriented. No focal neurological deficits. Extremities: foot pain, bilateral.  Skin: No rashes, lesions or ulcers Psychiatry:  Mood & affect appropriate.         Data Reviewed: I have personally reviewed following labs and imaging studies  CBC: Recent Labs  Lab 03/23/22 1401 03/24/22 0556 03/25/22 0724 03/26/22 0542 03/27/22 0456  WBC 14.7* 17.4* 13.9* 8.6 8.3  NEUTROABS  --   --  10.8* 6.0 5.3  HGB 12.9 12.9 12.3 12.4 12.6  HCT 39.8 40.1 39.2 39.5 40.2  MCV 83.8 86.6 85.4 86.1 86.3  PLT 192 176 194 209 236     Basic Metabolic Panel: Recent Labs  Lab 03/23/22 1401 03/24/22 0556 03/25/22 0724 03/26/22 0542 03/27/22 0456  NA 134* 136 139 140 139  K 4.1 4.3 4.2 4.3 4.0  CL 107 110 110 109 106  CO2  19* 21* 23 22 25   GLUCOSE 189* 92 82 86 91  BUN 16 15 15 13 16   CREATININE 1.10* 0.99 1.10* 0.98 0.99  CALCIUM 8.4* 8.2* 9.1 9.4 9.5     GFR: Estimated Creatinine Clearance: 41 mL/min (by C-G formula based on SCr of 0.99 mg/dL).  Liver Function Tests: Recent Labs  Lab 03/23/22 1401  AST 20  ALT 10  ALKPHOS 69  BILITOT 1.7*  PROT 6.7  ALBUMIN 3.0*     CBG: Recent Labs  Lab 03/28/22 1204 03/28/22 1803 03/28/22 2150 03/29/22 0901 03/29/22 1206  GLUCAP 85 91 162* 196* 129*      Recent Results (from the past 240 hour(s))  Culture, blood (  routine x 2)     Status: None   Collection Time: 03/23/22  4:31 PM   Specimen: BLOOD RIGHT FOREARM  Result Value Ref Range Status   Specimen Description   Final    BLOOD RIGHT FOREARM Performed at [facility] [place], [address] [place], [place] [address]    Special Requests   Final    BOTTLES DRAWN AEROBIC AND ANAEROBIC Blood Culture adequate volume Performed at [facility] [place], [address] [place], [place] [address]    Culture   Final    NO GROWTH 5 DAYS Performed at [facility], [address] [place], [place] [address]    Report Status 03/28/2022 FINAL  Final  Culture, blood (routine x 2)     Status: Abnormal   Collection Time: 03/23/22  6:49 PM   Specimen: BLOOD LEFT HAND  Result Value Ref Range Status   Specimen Description   Final    BLOOD LEFT HAND Performed at [facility], [address] [place], [place] [address]    Special Requests   Final    BOTTLES DRAWN AEROBIC ONLY Blood Culture results may not be optimal due to an inadequate volume of blood received in culture bottles Performed at [facility], [address] [place], [place] [address]    Culture  Setup Time   Final    GRAM POSITIVE COCCI IN CLUSTERS AEROBIC BOTTLE ONLY CRITICAL RESULT CALLED TO, READ BACK BY AND VERIFIED WITH: PHARMD J. LEGGE [phone] @1537  fh    Culture (A)  Final     STAPHYLOCOCCUS EPIDERMIDIS THE SIGNIFICANCE OF ISOLATING THIS ORGANISM FROM A SINGLE VENIPUNCTURE CANNOT BE PREDICTED WITHOUT FURTHER CLINICAL AND CULTURE CORRELATION. SUSCEPTIBILITIES AVAILABLE ONLY ON REQUEST. Performed at [facility], [address] [place], [place] [address]    Report Status 03/25/2022 FINAL  Final  Blood Culture ID Panel (Reflexed)     Status: Abnormal   Collection Time: 03/23/22  6:49 PM  Result Value Ref Range Status   Enterococcus faecalis NOT DETECTED NOT DETECTED Final   Enterococcus Faecium NOT DETECTED NOT DETECTED Final   Listeria monocytogenes NOT DETECTED NOT DETECTED Final   Staphylococcus species DETECTED (A) NOT DETECTED Final    Comment: CRITICAL RESULT CALLED TO, READ BACK BY AND VERIFIED WITH: PHARMD J. LEGGE [phone] @1537  fh    Staphylococcus aureus (BCID) NOT DETECTED NOT DETECTED Final   Staphylococcus epidermidis DETECTED (A) NOT DETECTED Final    Comment: CRITICAL RESULT CALLED TO, READ BACK BY AND VERIFIED WITH: PHARMD J. LEGGE [id number] @1537  fh    Staphylococcus lugdunensis NOT DETECTED NOT DETECTED Final   Streptococcus species NOT DETECTED NOT DETECTED Final   Streptococcus agalactiae NOT DETECTED NOT DETECTED Final   Streptococcus pneumoniae NOT DETECTED NOT DETECTED Final   Streptococcus pyogenes NOT DETECTED NOT DETECTED Final   A.calcoaceticus-baumannii NOT DETECTED NOT DETECTED Final   Bacteroides fragilis NOT DETECTED NOT DETECTED Final   Enterobacterales NOT DETECTED NOT DETECTED Final   Enterobacter cloacae complex NOT DETECTED NOT DETECTED Final   Escherichia coli NOT DETECTED NOT DETECTED Final   Klebsiella aerogenes NOT DETECTED NOT DETECTED Final   Klebsiella oxytoca NOT DETECTED NOT DETECTED Final   Klebsiella pneumoniae NOT DETECTED NOT DETECTED Final   Proteus species NOT DETECTED NOT DETECTED Final   Salmonella species NOT DETECTED NOT DETECTED Final   Serratia marcescens NOT DETECTED NOT DETECTED Final    Haemophilus influenzae NOT DETECTED NOT  DETECTED Final   Neisseria meningitidis NOT DETECTED NOT DETECTED Final   Pseudomonas aeruginosa NOT DETECTED NOT DETECTED Final   Stenotrophomonas maltophilia NOT DETECTED NOT DETECTED Final   Candida albicans NOT DETECTED NOT DETECTED Final   Candida auris NOT DETECTED NOT DETECTED Final   Candida glabrata NOT DETECTED NOT DETECTED Final   Candida krusei NOT DETECTED NOT DETECTED Final   Candida parapsilosis NOT DETECTED NOT DETECTED Final   Candida tropicalis NOT DETECTED NOT DETECTED Final   Cryptococcus neoformans/gattii NOT DETECTED NOT DETECTED Final   Methicillin resistance mecA/C NOT DETECTED NOT DETECTED Final    Comment: Performed at [facility], [address], [place], [place] [address]  Aerobic/Anaerobic Culture w Gram Stain (surgical/deep wound)     Status: None (Preliminary result)   Collection Time: 03/28/22  5:04 PM   Specimen: PATH Gallbladder; Tissue  Result Value Ref Range Status   Specimen Description   Final    TISSUE Performed at [facility], [address] [place], [place] [address]    Special Requests   Final    NONE GALLBLADDER Performed at [facility], [address] [place], [place] [address]    Gram Stain RARE WBC PRESENT, PREDOMINANTLY PMN RARE YEAST   Final   Culture   Final    NO GROWTH < 12 HOURS Performed at [facility], [address], [place], [place] [address]    Report Status PENDING  Incomplete         Radiology Studies: IR Perc Cholecystostomy  Result Date: 03/28/2022 INDICATION: Acute cholecystitis. Interval removal of prior cholecystostomy tube placed 01/30/2022. EXAM: ULTRASOUND AND FLUOROSCOPIC-GUIDED CHOLECYSTOSTOMY TUBE PLACEMENT COMPARISON:  CT AP, 03/23/2022. MEDICATIONS: The patient is currently admitted to the hospital and on intravenous antibiotics. Antibiotics were administered within an appropriate time frame prior to skin  puncture. ANESTHESIA/SEDATION: Moderate (conscious) sedation was employed during this procedure. A total of Versed 0.5 mg and Fentanyl 50 mcg was administered intravenously. Moderate Sedation Time: 20 minutes. The patient's level of consciousness and vital signs were monitored continuously by radiology nursing throughout the procedure under my direct supervision. CONTRAST:  31mL OMNIPAQUE IOHEXOL 300 MG/ML SOLN - administered into the gallbladder fossa. FLUOROSCOPY TIME:  Fluoroscopic dose; 8 mGy COMPLICATIONS: None immediate. PROCEDURE: Informed written consent was obtained from the the patient and/or patient's representative after a discussion of the risks, benefits and alternatives to treatment. Questions regarding the procedure were encouraged and answered. A timeout was performed prior to the initiation of the procedure. The RIGHT upper abdominal quadrant was prepped and draped in the usual sterile fashion, and a sterile drape was applied covering the operative field. Maximum barrier sterile technique with sterile gowns and gloves were used for the procedure. A timeout was performed prior to the initiation of the procedure. Local anesthesia was provided with 1% lidocaine with epinephrine. Ultrasound scanning of the right upper quadrant demonstrates a markedly dilated gallbladder. Utilizing a transhepatic approach, a 22 gauge needle was advanced into the gallbladder under direct ultrasound guidance. An ultrasound image was saved for documentation purposes. Appropriate intraluminal puncture was confirmed with the efflux of bile and advancement of an 0.018 wire into the gallbladder lumen. The needle was exchanged for an Accustick set. A small amount of contrast was injected to confirm appropriate intraluminal positioning. Over a short Amplatz wire, a 10 Fr cholecystomy tube was advanced into the gallbladder fossa, coiled and locked. Bile was aspirated and a small amount of contrast was  injected as several post  procedural spot radiographic images were obtained in various obliquities. The catheter was secured to the skin with suture, connected to a drainage bag and a dressing was placed. The patient tolerated the procedure well without immediate post procedural complication. IMPRESSION: Successful placement of a 10 Fr cholecystostomy drainage tube via transhepatic approach, as above. PLAN: * The patient will return to Vascular Interventional Radiology (VIR) for routine cholecystostomy drainage catheter evaluation and exchange in 8 weeks. 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 IVC filters can cause complications when left in place for extended periods of time. Patient will be referred to vascular interventional radiology clinic for evaluation for removal. Roanna Banning, MD [facility] Radiology Electronically Signed   By: Roanna Banning M.D.   On: 03/28/2022 20:38        Scheduled Meds:  brimonidine  1 drop Left Eye BID   busPIRone  10 mg Oral BID   calcium-vitamin D  1 tablet Oral BID   dorzolamide  1 drop Left Eye BID   enoxaparin (LOVENOX) injection  70 mg Subcutaneous Q12H   furosemide  20 mg Oral Daily   gabapentin  300 mg Oral BID   insulin aspart  0-9 Units Subcutaneous TID WC   latanoprost  1 drop Both Eyes QHS   lidocaine  1 patch Transdermal Q24H   magnesium oxide  400 mg Oral BID   metoprolol succinate  12.5 mg Oral Daily   pantoprazole  40 mg Oral Daily   predniSONE  7.5 mg Oral Daily   sodium chloride flush  5 mL Intracatheter Q8H   warfarin  5 mg Oral ONCE-1600   Warfarin - Pharmacist Dosing Inpatient   Does not apply q1600   Continuous Infusions:  piperacillin-tazobactam (ZOSYN)  IV 3.375 g (03/29/22 1206)     LOS: 6 days        Kathlen Mody, MD [facility]   To contact the attending provider between 7A-7P or the covering provider during after hours 7P-7A, please log into  the web site [email or web address] and access using universal [place] password for that web site. If you do not have the password, please call the hospital operator.  03/29/2022, 1:24 PM

## 2022-03-29 NOTE — Progress Notes (Signed)
Subjective/Chief Complaint: Cc - back pain  Denies abdominal pain, just mild soreness around drain. denies nausea or vomiting. States she tolerated PO yesterday after the procedure. Denies BM.  Objective: Vital signs in last 24 hours: Temp:  [97.9 F (36.6 C)-100 F (37.8 C)] 98.8 F (37.1 C) (09/13 0838) Pulse Rate:  [51-112] 97 (09/13 0838) Resp:  [11-31] 18 (09/13 0838) BP: (83-154)/(61-98) 111/87 (09/13 0838) SpO2:  [88 %-100 %] 98 % (09/13 0838) Last BM Date : 03/28/22  Intake/Output from previous day: 09/12 0701 - 09/13 0700 In: 305.4 [P.O.:120; IV Piggyback:180.4] Out: 570 [Urine:550; Drains:20] Intake/Output this shift: No intake/output data recorded.   General appearance: alert, cooperative, and no distress Resp: breathing normally GI: soft, non distended, appropriately tender around per chole tube which is draining bilious/SS effluent   Lab Results:  Recent Labs    03/27/22 0456  WBC 8.3  HGB 12.6  HCT 40.2  PLT 236   BMET Recent Labs    03/27/22 0456  NA 139  K 4.0  CL 106  CO2 25  GLUCOSE 91  BUN 16  CREATININE 0.99  CALCIUM 9.5   PT/INR Recent Labs    03/28/22 0539 03/29/22 0512  LABPROT 17.5* 16.5*  INR 1.5* 1.4*   ABG No results for input(s): "PHART", "HCO3" in the last 72 hours.  Invalid input(s): "PCO2", "PO2"  Studies/Results: IR Perc Cholecystostomy  Result Date: 03/28/2022 INDICATION: Acute cholecystitis. Interval removal of prior cholecystostomy tube placed 01/30/2022. EXAM: ULTRASOUND AND FLUOROSCOPIC-GUIDED CHOLECYSTOSTOMY TUBE PLACEMENT COMPARISON:  CT AP, 03/23/2022. MEDICATIONS: The patient is currently admitted to the hospital and on intravenous antibiotics. Antibiotics were administered within an appropriate time frame prior to skin puncture. ANESTHESIA/SEDATION: Moderate (conscious) sedation was employed during this procedure. A total of Versed 0.5 mg and Fentanyl 50 mcg was administered intravenously. Moderate  Sedation Time: 20 minutes. The patient's level of consciousness and vital signs were monitored continuously by radiology nursing throughout the procedure under my direct supervision. CONTRAST:  59mL OMNIPAQUE IOHEXOL 300 MG/ML SOLN - administered into the gallbladder fossa. FLUOROSCOPY TIME:  Fluoroscopic dose; 8 mGy COMPLICATIONS: None immediate. PROCEDURE: Informed written consent was obtained from the the patient and/or patient's representative after a discussion of the risks, benefits and alternatives to treatment. Questions regarding the procedure were encouraged and answered. A timeout was performed prior to the initiation of the procedure. The RIGHT upper abdominal quadrant was prepped and draped in the usual sterile fashion, and a sterile drape was applied covering the operative field. Maximum barrier sterile technique with sterile gowns and gloves were used for the procedure. A timeout was performed prior to the initiation of the procedure. Local anesthesia was provided with 1% lidocaine with epinephrine. Ultrasound scanning of the right upper quadrant demonstrates a markedly dilated gallbladder. Utilizing a transhepatic approach, a 22 gauge needle was advanced into the gallbladder under direct ultrasound guidance. An ultrasound image was saved for documentation purposes. Appropriate intraluminal puncture was confirmed with the efflux of bile and advancement of an 0.018 wire into the gallbladder lumen. The needle was exchanged for an Accustick set. A small amount of contrast was injected to confirm appropriate intraluminal positioning. Over a short Amplatz wire, a 10 Fr cholecystomy tube was advanced into the gallbladder fossa, coiled and locked. Bile was aspirated and a small amount of contrast was injected as several post procedural spot radiographic images were obtained in various obliquities. The catheter was secured to the skin with suture, connected to a drainage bag and  a dressing was placed. The  patient tolerated the procedure well without immediate post procedural complication. IMPRESSION: Successful placement of a 10 Fr cholecystostomy drainage tube via transhepatic approach, as above. PLAN: * The patient will return to Vascular Interventional Radiology (VIR) for routine cholecystostomy drainage catheter evaluation and exchange in 8 weeks. 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 IVC filters can cause complications when left in place for extended periods of time. Patient will be referred to vascular interventional radiology clinic for evaluation for removal. Roanna Banning, MD [facility] Radiology Electronically Signed   By: Roanna Banning M.D.   On: 03/28/2022 20:38    Anti-infectives: Anti-infectives (From admission, onward)    Start     Dose/Rate Route Frequency Ordered Stop   03/23/22 2000  piperacillin-tazobactam (ZOSYN) IVPB 3.375 g        3.375 g 12.5 mL/hr over 240 Minutes Intravenous Every 8 hours 03/23/22 1837     03/23/22 1800  piperacillin-tazobactam (ZOSYN) IVPB 3.375 g  Status:  Discontinued        3.375 g 100 mL/hr over 30 Minutes Intravenous Every 6 hours 03/23/22 1540 03/23/22 1838       Assessment/Plan: Acute on chronic cholecystitis, s/p perc chole tube 01/30/2022, dislodged 03/11/2022.  Was going to leave out until follow up appt with Dr. Magnus Ivan but she came back to ED with s/s of acute cholecystitis.  S/p perc chole tube 9/12. Recommend a total of 7-10 days abx. CCS will sign off. Follow up with Dr. Magnus Ivan.    LOS: 6 days   Hosie Spangle, PA-C  General & Trauma Surgery  [facility], [place] [phone] for weekday/non holidays Check [email or web address] for coverage night/weekend/holidays

## 2022-03-29 NOTE — Progress Notes (Addendum)
ANTICOAGULATION CONSULT NOTE - Initial Consult  Pharmacy Consult for warfarin Indication: atrial fibrillation  No Known Allergies  Patient Measurements: Height: 5\' 2"  (157.5 cm) Weight: 68 kg (150 lb) IBW/kg (Calculated) : 50.1   Vital Signs: Temp: 98.8 F (37.1 C) (09/13 0838) Temp Source: Oral (09/13 0838) BP: 111/87 (09/13 0838) Pulse Rate: 97 (09/13 0838)  Labs: Recent Labs    03/27/22 0456 03/27/22 0943 03/28/22 0539 03/29/22 0512  HGB 12.6  --   --   --   HCT 40.2  --   --   --   PLT 236  --   --   --   LABPROT  --  20.6* 17.5* 16.5*  INR  --  1.8* 1.5* 1.4*  CREATININE 0.99  --   --   --     Estimated Creatinine Clearance: 41 mL/min (by C-G formula based on SCr of 0.99 mg/dL).   Medical History: Past Medical History:  Diagnosis Date   Atrial fibrillation with normal ventricular rate (HCC)    CHF (congestive heart failure) (HCC)    COPD (chronic obstructive pulmonary disease) (HCC)    Diabetes mellitus without complication (HCC)    Hx of blood clots    Hypertension     Medications:  Scheduled:   brimonidine  1 drop Left Eye BID   busPIRone  10 mg Oral BID   calcium-vitamin D  1 tablet Oral BID   dorzolamide  1 drop Left Eye BID   furosemide  20 mg Oral Daily   gabapentin  300 mg Oral BID   insulin aspart  0-9 Units Subcutaneous TID WC   latanoprost  1 drop Both Eyes QHS   lidocaine  1 patch Transdermal Q24H   magnesium oxide  400 mg Oral BID   metoprolol succinate  12.5 mg Oral Daily   pantoprazole  40 mg Oral Daily   predniSONE  7.5 mg Oral Daily   sodium chloride flush  5 mL Intracatheter Q8H   Infusions:   piperacillin-tazobactam (ZOSYN)  IV 3.375 g (03/29/22 0425)    Assessment: 81 yo admitted with acute on chronic cholecystitis and inadvertent drain removal. Drain successfully replaced on 9/12. Warfarin has been on hold since admission for drain replacement and was supratherapeutic with INR of 3.2 as well. Now to resume warfarin today  9/13. INR 1.4 this AM.  Patient's home warfarin dose reportedly 4mg  daily except 2mg  on TuThSat  Goal of Therapy:  INR 2-3 Monitor platelets by anticoagulation protocol: Yes   Plan:  Give warfarin 5mg  today at 1600 Daily INR Monitor for signs/symptoms of bleeding Adding Lovenox 1mg /kg SQ q12 until INR therapeutic   10/13, PharmD, BCPS Secure Chat if ?s 03/29/2022 9:37 AM

## 2022-03-29 NOTE — TOC Progression Note (Signed)
Transition of Care [facility]) - Progression Note    Patient Details  Name: Jill Crane MRN: [id number] Date of Birth: 06/30/1941  Transition of Care [facility]) CM/SW Contact  Otelia Santee, LCSW Phone Number: 03/29/2022, 12:02 PM  Clinical Narrative:    HHPT/OT has been arranged with [facility]. Pt will need HH orders placed prior to discharge. No further TOC needs identified for this pt at this time.    Expected Discharge Plan: Home w Home Health Services Barriers to Discharge: Continued Medical Work up  Expected Discharge Plan and Services Expected Discharge Plan: Home w Home Health Services In-house Referral: NA Discharge Planning Services: NA Post Acute Care Choice: Home Health Living arrangements for the past 2 months: Single Family Home                 DME Arranged: N/A DME Agency: NA       HH Arranged: PT, OT HH Agency: [facility] Date HH Agency Contacted: 03/29/22 Time HH Agency Contacted: 1202 Representative spoke with at [facility] Agency: Duwaine Maxin   Social Determinants of Health (SDOH) Interventions    Readmission Risk Interventions    03/28/2022    1:53 PM 02/02/2022   10:51 AM  Readmission Risk Prevention Plan  Post Dischage Appt Complete Complete  Medication Screening Complete Complete  Transportation Screening Complete Complete

## 2022-03-30 DIAGNOSIS — K810 Acute cholecystitis: Secondary | ICD-10-CM

## 2022-03-30 LAB — GLUCOSE, CAPILLARY
Glucose-Capillary: 113 mg/dL — ABNORMAL HIGH (ref 70–99)
Glucose-Capillary: 182 mg/dL — ABNORMAL HIGH (ref 70–99)

## 2022-03-30 LAB — PROTIME-INR
INR: 1.3 — ABNORMAL HIGH (ref 0.8–1.2)
Prothrombin Time: 16.2 seconds — ABNORMAL HIGH (ref 11.4–15.2)

## 2022-03-30 MED ORDER — WARFARIN SODIUM 6 MG PO TABS
6.0000 mg | ORAL_TABLET | Freq: Once | ORAL | Status: DC
  Filled 2022-03-30: qty 1

## 2022-03-30 MED ORDER — ENOXAPARIN SODIUM 80 MG/0.8ML IJ SOSY: 70.0000 mg | PREFILLED_SYRINGE | Freq: Two times a day (BID) | INTRAMUSCULAR | 0 refills | Status: DC

## 2022-03-30 NOTE — Progress Notes (Signed)
Physical Therapy Treatment Patient Details Name: Jill Crane MRN: [id number] DOB: 1940-12-13 Today's Date: 03/30/2022   History of Present Illness 81 y.o. 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 A year ago she had a stone in her bile duct and underwent ERCP for removal of this. She refused a cholecystectomy after that. She then presented this year in July with sepsis secondary to acute cholecystitis.  Pt admitted 03/23/22 for Acute on chronic cholecystitis and pending perc cholecystotomy tube by IR.    PT Comments     Patient demonstrates improved mobility.  Patient ambulated x 54' with RW.  Patient does report that  her right foot is painful, antalgic on right foot. Patient tolerated ambulation.  Recommendations for follow up therapy are one component of a multi-disciplinary discharge planning process, led by the attending physician.  Recommendations may be updated based on patient status, additional functional criteria and insurance authorization.  Follow Up Recommendations  Home health PT     Assistance Recommended at Discharge Frequent or constant Supervision/Assistance  Patient can return home with the following A little help with walking and/or transfers;A little help with bathing/dressing/bathroom;Help with stairs or ramp for entrance;Assistance with cooking/housework;Assist for transportation   Equipment Recommendations  None recommended by PT    Recommendations for Other Services       Precautions / Restrictions Precautions Precautions: Fall Precaution Comments: right side drain     Mobility  Bed Mobility   Bed Mobility: Supine to Sit     Supine to sit: Supervision, HOB elevated     General bed mobility comments: extra time    Transfers Overall transfer level: Needs assistance Equipment used: Rolling walker (2  wheels) Transfers: Sit to/from Stand Sit to Stand: Min assist           General transfer comment: steady assist from bed and BSC    Ambulation/Gait Ambulation/Gait assistance: Min assist Gait Distance (Feet): 60 Feet (then 10) Assistive device: Rolling walker (2 wheels) Gait Pattern/deviations: Step-to pattern, Step-through pattern, Antalgic Gait velocity: decr     General Gait Details: antalgic on right foot. Patient improved with distance.   Stairs             Wheelchair Mobility    Modified Rankin (Stroke Patients Only)       Balance Overall balance assessment: Needs assistance   Sitting balance-Leahy Scale: Good Sitting balance - Comments: able to lean forward to pull up shoes   Standing balance support: Single extremity supported Standing balance-Leahy Scale: Fair Standing balance comment: standing from [facility], perform pericare                            Cognition Arousal/Alertness: Awake/alert Behavior During Therapy: WFL for tasks assessed/performed Overall Cognitive Status: Within Functional Limits for tasks assessed                                 General Comments: asking if she had her gabapentin since her right foot was painful        Exercises      General Comments        Pertinent Vitals/Pain Pain Assessment Faces Pain Scale: Hurts whole lot Pain Location: right foot, especially with WB, antalgic Pain Descriptors / Indicators: Sore, Pins and needles,  Discomfort Pain Intervention(s): Monitored during session    Home Living                          Prior Function            PT Goals (current goals can now be found in the care plan section) Progress towards PT goals: Progressing toward goals    Frequency    Min 3X/week      PT Plan Current plan remains appropriate;Discharge plan needs to be updated    Co-evaluation              AM-PAC PT "6 Clicks" Mobility   Outcome  Measure  Help needed turning from your back to your side while in a flat bed without using bedrails?: A Little Help needed moving from lying on your back to sitting on the side of a flat bed without using bedrails?: A Little Help needed moving to and from a bed to a chair (including a wheelchair)?: A Little Help needed standing up from a chair using your arms (e.g., wheelchair or bedside chair)?: A Little Help needed to walk in hospital room?: A Little Help needed climbing 3-5 steps with a railing? : A Lot 6 Click Score: 17    End of Session Equipment Utilized During Treatment: Gait belt Activity Tolerance: Patient tolerated treatment well Patient left: in chair;with call bell/phone within reach;with chair alarm set Nurse Communication: Mobility status       Time: 5093-2671 PT Time Calculation (min) (ACUTE ONLY): 24 min  Charges:  $Gait Training: 8-22 mins $Self Care/Home Management: 8-22                     Blanchard Kelch PT Acute Rehabilitation Services Office [phone] Weekend [phone]    Rada Hay 03/30/2022, 2:32 PM

## 2022-03-30 NOTE — Progress Notes (Signed)
Patient ID: Jill Crane, female   DOB: May 02, 1941, 81 y.o.   MRN: [id number] Pt being dc'd home today with GB drain intact; she is stable, drain functioning ok, daughter aware of drain care/how to flush; pt will be scheduled for GB drain exchange in 6-8 weeks-order placed; daughter instructed to call [phone] with any drain related questions; pt should also f/u with [facility].

## 2022-03-30 NOTE — Progress Notes (Signed)
Verbal and written AVS discharge paperwork reviewed with Jill Crane and daughter, Jill Crane who is at bedside. Jill Crane to DC with biliary drain. Daughter Jill Crane verbalized able to flush drain and how to drain and document on paper. Supplies given for flushes, measurement cup, and gauze to change dressing. Wheelchair to lobby to go home with daughter. AVS understandment verbalized by pt and daughter. Pt knows to follow up with vascular, and IR.

## 2022-03-30 NOTE — Progress Notes (Addendum)
ANTICOAGULATION CONSULT NOTE  Pharmacy Consult for warfarin Indication: atrial fibrillation  No Known Allergies  Patient Measurements: Height: 5\' 2"  (157.5 cm) Weight: 68 kg (150 lb) IBW/kg (Calculated) : 50.1   Vital Signs: Temp: 98.1 F (36.7 C) (09/14 0452) Temp Source: Oral (09/14 0452) BP: 123/73 (09/14 0452) Pulse Rate: 84 (09/14 0452)  Labs: Recent Labs    03/28/22 0539 03/29/22 0512 03/30/22 0532  LABPROT 17.5* 16.5* 16.2*  INR 1.5* 1.4* 1.3*     Estimated Creatinine Clearance: 41 mL/min (by C-G formula based on SCr of 0.99 mg/dL).  Medications:  Scheduled:   brimonidine  1 drop Left Eye BID   busPIRone  10 mg Oral BID   calcium-vitamin D  1 tablet Oral BID   dorzolamide  1 drop Left Eye BID   enoxaparin (LOVENOX) injection  70 mg Subcutaneous Q12H   furosemide  20 mg Oral Daily   gabapentin  300 mg Oral BID   insulin aspart  0-9 Units Subcutaneous TID WC   latanoprost  1 drop Both Eyes QHS   lidocaine  1 patch Transdermal Q24H   magnesium oxide  400 mg Oral BID   metoprolol succinate  12.5 mg Oral Daily   pantoprazole  40 mg Oral Daily   predniSONE  7.5 mg Oral Daily   sodium chloride flush  5 mL Intracatheter Q8H   Warfarin - Pharmacist Dosing Inpatient   Does not apply q1600   Infusions:   piperacillin-tazobactam (ZOSYN)  IV 3.375 g (03/30/22 0400)    Assessment: 81 yo admitted with acute on chronic cholecystitis and inadvertent drain removal. Drain successfully replaced on 9/12. Warfarin has been on hold since admission for drain replacement and was supratherapeutic with INR of 3.2 as well. Now to resume warfarin today 9/13.  Home warfarin dose reportedly 4mg  daily except 2mg  on TuThSat  Today, 03/30/2022: No CBC today; WNL as of 9/11 INR remains subtherapeutic and continues to decrease Eating 75% of meals No major DDI noted with warfarin; continues on broad-spectrum abx, which can modulate gut flora to increase warfarin sensitivity  Goal  of Therapy:  INR 2-3 Monitor platelets by anticoagulation protocol: Yes   Plan:  Warfarin 6 mg PO tonight x 1 Daily INR CBC tomorrow Monitor for signs/symptoms of bleeding Adding Lovenox 1mg /kg SQ q12 until INR therapeutic  , PharmD, BCPS [phone] 03/30/2022, 10:25 AM

## 2022-03-31 ENCOUNTER — Telehealth: Payer: Self-pay | Admitting: Vascular Surgery

## 2022-03-31 NOTE — Telephone Encounter (Signed)
-----   Message from Victorino Sparrow, MD sent at 03/30/2022  4:24 PM EDT ----- Please schedule for a physician next week - [place]

## 2022-04-02 LAB — AEROBIC/ANAEROBIC CULTURE W GRAM STAIN (SURGICAL/DEEP WOUND)

## 2022-04-05 NOTE — Discharge Summary (Signed)
Physician Discharge Summary   Patient: Jill Crane MRN: [id number] DOB: 12-08-1940  Admit date:     03/23/2022  Discharge date: 03/30/2022  Discharge Physician: Kathlen ModyVijaya Chanson Teems   PCP: Zoila ShutterWoodyear, Wynne E, MD   Recommendations at discharge:  Please follow up with PCP In one week.  Please follow up with vascular surgery as recommended.   Discharge Diagnoses: Principal Problem:   Acute on chronic cholecystitis Active Problems:   Atrial fibrillation (HCC)   Diabetes (HCC)   AKI (acute kidney injury) (HCC)   Chronic diastolic CHF (congestive heart failure) (HCC)   Hypotension   History of pemphigus    Hospital Course: Jill Crane is a 81 y.o. 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 A year ago she had a stone in her bile duct and underwent ERCP for removal of this. She refused a cholecystectomy after that. She then presented this year in July with sepsis secondary to acute cholecystitis.  she underwent percutaneous cholecystostomy tube placemen on 01/30/2022. She was due to have a cholangiogram but the drain inadvertently fell out 03/13/2022.She begin to have symptoms of abdominal discomfort and had an episode of vomiting 2 days ago.   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  No loculated fluid collection.   General surgery, IR and cardiology consulted and on board. Patient seen and examined at bedside.  Her INR is 1.5 on 03/28/22 and she underwent perc drain in the Gall bladder.   Assessment and Plan:  Acute on chronic cholecystitis:  - CT abd and pelvis reveal recurrence of cholecystitis.  - started on IV zosyn, and pain control.  - gen surgery and IR on board.  - INR is 1.5, she underwent perc drain in the Gall bladder.  - she remains afebrile, no leukocytosis. - her symptoms of  nausea, vomiting and abdominal pain have resolved.  - restarted her diet and plan for discharge in the next 24 hours if no symptoms.    H/o Pemphigus:  Continue with prednisone.      Hypotension; resolved.  BP parameters are optimal.    H/o chronic diastolic Chf She appears to be compensated.  Continue with lasix 20 mg daiy.      AKI;  Creatinine back to baseline      Atrial fibrillation  On coumadin for anti coagulation, which is on hold.  Continue to monitor.  Rate controlled with metoprolol 12.5 mg daily.  INR is 1.5 today.  She was started o nlovenox injections for bridging with coumadin.      Type 2 DM cbgs are better controlled.   Continue with SSI.  Last A1c is 6.8%. no change in meds. O metformin at home.    Diabetic neuropathy:  She is on gabapentin 300 mg prn at bedtime.    COPD:  No wheezing heard. Pt on RA with good sats.      Staph epidermidis bacteremia:  In 1 out of 4 bottles.  PROBABLY a contaminant.    Bilateral lower extremity pain / bilateral foot pain.  On gabapentin.  ABI'S Ordered. abnormal ABI'S discussed with vascular surgery , recommended outpatient follow up with vascular surgery on discharge.  Inpatient referral sent.     Consultants: IR GI SURGERY  Procedures performed: cholecystostomy tube placement.   Disposition: Home Diet recommendation:  Discharge Diet Orders (From admission, onward)  Start     Ordered   03/30/22 0000  Diet - low sodium heart healthy        03/30/22 1230           Regular diet DISCHARGE MEDICATION: Allergies as of 03/30/2022   No Known Allergies      Medication List     TAKE these medications    acetaminophen 500 MG tablet Commonly known as: TYLENOL Take 500 mg by mouth every 6 (six) hours as needed for mild pain.   albuterol 108 (90 Base) MCG/ACT inhaler Commonly known as: VENTOLIN HFA Inhale 2 puffs into the lungs every 6 (six) hours as needed for wheezing or shortness of  breath.   alendronate 35 MG tablet Commonly known as: FOSAMAX Take 35 mg by mouth every Monday. Take with a full glass of water on an empty stomach.   bimatoprost 0.03 % ophthalmic solution Commonly known as: LUMIGAN Place 1 drop into both eyes at bedtime.   brimonidine 0.2 % ophthalmic solution Commonly known as: ALPHAGAN Place 1 drop into the left eye in the morning and at bedtime.   busPIRone 10 MG tablet Commonly known as: BUSPAR Take 10 mg by mouth 2 (two) times daily.   calcium-vitamin D 500-200 MG-UNIT tablet Commonly known as: OSCAL WITH D Take 1 tablet by mouth.   dorzolamide 2 % ophthalmic solution Commonly known as: TRUSOPT Place 1 drop into the left eye 2 (two) times daily.   enoxaparin 80 MG/0.8ML injection Commonly known as: LOVENOX Inject 0.7 mLs (70 mg total) into the skin every 12 (twelve) hours for 5 days.   fluocinonide ointment 0.05 % Commonly known as: LIDEX Apply 1 Application topically 2 (two) times daily as needed (scalp irritation).   fluticasone 50 MCG/ACT nasal spray Commonly known as: FLONASE Place 2 sprays into both nostrils daily as needed for allergies.   furosemide 20 MG tablet Commonly known as: LASIX Take 1 tablet (20 mg total) by mouth daily.   gabapentin 300 MG capsule Commonly known as: NEURONTIN Take 300 mg by mouth at bedtime.   magnesium oxide 400 (241.3 Mg) MG tablet Commonly known as: MAG-OX Take 400 mg by mouth in the morning and at bedtime.   metFORMIN 500 MG tablet Commonly known as: GLUCOPHAGE Take 500 mg by mouth 2 (two) times daily.   metoprolol succinate 25 MG 24 hr tablet Commonly known as: TOPROL-XL Take 12.5 mg by mouth daily.   Normal Saline Flush 0.9 % Soln Inject 5 mLs by Intracatheter route daily.   omeprazole 20 MG capsule Commonly known as: PRILOSEC Take 20 mg by mouth daily.   predniSONE 2.5 MG tablet Commonly known as: DELTASONE Take 7.5 mg by mouth daily.   silver sulfADIAZINE 1 %  cream Commonly known as: SILVADENE Apply 1 Application topically daily as needed (scalp irritation).   warfarin 2 MG tablet Commonly known as: COUMADIN On Saturday and Sunday, take 2 tabs (4mg ), then resume normal regimen Take 1 tablet (2 mg) daily except Take 2 tablets (4 mg) on (Tues & Fri) What changed:  how much to take how to take this when to take this additional instructions        Follow-up Information     Coralie Keens, MD. Schedule an appointment as soon as possible for a visit in 4 week(s).   Specialty: General Surgery Why: for follow up with a general surgeon regarding your gallbladder drain. Contact information: [place] [address] [phone]  Nada Libman, MD. Schedule an appointment as soon as possible for a visit in 1 week(s).   Specialties: Vascular Surgery, Cardiology Why: for severe PAD. Contact information: [address] [place] [address] [phone]         Zoila Shutter, MD. Schedule an appointment as soon as possible for a visit in 1 week(s).   Specialty: Internal Medicine Contact information: [address] [place] [address] [phone]         [facility] Follow up.   Why: [facility] is to provide home health physical and occupational therapy               Discharge Exam: Filed Weights   03/23/22 1339  Weight: 68 kg   General exam: Appears calm and comfortable  Respiratory system: Clear to auscultation. Respiratory effort normal. Cardiovascular system: S1 & S2 heard, RRR. No JVD, murmurs, rubs, gallops or clicks. No pedal edema. Gastrointestinal system: Abdomen is nondistended, soft and nontender. No organomegaly or masses felt. Normal bowel sounds heard. Central nervous system: Alert and oriented. No focal neurological deficits. Extremities: Symmetric 5 x 5 power. Skin: No rashes, lesions or ulcers Psychiatry: Judgement and insight  appear normal. Mood & affect appropriate.    Condition at discharge: fair  The results of significant diagnostics from this hospitalization (including imaging, microbiology, ancillary and laboratory) are listed below for reference.   Imaging Studies: VAS [place] ABI WITH/WO TBI  Result Date: 03/30/2022  LOWER EXTREMITY DOPPLER STUDY Patient Name:  LAURRIE TOPPIN  Date of Exam:   03/29/2022 Medical Rec #: [id number]       Accession #:    [id number] Date of Birth: Dec 01, 1940       Patient Gender: F Patient Age:   54 years Exam Location:  [facility] Procedure:      VAS [place] ABI WITH/WO TBI Referring Phys: Kathlen Mody --------------------------------------------------------------------------------  Indications: Rest pain. High Risk Factors: Hypertension, past history of smoking. Other Factors: AFIB, CHF.  Comparison Study: No previous exams Performing Technologist: Hill, Jody RVT, RDMS  Examination Guidelines: A complete evaluation includes at minimum, Doppler waveform signals and systolic blood pressure reading at the level of bilateral brachial, anterior tibial, and posterior tibial arteries, when vessel segments are accessible. Bilateral testing is considered an integral part of a complete examination. Photoelectric Plethysmograph (PPG) waveforms and toe systolic pressure readings are included as required and additional duplex testing as needed. Limited examinations for reoccurring indications may be performed as noted.  ABI Findings: +---------+------------------+-----+----------+--------+ Right    Rt Pressure (mmHg)IndexWaveform  Comment  +---------+------------------+-----+----------+--------+ Brachial 124                    triphasic          +---------+------------------+-----+----------+--------+ PTA      76                0.61 monophasic         +---------+------------------+-----+----------+--------+ DP       81                0.65 monophasic          +---------+------------------+-----+----------+--------+ Great Toe38                0.31 Abnormal           +---------+------------------+-----+----------+--------+ +---------+------------------+-----+----------+-------+ Left     Lt Pressure (mmHg)IndexWaveform  Comment +---------+------------------+-----+----------+-------+ Brachial 101  monophasic        +---------+------------------+-----+----------+-------+ PTA                             absent            +---------+------------------+-----+----------+-------+ DP       42                0.34                   +---------+------------------+-----+----------+-------+ Great Toe                       Absent            +---------+------------------+-----+----------+-------+ +-------+-----------+-----------+------------+------------+ ABI/TBIToday's ABIToday's TBIPrevious ABIPrevious TBI +-------+-----------+-----------+------------+------------+ Right  0.66                                           +-------+-----------+-----------+------------+------------+ Left   0.34                                           +-------+-----------+-----------+------------+------------+  A >20 mmHg difference on UE pressures and monophasic doppler waveform suggests 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 Patient also complains of occasional pain / discomfort to the LUE.  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 The right toe-brachial index is abnormal. 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 Unable to obtain doppler waveform or pressure for posterior tibial artery. The left TBI is absent. *See table(s) above for measurements and observations.  Vascular consult recommended. Electronically signed by Heath Lark on 03/30/2022 at 4:24:02 PM.    Final    IR Perc  Cholecystostomy  Result Date: 03/28/2022 INDICATION: Acute cholecystitis. Interval removal of prior cholecystostomy tube placed 01/30/2022. EXAM: ULTRASOUND AND FLUOROSCOPIC-GUIDED CHOLECYSTOSTOMY TUBE PLACEMENT COMPARISON:  CT AP, 03/23/2022. MEDICATIONS: The patient is currently admitted to the hospital and on intravenous antibiotics. Antibiotics were administered within an appropriate time frame prior to skin puncture. ANESTHESIA/SEDATION: Moderate (conscious) sedation was employed during this procedure. A total of Versed 0.5 mg and Fentanyl 50 mcg was administered intravenously. Moderate Sedation Time: 20 minutes. The patient's level of consciousness and vital signs were monitored continuously by radiology nursing throughout the procedure under my direct supervision. CONTRAST:  52mL OMNIPAQUE IOHEXOL 300 MG/ML SOLN - administered into the gallbladder fossa. FLUOROSCOPY TIME:  Fluoroscopic dose; 8 mGy COMPLICATIONS: None immediate. PROCEDURE: Informed written consent was obtained from the the patient and/or patient's representative after a discussion of the risks, benefits and alternatives to treatment. Questions regarding the procedure were encouraged and answered. A timeout was performed prior to the initiation of the procedure. The RIGHT upper abdominal quadrant was prepped and draped in the usual sterile fashion, and a sterile drape was applied covering the operative field. Maximum barrier sterile technique with sterile gowns and gloves were used for the procedure. A timeout was performed prior to the initiation of the procedure. Local anesthesia was provided with 1% lidocaine with epinephrine. Ultrasound scanning of the right upper quadrant demonstrates a markedly dilated gallbladder. Utilizing a transhepatic approach, a 22 gauge needle was advanced into the gallbladder under direct ultrasound guidance. An ultrasound  image was saved for documentation purposes. Appropriate intraluminal puncture was  confirmed with the efflux of bile and advancement of an 0.018 wire into the gallbladder lumen. The needle was exchanged for an Accustick set. A small amount of contrast was injected to confirm appropriate intraluminal positioning. Over a short Amplatz wire, a 10 Fr cholecystomy tube was advanced into the gallbladder fossa, coiled and locked. Bile was aspirated and a small amount of contrast was injected as several post procedural spot radiographic images were obtained in various obliquities. The catheter was secured to the skin with suture, connected to a drainage bag and a dressing was placed. The patient tolerated the procedure well without immediate post procedural complication. IMPRESSION: Successful placement of a 10 Fr cholecystostomy drainage tube via transhepatic approach, as above. PLAN: * The patient will return to Vascular Interventional Radiology (VIR) for routine cholecystostomy drainage catheter evaluation and exchange in 8 weeks. 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 IVC filters can cause complications when left in place for extended periods of time. Patient will be referred to vascular interventional radiology clinic for evaluation for removal. Roanna Banning, MD [facility] Electronically Signed   By: Roanna Banning M.D.   On: 03/28/2022 20:38   CT ABDOMEN PELVIS W CONTRAST  Result Date: 03/23/2022 CLINICAL DATA:  Abdominal pain x2 days EXAM: CT ABDOMEN AND PELVIS WITH CONTRAST TECHNIQUE: Multidetector CT imaging of the abdomen and pelvis was performed using the standard protocol following bolus administration of intravenous contrast. RADIATION DOSE REDUCTION: This exam was performed according to the departmental dose-optimization program which includes automated exposure control, adjustment of the mA and/or kV according to patient size and/or use of iterative reconstruction technique. CONTRAST:   OMNIPAQUE IOHEXOL 300 MG/ML  SOLN COMPARISON:  01/20/2021 FINDINGS: Lower chest: Heart is enlarged in size. Visualized lower lung fields are clear. Hepatobiliary: Air is seen in the intrahepatic and extrahepatic bile ducts. This may suggest a previous sphincterotomy. There is fluid around the gallbladder. There is inflammatory stranding in the fat planes immediately behind the right rectus muscle close to the fundus of the gallbladder. There are pockets of air in the gallbladder and pericholecystic region. Clinical history suggests that the patient had placement of cholecystostomy tube which has fallen out. Air in and adjacent to the gallbladder may be related to recently removed cholecystostomy tube all suggest emphysematous cholecystitis. There is no loculated thick-walled fluid collection adjacent to the gallbladder. Pancreas: No focal abnormalities are seen. Spleen: Unremarkable. Adrenals/Urinary Tract: Adrenals are unremarkable. 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 There is no hydronephrosis. There are few low-density lesions in kidneys, possibly cysts. There are no renal or ureteral stones. Urinary bladder is unremarkable. Stomach/Bowel: Small hiatal hernia is seen. There is no significant dilation of small bowel loops. The appendix is unremarkable. Scattered diverticula are seen in colon without signs of focal diverticulitis. Vascular/Lymphatic: Atherosclerotic plaques and calcifications are seen in aorta and its major branches. 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 Reproductive: Unremarkable. Other: There is no ascites or significant pneumoperitoneum. Umbilical hernia containing fat is seen. Musculoskeletal: There is minimal anterolisthesis at the L4-L5 level. There is mild to moderate spinal stenosis at the L4-L5 level. There is encroachment of neural foramina at multiple levels. IMPRESSION: There is fluid around the gallbladder. There are  pockets of air in and adjacent to the gallbladder. Clinical history suggests recent  placement of cholecystostomy catheter which may have fallen out. These changes could be related to recent placement of cholecystostomy catheter. Another diagnostic possibility would be emphysematous cholecystitis. There is stranding in the fat planes adjacent to the fundus of the gallbladder without any thick-walled loculated fluid collections. Air in the lumen of bile ducts may suggest previous sphincterotomy. There is no evidence of intestinal obstruction or pneumoperitoneum. There is no hydronephrosis. Diverticulosis of colon without focal diverticulitis. Other findings as described in the body of the report. Imaging finding of air in and around the gallbladder was discussed with Dr. Jacqulyn Bath by telephone call. Electronically Signed   By: Ernie Avena M.D.   On: 03/23/2022 15:29    Microbiology: Results for orders placed or performed during the hospital encounter of 03/23/22  Culture, blood (routine x 2)     Status: None   Collection Time: 03/23/22  4:31 PM   Specimen: BLOOD RIGHT FOREARM  Result Value Ref Range Status   Specimen Description   Final    BLOOD RIGHT FOREARM Performed at [facility], [address] [facility] [address] [place], [place] [address]    Special Requests   Final    BOTTLES DRAWN AEROBIC AND ANAEROBIC Blood Culture adequate volume Performed at [facility], [address], [place], [place] [address]    Culture   Final    NO GROWTH 5 DAYS Performed at [facility], [address], [place], [place] [address]    Report Status 03/28/2022 FINAL  Final  Culture, blood (routine x 2)     Status: Abnormal   Collection Time: 03/23/22  6:49 PM   Specimen: BLOOD LEFT HAND  Result Value Ref Range Status   Specimen Description   Final    BLOOD LEFT HAND Performed at [facility], [address] [place], [place] [address]    Special Requests   Final    BOTTLES DRAWN AEROBIC  ONLY Blood Culture results may not be optimal due to an inadequate volume of blood received in culture bottles Performed at [facility], [address], [place], [place] [address]    Culture  Setup Time   Final    GRAM POSITIVE COCCI IN CLUSTERS AEROBIC BOTTLE ONLY CRITICAL RESULT CALLED TO, READ BACK BY AND VERIFIED WITH: PHARMD J. LEGGE [id number]  fh    Culture (A)  Final    STAPHYLOCOCCUS EPIDERMIDIS THE SIGNIFICANCE OF ISOLATING THIS ORGANISM FROM A SINGLE VENIPUNCTURE CANNOT BE PREDICTED WITHOUT FURTHER CLINICAL AND CULTURE CORRELATION. SUSCEPTIBILITIES AVAILABLE ONLY ON REQUEST. Performed at [facility], [address] [place], [place] [address]    Report Status 03/25/2022 FINAL  Final  Blood Culture ID Panel (Reflexed)     Status: Abnormal   Collection Time: 03/23/22  6:49 PM  Result Value Ref Range Status   Enterococcus faecalis NOT DETECTED NOT DETECTED Final   Enterococcus Faecium NOT DETECTED NOT DETECTED Final   Listeria monocytogenes NOT DETECTED NOT DETECTED Final   Staphylococcus species DETECTED (A) NOT DETECTED Final    Comment: CRITICAL RESULT CALLED TO, READ BACK BY AND VERIFIED WITH: PHARMD J. LEGGE [id number]  fh    Staphylococcus aureus (BCID) NOT DETECTED NOT DETECTED Final   Staphylococcus epidermidis DETECTED (A) NOT DETECTED Final    Comment: CRITICAL RESULT CALLED TO, READ BACK BY AND VERIFIED WITH: PHARMD J. LEGGE [id number]  fh    Staphylococcus lugdunensis NOT DETECTED NOT DETECTED Final   Streptococcus species NOT  DETECTED NOT DETECTED Final   Streptococcus agalactiae NOT DETECTED NOT DETECTED Final   Streptococcus pneumoniae NOT DETECTED NOT DETECTED Final   Streptococcus pyogenes NOT DETECTED NOT DETECTED Final   A.calcoaceticus-baumannii NOT DETECTED NOT DETECTED Final   Bacteroides fragilis NOT DETECTED NOT DETECTED Final   Enterobacterales NOT DETECTED NOT DETECTED Final   Enterobacter cloacae complex NOT  DETECTED NOT DETECTED Final   Escherichia coli NOT DETECTED NOT DETECTED Final   Klebsiella aerogenes NOT DETECTED NOT DETECTED Final   Klebsiella oxytoca NOT DETECTED NOT DETECTED Final   Klebsiella pneumoniae NOT DETECTED NOT DETECTED Final   Proteus species NOT DETECTED NOT DETECTED Final   Salmonella species NOT DETECTED NOT DETECTED Final   Serratia marcescens NOT DETECTED NOT DETECTED Final   Haemophilus influenzae NOT DETECTED NOT DETECTED Final   Neisseria meningitidis NOT DETECTED NOT DETECTED Final   Pseudomonas aeruginosa NOT DETECTED NOT DETECTED Final   Stenotrophomonas maltophilia NOT DETECTED NOT DETECTED Final   Candida albicans NOT DETECTED NOT DETECTED Final   Candida auris NOT DETECTED NOT DETECTED Final   Candida glabrata NOT DETECTED NOT DETECTED Final   Candida krusei NOT DETECTED NOT DETECTED Final   Candida parapsilosis NOT DETECTED NOT DETECTED Final   Candida tropicalis NOT DETECTED NOT DETECTED Final   Cryptococcus neoformans/gattii NOT DETECTED NOT DETECTED Final   Methicillin resistance mecA/C NOT DETECTED NOT DETECTED Final    Comment: Performed at [facility], [address], [place], [place] [address]  Aerobic/Anaerobic Culture w Gram Stain (surgical/deep wound)     Status: None   Collection Time: 03/28/22  5:04 PM   Specimen: PATH Gallbladder; Tissue  Result Value Ref Range Status   Specimen Description   Final    TISSUE Performed at [facility], [address] [place], [place] [address]    Special Requests   Final    NONE GALLBLADDER Performed at [facility], [address] [place], [place] [address]    Gram Stain RARE WBC PRESENT, PREDOMINANTLY PMN RARE YEAST   Final   Culture   Final    RARE ENTEROBACTER AEROGENES RARE PSEUDOMONAS AERUGINOSA RARE CANDIDA GLABRATA NO ANAEROBES ISOLATED Performed at [facility], [address] [place], [place] [address]    Report Status 04/02/2022  FINAL  Final   Organism ID, Bacteria ENTEROBACTER AEROGENES  Final   Organism ID, Bacteria PSEUDOMONAS AERUGINOSA  Final      Susceptibility   Enterobacter aerogenes - MIC*    CEFAZOLIN >=64 RESISTANT Resistant     CEFEPIME <=0.12 SENSITIVE Sensitive     CEFTAZIDIME <=1 SENSITIVE Sensitive     CEFTRIAXONE <=0.25 SENSITIVE Sensitive     CIPROFLOXACIN <=0.25 SENSITIVE Sensitive     GENTAMICIN <=1 SENSITIVE Sensitive     IMIPENEM 2 SENSITIVE Sensitive     TRIMETH/SULFA <=20 SENSITIVE Sensitive     PIP/TAZO <=4 SENSITIVE Sensitive     * RARE ENTEROBACTER AEROGENES   Pseudomonas aeruginosa - MIC*    CEFTAZIDIME 4 SENSITIVE Sensitive     CIPROFLOXACIN 0.5 SENSITIVE Sensitive     GENTAMICIN 2 SENSITIVE Sensitive     IMIPENEM 2 SENSITIVE Sensitive     PIP/TAZO <=4 SENSITIVE Sensitive     CEFEPIME 2 SENSITIVE Sensitive     * RARE PSEUDOMONAS AERUGINOSA    Labs: CBC: No results for input(s): "WBC", "NEUTROABS", "HGB", "HCT", "MCV", "PLT" in the last 168 hours. Basic Metabolic Panel: No results for input(s): "NA", "K", "CL", "CO2", "  GLUCOSE", "BUN", "CREATININE", "CALCIUM", "MG", "PHOS" in the last 168 hours. Liver Function Tests: No results for input(s): "AST", "ALT", "ALKPHOS", "BILITOT", "PROT", "ALBUMIN" in the last 168 hours. CBG: Recent Labs  Lab 03/29/22 1206 03/29/22 1725 03/29/22 2126 03/30/22 0815 03/30/22 1156  GLUCAP 129* 166* 191* 113* 182*    Discharge time spent: 45 minutes.   Signed: Kathlen Mody, MD [facility] 04/05/2022

## 2022-04-07 ENCOUNTER — Other Ambulatory Visit: Payer: Self-pay

## 2022-04-07 ENCOUNTER — Emergency Department (HOSPITAL_COMMUNITY): Payer: Medicare Other

## 2022-04-07 ENCOUNTER — Encounter (HOSPITAL_COMMUNITY): Payer: Self-pay

## 2022-04-07 ENCOUNTER — Inpatient Hospital Stay (HOSPITAL_COMMUNITY)
Admission: EM | Admit: 2022-04-07 | Discharge: 2022-04-14 | DRG: 418 | Disposition: A | Discharge status: Home Health | Payer: Medicare Other | Attending: Internal Medicine | Admitting: Internal Medicine

## 2022-04-07 DIAGNOSIS — I10 Essential (primary) hypertension: Secondary | ICD-10-CM | POA: Diagnosis present

## 2022-04-07 DIAGNOSIS — J449 Chronic obstructive pulmonary disease, unspecified: Secondary | ICD-10-CM | POA: Diagnosis present

## 2022-04-07 DIAGNOSIS — E119 Type 2 diabetes mellitus without complications: Secondary | ICD-10-CM

## 2022-04-07 DIAGNOSIS — K219 Gastro-esophageal reflux disease without esophagitis: Secondary | ICD-10-CM | POA: Diagnosis present

## 2022-04-07 DIAGNOSIS — Z86718 Personal history of other venous thrombosis and embolism: Secondary | ICD-10-CM

## 2022-04-07 DIAGNOSIS — I4811 Longstanding persistent atrial fibrillation: Secondary | ICD-10-CM

## 2022-04-07 DIAGNOSIS — Z7983 Long term (current) use of bisphosphonates: Secondary | ICD-10-CM

## 2022-04-07 DIAGNOSIS — Z823 Family history of stroke: Secondary | ICD-10-CM

## 2022-04-07 DIAGNOSIS — Z833 Family history of diabetes mellitus: Secondary | ICD-10-CM

## 2022-04-07 DIAGNOSIS — Z87891 Personal history of nicotine dependence: Secondary | ICD-10-CM

## 2022-04-07 DIAGNOSIS — E1165 Type 2 diabetes mellitus with hyperglycemia: Secondary | ICD-10-CM | POA: Diagnosis not present

## 2022-04-07 DIAGNOSIS — D72829 Elevated white blood cell count, unspecified: Secondary | ICD-10-CM | POA: Clinically undetermined

## 2022-04-07 DIAGNOSIS — I495 Sick sinus syndrome: Secondary | ICD-10-CM | POA: Diagnosis present

## 2022-04-07 DIAGNOSIS — Z7902 Long term (current) use of antithrombotics/antiplatelets: Secondary | ICD-10-CM

## 2022-04-07 DIAGNOSIS — K812 Acute cholecystitis with chronic cholecystitis: Secondary | ICD-10-CM | POA: Diagnosis not present

## 2022-04-07 DIAGNOSIS — Z9049 Acquired absence of other specified parts of digestive tract: Secondary | ICD-10-CM

## 2022-04-07 DIAGNOSIS — F419 Anxiety disorder, unspecified: Secondary | ICD-10-CM | POA: Diagnosis present

## 2022-04-07 DIAGNOSIS — B3731 Acute candidiasis of vulva and vagina: Secondary | ICD-10-CM | POA: Diagnosis not present

## 2022-04-07 DIAGNOSIS — Z79899 Other long term (current) drug therapy: Secondary | ICD-10-CM

## 2022-04-07 DIAGNOSIS — K810 Acute cholecystitis: Principal | ICD-10-CM

## 2022-04-07 DIAGNOSIS — Z7984 Long term (current) use of oral hypoglycemic drugs: Secondary | ICD-10-CM

## 2022-04-07 DIAGNOSIS — L109 Pemphigus, unspecified: Secondary | ICD-10-CM | POA: Diagnosis present

## 2022-04-07 DIAGNOSIS — Z7901 Long term (current) use of anticoagulants: Secondary | ICD-10-CM

## 2022-04-07 DIAGNOSIS — I4891 Unspecified atrial fibrillation: Secondary | ICD-10-CM | POA: Diagnosis present

## 2022-04-07 DIAGNOSIS — I4892 Unspecified atrial flutter: Secondary | ICD-10-CM | POA: Diagnosis present

## 2022-04-07 DIAGNOSIS — I5032 Chronic diastolic (congestive) heart failure: Secondary | ICD-10-CM | POA: Diagnosis not present

## 2022-04-07 DIAGNOSIS — R109 Unspecified abdominal pain: Secondary | ICD-10-CM | POA: Diagnosis not present

## 2022-04-07 DIAGNOSIS — Z7952 Long term (current) use of systemic steroids: Secondary | ICD-10-CM

## 2022-04-07 DIAGNOSIS — I110 Hypertensive heart disease with heart failure: Secondary | ICD-10-CM | POA: Diagnosis present

## 2022-04-07 DIAGNOSIS — Z872 Personal history of diseases of the skin and subcutaneous tissue: Secondary | ICD-10-CM

## 2022-04-07 DIAGNOSIS — I4821 Permanent atrial fibrillation: Secondary | ICD-10-CM | POA: Diagnosis present

## 2022-04-07 DIAGNOSIS — E1151 Type 2 diabetes mellitus with diabetic peripheral angiopathy without gangrene: Secondary | ICD-10-CM | POA: Diagnosis present

## 2022-04-07 LAB — PROTIME-INR
INR: 2.8 — ABNORMAL HIGH (ref 0.8–1.2)
Prothrombin Time: 29.5 seconds — ABNORMAL HIGH (ref 11.4–15.2)

## 2022-04-07 LAB — COMPREHENSIVE METABOLIC PANEL
ALT: 17 U/L (ref 0–44)
AST: 26 U/L (ref 15–41)
Albumin: 3.1 g/dL — ABNORMAL LOW (ref 3.5–5.0)
Alkaline Phosphatase: 55 U/L (ref 38–126)
Anion gap: 8 (ref 5–15)
BUN: 15 mg/dL (ref 8–23)
CO2: 23 mmol/L (ref 22–32)
Calcium: 9.3 mg/dL (ref 8.9–10.3)
Chloride: 110 mmol/L (ref 98–111)
Creatinine, Ser: 0.93 mg/dL (ref 0.44–1.00)
GFR, Estimated: >60 mL/min (ref >60)
Glucose, Bld: 147 mg/dL — ABNORMAL HIGH (ref 70–99)
Potassium: 4.9 mmol/L (ref 3.5–5.1)
Sodium: 141 mmol/L (ref 135–145)
Total Bilirubin: 1.1 mg/dL (ref 0.3–1.2)
Total Protein: 7.6 g/dL (ref 6.5–8.1)

## 2022-04-07 LAB — CBC WITH DIFFERENTIAL/PLATELET
Abs Immature Granulocytes: 0.09 10*3/uL — ABNORMAL HIGH (ref 0.00–0.07)
Basophils Absolute: 0 10*3/uL (ref 0.0–0.1)
Basophils Relative: 0 %
Eosinophils Absolute: 0 10*3/uL (ref 0.0–0.5)
Eosinophils Relative: 0 %
HCT: 39 % (ref 36.0–46.0)
Hemoglobin: 12.5 g/dL (ref 12.0–15.0)
Immature Granulocytes: 1 %
Lymphocytes Relative: 9 %
Lymphs Abs: 1.4 10*3/uL (ref 0.7–4.0)
MCH: 27.3 pg (ref 26.0–34.0)
MCHC: 32.1 g/dL (ref 30.0–36.0)
MCV: 85.2 fL (ref 80.0–100.0)
Monocytes Absolute: 1.1 10*3/uL — ABNORMAL HIGH (ref 0.1–1.0)
Monocytes Relative: 7 %
Neutro Abs: 12.5 10*3/uL — ABNORMAL HIGH (ref 1.7–7.7)
Neutrophils Relative %: 83 %
Platelets: 330 10*3/uL (ref 150–400)
RBC: 4.58 MIL/uL (ref 3.87–5.11)
RDW: 18.6 % — ABNORMAL HIGH (ref 11.5–15.5)
WBC: 15.1 10*3/uL — ABNORMAL HIGH (ref 4.0–10.5)
nRBC: 0 % (ref 0.0–0.2)

## 2022-04-07 LAB — URINALYSIS, ROUTINE W REFLEX MICROSCOPIC
Bilirubin Urine: NEGATIVE
Glucose, UA: NEGATIVE mg/dL
Hgb urine dipstick: NEGATIVE
Ketones, ur: NEGATIVE mg/dL
Leukocytes,Ua: NEGATIVE
Nitrite: NEGATIVE
Protein, ur: NEGATIVE mg/dL
Specific Gravity, Urine: 1.013 (ref 1.005–1.030)
pH: 7 (ref 5.0–8.0)

## 2022-04-07 LAB — TROPONIN I (HIGH SENSITIVITY)
Troponin I (High Sensitivity): 7 ng/L (ref <18)
Troponin I (High Sensitivity): 7 ng/L (ref <18)

## 2022-04-07 LAB — GLUCOSE, CAPILLARY: Glucose-Capillary: 94 mg/dL (ref 70–99)

## 2022-04-07 LAB — LIPASE, BLOOD: Lipase: 22 U/L (ref 11–51)

## 2022-04-07 MED ORDER — HYDROCODONE-ACETAMINOPHEN 5-325 MG PO TABS
1.0000 | ORAL_TABLET | ORAL | Status: DC | PRN
  Administered 2022-04-07 – 2022-04-14 (×20): 1 via ORAL
  Filled 2022-04-07 (×21): qty 1

## 2022-04-07 MED ORDER — ACETAMINOPHEN 650 MG RE SUPP: 650.0000 mg | Freq: Four times a day (QID) | RECTAL | Status: DC | PRN

## 2022-04-07 MED ORDER — HYDROMORPHONE HCL 1 MG/ML IJ SOLN: 0.5000 mg | INTRAMUSCULAR | Status: DC | PRN

## 2022-04-07 MED ORDER — WARFARIN - PHARMACIST DOSING INPATIENT: Freq: Every day | Status: DC

## 2022-04-07 MED ORDER — PREDNISONE 5 MG PO TABS
7.5000 mg | ORAL_TABLET | Freq: Every day | ORAL | Status: DC
  Administered 2022-04-08 – 2022-04-14 (×6): 7.5 mg via ORAL
  Filled 2022-04-07 (×3): qty 1
  Filled 2022-04-07 (×2): qty 2
  Filled 2022-04-07 (×2): qty 1

## 2022-04-07 MED ORDER — WARFARIN SODIUM 2 MG PO TABS
2.0000 mg | ORAL_TABLET | Freq: Once | ORAL | Status: AC
  Administered 2022-04-07: 2 mg via ORAL
  Filled 2022-04-07: qty 1

## 2022-04-07 MED ORDER — PIPERACILLIN-TAZOBACTAM 3.375 G IVPB
3.3750 g | Freq: Three times a day (TID) | INTRAVENOUS | Status: DC
  Administered 2022-04-07 – 2022-04-12 (×14): 3.375 g via INTRAVENOUS
  Filled 2022-04-07 (×14): qty 50

## 2022-04-07 MED ORDER — ALBUTEROL SULFATE HFA 108 (90 BASE) MCG/ACT IN AERS: 2.0000 | INHALATION_SPRAY | Freq: Four times a day (QID) | RESPIRATORY_TRACT | Status: DC | PRN

## 2022-04-07 MED ORDER — GABAPENTIN 300 MG PO CAPS
300.0000 mg | ORAL_CAPSULE | Freq: Every day | ORAL | Status: DC
  Administered 2022-04-07: 300 mg via ORAL
  Filled 2022-04-07: qty 1

## 2022-04-07 MED ORDER — METOPROLOL SUCCINATE ER 25 MG PO TB24
12.5000 mg | ORAL_TABLET | Freq: Every day | ORAL | Status: DC
  Administered 2022-04-08 – 2022-04-14 (×7): 12.5 mg via ORAL
  Filled 2022-04-07 (×8): qty 1

## 2022-04-07 MED ORDER — SODIUM CHLORIDE 0.9% FLUSH
3.0000 mL | Freq: Two times a day (BID) | INTRAVENOUS | Status: DC
  Administered 2022-04-07 – 2022-04-13 (×12): 3 mL via INTRAVENOUS

## 2022-04-07 MED ORDER — DORZOLAMIDE HCL 2 % OP SOLN
1.0000 [drp] | Freq: Two times a day (BID) | OPHTHALMIC | Status: DC
  Administered 2022-04-07 – 2022-04-14 (×14): 1 [drp] via OPHTHALMIC
  Filled 2022-04-07: qty 10

## 2022-04-07 MED ORDER — IOHEXOL 350 MG/ML SOLN
100.0000 mL | Freq: Once | INTRAVENOUS | Status: AC | PRN
  Administered 2022-04-07: 100 mL via INTRAVENOUS

## 2022-04-07 MED ORDER — ACETAMINOPHEN 325 MG PO TABS
650.0000 mg | ORAL_TABLET | Freq: Four times a day (QID) | ORAL | Status: DC | PRN
  Administered 2022-04-07: 650 mg via ORAL
  Filled 2022-04-07: qty 2

## 2022-04-07 MED ORDER — LATANOPROST 0.005 % OP SOLN
1.0000 [drp] | Freq: Every day | OPHTHALMIC | Status: DC
  Administered 2022-04-07 – 2022-04-13 (×7): 1 [drp] via OPHTHALMIC
  Filled 2022-04-07: qty 2.5

## 2022-04-07 MED ORDER — IOHEXOL 350 MG/ML SOLN: 75.0000 mL | Freq: Once | INTRAVENOUS | Status: DC | PRN

## 2022-04-07 MED ORDER — SODIUM CHLORIDE (PF) 0.9 % IJ SOLN
INTRAMUSCULAR | Status: AC
  Filled 2022-04-07: qty 50

## 2022-04-07 MED ORDER — FENTANYL CITRATE PF 50 MCG/ML IJ SOSY
50.0000 ug | PREFILLED_SYRINGE | Freq: Once | INTRAMUSCULAR | Status: AC
  Administered 2022-04-07: 50 ug via INTRAVENOUS
  Filled 2022-04-07: qty 1

## 2022-04-07 MED ORDER — ALBUTEROL SULFATE (2.5 MG/3ML) 0.083% IN NEBU: 2.5000 mg | INHALATION_SOLUTION | Freq: Four times a day (QID) | RESPIRATORY_TRACT | Status: DC | PRN

## 2022-04-07 MED ORDER — SODIUM CHLORIDE 0.9 % IV BOLUS
1000.0000 mL | Freq: Once | INTRAVENOUS | Status: AC
  Administered 2022-04-07: 1000 mL via INTRAVENOUS

## 2022-04-07 MED ORDER — INSULIN ASPART 100 UNIT/ML IJ SOLN
0.0000 [IU] | Freq: Three times a day (TID) | INTRAMUSCULAR | Status: DC
  Administered 2022-04-08 – 2022-04-09 (×3): 2 [IU] via SUBCUTANEOUS
  Administered 2022-04-09 – 2022-04-11 (×5): 1 [IU] via SUBCUTANEOUS
  Administered 2022-04-12: 5 [IU] via SUBCUTANEOUS
  Administered 2022-04-13 (×2): 1 [IU] via SUBCUTANEOUS
  Administered 2022-04-14: 2 [IU] via SUBCUTANEOUS
  Administered 2022-04-14: 1 [IU] via SUBCUTANEOUS
  Filled 2022-04-07: qty 0.09

## 2022-04-07 MED ORDER — PANTOPRAZOLE SODIUM 40 MG PO TBEC
40.0000 mg | DELAYED_RELEASE_TABLET | Freq: Every day | ORAL | Status: DC
  Administered 2022-04-08 – 2022-04-14 (×6): 40 mg via ORAL
  Filled 2022-04-07 (×6): qty 1

## 2022-04-07 MED ORDER — PIPERACILLIN-TAZOBACTAM 3.375 G IVPB 30 MIN
3.3750 g | Freq: Once | INTRAVENOUS | Status: AC
  Administered 2022-04-07: 3.375 g via INTRAVENOUS
  Filled 2022-04-07: qty 50

## 2022-04-07 MED ORDER — BUSPIRONE HCL 5 MG PO TABS
10.0000 mg | ORAL_TABLET | Freq: Two times a day (BID) | ORAL | Status: DC
  Administered 2022-04-07 – 2022-04-14 (×13): 10 mg via ORAL
  Filled 2022-04-07 (×13): qty 2

## 2022-04-07 MED ORDER — METOPROLOL TARTRATE 5 MG/5ML IV SOLN
2.5000 mg | Freq: Once | INTRAVENOUS | Status: AC
  Administered 2022-04-07: 2.5 mg via INTRAVENOUS
  Filled 2022-04-07: qty 5

## 2022-04-07 MED ORDER — BRIMONIDINE TARTRATE 0.2 % OP SOLN
1.0000 [drp] | Freq: Two times a day (BID) | OPHTHALMIC | Status: DC
  Administered 2022-04-07 – 2022-04-14 (×14): 1 [drp] via OPHTHALMIC
  Filled 2022-04-07: qty 5

## 2022-04-07 MED ORDER — POLYETHYLENE GLYCOL 3350 17 G PO PACK: 17.0000 g | PACK | Freq: Every day | ORAL | Status: DC | PRN

## 2022-04-07 NOTE — ED Provider Notes (Signed)
[facility]-EMERGENCY DEPT Provider Note   CSN: [id number] Arrival date & time: 04/07/22  1032     History  Chief Complaint  Patient presents with   urine infection    Jill Crane is a 81 y.o. female.  HPI 81 year old female presents with concern of infection in her right upper quadrant drain.  She originally had the drain placed back in July and then it fell out and was replaced in 9/14.  She states that since yesterday she has been having pain in that area as well as pleuritic chest pain.  No fevers.  There is a foul smell according to patient and daughter.  She noticed dark output out of the drain this morning though now it seems to be more of a greenish color.  Pain is currently severe.  Home Medications Prior to Admission medications   Medication Sig Start Date End Date Taking? Authorizing Provider  acetaminophen (TYLENOL) 500 MG tablet Take 500 mg by mouth every 6 (six) hours as needed for mild pain.    [provider]  albuterol (VENTOLIN HFA) 108 (90 Base) MCG/ACT inhaler Inhale 2 puffs into the lungs every 6 (six) hours as needed for wheezing or shortness of breath. 02/02/22   Almon Hercules, MD  alendronate (FOSAMAX) 35 MG tablet Take 35 mg by mouth every Monday. Take with a full glass of water on an empty stomach.    [provider]  bimatoprost (LUMIGAN) 0.03 % ophthalmic solution Place 1 drop into both eyes at bedtime.    [provider]  brimonidine (ALPHAGAN) 0.2 % ophthalmic solution Place 1 drop into the left eye in the morning and at bedtime. 10/21/19   [provider]  busPIRone (BUSPAR) 10 MG tablet Take 10 mg by mouth 2 (two) times daily. 08/04/20   [provider]  calcium-vitamin D (OSCAL WITH D) 500-200 MG-UNIT per tablet Take 1 tablet by mouth.    [provider]  dorzolamide (TRUSOPT) 2 % ophthalmic solution Place 1 drop into the left eye 2 (two) times daily. 08/03/20   [provider]  enoxaparin (LOVENOX) 80 MG/0.8ML injection Inject 0.7 mLs (70 mg total) into the skin every 12 (twelve) hours for 5 days. 03/30/22 04/04/22  Kathlen Mody, MD  fluocinonide ointment (LIDEX) 0.05 % Apply 1 Application topically 2 (two) times daily as needed (scalp irritation). 12/27/21   [provider]  fluticasone (FLONASE) 50 MCG/ACT nasal spray Place 2 sprays into both nostrils daily as needed for allergies. 09/27/21   [provider]  furosemide (LASIX) 20 MG tablet Take 1 tablet (20 mg total) by mouth daily. 01/28/21   Hollice Espy, MD  gabapentin (NEURONTIN) 300 MG capsule Take 300 mg by mouth at bedtime. 08/10/20   [provider]  magnesium oxide (MAG-OX) 400 (241.3 Mg) MG tablet Take 400 mg by mouth in the morning and at bedtime. 07/08/20   [provider]  metFORMIN (GLUCOPHAGE) 500 MG tablet Take 500 mg by mouth 2 (two) times daily. 08/24/20   [provider]  metoprolol succinate (TOPROL-XL) 25 MG 24 hr tablet Take 12.5 mg by mouth daily.    [provider]  omeprazole (PRILOSEC) 20 MG capsule Take 20 mg by mouth daily.    [provider]  predniSONE (DELTASONE) 2.5 MG tablet Take 7.5 mg by mouth daily.    [provider]  silver sulfADIAZINE (SILVADENE) 1 % cream Apply 1 Application topically daily as needed (scalp irritation).  [provider]  sodium chloride flush (NS) 0.9 % SOLN Inject 5 mLs by Intracatheter route daily. 02/01/22   Han, Aimee H, PA-C  warfarin (COUMADIN) 2 MG tablet On Saturday and Sunday, take 2 tabs (4mg ), then resume normal regimen Take 1 tablet (2 mg) daily except Take 2 tablets (4 mg) on (Tues & Fri) Patient taking differently: Take 2-4 mg by mouth as directed. Take 2 tablets (4 mg) on (Sun, Mon, Wed, Fri) & Take 1 tablet (2 mg) on [address], [place], [place]) 01/28/21   Annita Brod, MD      Allergies    Patient has no known allergies.    Review of Systems   Review of Systems   Constitutional:  Negative for fever.  Cardiovascular:  Positive for chest pain.  Gastrointestinal:  Positive for abdominal pain.    Physical Exam Updated Vital Signs BP 121/82   Pulse 74   Temp 98.4 F (36.9 C) (Oral)   Resp (!) 31   Ht 5\' 2"  (1.575 m)   Wt 68 kg   SpO2 100%   BMI 27.44 kg/m  Physical Exam Vitals and nursing note reviewed.  Constitutional:      Appearance: She is well-developed. She is not ill-appearing or diaphoretic.  HENT:     Head: Normocephalic and atraumatic.  Cardiovascular:     Rate and Rhythm: Normal rate and regular rhythm.     Heart sounds: Normal heart sounds.  Pulmonary:     Effort: Pulmonary effort is normal.     Breath sounds: Normal breath sounds.  Abdominal:     Palpations: Abdomen is soft.     Tenderness: There is abdominal tenderness in the right upper quadrant, epigastric area and left upper quadrant.    Skin:    General: Skin is warm and dry.  Neurological:     Mental Status: She is alert.     ED Results / Procedures / Treatments   Labs (all labs ordered are listed, but only abnormal results are displayed) Labs Reviewed  COMPREHENSIVE METABOLIC PANEL - Abnormal; Notable for the following components:      Result Value   Glucose, Bld 147 (*)    Albumin 3.1 (*)    All other components within normal limits  CBC WITH DIFFERENTIAL/PLATELET - Abnormal; Notable for the following components:   WBC 15.1 (*)    RDW 18.6 (*)    Neutro Abs 12.5 (*)    Monocytes Absolute 1.1 (*)    Abs Immature Granulocytes 0.09 (*)    All other components within normal limits  PROTIME-INR - Abnormal; Notable for the following components:   Prothrombin Time 29.5 (*)    INR 2.8 (*)    All other components within normal limits  URINALYSIS, ROUTINE W REFLEX MICROSCOPIC  LIPASE, BLOOD  TROPONIN I (HIGH SENSITIVITY)  TROPONIN I (HIGH SENSITIVITY)    EKG EKG Interpretation  Date/Time:  Friday April 07 2022 12:17:48 EDT Ventricular Rate:   135 PR Interval:    QRS Duration: 81 QT Interval:  310 QTC Calculation: 465 R Axis:   -30 Text Interpretation: Atrial fibrillation Left axis deviation Repolarization abnormality, prob rate related similar to Sept 7 2023 Confirmed by Sherwood Gambler [phone]) on 04/07/2022 2:02:11 PM  Radiology DG Chest Portable 1 View  Result Date: 04/07/2022 CLINICAL DATA:  Chest pain EXAM: PORTABLE CHEST 1 VIEW COMPARISON:  Previous studies including the examination of 02/10/2022 FINDINGS: Transverse diameter of heart is increased. There are no signs of pulmonary edema. There  is increased density in the lateral aspect of left lower lung field. Left lateral CP angle is indistinct. Right lateral CP angle is clear. There is no pneumothorax. IMPRESSION: Cardiomegaly. Increased density in the lateral aspect of left lower lung field may be an artifact due to chest wall attenuation or suggest atelectasis/pneumonia and pleural effusion. Follow-up PA and lateral views of chest along with CT if warranted may be considered. Electronically Signed   By: Ernie Avena M.D.   On: 04/07/2022 13:02    Procedures Procedures    Medications Ordered in ED Medications  fentaNYL (SUBLIMAZE) injection 50 mcg (50 mcg Intravenous Given 04/07/22 1238)  sodium chloride 0.9 % bolus 1,000 mL (1,000 mLs Intravenous New Bag/Given 04/07/22 1237)    ED Course/ Medical Decision Making/ A&P                           Medical Decision Making Amount and/or Complexity of Data Reviewed Labs: ordered.    Details: Leukocytosis of 15.  Troponin is normal.  INR is therapeutic at 2.8. Radiology: ordered and independent interpretation performed.    Details: Questionable pneumonia ECG/medicine tests: independent interpretation performed.    Details: Atrial fibrillation, unchanged from baseline.  Risk Prescription drug management.   Patient seems to have at least a skin infection near the insertion site.  CT will be obtained to rule out  deeper infection.  She was given IV fentanyl for pain.  Given her pleuritic chest pain a CT will be obtained to rule out PE.  Otherwise seems hemodynamically stable.  Care transferred to Dr. Adela Lank.        Final Clinical Impression(s) / ED Diagnoses Final diagnoses:  None    Rx / DC Orders ED Discharge Orders     None         Pricilla Loveless, MD 04/07/22 1549

## 2022-04-07 NOTE — Progress Notes (Signed)
Daylene Posey, RN called from [place] and provided a contact a phone number if needed for additional information regarding the patient [phone]

## 2022-04-07 NOTE — ED Triage Notes (Signed)
Patient has a drainage tube on the right and is draining blood tinged urine and dark. Patienat states the drainage has a foul odor.

## 2022-04-07 NOTE — Progress Notes (Signed)
Pharmacy Antibiotic Note  Jill Crane is a 81 y.o. female admitted on 04/07/2022 with  IAI .  Pharmacy has been consulted for Zosyn dosing.  Plan: Zosyn 3.375g IV q8h (4 hour infusion).  Height: 5\' 2"  (157.5 cm) Weight: 68 kg (150 lb) IBW/kg (Calculated) : 50.1  Temp (24hrs), Avg:99.4 F (37.4 C), Min:98.4 F (36.9 C), Max:101.4 F (38.6 C)  Recent Labs  Lab 04/07/22 1228  WBC 15.1*  CREATININE 0.93    Estimated Creatinine Clearance: 43.6 mL/min (by C-G formula based on SCr of 0.93 mg/dL).    No Known Allergies   Dosage will likely remain stable at above dosage and need for further dosage adjustment appears unlikely at present.    Will sign off at this time.  Please reconsult if a change in clinical status warrants re-evaluation of dosage.    Thank you for allowing pharmacy to be a part of this patient's care.  Royetta Asal, PharmD, BCPS 04/07/2022 8:45 PM

## 2022-04-07 NOTE — Progress Notes (Signed)
ANTICOAGULATION CONSULT NOTE   Pharmacy Consult for warfarin Indication: atrial fibrillation  No Known Allergies  Patient Measurements: Height: 5\' 2"  (157.5 cm) Weight: 68 kg (150 lb) IBW/kg (Calculated) : 50.1 Heparin Dosing Weight:   Vital Signs: Temp: 101.4 F (38.6 C) (09/22 1900) Temp Source: Oral (09/22 1900) BP: 138/73 (09/22 2000) Pulse Rate: 58 (09/22 2000)  Labs: Recent Labs    04/07/22 1228 04/07/22 1405  HGB 12.5  --   HCT 39.0  --   PLT 330  --   LABPROT 29.5*  --   INR 2.8*  --   CREATININE 0.93  --   TROPONINIHS 7 7    Estimated Creatinine Clearance: 43.6 mL/min (by C-G formula based on SCr of 0.93 mg/dL).   PTA Medication Dose:  Warfarin 2 mg daily except 4 mg on Tuesdays and Thursdays   Assessment: Pharmacy is consulted to dose warfarin in 81 yo female with PMH of atrial fibrillation. Pt admitted with IAI. LD per med rec is 04/06/22  Today, 04/07/22  INR 2.8, therapeutic Hgb 12.5, Plt 330     Goal of Therapy:  INR 2-3 Monitor platelets by anticoagulation protocol: Yes   Plan:  Warfarin 2 mg PO x 1  Daily INR  Monitor for signs and symptoms of bleeding   Royetta Asal, PharmD, BCPS 04/07/2022 9:01 PM

## 2022-04-07 NOTE — ED Provider Notes (Signed)
81 yo F with a chief complaints of abdominal pain.  This is mostly around the sides of her percutaneous bili tube.  She felt like the drainage had changed color and around the tube there was some pain and drainage.  She came here for evaluation.  No fevers.  Has been able to eat and drink.  I received the patient in signout from Dr. Verner Chol.  Awaiting CT imaging.  CT is concerning for ongoing inflammation around the gallbladder.  I discussed the case with Dr. Donne Hazel.  Recommended IV antibiotics and hospitalist admission.   Deno Etienne, DO 04/07/22 1820

## 2022-04-07 NOTE — H&P (Signed)
History and Physical   Jill Crane:[id number] DOB: July 17, 1941 DOA: 04/07/2022  PCP: Zoila Shutter, MD   Patient coming from: Home  Chief Complaint: Abdominal pain  HPI: Jill Crane is a 81 y.o. female with medical history significant of diabetes, hypertension, diastolic CHF, GERD, atrial fibrillation, GI bleed, cholecystitis presenting with abdominal pain.  She reports pain today at the site of her cholecystostomy tube.  This was originally placed in July due to recurrent cholecystitis and unfortunately had fallen out recently and was replaced a week ago.  She does report some associated pleuritic chest pain and family reports foul smell to the output of her cholecystostomy tube as well as some darker drainage this morning but has returned to green now.  Denies fevers, chills, constipation, diarrhea, nausea, vomiting.  ED Course: Vital signs in ED significant for tachycardia in the 100s to 130s, blood pressure in the 100s to 140s systolic, respiratory rate in the 20s to 30s.  Lab work-up included CMP with glucose 147, abdomen 3.1.  CBC with leukocytosis to 15.1.  PT and INR elevated at 29.5 and 2.8 respectively.  Troponin negative x2.  Lipase normal.  Urinalysis normal.  Chest x-ray showed cardiomegaly with left lower lobe artifact versus atelectasis versus pneumonia.  CT PE study and CT of the abdomen pelvis showed percutaneous cholecystostomy tube in place at the gallbladder with gallbladder wall thickening and surrounding inflammation concerning for recurrent cholecystitis.  Small focus of intraperitoneal free air was noted indeterminant etiology as this could be related to recent catheter placement versus microperforation, mildly dilated central small bowel noted possibly representing element ileus.  No evidence of pulmonary embolus.  Cardiomegaly was noted and a trace right pleural effusion as well.  Patient received Zosyn, fentanyl, IV metoprolol, a liter of fluids in the ED.   General surgery was consulted and agree with antibiotics and admission state unclear if her inflammatory changes represent acute on chronic cholecystitis versus more chronic changes.  Review of Systems: As per HPI otherwise all other systems reviewed and are negative.  Past Medical History:  Diagnosis Date   Atrial fibrillation with normal ventricular rate (HCC)    CHF (congestive heart failure) (HCC)    COPD (chronic obstructive pulmonary disease) (HCC)    Diabetes mellitus without complication (HCC)    Hx of blood clots    Hypertension    Sepsis (HCC) 11/10/2019   SIRS (systemic inflammatory response syndrome) (HCC) 08/15/2020    Past Surgical History:  Procedure Laterality Date   ENDOSCOPIC RETROGRADE CHOLANGIOPANCREATOGRAPHY (ERCP) WITH PROPOFOL N/A 01/22/2021   Procedure: ENDOSCOPIC RETROGRADE CHOLANGIOPANCREATOGRAPHY (ERCP) WITH PROPOFOL;  Surgeon: Hilarie Fredrickson, MD;  Location: [facility] ENDOSCOPY;  Service: Endoscopy;  Laterality: N/A;   ESOPHAGOGASTRODUODENOSCOPY (EGD) WITH PROPOFOL N/A 01/28/2021   Procedure: ESOPHAGOGASTRODUODENOSCOPY (EGD) WITH PROPOFOL;  Surgeon: Iva Boop, MD;  Location: [facility] ENDOSCOPY;  Service: Endoscopy;  Laterality: N/A;   IR PERC CHOLECYSTOSTOMY  01/30/2022   IR PERC CHOLECYSTOSTOMY  03/28/2022   REMOVAL OF STONES  01/22/2021   Procedure: REMOVAL OF STONES;  Surgeon: Hilarie Fredrickson, MD;  Location: [facility] ENDOSCOPY;  Service: Endoscopy;;   SPHINCTEROTOMY  01/22/2021   Procedure: Dennison Mascot;  Surgeon: Hilarie Fredrickson, MD;  Location: [facility] ENDOSCOPY;  Service: Endoscopy;;    Social History  reports that she has quit smoking. Her smoking use included cigarettes. She has never used smokeless tobacco. She reports that she does not drink alcohol and does not use drugs.  No Known Allergies  Family  History  Problem Relation Age of Onset   Stroke Mother    Diabetes Mother    Stroke Father   Reviewed on admission  Prior to Admission medications   Medication Sig Start  Date End Date Taking? Authorizing Provider  acetaminophen (TYLENOL) 500 MG tablet Take 500 mg by mouth every 6 (six) hours as needed for mild pain.    [provider]  albuterol (VENTOLIN HFA) 108 (90 Base) MCG/ACT inhaler Inhale 2 puffs into the lungs every 6 (six) hours as needed for wheezing or shortness of breath. 02/02/22   Almon Hercules, MD  alendronate (FOSAMAX) 35 MG tablet Take 35 mg by mouth every Monday. Take with a full glass of water on an empty stomach.    [provider]  bimatoprost (LUMIGAN) 0.03 % ophthalmic solution Place 1 drop into both eyes at bedtime.    [provider]  brimonidine (ALPHAGAN) 0.2 % ophthalmic solution Place 1 drop into the left eye in the morning and at bedtime. 10/21/19   [provider]  busPIRone (BUSPAR) 10 MG tablet Take 10 mg by mouth 2 (two) times daily. 08/04/20   [provider]  calcium-vitamin D (OSCAL WITH D) 500-200 MG-UNIT per tablet Take 1 tablet by mouth.    [provider]  dorzolamide (TRUSOPT) 2 % ophthalmic solution Place 1 drop into the left eye 2 (two) times daily. 08/03/20   [provider]  enoxaparin (LOVENOX) 80 MG/0.8ML injection Inject 0.7 mLs (70 mg total) into the skin every 12 (twelve) hours for 5 days. 03/30/22 04/04/22  Kathlen Mody, MD  fluocinonide ointment (LIDEX) 0.05 % Apply 1 Application topically 2 (two) times daily as needed (scalp irritation). 12/27/21   [provider]  fluticasone (FLONASE) 50 MCG/ACT nasal spray Place 2 sprays into both nostrils daily as needed for allergies. 09/27/21   [provider]  furosemide (LASIX) 20 MG tablet Take 1 tablet (20 mg total) by mouth daily. 01/28/21   Hollice Espy, MD  gabapentin (NEURONTIN) 300 MG capsule Take 300 mg by mouth at bedtime. 08/10/20   [provider]  magnesium oxide (MAG-OX) 400 (241.3 Mg) MG tablet Take 400 mg by mouth in the morning and at bedtime. 07/08/20   [provider]  metFORMIN (GLUCOPHAGE) 500 MG tablet Take 500 mg by mouth 2 (two) times daily. 08/24/20   [provider]  metoprolol succinate (TOPROL-XL) 25 MG 24 hr tablet Take 12.5 mg by mouth daily.    [provider]  omeprazole (PRILOSEC) 20 MG capsule Take 20 mg by mouth daily.    [provider]  predniSONE (DELTASONE) 2.5 MG tablet Take 7.5 mg by mouth daily.    [provider]  silver sulfADIAZINE (SILVADENE) 1 % cream Apply 1 Application topically daily as needed (scalp irritation).    [provider]  sodium chloride flush (NS) 0.9 % SOLN Inject 5 mLs by Intracatheter route daily. 02/01/22   Han, Aimee H, PA-C  warfarin (COUMADIN) 2 MG tablet On Saturday and Sunday, take 2 tabs (4mg ), then resume normal regimen Take 1 tablet (2 mg) daily except Take 2 tablets (4 mg) on (Tues & Fri) Patient taking differently: Take 2-4 mg by mouth as directed. Take 2 tablets (4 mg) on (Sun, Mon, Wed, Fri) & Take 1 tablet (2 mg) on 07-04-1998, [place], [place]) 01/28/21   01/30/21, MD    Physical Exam: Vitals:   04/07/22 1847 04/07/22 1900 04/07/22 1930 04/07/22 2000  BP: (!) 132/56 139/67 (!) 144/74 138/73  Pulse: (!) 102   (!) 58  Resp: (!) 34 (!) 37 (!) 25   Temp:  (!) 101.4 F (38.6 C)    TempSrc:  Oral    SpO2: 95%   93%  Weight:      Height:        Physical Exam Constitutional:      General: She is not in acute distress.    Appearance: Normal appearance.  HENT:     Head: Normocephalic and atraumatic.     Mouth/Throat:     Mouth: Mucous membranes are moist.     Pharynx: Oropharynx is clear.  Eyes:     Extraocular Movements: Extraocular movements intact.     Pupils: Pupils are equal, round, and reactive to light.  Cardiovascular:     Rate and Rhythm: Tachycardia present. Rhythm irregular.     Pulses: Normal pulses.     Heart sounds: Normal heart sounds.  Pulmonary:     Effort: Pulmonary effort is normal. No respiratory distress.      Breath sounds: Normal breath sounds.  Abdominal:     General: Bowel sounds are normal. There is no distension.     Palpations: Abdomen is soft.     Tenderness: There is abdominal tenderness.  Musculoskeletal:        General: No swelling or deformity.  Skin:    General: Skin is warm and dry.  Neurological:     General: No focal deficit present.     Mental Status: Mental status is at baseline.    Labs on Admission: I have personally reviewed following labs and imaging studies  CBC: Recent Labs  Lab 04/07/22 1228  WBC 15.1*  NEUTROABS 12.5*  HGB 12.5  HCT 39.0  MCV 85.2  PLT 330    Basic Metabolic Panel: Recent Labs  Lab 04/07/22 1228  NA 141  K 4.9  CL 110  CO2 23  GLUCOSE 147*  BUN 15  CREATININE 0.93  CALCIUM 9.3    GFR: Estimated Creatinine Clearance: 43.6 mL/min (by C-G formula based on SCr of 0.93 mg/dL).  Liver Function Tests: Recent Labs  Lab 04/07/22 1228  AST 26  ALT 17  ALKPHOS 55  BILITOT 1.1  PROT 7.6  ALBUMIN 3.1*    Urine analysis:    Component Value Date/Time   COLORURINE YELLOW 04/07/2022 1429   APPEARANCEUR CLEAR 04/07/2022 1429   LABSPEC 1.013 04/07/2022 1429   PHURINE 7.0 04/07/2022 1429   GLUCOSEU NEGATIVE 04/07/2022 1429   HGBUR NEGATIVE 04/07/2022 1429   BILIRUBINUR NEGATIVE 04/07/2022 1429   KETONESUR NEGATIVE 04/07/2022 1429   PROTEINUR NEGATIVE 04/07/2022 1429   UROBILINOGEN 1.0 08/16/2014 0423   NITRITE NEGATIVE 04/07/2022 1429   LEUKOCYTESUR NEGATIVE 04/07/2022 1429    Radiological Exams on Admission: CT Angio Chest PE W and/or Wo Contrast  Result Date: 04/07/2022 CLINICAL DATA:  High probability for PE. Infection in the right upper quadrant. Drain placed in July. EXAM: CT ANGIOGRAPHY CHEST CT ABDOMEN AND PELVIS WITH CONTRAST TECHNIQUE: Multidetector CT imaging of the chest was performed using the standard protocol during bolus administration of intravenous contrast. Multiplanar CT image reconstructions and MIPs  were obtained to evaluate the vascular anatomy. Multidetector CT imaging of the abdomen and pelvis was performed using the standard protocol during bolus administration of intravenous contrast. RADIATION DOSE REDUCTION: This exam was performed according to the departmental dose-optimization program which includes automated exposure control, adjustment of the mA and/or kV according  to patient size and/or use of iterative reconstruction technique. CONTRAST:  OMNIPAQUE IOHEXOL 350 MG/ML SOLN COMPARISON:  CT abdomen and pelvis 03/23/2022. CT angiogram chest 01/20/2021. FINDINGS: CTA CHEST FINDINGS Cardiovascular: Heart is mildly enlarged. Aorta is normal in size. There are atherosclerotic calcifications of the aorta and coronary arteries. There is no pericardial effusion. There is adequate opacification of the pulmonary arteries to the segmental level. There is no evidence for pulmonary embolism. Mediastinum/Nodes: No enlarged mediastinal, hilar, or axillary lymph nodes. Thyroid gland, trachea, and esophagus demonstrate no significant findings. Lungs/Pleura: There is minimal atelectasis in the lung bases. Lungs are otherwise clear. There is a trace right pleural effusion. The lungs are otherwise clear. No pneumothorax. Musculoskeletal: There is scoliosis of the thoracic spine with multilevel degenerative change similar to prior. Review of the MIP images confirms the above findings. CT ABDOMEN and PELVIS FINDINGS Hepatobiliary: Percutaneous cholecystostomy tube is present with distal tip coiled within the gallbladder. There is gallbladder wall thickening and surrounding inflammatory stranding. There is no evidence for adjacent focal fluid collection or adjacent air. Pneumobilia in the liver and common bile duct are present compatible with cholecystostomy tube. The liver otherwise appears within normal limits. Pancreas: Unremarkable. No pancreatic ductal dilatation or surrounding inflammatory changes. Spleen: Normal  in size without focal abnormality. Adrenals/Urinary Tract: There is a 2 cm cyst in the left kidney. There is no hydronephrosis or perinephric fat stranding. There is some scarring in the superior pole the right kidney, unchanged. The right kidney is slightly malrotated, also unchanged. The adrenal glands and bladder are within normal limits. Stomach/Bowel: Stomach is within normal limits. Appendix appears normal. There is no focal bowel wall thickening or inflammation. Colonic diverticula are present. There are some mildly dilated central small bowel loops with air-fluid levels measuring up to 3.5 cm in the central abdomen. No significant mesenteric edema present. Vascular/Lymphatic: There is severe atherosclerotic calcifications throughout the abdomen and pelvis. Aorta is normal in size. Infrarenal IVC filter is present. Reproductive: Uterus and bilateral adnexa are unremarkable. Other: Single small focus of free air with mild surrounding inflammation seen in the anterior upper abdomen image 6/29. This is anterior to the transverse colon. Focal abdominal wall hernia. There is a small amount of intramuscular abdominal wall edema where the catheter is placed. Small amount of subcutaneous air seen in the anterior left abdomen which may represent medication injection site. No ascites. Musculoskeletal: No acute fractures. Review of the MIP images confirms the above findings. IMPRESSION: 1. Percutaneous cholecystostomy tube in place in the gallbladder. There is gallbladder wall thickening with surrounding inflammation worrisome for cholecystitis. No adjacent fluid collection. 2. Single small focus of free intraperitoneal air anterior to the transverse colon. This is indeterminate and may be related to recent catheter placement. Bowel micro perforation is not excluded in the appropriate clinical setting, although there is no focal bowel wall thickening or inflammation. 3. Mildly dilated central small bowel loops may  represent ileus or partial small bowel obstruction. Continued follow-up recommended. 4. No evidence for pulmonary embolism. 5. Trace right pleural effusion. 6. Mild cardiomegaly. 7. Aortic Atherosclerosis (ICD10-I70.0). 8. Left renal cysts.  No follow-up imaging necessary. Electronically Signed   By: Darliss Cheney M.D.   On: 04/07/2022 16:46   CT ABDOMEN PELVIS W CONTRAST  Result Date: 04/07/2022 CLINICAL DATA:  High probability for PE. Infection in the right upper quadrant. Drain placed in July. EXAM: CT ANGIOGRAPHY CHEST CT ABDOMEN AND PELVIS WITH CONTRAST TECHNIQUE: Multidetector CT imaging of the chest  was performed using the standard protocol during bolus administration of intravenous contrast. Multiplanar CT image reconstructions and MIPs were obtained to evaluate the vascular anatomy. Multidetector CT imaging of the abdomen and pelvis was performed using the standard protocol during bolus administration of intravenous contrast. RADIATION DOSE REDUCTION: This exam was performed according to the departmental dose-optimization program which includes automated exposure control, adjustment of the mA and/or kV according to patient size and/or use of iterative reconstruction technique. CONTRAST:  OMNIPAQUE IOHEXOL 350 MG/ML SOLN COMPARISON:  CT abdomen and pelvis 03/23/2022. CT angiogram chest 01/20/2021. FINDINGS: CTA CHEST FINDINGS Cardiovascular: Heart is mildly enlarged. Aorta is normal in size. There are atherosclerotic calcifications of the aorta and coronary arteries. There is no pericardial effusion. There is adequate opacification of the pulmonary arteries to the segmental level. There is no evidence for pulmonary embolism. Mediastinum/Nodes: No enlarged mediastinal, hilar, or axillary lymph nodes. Thyroid gland, trachea, and esophagus demonstrate no significant findings. Lungs/Pleura: There is minimal atelectasis in the lung bases. Lungs are otherwise clear. There is a trace right pleural  effusion. The lungs are otherwise clear. No pneumothorax. Musculoskeletal: There is scoliosis of the thoracic spine with multilevel degenerative change similar to prior. Review of the MIP images confirms the above findings. CT ABDOMEN and PELVIS FINDINGS Hepatobiliary: Percutaneous cholecystostomy tube is present with distal tip coiled within the gallbladder. There is gallbladder wall thickening and surrounding inflammatory stranding. There is no evidence for adjacent focal fluid collection or adjacent air. Pneumobilia in the liver and common bile duct are present compatible with cholecystostomy tube. The liver otherwise appears within normal limits. Pancreas: Unremarkable. No pancreatic ductal dilatation or surrounding inflammatory changes. Spleen: Normal in size without focal abnormality. Adrenals/Urinary Tract: There is a 2 cm cyst in the left kidney. There is no hydronephrosis or perinephric fat stranding. There is some scarring in the superior pole the right kidney, unchanged. The right kidney is slightly malrotated, also unchanged. The adrenal glands and bladder are within normal limits. Stomach/Bowel: Stomach is within normal limits. Appendix appears normal. There is no focal bowel wall thickening or inflammation. Colonic diverticula are present. There are some mildly dilated central small bowel loops with air-fluid levels measuring up to 3.5 cm in the central abdomen. No significant mesenteric edema present. Vascular/Lymphatic: There is severe atherosclerotic calcifications throughout the abdomen and pelvis. Aorta is normal in size. Infrarenal IVC filter is present. Reproductive: Uterus and bilateral adnexa are unremarkable. Other: Single small focus of free air with mild surrounding inflammation seen in the anterior upper abdomen image 6/29. This is anterior to the transverse colon. Focal abdominal wall hernia. There is a small amount of intramuscular abdominal wall edema where the catheter is placed. Small  amount of subcutaneous air seen in the anterior left abdomen which may represent medication injection site. No ascites. Musculoskeletal: No acute fractures. Review of the MIP images confirms the above findings. IMPRESSION: 1. Percutaneous cholecystostomy tube in place in the gallbladder. There is gallbladder wall thickening with surrounding inflammation worrisome for cholecystitis. No adjacent fluid collection. 2. Single small focus of free intraperitoneal air anterior to the transverse colon. This is indeterminate and may be related to recent catheter placement. Bowel micro perforation is not excluded in the appropriate clinical setting, although there is no focal bowel wall thickening or inflammation. 3. Mildly dilated central small bowel loops may represent ileus or partial small bowel obstruction. Continued follow-up recommended. 4. No evidence for pulmonary embolism. 5. Trace right pleural effusion. 6. Mild cardiomegaly. 7. Aortic  Atherosclerosis (ICD10-I70.0). 8. Left renal cysts.  No follow-up imaging necessary. Electronically Signed   By: Ronney Asters M.D.   On: 04/07/2022 16:46   DG Chest Portable 1 View  Result Date: 04/07/2022 CLINICAL DATA:  Chest pain EXAM: PORTABLE CHEST 1 VIEW COMPARISON:  Previous studies including the examination of 02/10/2022 FINDINGS: Transverse diameter of heart is increased. There are no signs of pulmonary edema. There is increased density in the lateral aspect of left lower lung field. Left lateral CP angle is indistinct. Right lateral CP angle is clear. There is no pneumothorax. IMPRESSION: Cardiomegaly. Increased density in the lateral aspect of left lower lung field may be an artifact due to chest wall attenuation or suggest atelectasis/pneumonia and pleural effusion. Follow-up PA and lateral views of chest along with CT if warranted may be considered. Electronically Signed   By: Elmer Picker M.D.   On: 04/07/2022 13:02    EKG: Independently reviewed.  Atrial  flutter with borderline RVR in the 130s.   This rate improved in the ED with IV metoprolol administration and pain control.  Assessment/Plan Principal Problem:   Acute on chronic cholecystitis Active Problems:   Atrial fibrillation (HCC)   Diabetes (HCC)   Essential hypertension   Chronic diastolic CHF (congestive heart failure) (HCC)   GERD (gastroesophageal reflux disease)   History of pemphigus   Acute on chronic cholecystitis > Patient presenting with abdominal pain at the site of her cholecystostomy tube.  This was recently replaced a week ago after it had fallen out.  Originally placed in July. > CT showed cholecystostomy tube in place and gallbladder wall thickening with surrounding inflammatory changes consistent with cholecystitis. > Has some associated pleuritic chest pain as well.  And is noted to have small right pleural effusion all likely reactive to the gallbladder inflammation. > General surgery consulted and states this could be acute on chronic cholecystitis or some of these could be residual inflammatory changes and chronic cholecystitis.  Either way agree with admission and IV antibiotics. - Monitor on telemetry as heart rate has improved - Continue with IV Zosyn - Trend fever curve and WBC - Pain control with Tylenol for mild pain, Norco for moderate to severe pain, Dilaudid for severe breakthrough pain - Hold off on further IV fluids due to diastolic heart failure history - Supportive care  Diabetes - SSI  Hypertension - Continue home metoprolol  Atrial fibrillation > Noted to be in a flutter with heart rate intermittently in the 120s to 130s in the ED, this has improved with IV metoprolol in the ED. - Continue home metoprolol - Warfarin per pharmacy  Diastolic CHF > History of this in chart.  No recent echo that I can see. - Continue home metoprolol - Holding home Lasix for now  Pemphigus - Continue home steroids  GERD - Continue home PPI  DVT  prophylaxis: Warfarin Code Status:   Full Family Communication:  Updated at bedside Disposition Plan:   Patient is from:  Home  Anticipated DC to:  Home  Anticipated DC date:  1 to 3 days  Anticipated DC barriers: None  Consults called:  General surgery, consulted in the ED, unclear if they will be following or just left initial recommendations. Admission status:  Observation, telemetry  Severity of Illness: The appropriate patient status for this patient is OBSERVATION. Observation status is judged to be reasonable and necessary in order to provide the required intensity of service to ensure the patient's safety. The patient's presenting symptoms,  physical exam findings, and initial radiographic and laboratory data in the context of their medical condition is felt to place them at decreased risk for further clinical deterioration. Furthermore, it is anticipated that the patient will be medically stable for discharge from the hospital within 2 midnights of admission.    Synetta Fail MD [facility]  How to contact the [facility] Attending or Consulting provider 7A - 7P or covering provider during after hours 7P -7A, for this patient?   Check the care team in [facility] and look for a) attending/consulting [facility] provider listed and b) the [facility] team listed Log into [email or web address] and use [facility]'s universal password to access. If you do not have the password, please contact the hospital operator. Locate the [facility] provider you are looking for under [facility] and page to a number that you can be directly reached. If you still have difficulty reaching the provider, please page the [facility] (Director on Call) for the Hospitalists listed on amion for assistance.  04/07/2022, 8:32 PM

## 2022-04-08 DIAGNOSIS — I5030 Unspecified diastolic (congestive) heart failure: Secondary | ICD-10-CM | POA: Diagnosis not present

## 2022-04-08 DIAGNOSIS — L109 Pemphigus, unspecified: Secondary | ICD-10-CM | POA: Diagnosis present

## 2022-04-08 DIAGNOSIS — K811 Chronic cholecystitis: Secondary | ICD-10-CM | POA: Diagnosis not present

## 2022-04-08 DIAGNOSIS — Z7984 Long term (current) use of oral hypoglycemic drugs: Secondary | ICD-10-CM | POA: Diagnosis not present

## 2022-04-08 DIAGNOSIS — E1165 Type 2 diabetes mellitus with hyperglycemia: Secondary | ICD-10-CM | POA: Diagnosis not present

## 2022-04-08 DIAGNOSIS — Z87891 Personal history of nicotine dependence: Secondary | ICD-10-CM | POA: Diagnosis not present

## 2022-04-08 DIAGNOSIS — Z86718 Personal history of other venous thrombosis and embolism: Secondary | ICD-10-CM | POA: Diagnosis not present

## 2022-04-08 DIAGNOSIS — Z7901 Long term (current) use of anticoagulants: Secondary | ICD-10-CM | POA: Diagnosis not present

## 2022-04-08 DIAGNOSIS — Z79899 Other long term (current) drug therapy: Secondary | ICD-10-CM | POA: Diagnosis not present

## 2022-04-08 DIAGNOSIS — I5032 Chronic diastolic (congestive) heart failure: Secondary | ICD-10-CM | POA: Diagnosis present

## 2022-04-08 DIAGNOSIS — I495 Sick sinus syndrome: Secondary | ICD-10-CM | POA: Diagnosis present

## 2022-04-08 DIAGNOSIS — Z823 Family history of stroke: Secondary | ICD-10-CM | POA: Diagnosis not present

## 2022-04-08 DIAGNOSIS — I4821 Permanent atrial fibrillation: Secondary | ICD-10-CM | POA: Diagnosis present

## 2022-04-08 DIAGNOSIS — Z7983 Long term (current) use of bisphosphonates: Secondary | ICD-10-CM | POA: Diagnosis not present

## 2022-04-08 DIAGNOSIS — I110 Hypertensive heart disease with heart failure: Secondary | ICD-10-CM | POA: Diagnosis present

## 2022-04-08 DIAGNOSIS — K812 Acute cholecystitis with chronic cholecystitis: Secondary | ICD-10-CM | POA: Diagnosis present

## 2022-04-08 DIAGNOSIS — F419 Anxiety disorder, unspecified: Secondary | ICD-10-CM | POA: Diagnosis present

## 2022-04-08 DIAGNOSIS — I4892 Unspecified atrial flutter: Secondary | ICD-10-CM | POA: Diagnosis present

## 2022-04-08 DIAGNOSIS — B3731 Acute candidiasis of vulva and vagina: Secondary | ICD-10-CM | POA: Diagnosis not present

## 2022-04-08 DIAGNOSIS — J449 Chronic obstructive pulmonary disease, unspecified: Secondary | ICD-10-CM | POA: Diagnosis present

## 2022-04-08 DIAGNOSIS — Z7952 Long term (current) use of systemic steroids: Secondary | ICD-10-CM | POA: Diagnosis not present

## 2022-04-08 DIAGNOSIS — E1151 Type 2 diabetes mellitus with diabetic peripheral angiopathy without gangrene: Secondary | ICD-10-CM | POA: Diagnosis present

## 2022-04-08 DIAGNOSIS — Z7902 Long term (current) use of antithrombotics/antiplatelets: Secondary | ICD-10-CM | POA: Diagnosis not present

## 2022-04-08 DIAGNOSIS — Z833 Family history of diabetes mellitus: Secondary | ICD-10-CM | POA: Diagnosis not present

## 2022-04-08 DIAGNOSIS — I10 Essential (primary) hypertension: Secondary | ICD-10-CM | POA: Diagnosis not present

## 2022-04-08 DIAGNOSIS — K219 Gastro-esophageal reflux disease without esophagitis: Secondary | ICD-10-CM | POA: Diagnosis present

## 2022-04-08 DIAGNOSIS — R109 Unspecified abdominal pain: Secondary | ICD-10-CM | POA: Diagnosis present

## 2022-04-08 LAB — CBC
HCT: 35.8 % — ABNORMAL LOW (ref 36.0–46.0)
Hemoglobin: 11.2 g/dL — ABNORMAL LOW (ref 12.0–15.0)
MCH: 26.7 pg (ref 26.0–34.0)
MCHC: 31.3 g/dL (ref 30.0–36.0)
MCV: 85.4 fL (ref 80.0–100.0)
Platelets: 299 10*3/uL (ref 150–400)
RBC: 4.19 MIL/uL (ref 3.87–5.11)
RDW: 18.6 % — ABNORMAL HIGH (ref 11.5–15.5)
WBC: 12.7 10*3/uL — ABNORMAL HIGH (ref 4.0–10.5)
nRBC: 0 % (ref 0.0–0.2)

## 2022-04-08 LAB — GLUCOSE, CAPILLARY
Glucose-Capillary: 81 mg/dL (ref 70–99)
Glucose-Capillary: 180 mg/dL — ABNORMAL HIGH (ref 70–99)
Glucose-Capillary: 158 mg/dL — ABNORMAL HIGH (ref 70–99)
Glucose-Capillary: 128 mg/dL — ABNORMAL HIGH (ref 70–99)

## 2022-04-08 LAB — PROTIME-INR
INR: 3 — ABNORMAL HIGH (ref 0.8–1.2)
Prothrombin Time: 30.6 seconds — ABNORMAL HIGH (ref 11.4–15.2)

## 2022-04-08 LAB — COMPREHENSIVE METABOLIC PANEL
ALT: 12 U/L (ref 0–44)
AST: 13 U/L — ABNORMAL LOW (ref 15–41)
Albumin: 2.6 g/dL — ABNORMAL LOW (ref 3.5–5.0)
Alkaline Phosphatase: 49 U/L (ref 38–126)
Anion gap: 7 (ref 5–15)
BUN: 12 mg/dL (ref 8–23)
CO2: 22 mmol/L (ref 22–32)
Calcium: 8.8 mg/dL — ABNORMAL LOW (ref 8.9–10.3)
Chloride: 111 mmol/L (ref 98–111)
Creatinine, Ser: 0.88 mg/dL (ref 0.44–1.00)
GFR, Estimated: >60 mL/min (ref >60)
Glucose, Bld: 95 mg/dL (ref 70–99)
Potassium: 4.2 mmol/L (ref 3.5–5.1)
Sodium: 140 mmol/L (ref 135–145)
Total Bilirubin: 1.1 mg/dL (ref 0.3–1.2)
Total Protein: 6.6 g/dL (ref 6.5–8.1)

## 2022-04-08 MED ORDER — GABAPENTIN 300 MG PO CAPS
300.0000 mg | ORAL_CAPSULE | Freq: Two times a day (BID) | ORAL | Status: DC
  Administered 2022-04-08 – 2022-04-14 (×11): 300 mg via ORAL
  Filled 2022-04-08 (×11): qty 1

## 2022-04-08 NOTE — Assessment & Plan Note (Addendum)
-   Patient maintained on PPI during the hospitalization. 

## 2022-04-08 NOTE — Assessment & Plan Note (Addendum)
-   Hemoglobin A1c 6.8 (01/31/2022) -Patient's oral hypoglycemic agents were held during the hospitalization and patient maintained on sliding scale insulin.   -Oral hypoglycemic agents will be resumed on discharge.

## 2022-04-08 NOTE — Assessment & Plan Note (Addendum)
-   Patient noted with acute on chronic cholecystitis.  -Patient seen by general surgery and with no continued improvement early on in the hospitalization and subsequently patient underwent laparoscopic cholecystectomy per general surgery, Dr. Marcello Moores 04/12/2022.   -IV antibiotics of Zosyn were discontinued postoperatively.   -Patient started on clear liquid diet, diet advanced to a carb modified diet which patient tolerated.   -Patient was followed by general surgery and will follow-up with general surgery in the outpatient setting.   -Patient will be discharged in stable and improved condition.

## 2022-04-08 NOTE — Assessment & Plan Note (Addendum)
-   Patient was maintained on home regimen prednisone 7.5 mg daily.

## 2022-04-08 NOTE — Assessment & Plan Note (Addendum)
-  Patient remained euvolemic during the hospitalization.   -Diuretics were held and will be resumed on discharge.   -Follow-up was continued during the hospitalization.

## 2022-04-08 NOTE — Progress Notes (Signed)
Subjective/Chief Complaint: Cc abd pain Pt back with fevers and abd pain despite functional perc chole drain.  ? Pneumonia on plain film, but CTA was negative for infiltrate.   Denies SOB/chest pain.   Objective: Vital signs in last 24 hours: Temp:  [98.2 F (36.8 C)-101.4 F (38.6 C)] 98.4 F (36.9 C) (09/23 [id number]) Pulse Rate:  [58-102] 97 (09/23 0608) Resp:  [17-40] 17 (09/23 0608) BP: (104-145)/(50-97) 111/52 (09/23 0608) SpO2:  [93 %-100 %] 94 % (09/23 0608) Weight:  [68 kg-70.1 kg] 70.1 kg (09/23 0608) Last BM Date : 04/07/22  Intake/Output from previous day: 09/22 0701 - 09/23 0700 In: 1054.8 [IV Piggyback:1049.8] Out: 550 [Urine:425; Drains:125] Intake/Output this shift: No intake/output data recorded.   General appearance: alert, cooperative, and no distress Resp: breathing normally GI: soft, non distended, appropriately tender around per chole tube which is draining bilious/SS effluent (transparent/not cloudy)  Lab Results:  Recent Labs    04/07/22 1228 04/08/22 0624  WBC 15.1* 12.7*  HGB 12.5 11.2*  HCT 39.0 35.8*  PLT 330 299   BMET Recent Labs    04/07/22 1228 04/08/22 0624  NA 141 140  K 4.9 4.2  CL 110 111  CO2 23 22  GLUCOSE 147* 95  BUN 15 12  CREATININE 0.93 0.88  CALCIUM 9.3 8.8*   PT/INR Recent Labs    04/07/22 1228 04/08/22 0624  LABPROT 29.5* 30.6*  INR 2.8* 3.0*   ABG No results for input(s): "PHART", "HCO3" in the last 72 hours.  Invalid input(s): "PCO2", "PO2"  Studies/Results: CT Angio Chest PE W and/or Wo Contrast  Result Date: 04/07/2022 CLINICAL DATA:  High probability for PE. Infection in the right upper quadrant. Drain placed in July. EXAM: CT ANGIOGRAPHY CHEST CT ABDOMEN AND PELVIS WITH CONTRAST TECHNIQUE: Multidetector CT imaging of the chest was performed using the standard protocol during bolus administration of intravenous contrast. Multiplanar CT image reconstructions and MIPs were obtained to evaluate the  vascular anatomy. Multidetector CT imaging of the abdomen and pelvis was performed using the standard protocol during bolus administration of intravenous contrast. RADIATION DOSE REDUCTION: This exam was performed according to the departmental dose-optimization program which includes automated exposure control, adjustment of the mA and/or kV according to patient size and/or use of iterative reconstruction technique. CONTRAST:  125mL OMNIPAQUE IOHEXOL 350 MG/ML SOLN COMPARISON:  CT abdomen and pelvis 03/23/2022. CT angiogram chest 01/20/2021. FINDINGS: CTA CHEST FINDINGS Cardiovascular: Heart is mildly enlarged. Aorta is normal in size. There are atherosclerotic calcifications of the aorta and coronary arteries. There is no pericardial effusion. There is adequate opacification of the pulmonary arteries to the segmental level. There is no evidence for pulmonary embolism. Mediastinum/Nodes: No enlarged mediastinal, hilar, or axillary lymph nodes. Thyroid gland, trachea, and esophagus demonstrate no significant findings. Lungs/Pleura: There is minimal atelectasis in the lung bases. Lungs are otherwise clear. There is a trace right pleural effusion. The lungs are otherwise clear. No pneumothorax. Musculoskeletal: There is scoliosis of the thoracic spine with multilevel degenerative change similar to prior. Review of the MIP images confirms the above findings. CT ABDOMEN and PELVIS FINDINGS Hepatobiliary: Percutaneous cholecystostomy tube is present with distal tip coiled within the gallbladder. There is gallbladder wall thickening and surrounding inflammatory stranding. There is no evidence for adjacent focal fluid collection or adjacent air. Pneumobilia in the liver and common bile duct are present compatible with cholecystostomy tube. The liver otherwise appears within normal limits. Pancreas: Unremarkable. No pancreatic ductal dilatation or surrounding inflammatory changes.  Spleen: Normal in size without focal  abnormality. Adrenals/Urinary Tract: There is a 2 cm cyst in the left kidney. There is no hydronephrosis or perinephric fat stranding. There is some scarring in the superior pole the right kidney, unchanged. The right kidney is slightly malrotated, also unchanged. The adrenal glands and bladder are within normal limits. Stomach/Bowel: Stomach is within normal limits. Appendix appears normal. There is no focal bowel wall thickening or inflammation. Colonic diverticula are present. There are some mildly dilated central small bowel loops with air-fluid levels measuring up to 3.5 cm in the central abdomen. No significant mesenteric edema present. Vascular/Lymphatic: There is severe atherosclerotic calcifications throughout the abdomen and pelvis. Aorta is normal in size. Infrarenal IVC filter is present. Reproductive: Uterus and bilateral adnexa are unremarkable. Other: Single small focus of free air with mild surrounding inflammation seen in the anterior upper abdomen image 6/29. This is anterior to the transverse colon. Focal abdominal wall hernia. There is a small amount of intramuscular abdominal wall edema where the catheter is placed. Small amount of subcutaneous air seen in the anterior left abdomen which may represent medication injection site. No ascites. Musculoskeletal: No acute fractures. Review of the MIP images confirms the above findings. IMPRESSION: 1. Percutaneous cholecystostomy tube in place in the gallbladder. There is gallbladder wall thickening with surrounding inflammation worrisome for cholecystitis. No adjacent fluid collection. 2. Single small focus of free intraperitoneal air anterior to the transverse colon. This is indeterminate and may be related to recent catheter placement. Bowel micro perforation is not excluded in the appropriate clinical setting, although there is no focal bowel wall thickening or inflammation. 3. Mildly dilated central small bowel loops may represent ileus or partial  small bowel obstruction. Continued follow-up recommended. 4. No evidence for pulmonary embolism. 5. Trace right pleural effusion. 6. Mild cardiomegaly. 7. Aortic Atherosclerosis (ICD10-I70.0). 8. Left renal cysts.  No follow-up imaging necessary. Electronically Signed   By: Ronney Asters M.D.   On: 04/07/2022 16:46   CT ABDOMEN PELVIS W CONTRAST  Result Date: 04/07/2022 CLINICAL DATA:  High probability for PE. Infection in the right upper quadrant. Drain placed in July. EXAM: CT ANGIOGRAPHY CHEST CT ABDOMEN AND PELVIS WITH CONTRAST TECHNIQUE: Multidetector CT imaging of the chest was performed using the standard protocol during bolus administration of intravenous contrast. Multiplanar CT image reconstructions and MIPs were obtained to evaluate the vascular anatomy. Multidetector CT imaging of the abdomen and pelvis was performed using the standard protocol during bolus administration of intravenous contrast. RADIATION DOSE REDUCTION: This exam was performed according to the departmental dose-optimization program which includes automated exposure control, adjustment of the mA and/or kV according to patient size and/or use of iterative reconstruction technique. CONTRAST:  126mL OMNIPAQUE IOHEXOL 350 MG/ML SOLN COMPARISON:  CT abdomen and pelvis 03/23/2022. CT angiogram chest 01/20/2021. FINDINGS: CTA CHEST FINDINGS Cardiovascular: Heart is mildly enlarged. Aorta is normal in size. There are atherosclerotic calcifications of the aorta and coronary arteries. There is no pericardial effusion. There is adequate opacification of the pulmonary arteries to the segmental level. There is no evidence for pulmonary embolism. Mediastinum/Nodes: No enlarged mediastinal, hilar, or axillary lymph nodes. Thyroid gland, trachea, and esophagus demonstrate no significant findings. Lungs/Pleura: There is minimal atelectasis in the lung bases. Lungs are otherwise clear. There is a trace right pleural effusion. The lungs are otherwise  clear. No pneumothorax. Musculoskeletal: There is scoliosis of the thoracic spine with multilevel degenerative change similar to prior. Review of the MIP images confirms the above  findings. CT ABDOMEN and PELVIS FINDINGS Hepatobiliary: Percutaneous cholecystostomy tube is present with distal tip coiled within the gallbladder. There is gallbladder wall thickening and surrounding inflammatory stranding. There is no evidence for adjacent focal fluid collection or adjacent air. Pneumobilia in the liver and common bile duct are present compatible with cholecystostomy tube. The liver otherwise appears within normal limits. Pancreas: Unremarkable. No pancreatic ductal dilatation or surrounding inflammatory changes. Spleen: Normal in size without focal abnormality. Adrenals/Urinary Tract: There is a 2 cm cyst in the left kidney. There is no hydronephrosis or perinephric fat stranding. There is some scarring in the superior pole the right kidney, unchanged. The right kidney is slightly malrotated, also unchanged. The adrenal glands and bladder are within normal limits. Stomach/Bowel: Stomach is within normal limits. Appendix appears normal. There is no focal bowel wall thickening or inflammation. Colonic diverticula are present. There are some mildly dilated central small bowel loops with air-fluid levels measuring up to 3.5 cm in the central abdomen. No significant mesenteric edema present. Vascular/Lymphatic: There is severe atherosclerotic calcifications throughout the abdomen and pelvis. Aorta is normal in size. Infrarenal IVC filter is present. Reproductive: Uterus and bilateral adnexa are unremarkable. Other: Single small focus of free air with mild surrounding inflammation seen in the anterior upper abdomen image 6/29. This is anterior to the transverse colon. Focal abdominal wall hernia. There is a small amount of intramuscular abdominal wall edema where the catheter is placed. Small amount of subcutaneous air seen  in the anterior left abdomen which may represent medication injection site. No ascites. Musculoskeletal: No acute fractures. Review of the MIP images confirms the above findings. IMPRESSION: 1. Percutaneous cholecystostomy tube in place in the gallbladder. There is gallbladder wall thickening with surrounding inflammation worrisome for cholecystitis. No adjacent fluid collection. 2. Single small focus of free intraperitoneal air anterior to the transverse colon. This is indeterminate and may be related to recent catheter placement. Bowel micro perforation is not excluded in the appropriate clinical setting, although there is no focal bowel wall thickening or inflammation. 3. Mildly dilated central small bowel loops may represent ileus or partial small bowel obstruction. Continued follow-up recommended. 4. No evidence for pulmonary embolism. 5. Trace right pleural effusion. 6. Mild cardiomegaly. 7. Aortic Atherosclerosis (ICD10-I70.0). 8. Left renal cysts.  No follow-up imaging necessary. Electronically Signed   By: Ronney Asters M.D.   On: 04/07/2022 16:46   DG Chest Portable 1 View  Result Date: 04/07/2022 CLINICAL DATA:  Chest pain EXAM: PORTABLE CHEST 1 VIEW COMPARISON:  Previous studies including the examination of 02/10/2022 FINDINGS: Transverse diameter of heart is increased. There are no signs of pulmonary edema. There is increased density in the lateral aspect of left lower lung field. Left lateral CP angle is indistinct. Right lateral CP angle is clear. There is no pneumothorax. IMPRESSION: Cardiomegaly. Increased density in the lateral aspect of left lower lung field may be an artifact due to chest wall attenuation or suggest atelectasis/pneumonia and pleural effusion. Follow-up PA and lateral views of chest along with CT if warranted may be considered. Electronically Signed   By: Elmer Picker M.D.   On: 04/07/2022 13:02    Anti-infectives: Anti-infectives (From admission, onward)    Start      Dose/Rate Route Frequency Ordered Stop   04/08/22 0000  piperacillin-tazobactam (ZOSYN) IVPB 3.375 g        3.375 g 12.5 mL/hr over 240 Minutes Intravenous Every 8 hours 04/07/22 2044     04/07/22 1745  piperacillin-tazobactam (  ZOSYN) IVPB 3.375 g        3.375 g 100 mL/hr over 30 Minutes Intravenous  Once 04/07/22 1740 04/07/22 1909       Assessment/Plan: Acute on chronic cholecystitis, s/p perc chole tube 01/30/2022, dislodged 03/11/2022, replaced 03/28/2022.  Pt back with symptoms of acute on chronic cholecystitis despite perc chole tube.  Pt may not be able to be successful with outpatient strategy.   Would hold oral anticoagulation with goal of possible lap chole this admission.    Cards has seen with intermediate risk of major cardiac event periop (6.6%).    LOS: 0 days   Milus Height, MD FACS Surgical Oncology, General Surgery, Trauma and [facility], [place] for weekday/non holidays Check [email or web address] for coverage night/weekend/holidays

## 2022-04-08 NOTE — Assessment & Plan Note (Addendum)
Stable.  Patient with history of tachybradycardia syndrome. -Patient maintained on home regimen metoprolol for rate control, Coumadin resumed and patient will need PT/INR checked on Monday, April 17, 2022.

## 2022-04-08 NOTE — Care Plan (Signed)
Called daughter, no answer, left VM.

## 2022-04-08 NOTE — Plan of Care (Signed)
  Problem: Education: Goal: Knowledge of General Education information will improve Description: Including pain rating scale, medication(s)/side effects and non-pharmacologic comfort measures Outcome: Progressing   Problem: Health Behavior/Discharge Planning: Goal: Ability to manage health-related needs will improve Outcome: Progressing   Problem: Clinical Measurements: Goal: Ability to maintain clinical measurements within normal limits will improve Outcome: Progressing Goal: Will remain free from infection Outcome: Progressing Goal: Diagnostic test results will improve Outcome: Progressing Goal: Respiratory complications will improve Outcome: Progressing Goal: Cardiovascular complication will be avoided Outcome: Progressing   Problem: Nutrition: Goal: Adequate nutrition will be maintained Outcome: Progressing   Problem: Coping: Goal: Level of anxiety will decrease Outcome: Progressing   Problem: Elimination: Goal: Will not experience complications related to bowel motility Outcome: Progressing Goal: Will not experience complications related to urinary retention Outcome: Progressing   Problem: Pain Managment: Goal: General experience of comfort will improve Outcome: Progressing   Problem: Safety: Goal: Ability to remain free from injury will improve Outcome: Progressing   Problem: Skin Integrity: Goal: Risk for impaired skin integrity will decrease Outcome: Progressing   Problem: Education: Goal: Ability to describe self-care measures that may prevent or decrease complications (Diabetes Survival Skills Education) will improve Outcome: Progressing Goal: Individualized Educational Video(s) Outcome: Progressing   Problem: Coping: Goal: Ability to adjust to condition or change in health will improve Outcome: Progressing   Problem: Fluid Volume: Goal: Ability to maintain a balanced intake and output will improve Outcome: Progressing   Problem: Health  Behavior/Discharge Planning: Goal: Ability to identify and utilize available resources and services will improve Outcome: Progressing Goal: Ability to manage health-related needs will improve Outcome: Progressing   Problem: Metabolic: Goal: Ability to maintain appropriate glucose levels will improve Outcome: Progressing   Problem: Nutritional: Goal: Maintenance of adequate nutrition will improve Outcome: Progressing Goal: Progress toward achieving an optimal weight will improve Outcome: Progressing   Problem: Skin Integrity: Goal: Risk for impaired skin integrity will decrease Outcome: Progressing   Problem: Tissue Perfusion: Goal: Adequacy of tissue perfusion will improve Outcome: Progressing   

## 2022-04-08 NOTE — Hospital Course (Signed)
Jill Crane is a 81 y.o. female with medical history significant of diabetes, hypertension, diastolic CHF, GERD, atrial fibrillation, GI bleed, cholecystitis presenting with abdominal pain.  She reports pain today at the site of her cholecystostomy tube.  This was originally placed in July due to recurrent cholecystitis and unfortunately had fallen out recently and was replaced a week ago.  In the ER, CT showed inflammation around the gallbladder, cholecystostomy tube in place.  Also small focus of intraperitoneal free air.   9/22: Admitted on antibiotics; Gen Surg consulted, recommended antibiotics and medical admission

## 2022-04-08 NOTE — Progress Notes (Signed)
  Progress Note   Patient: Jill Crane VZD:[id number] DOB: Jun 23, 1941 DOA: 04/07/2022     0 DOS: the patient was seen and examined on 04/08/2022 at 9:24 AM      Brief hospital course: JOEE IOVINE is a 81 y.o. female with medical history significant of diabetes, hypertension, diastolic CHF, GERD, atrial fibrillation, GI bleed, cholecystitis presenting with abdominal pain.  She reports pain today at the site of her cholecystostomy tube.  This was originally placed in July due to recurrent cholecystitis and unfortunately had fallen out recently and was replaced a week ago.  In the ER, CT showed inflammation around the gallbladder, cholecystostomy tube in place.  Also small focus of intraperitoneal free air.   9/22: Admitted on antibiotics; Gen Surg consulted, recommended antibiotics and medical admission     Assessment and Plan: * Acute on chronic cholecystitis - Continue Zosyn - Consult Gen Surg, appreciate cares - Hold warfarin and let INR drift down to allow for potential surgery next week, no bridging needed    History of pemphigus - Continue prednisone 7.5 mg daily  GERD (gastroesophageal reflux disease) - Continue pantoprazole  Chronic diastolic CHF (congestive heart failure) (HCC) Appears euvolemic - Continue metoprolol - Hold furosemide  Essential hypertension Blood pressure normal - Continue metoprolol - Hold furosemide  Diabetes (HCC) Glucose is normal - Hold home metformin - Sliding scale corrections  Permanent atrial fibrillation (HCC) - Hold warfarin until after surgery potentially - No vitamin K for now - Continue metoprolol          Subjective: Patient sitting up in recliner, no acute distress, no confusion, no vomiting.  Had a fever overnight.  Heart rate slightly elevated, but improving.     Physical Exam: BP 104/64 (BP Location: Right Arm)   Pulse 100   Temp 98 F (36.7 C) (Oral)   Resp 18   Ht 5\' 2"  (1.575 m)   Wt 70.1 kg    SpO2 94%   BMI 28.27 kg/m   Elderly adult female, sitting up in recliner, no acute distress, Slightly tachycardic, irregular, no murmurs, no peripheral edema Respiratory rate normal, lungs clear without rales or wheezes Abdomen soft with some mild tenderness in the right upper quadrant, percutaneous cholecystostomy tube in place, no rigidity or rebound at all Attention normal, affect normal, judgment insight appear normal, appears comfortable     Data Reviewed: Discussed with general surgery CT of the chest abdomen pelvis shows a cholecystostomy drain with some surrounding inflammation, a possible focus of free air in the peritoneum, but no other abnormal findings CMP normal except for albumin 2.6 White blood cell count down to 12 from 15 INR 3 Hemoglobin and platelets are unremarkable     Disposition: Status is: Inpatient         Author: Edwin Dada, MD 04/08/2022 11:13 AM  For on call review [email or web address].

## 2022-04-08 NOTE — Assessment & Plan Note (Addendum)
Blood pressure stable.   -Patient maintained on home regimen metoprolol, furosemide held during the hospitalization and will be resumed on discharge.

## 2022-04-09 DIAGNOSIS — K812 Acute cholecystitis with chronic cholecystitis: Secondary | ICD-10-CM | POA: Diagnosis not present

## 2022-04-09 LAB — GLUCOSE, CAPILLARY
Glucose-Capillary: 95 mg/dL (ref 70–99)
Glucose-Capillary: 184 mg/dL — ABNORMAL HIGH (ref 70–99)
Glucose-Capillary: 142 mg/dL — ABNORMAL HIGH (ref 70–99)
Glucose-Capillary: 176 mg/dL — ABNORMAL HIGH (ref 70–99)

## 2022-04-09 LAB — COMPREHENSIVE METABOLIC PANEL
ALT: 10 U/L (ref 0–44)
AST: 13 U/L — ABNORMAL LOW (ref 15–41)
Albumin: 2.8 g/dL — ABNORMAL LOW (ref 3.5–5.0)
Alkaline Phosphatase: 53 U/L (ref 38–126)
Anion gap: 7 (ref 5–15)
BUN: 12 mg/dL (ref 8–23)
CO2: 23 mmol/L (ref 22–32)
Calcium: 8.9 mg/dL (ref 8.9–10.3)
Chloride: 108 mmol/L (ref 98–111)
Creatinine, Ser: 0.94 mg/dL (ref 0.44–1.00)
GFR, Estimated: >60 mL/min (ref >60)
Glucose, Bld: 85 mg/dL (ref 70–99)
Potassium: 4.1 mmol/L (ref 3.5–5.1)
Sodium: 138 mmol/L (ref 135–145)
Total Bilirubin: 0.7 mg/dL (ref 0.3–1.2)
Total Protein: 7 g/dL (ref 6.5–8.1)

## 2022-04-09 LAB — CBC
HCT: 38.1 % (ref 36.0–46.0)
Hemoglobin: 11.8 g/dL — ABNORMAL LOW (ref 12.0–15.0)
MCH: 27 pg (ref 26.0–34.0)
MCHC: 31 g/dL (ref 30.0–36.0)
MCV: 87.2 fL (ref 80.0–100.0)
Platelets: 302 10*3/uL (ref 150–400)
RBC: 4.37 MIL/uL (ref 3.87–5.11)
RDW: 18.7 % — ABNORMAL HIGH (ref 11.5–15.5)
WBC: 11.4 10*3/uL — ABNORMAL HIGH (ref 4.0–10.5)
nRBC: 0 % (ref 0.0–0.2)

## 2022-04-09 LAB — PROTIME-INR
INR: 2.9 — ABNORMAL HIGH (ref 0.8–1.2)
Prothrombin Time: 30.2 seconds — ABNORMAL HIGH (ref 11.4–15.2)

## 2022-04-09 NOTE — Progress Notes (Signed)
Subjective/Chief Complaint: Cc abd pain Pt has tolerable abdominal pain, but it is still there despite a functional perc chole tube.    Objective: Vital signs in last 24 hours: Temp:  [98 F (36.7 C)-99.9 F (37.7 C)] 98.5 F (36.9 C) (09/24 0503) Pulse Rate:  [77-100] 100 (09/24 0503) Resp:  [16-20] 20 (09/24 0503) BP: (99-140)/(62-73) 140/73 (09/24 0503) SpO2:  [93 %-95 %] 95 % (09/24 0503) Last BM Date : 04/07/22  Intake/Output from previous day: 09/23 0701 - 09/24 0700 In: 220 [P.O.:120; IV Piggyback:100] Out: 710 [Urine:600; Drains:110] Intake/Output this shift: No intake/output data recorded.   General appearance: alert, cooperative, and no distress Resp: breathing normally GI: soft, non distended, some RUQ tenderness, esp around drain.   Lab Results:  Recent Labs    04/08/22 0624 04/09/22 0608  WBC 12.7* 11.4*  HGB 11.2* 11.8*  HCT 35.8* 38.1  PLT 299 302   BMET Recent Labs    04/08/22 0624 04/09/22 0608  NA 140 138  K 4.2 4.1  CL 111 108  CO2 22 23  GLUCOSE 95 85  BUN 12 12  CREATININE 0.88 0.94  CALCIUM 8.8* 8.9   PT/INR Recent Labs    04/08/22 0624 04/09/22 0608  LABPROT 30.6* 30.2*  INR 3.0* 2.9*   ABG No results for input(s): "PHART", "HCO3" in the last 72 hours.  Invalid input(s): "PCO2", "PO2"  Studies/Results: CT Angio Chest PE W and/or Wo Contrast  Result Date: 04/07/2022 CLINICAL DATA:  High probability for PE. Infection in the right upper quadrant. Drain placed in July. EXAM: CT ANGIOGRAPHY CHEST CT ABDOMEN AND PELVIS WITH CONTRAST TECHNIQUE: Multidetector CT imaging of the chest was performed using the standard protocol during bolus administration of intravenous contrast. Multiplanar CT image reconstructions and MIPs were obtained to evaluate the vascular anatomy. Multidetector CT imaging of the abdomen and pelvis was performed using the standard protocol during bolus administration of intravenous contrast. RADIATION DOSE  REDUCTION: This exam was performed according to the departmental dose-optimization program which includes automated exposure control, adjustment of the mA and/or kV according to patient size and/or use of iterative reconstruction technique. CONTRAST:  OMNIPAQUE IOHEXOL 350 MG/ML SOLN COMPARISON:  CT abdomen and pelvis 03/23/2022. CT angiogram chest 01/20/2021. FINDINGS: CTA CHEST FINDINGS Cardiovascular: Heart is mildly enlarged. Aorta is normal in size. There are atherosclerotic calcifications of the aorta and coronary arteries. There is no pericardial effusion. There is adequate opacification of the pulmonary arteries to the segmental level. There is no evidence for pulmonary embolism. Mediastinum/Nodes: No enlarged mediastinal, hilar, or axillary lymph nodes. Thyroid gland, trachea, and esophagus demonstrate no significant findings. Lungs/Pleura: There is minimal atelectasis in the lung bases. Lungs are otherwise clear. There is a trace right pleural effusion. The lungs are otherwise clear. No pneumothorax. Musculoskeletal: There is scoliosis of the thoracic spine with multilevel degenerative change similar to prior. Review of the MIP images confirms the above findings. CT ABDOMEN and PELVIS FINDINGS Hepatobiliary: Percutaneous cholecystostomy tube is present with distal tip coiled within the gallbladder. There is gallbladder wall thickening and surrounding inflammatory stranding. There is no evidence for adjacent focal fluid collection or adjacent air. Pneumobilia in the liver and common bile duct are present compatible with cholecystostomy tube. The liver otherwise appears within normal limits. Pancreas: Unremarkable. No pancreatic ductal dilatation or surrounding inflammatory changes. Spleen: Normal in size without focal abnormality. Adrenals/Urinary Tract: There is a 2 cm cyst in the left kidney. There is no hydronephrosis or perinephric fat  stranding. There is some scarring in the superior pole the  right kidney, unchanged. The right kidney is slightly malrotated, also unchanged. The adrenal glands and bladder are within normal limits. Stomach/Bowel: Stomach is within normal limits. Appendix appears normal. There is no focal bowel wall thickening or inflammation. Colonic diverticula are present. There are some mildly dilated central small bowel loops with air-fluid levels measuring up to 3.5 cm in the central abdomen. No significant mesenteric edema present. Vascular/Lymphatic: There is severe atherosclerotic calcifications throughout the abdomen and pelvis. Aorta is normal in size. Infrarenal IVC filter is present. Reproductive: Uterus and bilateral adnexa are unremarkable. Other: Single small focus of free air with mild surrounding inflammation seen in the anterior upper abdomen image 6/29. This is anterior to the transverse colon. Focal abdominal wall hernia. There is a small amount of intramuscular abdominal wall edema where the catheter is placed. Small amount of subcutaneous air seen in the anterior left abdomen which may represent medication injection site. No ascites. Musculoskeletal: No acute fractures. Review of the MIP images confirms the above findings. IMPRESSION: 1. Percutaneous cholecystostomy tube in place in the gallbladder. There is gallbladder wall thickening with surrounding inflammation worrisome for cholecystitis. No adjacent fluid collection. 2. Single small focus of free intraperitoneal air anterior to the transverse colon. This is indeterminate and may be related to recent catheter placement. Bowel micro perforation is not excluded in the appropriate clinical setting, although there is no focal bowel wall thickening or inflammation. 3. Mildly dilated central small bowel loops may represent ileus or partial small bowel obstruction. Continued follow-up recommended. 4. No evidence for pulmonary embolism. 5. Trace right pleural effusion. 6. Mild cardiomegaly. 7. Aortic Atherosclerosis  (ICD10-I70.0). 8. Left renal cysts.  No follow-up imaging necessary. Electronically Signed   By: Ronney Asters M.D.   On: 04/07/2022 16:46   CT ABDOMEN PELVIS W CONTRAST  Result Date: 04/07/2022 CLINICAL DATA:  High probability for PE. Infection in the right upper quadrant. Drain placed in July. EXAM: CT ANGIOGRAPHY CHEST CT ABDOMEN AND PELVIS WITH CONTRAST TECHNIQUE: Multidetector CT imaging of the chest was performed using the standard protocol during bolus administration of intravenous contrast. Multiplanar CT image reconstructions and MIPs were obtained to evaluate the vascular anatomy. Multidetector CT imaging of the abdomen and pelvis was performed using the standard protocol during bolus administration of intravenous contrast. RADIATION DOSE REDUCTION: This exam was performed according to the departmental dose-optimization program which includes automated exposure control, adjustment of the mA and/or kV according to patient size and/or use of iterative reconstruction technique. CONTRAST:  133mL OMNIPAQUE IOHEXOL 350 MG/ML SOLN COMPARISON:  CT abdomen and pelvis 03/23/2022. CT angiogram chest 01/20/2021. FINDINGS: CTA CHEST FINDINGS Cardiovascular: Heart is mildly enlarged. Aorta is normal in size. There are atherosclerotic calcifications of the aorta and coronary arteries. There is no pericardial effusion. There is adequate opacification of the pulmonary arteries to the segmental level. There is no evidence for pulmonary embolism. Mediastinum/Nodes: No enlarged mediastinal, hilar, or axillary lymph nodes. Thyroid gland, trachea, and esophagus demonstrate no significant findings. Lungs/Pleura: There is minimal atelectasis in the lung bases. Lungs are otherwise clear. There is a trace right pleural effusion. The lungs are otherwise clear. No pneumothorax. Musculoskeletal: There is scoliosis of the thoracic spine with multilevel degenerative change similar to prior. Review of the MIP images confirms the  above findings. CT ABDOMEN and PELVIS FINDINGS Hepatobiliary: Percutaneous cholecystostomy tube is present with distal tip coiled within the gallbladder. There is gallbladder wall thickening and surrounding  inflammatory stranding. There is no evidence for adjacent focal fluid collection or adjacent air. Pneumobilia in the liver and common bile duct are present compatible with cholecystostomy tube. The liver otherwise appears within normal limits. Pancreas: Unremarkable. No pancreatic ductal dilatation or surrounding inflammatory changes. Spleen: Normal in size without focal abnormality. Adrenals/Urinary Tract: There is a 2 cm cyst in the left kidney. There is no hydronephrosis or perinephric fat stranding. There is some scarring in the superior pole the right kidney, unchanged. The right kidney is slightly malrotated, also unchanged. The adrenal glands and bladder are within normal limits. Stomach/Bowel: Stomach is within normal limits. Appendix appears normal. There is no focal bowel wall thickening or inflammation. Colonic diverticula are present. There are some mildly dilated central small bowel loops with air-fluid levels measuring up to 3.5 cm in the central abdomen. No significant mesenteric edema present. Vascular/Lymphatic: There is severe atherosclerotic calcifications throughout the abdomen and pelvis. Aorta is normal in size. Infrarenal IVC filter is present. Reproductive: Uterus and bilateral adnexa are unremarkable. Other: Single small focus of free air with mild surrounding inflammation seen in the anterior upper abdomen image 6/29. This is anterior to the transverse colon. Focal abdominal wall hernia. There is a small amount of intramuscular abdominal wall edema where the catheter is placed. Small amount of subcutaneous air seen in the anterior left abdomen which may represent medication injection site. No ascites. Musculoskeletal: No acute fractures. Review of the MIP images confirms the above  findings. IMPRESSION: 1. Percutaneous cholecystostomy tube in place in the gallbladder. There is gallbladder wall thickening with surrounding inflammation worrisome for cholecystitis. No adjacent fluid collection. 2. Single small focus of free intraperitoneal air anterior to the transverse colon. This is indeterminate and may be related to recent catheter placement. Bowel micro perforation is not excluded in the appropriate clinical setting, although there is no focal bowel wall thickening or inflammation. 3. Mildly dilated central small bowel loops may represent ileus or partial small bowel obstruction. Continued follow-up recommended. 4. No evidence for pulmonary embolism. 5. Trace right pleural effusion. 6. Mild cardiomegaly. 7. Aortic Atherosclerosis (ICD10-I70.0). 8. Left renal cysts.  No follow-up imaging necessary. Electronically Signed   By: Darliss Cheney M.D.   On: 04/07/2022 16:46   DG Chest Portable 1 View  Result Date: 04/07/2022 CLINICAL DATA:  Chest pain EXAM: PORTABLE CHEST 1 VIEW COMPARISON:  Previous studies including the examination of 02/10/2022 FINDINGS: Transverse diameter of heart is increased. There are no signs of pulmonary edema. There is increased density in the lateral aspect of left lower lung field. Left lateral CP angle is indistinct. Right lateral CP angle is clear. There is no pneumothorax. IMPRESSION: Cardiomegaly. Increased density in the lateral aspect of left lower lung field may be an artifact due to chest wall attenuation or suggest atelectasis/pneumonia and pleural effusion. Follow-up PA and lateral views of chest along with CT if warranted may be considered. Electronically Signed   By: Ernie Avena M.D.   On: 04/07/2022 13:02    Anti-infectives: Anti-infectives (From admission, onward)    Start     Dose/Rate Route Frequency Ordered Stop   04/08/22 0000  piperacillin-tazobactam (ZOSYN) IVPB 3.375 g        3.375 g 12.5 mL/hr over 240 Minutes Intravenous Every  8 hours 04/07/22 2044     04/07/22 1745  piperacillin-tazobactam (ZOSYN) IVPB 3.375 g        3.375 g 100 mL/hr over 30 Minutes Intravenous  Once 04/07/22 1740 04/07/22 1909  Assessment/Plan: Acute on chronic cholecystitis, s/p perc chole tube 01/30/2022, dislodged 03/11/2022, replaced 03/28/2022.  Pt back with symptoms of acute on chronic cholecystitis despite perc chole tube.   Pt may not be able to be successful with outpatient strategy.   Holding oral anticoagulation with goal of possible lap chole this admission.    Cards has seen with intermediate risk of major cardiac event periop (6.6%).    LOS: 1 day   Maudry Diego, MD FACS Surgical Oncology, General Surgery, Trauma and [facility] Surgery, [place] [phone] for weekday/non holidays Check [email or web address] for coverage night/weekend/holidays

## 2022-04-09 NOTE — Progress Notes (Signed)
ANTICOAGULATION CONSULT NOTE   Pharmacy Consult for warfarin Indication: atrial fibrillation  No Known Allergies  Patient Measurements: Height: 5\' 2"  (157.5 cm) Weight: 70.1 kg (154 lb 8.7 oz) IBW/kg (Calculated) : 50.1 Heparin Dosing Weight:   Vital Signs: Temp: 98.5 F (36.9 C) (09/24 0503) Temp Source: Oral (09/24 0503) BP: 105/67 (09/24 0907) Pulse Rate: 100 (09/24 0503)  Labs: Recent Labs    04/07/22 1228 04/07/22 1405 04/08/22 0624 04/09/22 0608  HGB 12.5  --  11.2* 11.8*  HCT 39.0  --  35.8* 38.1  PLT 330  --  299 302  LABPROT 29.5*  --  30.6* 30.2*  INR 2.8*  --  3.0* 2.9*  CREATININE 0.93  --  0.88 0.94  TROPONINIHS 7 7  --   --      Estimated Creatinine Clearance: 43.8 mL/min (by C-G formula based on SCr of 0.94 mg/dL).   PTA Medication Dose:  Warfarin 2 mg daily except 4 mg on Tuesdays and Thursdays   Assessment: Pharmacy is consulted to dose warfarin in 81 yo female with PMH of atrial fibrillation. Pt admitted with IAI. LD per med rec is 04/06/22  Today, 04/09/22  INR 2.9, therapeutic No warfarin administered yesterday and is currently on hold (last dose was 2 mg warfarin on 9/22) Hgb slightly low/stable, Plt WNL     Goal of Therapy:  INR 2-3 Monitor platelets by anticoagulation protocol: Yes   Plan:  Continue to hold warfarin while in preparation for potential surgery next week with no bridging needed per attending Continue daily INR  Monitor for signs and symptoms of bleeding   Thank you for allowing pharmacy to be a part of this patient's care.  Royetta Asal, PharmD, BCPS Clinical Pharmacist [place] Please utilize Amion for appropriate phone number to reach the unit pharmacist ([place]) 04/09/2022 9:52 AM

## 2022-04-09 NOTE — Care Plan (Signed)
Spoke with daughter by phone

## 2022-04-09 NOTE — Progress Notes (Signed)
  Progress Note   Patient: Jill Crane HER:[id number] DOB: 01-29-1941 DOA: 04/07/2022     1 DOS: the patient was seen and examined on 04/09/2022 at 11:04AM      Brief hospital course: Jill Crane is a 81 y.o. female with medical history significant of diabetes, hypertension, diastolic CHF, GERD, atrial fibrillation, GI bleed, cholecystitis presenting with abdominal pain.  She reports pain today at the site of her cholecystostomy tube.  This was originally placed in July due to recurrent cholecystitis and unfortunately had fallen out recently and was replaced a week ago.  In the ER, CT showed inflammation around the gallbladder, cholecystostomy tube in place.  Also small focus of intraperitoneal free air.   9/22: Admitted on antibiotics; Gen Surg consulted, recommended antibiotics and medical admission     Assessment and Plan: * Acute on chronic cholecystitis - Continue Zosyn - Consult Gen Surg, appreciate cares - Hold warfarin and let INR drift down to allow for potential surgery next week, no bridging needed    History of pemphigus - Continue prednisone 7.5 mg daily  GERD (gastroesophageal reflux disease) - Continue pantoprazole  Chronic diastolic CHF (congestive heart failure) (HCC) Appears euvolemic - Continue metoprolol - Hold furosemide  Essential hypertension Blood pressure normal - Continue metoprolol - Hold furosemide  Diabetes (HCC) Glucose is normal - Hold home metformin - Sliding scale corrections  Permanent atrial fibrillation (HCC) Stable.  HR controlled.  Will stop tele for now and resume post-op - Hold warfarin until after surgery potentially - No vitamin K for now - Continue metoprolol          Subjective: No complaints, no fever, no vomiting, no confusion.  She still has some mild right-sided upper quadrant pain.     Physical Exam: BP 109/76 (BP Location: Left Arm)   Pulse 88   Temp 98.4 F (36.9 C) (Oral)   Resp 14   Ht  5\' 2"  (1.575 m)   Wt 70.1 kg   SpO2 95%   BMI 28.27 kg/m   Elderly adult female, sitting up in bed, watching television, no acute distress RRR, regular, no murmurs, no peripheral edema Respiratory rate normal, lungs clear without rales or wheezes Mild right upper quadrant tenderness, cholecystostomy tube in place, no rigidity Attention normal, affect normal, judgment insight appear normal  Data Reviewed: Albumin level, basic metabolic panel otherwise normal INR 2.9 Glucose normal White blood cell count at 11       Disposition: Status is: Inpatient         Author: Edwin Dada, MD 04/09/2022 4:00 PM  For on call review [email or web address].

## 2022-04-09 NOTE — Plan of Care (Signed)

## 2022-04-10 DIAGNOSIS — K812 Acute cholecystitis with chronic cholecystitis: Secondary | ICD-10-CM | POA: Diagnosis not present

## 2022-04-10 LAB — CBC
HCT: 35.7 % — ABNORMAL LOW (ref 36.0–46.0)
Hemoglobin: 11.3 g/dL — ABNORMAL LOW (ref 12.0–15.0)
MCH: 27.3 pg (ref 26.0–34.0)
MCHC: 31.7 g/dL (ref 30.0–36.0)
MCV: 86.2 fL (ref 80.0–100.0)
Platelets: 294 10*3/uL (ref 150–400)
RBC: 4.14 MIL/uL (ref 3.87–5.11)
RDW: 18.4 % — ABNORMAL HIGH (ref 11.5–15.5)
WBC: 9.4 10*3/uL (ref 4.0–10.5)
nRBC: 0 % (ref 0.0–0.2)

## 2022-04-10 LAB — COMPREHENSIVE METABOLIC PANEL
ALT: 11 U/L (ref 0–44)
AST: 12 U/L — ABNORMAL LOW (ref 15–41)
Albumin: 2.6 g/dL — ABNORMAL LOW (ref 3.5–5.0)
Alkaline Phosphatase: 46 U/L (ref 38–126)
Anion gap: 8 (ref 5–15)
BUN: 15 mg/dL (ref 8–23)
CO2: 22 mmol/L (ref 22–32)
Calcium: 9 mg/dL (ref 8.9–10.3)
Chloride: 109 mmol/L (ref 98–111)
Creatinine, Ser: 0.9 mg/dL (ref 0.44–1.00)
GFR, Estimated: >60 mL/min (ref >60)
Glucose, Bld: 78 mg/dL (ref 70–99)
Potassium: 3.7 mmol/L (ref 3.5–5.1)
Sodium: 139 mmol/L (ref 135–145)
Total Bilirubin: 0.6 mg/dL (ref 0.3–1.2)
Total Protein: 6.8 g/dL (ref 6.5–8.1)

## 2022-04-10 LAB — GLUCOSE, CAPILLARY
Glucose-Capillary: 92 mg/dL (ref 70–99)
Glucose-Capillary: 144 mg/dL — ABNORMAL HIGH (ref 70–99)
Glucose-Capillary: 152 mg/dL — ABNORMAL HIGH (ref 70–99)
Glucose-Capillary: 157 mg/dL — ABNORMAL HIGH (ref 70–99)

## 2022-04-10 LAB — PROTIME-INR
INR: 2.2 — ABNORMAL HIGH (ref 0.8–1.2)
Prothrombin Time: 24.3 seconds — ABNORMAL HIGH (ref 11.4–15.2)

## 2022-04-10 NOTE — Plan of Care (Signed)

## 2022-04-10 NOTE — Progress Notes (Signed)
[facility] Progress Note     Subjective: CC-  Family at bedside. Tolerating diet. No n/v. Continues to have some RUQ pain around the tube with palpation. No abdominal pain this morning with PO intake. Patient states that she would prefer to have lap chole this admission. INR 2.2  Objective: Vital signs in last 24 hours: Temp:  [97.2 F (36.2 C)-98.4 F (36.9 C)] 97.2 F (36.2 C) (09/25 0447) Pulse Rate:  [62-99] 62 (09/25 0447) Resp:  [14-20] 18 (09/25 0447) BP: (109-155)/(76-89) 138/77 (09/25 0447) SpO2:  [98 %-99 %] 99 % (09/25 0447) Last BM Date : 04/09/22  Intake/Output from previous day: 09/24 0701 - 09/25 0700 In: 717 [P.O.:717] Out: 70 [Drains:70] Intake/Output this shift: No intake/output data recorded.  PE: Gen:  Alert, NAD, pleasant Resp: rate and effort normal Abd: soft, ND, mild RUQ TTP around drain  Lab Results:  Recent Labs    04/09/22 0608 04/10/22 0540  WBC 11.4* 9.4  HGB 11.8* 11.3*  HCT 38.1 35.7*  PLT 302 294   BMET Recent Labs    04/09/22 0608 04/10/22 0540  NA 138 139  K 4.1 3.7  CL 108 109  CO2 23 22  GLUCOSE 85 78  BUN 12 15  CREATININE 0.94 0.90  CALCIUM 8.9 9.0   PT/INR Recent Labs    04/09/22 0608 04/10/22 0540  LABPROT 30.2* 24.3*  INR 2.9* 2.2*   CMP     Component Value Date/Time   NA 139 04/10/2022 0540   K 3.7 04/10/2022 0540   CL 109 04/10/2022 0540   CO2 22 04/10/2022 0540   GLUCOSE 78 04/10/2022 0540   BUN 15 04/10/2022 0540   CREATININE 0.90 04/10/2022 0540   CALCIUM 9.0 04/10/2022 0540   PROT 6.8 04/10/2022 0540   ALBUMIN 2.6 (L) 04/10/2022 0540   AST 12 (L) 04/10/2022 0540   ALT 11 04/10/2022 0540   ALKPHOS 46 04/10/2022 0540   BILITOT 0.6 04/10/2022 0540   GFRNONAA >60 04/10/2022 0540   GFRAA >60 11/11/2019 0518   Lipase     Component Value Date/Time   LIPASE 22 04/07/2022 1228       Studies/Results: No results found.  Anti-infectives: Anti-infectives (From admission,  onward)    Start     Dose/Rate Route Frequency Ordered Stop   04/08/22 0000  piperacillin-tazobactam (ZOSYN) IVPB 3.375 g        3.375 g 12.5 mL/hr over 240 Minutes Intravenous Every 8 hours 04/07/22 2044     04/07/22 1745  piperacillin-tazobactam (ZOSYN) IVPB 3.375 g        3.375 g 100 mL/hr over 30 Minutes Intravenous  Once 04/07/22 1740 04/07/22 1909        Assessment/Plan Acute on chronic cholecystitis, s/p perc chole tube 01/30/2022, dislodged 03/11/2022, replaced 03/28/2022.  - Pt back with symptoms of acute on chronic cholecystitis despite perc chole tube.  - Cards has seen 9/8 with intermediate risk of major cardiac event periop (6.6%).  - WBC WNL, VSS, afebrile, and tolerating soft diet. Perc chole is in and functioning but she continues to have pain around the tube and would like to consider lap chole this admission. MD to see and discuss further. 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    LOS: 2 days    Franne Forts, [facility] 04/10/2022, 9:38 AM Please see Amion for pager number during day hours 7:00am-4:30pm

## 2022-04-10 NOTE — TOC Progression Note (Signed)
Transition of Care [facility]) - Progression Note    Patient Details  Name: Jill Crane MRN: [id number] Date of Birth: 1941-02-25  [facility] CM/SW Contact  Ross Ludwig, [place] Phone Number: 04/10/2022, 1:11 PM  Clinical Narrative:     CSW was informed that patient is currently open to [facility] for [facility] PT, and OT.  Patient will need updated [place] PT and OT orders and face to face prior to patient discharging back home.  CSW to continue to follow patient's progress throughout discharge planning.          Expected Discharge Plan and Services                                                 Social Determinants of Health (SDOH) Interventions    Readmission Risk Interventions    03/28/2022    1:53 PM 02/02/2022   10:51 AM  Readmission Risk Prevention Plan  Post Dischage Appt Complete Complete  Medication Screening Complete Complete  Transportation Screening Complete Complete

## 2022-04-10 NOTE — Progress Notes (Signed)
  Progress Note   Patient: Jill Crane DPO:[id number] DOB: Nov 17, 1940 DOA: 04/07/2022     2 DOS: the patient was seen and examined on 04/10/2022 at 8:33AM      Brief hospital course: Jill Crane is a 81 y.o. female with medical history significant of diabetes, hypertension, diastolic CHF, GERD, atrial fibrillation, GI bleed, cholecystitis presenting with abdominal pain.       Assessment and Plan: * Acute on chronic cholecystitis - Continue Zosyn - Consult Gen Surg, appreciate cares - Hold warfarin and let INR drift down to allow for potential surgery next week, no bridging needed    History of pemphigus - Continue prednisone 7.5 mg daily  GERD (gastroesophageal reflux disease) - Continue pantoprazole  Chronic diastolic CHF (congestive heart failure) (HCC) Appears euvolemic - Continue metoprolol - Hold furosemide  Essential hypertension Blood pressure normal - Continue metoprolol - Hold furosemide  Diabetes (HCC) Glucose is normal - Hold home metformin - Sliding scale corrections  Permanent atrial fibrillation (HCC) Stable.  HR controlled.  Will stop tele for now and resume post-op - Hold warfarin until after surgery potentially - No vitamin K for now - Continue metoprolol          Subjective: No complaints, appetite okay, no fever, no confusion, no change in mild abdominal pian     Physical Exam: BP 134/69 (BP Location: Right Arm)   Pulse 60   Temp 98.2 F (36.8 C) (Oral)   Resp 16   Ht 5\' 2"  (1.575 m)   Wt 70.1 kg   SpO2 99%   BMI 28.27 kg/m   Elderly adult female, sitting in recliner, no acute distress, eating breakfast RRR, regular, no murmurs, no peripheral edema Respiratory rate normal, lungs clear without rales or wheezes Mild right upper quadrant tenderness, cholecystostomy tube in place, no rigidity or rebound Attention normal, affect appropriate, judgment insight appear at her baseline, face symmetric, speech fluent    Data  Reviewed: Discussed with general surgery team INR down to 2.2 Hemogram unremarkable CMP with albumin 2.6, otherwise normal     Disposition: Status is: Inpatient         Author: Edwin Dada, MD 04/10/2022 2:21 PM  For on call review [email or web address].

## 2022-04-10 NOTE — Progress Notes (Signed)
Mobility Specialist - Progress Note   04/10/22 1307  Mobility  Activity Ambulated with assistance in hallway  Level of Assistance Standby assist, set-up cues, supervision of patient - no hands on  Assistive Device Front wheel walker  Distance Ambulated (ft) 500 ft  Activity Response Tolerated well  $Mobility charge 1 Mobility   Pt received in bed and agreed for mobility, no c/o pain nor discomfort. Pt back to bed with all needs met.   Roderick Pee Mobility Specialist

## 2022-04-11 DIAGNOSIS — K812 Acute cholecystitis with chronic cholecystitis: Secondary | ICD-10-CM | POA: Diagnosis not present

## 2022-04-11 LAB — GLUCOSE, CAPILLARY
Glucose-Capillary: 92 mg/dL (ref 70–99)
Glucose-Capillary: 130 mg/dL — ABNORMAL HIGH (ref 70–99)
Glucose-Capillary: 142 mg/dL — ABNORMAL HIGH (ref 70–99)

## 2022-04-11 LAB — PROTIME-INR
INR: 1.6 — ABNORMAL HIGH (ref 0.8–1.2)
Prothrombin Time: 19.2 seconds — ABNORMAL HIGH (ref 11.4–15.2)

## 2022-04-11 MED ORDER — INDOCYANINE GREEN 25 MG IV SOLR
7.5000 mg | INTRAVENOUS | Status: DC
  Filled 2022-04-11: qty 10

## 2022-04-11 MED ORDER — CHLORHEXIDINE GLUCONATE CLOTH 2 % EX PADS
6.0000 | MEDICATED_PAD | Freq: Once | CUTANEOUS | Status: AC
  Administered 2022-04-11: 6 via TOPICAL

## 2022-04-11 MED ORDER — INDOCYANINE GREEN 25 MG IV SOLR
7.5000 mg | Freq: Once | INTRAVENOUS | Status: DC
  Filled 2022-04-11: qty 10

## 2022-04-11 NOTE — Progress Notes (Signed)
[facility] Progress Note     Subjective: CC-  Feels about the same as yesterday. Tolerating diet. Continues to have some intermittent RUQ pain around drain. Discussed with family yesterday and she has decided that she would like to proceed with lap chole this admission.  Objective: Vital signs in last 24 hours: Temp:  [97.4 F (36.3 C)-98.2 F (36.8 C)] 97.4 F (36.3 C) (09/26 0618) Pulse Rate:  [58-88] 58 (09/26 0618) Resp:  [16-20] 18 (09/26 0618) BP: (111-151)/(69-84) 151/72 (09/26 0618) SpO2:  [97 %-99 %] 97 % (09/26 0618) Last BM Date : 04/09/22  Intake/Output from previous day: 09/25 0701 - 09/26 0700 In: 483 [P.O.:480; I.V.:3] Out: 1500 [Urine:1500] Intake/Output this shift: No intake/output data recorded.  PE: Gen:  Alert, NAD, pleasant Resp: rate and effort normal Abd: soft, ND, mild RUQ TTP around drain, small amount of bilious fluid in bag  Lab Results:  Recent Labs    04/09/22 0608 04/10/22 0540  WBC 11.4* 9.4  HGB 11.8* 11.3*  HCT 38.1 35.7*  PLT 302 294   BMET Recent Labs    04/09/22 0608 04/10/22 0540  NA 138 139  K 4.1 3.7  CL 108 109  CO2 23 22  GLUCOSE 85 78  BUN 12 15  CREATININE 0.94 0.90  CALCIUM 8.9 9.0   PT/INR Recent Labs    04/10/22 0540 04/11/22 0625  LABPROT 24.3* 19.2*  INR 2.2* 1.6*   CMP     Component Value Date/Time   NA 139 04/10/2022 0540   K 3.7 04/10/2022 0540   CL 109 04/10/2022 0540   CO2 22 04/10/2022 0540   GLUCOSE 78 04/10/2022 0540   BUN 15 04/10/2022 0540   CREATININE 0.90 04/10/2022 0540   CALCIUM 9.0 04/10/2022 0540   PROT 6.8 04/10/2022 0540   ALBUMIN 2.6 (L) 04/10/2022 0540   AST 12 (L) 04/10/2022 0540   ALT 11 04/10/2022 0540   ALKPHOS 46 04/10/2022 0540   BILITOT 0.6 04/10/2022 0540   GFRNONAA >60 04/10/2022 0540   GFRAA >60 11/11/2019 0518   Lipase     Component Value Date/Time   LIPASE 22 04/07/2022 1228       Studies/Results: No results  found.  Anti-infectives: Anti-infectives (From admission, onward)    Start     Dose/Rate Route Frequency Ordered Stop   04/08/22 0000  piperacillin-tazobactam (ZOSYN) IVPB 3.375 g        3.375 g 12.5 mL/hr over 240 Minutes Intravenous Every 8 hours 04/07/22 2044     04/07/22 1745  piperacillin-tazobactam (ZOSYN) IVPB 3.375 g        3.375 g 100 mL/hr over 30 Minutes Intravenous  Once 04/07/22 1740 04/07/22 1909        Assessment/Plan Acute on chronic cholecystitis, s/p perc chole tube 01/30/2022, dislodged 03/11/2022, replaced 03/28/2022.  - Pt back with symptoms of acute on chronic cholecystitis despite perc chole tube.  - Cards has seen 9/8 with intermediate risk of major cardiac event periop (6.6%).  - Multiple discussions with patient and family regarding risks of surgery. She understands the risks and has decided that she would like to proceed with lap chole this admission. Plan for surgery tomorrow. Continue to hold coumadin. NPO after midnight.   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    LOS: 3 days    Franne Forts, [facility] Surgery 04/11/2022, 8:12 AM Please see Amion for pager number during day hours 7:00am-4:30pm

## 2022-04-11 NOTE — Progress Notes (Signed)
Mobility Specialist - Progress Note   04/11/22 1215  Mobility  Activity Ambulated with assistance in hallway  Level of Assistance Standby assist, set-up cues, supervision of patient - no hands on  Assistive Device Front wheel walker  Distance Ambulated (ft) 350 ft  Activity Response Tolerated well  $Mobility charge 1 Mobility   Pt received in bed and agreed for mobility, no c/o pain nor discomfort during ambulation. Pt to chair with all needs met.   Roderick Pee Mobility Specialist

## 2022-04-11 NOTE — Progress Notes (Signed)
  Progress Note   Patient: Jill Crane XBM:[id number] DOB: [birth year] DOA: 04/07/2022     3 DOS: the patient was seen and examined on 04/11/2022 at 10:52AM      Brief hospital course: Jill Crane is an 81 y.o. 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  Had originally had perc chole tube placed in July due to recurrent cholecystitis.  One week prior to admission, tube was dislodged and replaced.    Returned due to In the ER, CT showed inflammation around the gallbladder, cholecystostomy tube in place.  Also small focus of intraperitoneal free air.   9/22: Admitted on antibiotics 9/23: Gen Surg consulted, unable to offer surgery over the weekend, recommended waiting for Monday team to evaluate 9/25: General Surgery discuss surgery with family, plan for Weds     Assessment and Plan: * Acute on chronic cholecystitis - Continue Zosyn - Consult Gen Surg, appreciate cares - Hold warfarin and let INR drift down to allow for potential surgery next week, no bridging needed    History of pemphigus - Continue prednisone 7.5 mg daily  Permanent atrial fibrillation (HCC) Stable.  HR controlled.  Will stop tele for now and resume post-op - Hold warfarin until after surgery potentially - No vitamin K for now - Continue metoprolol  Chronic diastolic CHF (congestive heart failure) (HCC) Essential hypertension Appears euvolemic Blood pressure normal - Continue metoprolol - Hold furosemide  Diabetes (HCC) Glucose is normal - Hold home metformin - Sliding scale corrections  Anxiety - Continue BuSpar         Subjective: No new complaints.  No fever.  Appetite okay.  She has some mild pain in the right upper quadrant.     Physical Exam: BP 110/64   Pulse 84   Temp (!) 97.4 F (36.3 C) (Oral)   Resp 18   Ht 5\' 2"  (1.575 m)   Wt  70.1 kg   SpO2 97%   BMI 28.27 kg/m    Adult female, lying in bed, no acute distress, RRR, no murmurs, no lower extremity edema Respiratory rate normal, lungs clear without rales or wheezes Abdomen soft, no rigidity She has some right upper quadrant tenderness, she has a cholecystostomy tube Attention normal, affect normal, judgment and insight appear normal    Data Reviewed: INR 1.6 Glucose normal       Disposition: Status is: Inpatient This is a 81 year old woman with atrial fibrillation on warfarin, compensated heart failure who presents with cholecystitis and cholecystostomy tube, failed outpatient management.    She has been evaluated by general surgery who recommends surgery on Wednesday.   Postop care per general surgery.  Likely home at the end of the week        Author: Edwin Dada, MD 04/11/2022 2:17 PM  For on call review [email or web address].

## 2022-04-11 NOTE — Care Management Important Message (Signed)
Important Message  Patient Details IM Letter given to the Patient. Name: Jill Crane MRN: [id number] Date of Birth: 07/01/1941   Medicare Important Message Given:  Yes     [name] 04/11/2022, 11:44 AM

## 2022-04-12 ENCOUNTER — Encounter (HOSPITAL_COMMUNITY): Payer: Self-pay | Admitting: Internal Medicine

## 2022-04-12 ENCOUNTER — Inpatient Hospital Stay (HOSPITAL_COMMUNITY): Payer: Medicare Other | Admitting: Anesthesiology

## 2022-04-12 ENCOUNTER — Encounter: Payer: Medicare Other | Admitting: Vascular Surgery

## 2022-04-12 ENCOUNTER — Other Ambulatory Visit: Payer: Self-pay

## 2022-04-12 ENCOUNTER — Encounter (HOSPITAL_COMMUNITY)
Admission: EM | Disposition: A | Discharge status: Home Health | Payer: Self-pay | Source: Home / Self Care | Attending: Family Medicine

## 2022-04-12 DIAGNOSIS — K811 Chronic cholecystitis: Secondary | ICD-10-CM

## 2022-04-12 DIAGNOSIS — K219 Gastro-esophageal reflux disease without esophagitis: Secondary | ICD-10-CM | POA: Diagnosis not present

## 2022-04-12 DIAGNOSIS — I110 Hypertensive heart disease with heart failure: Secondary | ICD-10-CM

## 2022-04-12 DIAGNOSIS — I10 Essential (primary) hypertension: Secondary | ICD-10-CM | POA: Diagnosis not present

## 2022-04-12 DIAGNOSIS — E1165 Type 2 diabetes mellitus with hyperglycemia: Secondary | ICD-10-CM | POA: Diagnosis not present

## 2022-04-12 DIAGNOSIS — Z9049 Acquired absence of other specified parts of digestive tract: Secondary | ICD-10-CM

## 2022-04-12 DIAGNOSIS — J449 Chronic obstructive pulmonary disease, unspecified: Secondary | ICD-10-CM

## 2022-04-12 DIAGNOSIS — I4821 Permanent atrial fibrillation: Secondary | ICD-10-CM

## 2022-04-12 DIAGNOSIS — K812 Acute cholecystitis with chronic cholecystitis: Secondary | ICD-10-CM | POA: Diagnosis not present

## 2022-04-12 DIAGNOSIS — I5030 Unspecified diastolic (congestive) heart failure: Secondary | ICD-10-CM

## 2022-04-12 DIAGNOSIS — Z87891 Personal history of nicotine dependence: Secondary | ICD-10-CM

## 2022-04-12 HISTORY — PX: CHOLECYSTECTOMY: SHX55

## 2022-04-12 LAB — COMPREHENSIVE METABOLIC PANEL
ALT: 14 U/L (ref 0–44)
AST: 15 U/L (ref 15–41)
Albumin: 2.7 g/dL — ABNORMAL LOW (ref 3.5–5.0)
Alkaline Phosphatase: 47 U/L (ref 38–126)
Anion gap: 7 (ref 5–15)
BUN: 17 mg/dL (ref 8–23)
CO2: 23 mmol/L (ref 22–32)
Calcium: 9 mg/dL (ref 8.9–10.3)
Chloride: 109 mmol/L (ref 98–111)
Creatinine, Ser: 0.79 mg/dL (ref 0.44–1.00)
GFR, Estimated: >60 mL/min (ref >60)
Glucose, Bld: 81 mg/dL (ref 70–99)
Potassium: 4.1 mmol/L (ref 3.5–5.1)
Sodium: 139 mmol/L (ref 135–145)
Total Bilirubin: 0.5 mg/dL (ref 0.3–1.2)
Total Protein: 6.6 g/dL (ref 6.5–8.1)

## 2022-04-12 LAB — CBC
HCT: 37.8 % (ref 36.0–46.0)
Hemoglobin: 11.7 g/dL — ABNORMAL LOW (ref 12.0–15.0)
MCH: 26.7 pg (ref 26.0–34.0)
MCHC: 31 g/dL (ref 30.0–36.0)
MCV: 86.3 fL (ref 80.0–100.0)
Platelets: 312 10*3/uL (ref 150–400)
RBC: 4.38 MIL/uL (ref 3.87–5.11)
RDW: 17.9 % — ABNORMAL HIGH (ref 11.5–15.5)
WBC: 7.9 10*3/uL (ref 4.0–10.5)
nRBC: 0 % (ref 0.0–0.2)

## 2022-04-12 LAB — PROTIME-INR
INR: 1.5 — ABNORMAL HIGH (ref 0.8–1.2)
Prothrombin Time: 18.2 seconds — ABNORMAL HIGH (ref 11.4–15.2)

## 2022-04-12 LAB — GLUCOSE, CAPILLARY
Glucose-Capillary: 94 mg/dL (ref 70–99)
Glucose-Capillary: 125 mg/dL — ABNORMAL HIGH (ref 70–99)
Glucose-Capillary: 272 mg/dL — ABNORMAL HIGH (ref 70–99)
Glucose-Capillary: 265 mg/dL — ABNORMAL HIGH (ref 70–99)

## 2022-04-12 SURGERY — LAPAROSCOPIC CHOLECYSTECTOMY
Anesthesia: General | Site: Abdomen

## 2022-04-12 MED ORDER — AMISULPRIDE (ANTIEMETIC) 5 MG/2ML IV SOLN: 10.0000 mg | Freq: Once | INTRAVENOUS | Status: DC | PRN

## 2022-04-12 MED ORDER — ROCURONIUM BROMIDE 10 MG/ML (PF) SYRINGE
PREFILLED_SYRINGE | INTRAVENOUS | Status: AC
  Filled 2022-04-12: qty 10

## 2022-04-12 MED ORDER — LIDOCAINE HCL (PF) 2 % IJ SOLN
INTRAMUSCULAR | Status: AC
  Filled 2022-04-12: qty 5

## 2022-04-12 MED ORDER — LABETALOL HCL 5 MG/ML IV SOLN
INTRAVENOUS | Status: DC | PRN
  Administered 2022-04-12: 5 mg via INTRAVENOUS

## 2022-04-12 MED ORDER — BUPIVACAINE-EPINEPHRINE 0.25% -1:200000 IJ SOLN
INTRAMUSCULAR | Status: DC | PRN
  Administered 2022-04-12: 30 mL

## 2022-04-12 MED ORDER — LACTATED RINGERS IR SOLN
Status: DC | PRN
  Administered 2022-04-12: 1000 mL

## 2022-04-12 MED ORDER — ONDANSETRON HCL 4 MG/2ML IJ SOLN
INTRAMUSCULAR | Status: AC
  Filled 2022-04-12: qty 2

## 2022-04-12 MED ORDER — METOPROLOL TARTRATE 5 MG/5ML IV SOLN
INTRAVENOUS | Status: DC | PRN
  Administered 2022-04-12: 1 mg via INTRAVENOUS
  Administered 2022-04-12 (×2): 2 mg via INTRAVENOUS

## 2022-04-12 MED ORDER — OXYCODONE HCL 5 MG PO TABS: 5.0000 mg | ORAL_TABLET | Freq: Once | ORAL | Status: DC | PRN

## 2022-04-12 MED ORDER — BUPIVACAINE-EPINEPHRINE (PF) 0.25% -1:200000 IJ SOLN
INTRAMUSCULAR | Status: AC
  Filled 2022-04-12: qty 30

## 2022-04-12 MED ORDER — OXYCODONE HCL 5 MG/5ML PO SOLN: 5.0000 mg | Freq: Once | ORAL | Status: DC | PRN

## 2022-04-12 MED ORDER — ONDANSETRON HCL 4 MG/2ML IJ SOLN
INTRAMUSCULAR | Status: DC | PRN
  Administered 2022-04-12: 4 mg via INTRAVENOUS

## 2022-04-12 MED ORDER — FENTANYL CITRATE (PF) 100 MCG/2ML IJ SOLN
INTRAMUSCULAR | Status: AC
  Filled 2022-04-12: qty 2

## 2022-04-12 MED ORDER — ONDANSETRON HCL 4 MG/2ML IJ SOLN: 4.0000 mg | Freq: Once | INTRAMUSCULAR | Status: DC | PRN

## 2022-04-12 MED ORDER — DEXMEDETOMIDINE HCL IN NACL 80 MCG/20ML IV SOLN
INTRAVENOUS | Status: DC | PRN
  Administered 2022-04-12: 12 ug via BUCCAL

## 2022-04-12 MED ORDER — FLUCONAZOLE 150 MG PO TABS
150.0000 mg | ORAL_TABLET | Freq: Once | ORAL | Status: AC
  Administered 2022-04-12: 150 mg via ORAL
  Filled 2022-04-12: qty 1

## 2022-04-12 MED ORDER — STERILE WATER FOR INJECTION IJ SOLN
INTRAMUSCULAR | Status: DC | PRN
  Administered 2022-04-12: 2 mL via INTRAVENOUS

## 2022-04-12 MED ORDER — PROPOFOL 10 MG/ML IV BOLUS
INTRAVENOUS | Status: AC
  Filled 2022-04-12: qty 20

## 2022-04-12 MED ORDER — HYDROMORPHONE HCL 1 MG/ML IJ SOLN
0.2500 mg | INTRAMUSCULAR | Status: DC | PRN
  Administered 2022-04-12 (×2): 0.25 mg via INTRAVENOUS

## 2022-04-12 MED ORDER — FENTANYL CITRATE (PF) 100 MCG/2ML IJ SOLN
INTRAMUSCULAR | Status: DC | PRN
  Administered 2022-04-12 (×4): 50 ug via INTRAVENOUS

## 2022-04-12 MED ORDER — ESMOLOL HCL 100 MG/10ML IV SOLN
INTRAVENOUS | Status: DC | PRN
  Administered 2022-04-12: 20 mg via INTRAVENOUS
  Administered 2022-04-12: 30 mg via INTRAVENOUS

## 2022-04-12 MED ORDER — ROCURONIUM BROMIDE 10 MG/ML (PF) SYRINGE
PREFILLED_SYRINGE | INTRAVENOUS | Status: DC | PRN
  Administered 2022-04-12: 60 mg via INTRAVENOUS

## 2022-04-12 MED ORDER — BUPIVACAINE LIPOSOME 1.3 % IJ SUSP
INTRAMUSCULAR | Status: DC | PRN
  Administered 2022-04-12: 20 mL

## 2022-04-12 MED ORDER — HYDROMORPHONE HCL 1 MG/ML IJ SOLN
INTRAMUSCULAR | Status: AC
  Administered 2022-04-12: 0.5 mg via INTRAVENOUS
  Filled 2022-04-12: qty 1

## 2022-04-12 MED ORDER — DEXAMETHASONE SODIUM PHOSPHATE 10 MG/ML IJ SOLN
INTRAMUSCULAR | Status: AC
  Filled 2022-04-12: qty 1

## 2022-04-12 MED ORDER — PROPOFOL 10 MG/ML IV BOLUS
INTRAVENOUS | Status: DC | PRN
  Administered 2022-04-12: 80 mg via INTRAVENOUS
  Administered 2022-04-12: 30 mg via INTRAVENOUS
  Administered 2022-04-12: 40 mg via INTRAVENOUS
  Administered 2022-04-12: 20 mg via INTRAVENOUS

## 2022-04-12 MED ORDER — CLOTRIMAZOLE 1 % VA CREA
1.0000 | TOPICAL_CREAM | Freq: Every day | VAGINAL | Status: DC
  Administered 2022-04-12 – 2022-04-13 (×2): 1 via VAGINAL
  Filled 2022-04-12 (×2): qty 45

## 2022-04-12 MED ORDER — PHENYLEPHRINE 80 MCG/ML (10ML) SYRINGE FOR IV PUSH (FOR BLOOD PRESSURE SUPPORT)
PREFILLED_SYRINGE | INTRAVENOUS | Status: DC | PRN
  Administered 2022-04-12: 240 ug via INTRAVENOUS
  Administered 2022-04-12: 80 ug via INTRAVENOUS
  Administered 2022-04-12: 160 ug via INTRAVENOUS
  Administered 2022-04-12: 80 ug via INTRAVENOUS

## 2022-04-12 MED ORDER — BUPIVACAINE LIPOSOME 1.3 % IJ SUSP
INTRAMUSCULAR | Status: AC
  Filled 2022-04-12: qty 20

## 2022-04-12 MED ORDER — DEXAMETHASONE SODIUM PHOSPHATE 10 MG/ML IJ SOLN
INTRAMUSCULAR | Status: DC | PRN
  Administered 2022-04-12: 10 mg via INTRAVENOUS

## 2022-04-12 MED ORDER — ENOXAPARIN SODIUM 40 MG/0.4ML IJ SOSY: 40.0000 mg | PREFILLED_SYRINGE | INTRAMUSCULAR | Status: DC

## 2022-04-12 MED ORDER — 0.9 % SODIUM CHLORIDE (POUR BTL) OPTIME
TOPICAL | Status: DC | PRN
  Administered 2022-04-12: 1000 mL

## 2022-04-12 MED ORDER — SUGAMMADEX SODIUM 200 MG/2ML IV SOLN
INTRAVENOUS | Status: DC | PRN
  Administered 2022-04-12: 200 mg via INTRAVENOUS

## 2022-04-12 MED ORDER — LIDOCAINE 2% (20 MG/ML) 5 ML SYRINGE
INTRAMUSCULAR | Status: DC | PRN
  Administered 2022-04-12: 60 mg via INTRAVENOUS

## 2022-04-12 MED ORDER — LACTATED RINGERS IV SOLN: INTRAVENOUS | Status: DC

## 2022-04-12 MED ORDER — CHLORHEXIDINE GLUCONATE 0.12 % MT SOLN
15.0000 mL | Freq: Once | OROMUCOSAL | Status: AC
  Administered 2022-04-12: 15 mL via OROMUCOSAL

## 2022-04-12 SURGICAL SUPPLY — 38 items
ADH SKN CLS APL DERMABOND .7 (GAUZE/BANDAGES/DRESSINGS) ×1
APL PRP STRL LF DISP 70% ISPRP (MISCELLANEOUS) ×1
APPLIER CLIP 5 13 M/L LIGAMAX5 (MISCELLANEOUS) ×1
APR CLP MED LRG 5 ANG JAW (MISCELLANEOUS) ×1
BAG COUNTER SPONGE SURGICOUNT (BAG) IMPLANT
BAG SPNG CNTER NS LX DISP (BAG)
CABLE HIGH FREQUENCY MONO STRZ (ELECTRODE) ×1 IMPLANT
CHLORAPREP W/TINT 26 (MISCELLANEOUS) ×1 IMPLANT
CLIP APPLIE 5 13 M/L LIGAMAX5 (MISCELLANEOUS) ×1 IMPLANT
COVER MAYO STAND STRL (DRAPES) IMPLANT
DERMABOND ADVANCED .7 DNX12 (GAUZE/BANDAGES/DRESSINGS) ×1 IMPLANT
DRAPE C-ARM 42X120 X-RAY (DRAPES) IMPLANT
ELECT REM PT RETURN 15FT ADLT (MISCELLANEOUS) ×1 IMPLANT
GLOVE BIO SURGEON STRL SZ 6.5 (GLOVE) ×1 IMPLANT
GLOVE BIOGEL PI IND STRL 7.0 (GLOVE) ×1 IMPLANT
GLOVE INDICATOR 6.5 STRL GRN (GLOVE) ×1 IMPLANT
GOWN STRL REUS W/ TWL XL LVL3 (GOWN DISPOSABLE) ×3 IMPLANT
GOWN STRL REUS W/TWL XL LVL3 (GOWN DISPOSABLE) ×3
IRRIG SUCT STRYKERFLOW 2 WTIP (MISCELLANEOUS) ×1
IRRIGATION SUCT STRKRFLW 2 WTP (MISCELLANEOUS) ×1 IMPLANT
IV CATH 14GX2 1/4 (CATHETERS) IMPLANT
KIT BASIN OR (CUSTOM PROCEDURE TRAY) ×1 IMPLANT
KIT TURNOVER KIT A (KITS) IMPLANT
PENCIL SMOKE EVACUATOR (MISCELLANEOUS) IMPLANT
SCISSORS LAP 5X35 DISP (ENDOMECHANICALS) ×1 IMPLANT
SET CHOLANGIOGRAPH MIX (MISCELLANEOUS) IMPLANT
SET TUBE SMOKE EVAC HIGH FLOW (TUBING) ×1 IMPLANT
SLEEVE Z-THREAD 5X100MM (TROCAR) ×2 IMPLANT
SUT VIC AB 2-0 SH 27 (SUTURE) ×1
SUT VIC AB 2-0 SH 27X BRD (SUTURE) ×1 IMPLANT
SUT VIC AB 4-0 PS2 18 (SUTURE) ×1 IMPLANT
SYS BAG RETRIEVAL 10MM (BASKET) ×1
SYSTEM BAG RETRIEVAL 10MM (BASKET) ×1 IMPLANT
TOWEL OR 17X26 10 PK STRL BLUE (TOWEL DISPOSABLE) ×1 IMPLANT
TOWEL OR NON WOVEN STRL DISP B (DISPOSABLE) ×1 IMPLANT
TRAY LAPAROSCOPIC (CUSTOM PROCEDURE TRAY) ×1 IMPLANT
TROCAR BALLN 12MMX100 BLUNT (TROCAR) ×1 IMPLANT
TROCAR Z-THREAD OPTICAL 5X100M (TROCAR) ×1 IMPLANT

## 2022-04-12 NOTE — Anesthesia Procedure Notes (Signed)
Procedure Name: Intubation Date/Time: 04/12/2022 10:05 AM  Performed by: Lind Covert, CRNAPre-anesthesia Checklist: Patient identified, Emergency Drugs available, Suction available, Patient being monitored and Timeout performed Patient Re-evaluated:Patient Re-evaluated prior to induction Oxygen Delivery Method: Circle system utilized Preoxygenation: Pre-oxygenation with 100% oxygen Induction Type: IV induction Ventilation: Mask ventilation without difficulty Laryngoscope Size: Mac and 3 Grade View: Grade I Tube type: Oral Tube size: 7.0 mm Number of attempts: 1 Airway Equipment and Method: Stylet Placement Confirmation: positive ETCO2 and ETT inserted through vocal cords under direct vision Secured at: 20 cm Tube secured with: Tape Dental Injury: Teeth and Oropharynx as per pre-operative assessment

## 2022-04-12 NOTE — Progress Notes (Signed)
[facility] Progress Note  Day of Surgery  Subjective: CC-  No acute issues overnight  Objective: Vital signs in last 24 hours: Temp:  [97.5 F (36.4 C)-97.9 F (36.6 C)] 97.9 F (36.6 C) (09/27 0910) Pulse Rate:  [78-89] 78 (09/27 0910) Resp:  [16-17] 17 (09/27 0910) BP: (102-160)/(64-95) 160/84 (09/27 0910) SpO2:  [96 %-99 %] 99 % (09/27 0910) Weight:  [70.1 kg] 70.1 kg (09/27 0841) Last BM Date : 04/11/22  Intake/Output from previous day: 09/26 0701 - 09/27 0700 In: 1134.1 [P.O.:720; IV Piggyback:414.1] Out: 1150 [Urine:1100; Drains:50] Intake/Output this shift: No intake/output data recorded.  PE: Gen:  Alert, NAD, pleasant Resp: rate and effort normal Abd: soft, ND, mild RUQ TTP around drain, small amount of bilious fluid in bag  Lab Results:  Recent Labs    04/10/22 0540 04/12/22 0606  WBC 9.4 7.9  HGB 11.3* 11.7*  HCT 35.7* 37.8  PLT 294 312    BMET Recent Labs    04/10/22 0540 04/12/22 0606  NA 139 139  K 3.7 4.1  CL 109 109  CO2 22 23  GLUCOSE 78 81  BUN 15 17  CREATININE 0.90 0.79  CALCIUM 9.0 9.0    PT/INR Recent Labs    04/11/22 0625 04/12/22 0606  LABPROT 19.2* 18.2*  INR 1.6* 1.5*    CMP     Component Value Date/Time   NA 139 04/12/2022 0606   K 4.1 04/12/2022 0606   CL 109 04/12/2022 0606   CO2 23 04/12/2022 0606   GLUCOSE 81 04/12/2022 0606   BUN 17 04/12/2022 0606   CREATININE 0.79 04/12/2022 0606   CALCIUM 9.0 04/12/2022 0606   PROT 6.6 04/12/2022 0606   ALBUMIN 2.7 (L) 04/12/2022 0606   AST 15 04/12/2022 0606   ALT 14 04/12/2022 0606   ALKPHOS 47 04/12/2022 0606   BILITOT 0.5 04/12/2022 0606   GFRNONAA >60 04/12/2022 0606   GFRAA >60 11/11/2019 0518   Lipase     Component Value Date/Time   LIPASE 22 04/07/2022 1228       Studies/Results: No results found.  Anti-infectives: Anti-infectives (From admission, onward)    Start     Dose/Rate Route Frequency Ordered Stop   04/08/22 0000   [MAR Hold]  piperacillin-tazobactam (ZOSYN) IVPB 3.375 g        (MAR Hold since Wed 04/12/2022 at 0905.Hold Reason: Transfer to a Procedural area)   3.375 g 12.5 mL/hr over 240 Minutes Intravenous Every 8 hours 04/07/22 2044     04/07/22 1745  piperacillin-tazobactam (ZOSYN) IVPB 3.375 g        3.375 g 100 mL/hr over 30 Minutes Intravenous  Once 04/07/22 1740 04/07/22 1909        Assessment/Plan Acute on chronic cholecystitis, s/p perc chole tube 01/30/2022, dislodged 03/11/2022, replaced 03/28/2022.  - Pt back with symptoms of acute on chronic cholecystitis despite perc chole tube.  - Cards has seen 9/8 with intermediate risk of major cardiac event periop (6.6%).  - Multiple discussions with patient and family regarding risks of surgery. She understands the risks and has decided that she would like to proceed with lap chole this admission. Plan for surgery today. Continue to hold coumadin.    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    LOS: 4 days    Rosario Adie, MD  Colorectal [facility]

## 2022-04-12 NOTE — Discharge Instructions (Signed)
[facility]  Please arrive at least 30 min before your appointment to complete your check in paperwork.  If you are unable to arrive 30 min prior to your appointment time we may have to cancel or reschedule you. LAPAROSCOPIC SURGERY: POST OP INSTRUCTIONS Always review your discharge instruction sheet given to you by the facility where your surgery was performed. IF YOU HAVE DISABILITY OR FAMILY LEAVE FORMS, YOU MUST BRING THEM TO THE OFFICE FOR PROCESSING.   DO NOT GIVE THEM TO YOUR DOCTOR.  PAIN CONTROL  First take acetaminophen (Tylenol) AND/or ibuprofen (Advil) to control your pain after surgery.  Follow directions on package.  Taking acetaminophen (Tylenol) and/or ibuprofen (Advil) regularly after surgery will help to control your pain and lower the amount of prescription pain medication you may need.  You should not take more than 4,000 mg (4 grams) of acetaminophen (Tylenol) in 24 hours.  You should not take ibuprofen (Advil), aleve, motrin, naprosyn or other NSAIDS if you have a history of stomach ulcers or chronic kidney disease.  A prescription for pain medication may be given to you upon discharge.  Take your pain medication as prescribed, if you still have uncontrolled pain after taking acetaminophen (Tylenol) or ibuprofen (Advil). Use ice packs to help control pain. If you need a refill on your pain medication, please contact your pharmacy.  They will contact our office to request authorization. Prescriptions will not be filled after 5pm or on week-ends.  HOME MEDICATIONS Take your usually prescribed medications unless otherwise directed.  DIET You should follow a light diet the first few days after arrival home.  Be sure to include lots of fluids daily. Avoid fatty, fried foods.   CONSTIPATION It is common to experience some constipation after surgery and if you are taking pain medication.  Increasing fluid intake and taking a stool softener (such as Colace)  will usually help or prevent this problem from occurring.  A mild laxative (Milk of Magnesia or Miralax) should be taken according to package instructions if there are no bowel movements after 48 hours.  WOUND/INCISION CARE Most patients will experience some swelling and bruising in the area of the incisions.  Ice packs will help.  Swelling and bruising can take several days to resolve.  Unless discharge instructions indicate otherwise, follow guidelines below  STERI-STRIPS - you may remove your outer bandages 48 hours after surgery, and you may shower at that time.  You have steri-strips (small skin tapes) in place directly over the incision.  These strips should be left on the skin for 7-10 days.   DERMABOND/SKIN GLUE - you may shower in 24 hours.  The glue will flake off over the next 2-3 weeks. Any sutures or staples will be removed at the office during your follow-up visit.  ACTIVITIES You may resume regular (light) daily activities beginning the next day--such as daily self-care, walking, climbing stairs--gradually increasing activities as tolerated.  You may have sexual intercourse when it is comfortable.  Refrain from any heavy lifting or straining until approved by your doctor. You may drive when you are no longer taking prescription pain medication, you can comfortably wear a seatbelt, and you can safely maneuver your car and apply brakes.  FOLLOW-UP You should see your doctor in the office for a follow-up appointment approximately 2-3 weeks after your surgery.  You should have been given your post-op/follow-up appointment when your surgery was scheduled.  If you did not receive a post-op/follow-up appointment, make sure   that you call for this appointment within a day or two after you arrive home to insure a convenient appointment time.  OTHER INSTRUCTIONS  WHEN TO CALL YOUR DOCTOR: Fever over 101.0 Inability to urinate Continued bleeding from incision. Increased pain, redness, or  drainage from the incision. Increasing abdominal pain  The clinic staff is available to answer your questions during regular business hours.  Please don't hesitate to call and ask to speak to one of the nurses for clinical concerns.  If you have a medical emergency, go to the nearest emergency room or call 911.  A surgeon from [facility] is always on call at the hospital. [address], [address], [place], [place]  [address] ? [address] [address], [place], [place]   [address] [phone] ? [phone] ? FAX [phone]   

## 2022-04-12 NOTE — Transfer of Care (Signed)
Immediate Anesthesia Transfer of Care Note  Patient: Jill Crane  Procedure(s) Performed: LAPAROSCOPIC CHOLECYSTECTOMY WITH ICG (Abdomen)  Patient Location: PACU  Anesthesia Type:General  Level of Consciousness: sedated  Airway & Oxygen Therapy: Patient Spontanous Breathing and Patient connected to face mask oxygen  Post-op Assessment: Report given to RN and Post -op Vital signs reviewed and stable  Post vital signs: Reviewed and stable  Last Vitals:  Vitals Value Taken Time  BP 139/92 04/12/22 1157  Temp    Pulse 78 04/12/22 1200  Resp 24 04/12/22 1200  SpO2 100 % 04/12/22 1200  Vitals shown include unvalidated device data.  Last Pain:  Vitals:   04/12/22 0918  TempSrc:   PainSc: 0-No pain         Complications: No notable events documented.

## 2022-04-12 NOTE — Op Note (Signed)
04/07/2022 - 04/12/2022  11:46 AM  PATIENT:  Jill Crane  81 y.o. female  Patient Care Team: Zoila Shutter, MD as PCP - General (Internal Medicine) Tanja Port, RN as Registered Nurse  PRE-OPERATIVE DIAGNOSIS:  CHRONIC CHOLECYSTITIS  POST-OPERATIVE DIAGNOSIS:  chronic cholecystitis  PROCEDURE:   LAPAROSCOPIC CHOLECYSTECTOMY WITH ICG    Surgeon(s): Romie Levee, MD  ASSISTANT: Carl Best, PA  ANESTHESIA:   local and general  EBL:  No intake/output data recorded.  DRAINS: none   SPECIMEN:  Source of Specimen:  galbladder  DISPOSITION OF SPECIMEN:  PATHOLOGY  COUNTS:  YES  PLAN OF CARE:  Patient already admitted  PATIENT DISPOSITION:  PACU - hemodynamically stable.  INDICATION: 81 y.o. F with cholecystitis who has failed medical management by percutaneous cholecystostomy tube  The anatomy & physiology of hepatobiliary & pancreatic function was discussed.  The pathophysiology of gallbladder dysfunction was discussed.  Natural history risks without surgery was discussed.   I feel the risks of no intervention will lead to serious problems that outweigh the operative risks; therefore, I recommended cholecystectomy to remove the pathology.  I explained laparoscopic techniques with possible need for an open approach.  Probable cholangiogram to evaluate the bilary tract was explained as well.    Risks such as bleeding, infection, abscess, leak, injury to other organs, need for further treatment, heart attack, death, and other risks were discussed.  I noted a good likelihood this will help address the problem.  Possibility that this will not correct all abdominal symptoms was explained.  Goals of post-operative recovery were discussed as well.    OR FINDINGS: chronically inflamed gallbladder with cholecystostomy tube.  Dilated cystic duct  DESCRIPTION:   The patient was identified & brought into the operating room. The patient was positioned supine with arms tucked.  SCDs were active during the entire case. The patient underwent general anesthesia without any difficulty.  The abdomen was prepped and draped in a sterile fashion. A Surgical Timeout was performed and confirmed our plan.  We positioned the patient in reverse [place] & right side up.  I placed a Hassan laparoscopic port through the umbilicus using open entry technique.  Entry was clean. There were no adhesions to the anterior abdominal wall supraumbilically.  We induced carbon dioxide insufflation. Camera inspection revealed no injury.    I proceeded to continue with laparoscopic technique. I placed a 5 mm port in mid subcostal region, another 31mm port in the right flank near the anterior axillary line, and a 3mm port in the left subxiphoid region obliquely within the falciform ligament.  I turned attention to the right upper quadrant.  The omentum and duodenum were gently teased away from the gallbladder wall.  The gallbladder fundus was elevated cephalad. I used cautery and blunt dissection to free the peritoneal coverings between the gallbladder and the liver on the posteriolateral and anteriomedial walls.   I used careful blunt and cautery dissection with a blunt dissector to help get a good critical view of the cystic artery and cystic duct. I did further dissection to free a few centimeters of the  gallbladder off the liver bed to get a good critical view of the infundibulum and cystic duct. I mobilized the cystic artery.  I skeletonized the cystic duct.  This was very large.  The ICG dye was seen in the distal portion of the CBD.  I decided to take the gallbladder dome down just to make sure there were no hidden structures.  Once the gallbladder was separated from the liver bed, and after getting a good 360 view, I placed clips on the cystic artery x3 with 2 proximally.  I ligated the cystic artery using scissors. I then transected the cystic duct using scissors.  The cystic duct was then closed with  a endoclose suture.  I ensured hemostasis on the gallbladder fossa of the liver and elsewhere. I inspected the rest of the abdomen & detected no injury nor bleeding elsewhere.  I irrigated the RUQ with normal saline.  I removed the gallbladder through the umbilical port site.  I closed the umbilical fascia using 0 Vicryl stitches.   I closed the skin using 4-0 vicryl stitch.  Sterile dressings were applied. The patient was extubated & arrived in the PACU in stable condition.  I had discussed postoperative care with the patient in the holding area.  I will discuss  operative findings and postoperative goals / instructions with the patient's family.  Instructions are written in the chart as well.   Rosario Adie, MD  Colorectal and  Surgery

## 2022-04-12 NOTE — Anesthesia Preprocedure Evaluation (Addendum)
Anesthesia Evaluation  Patient identified by MRN, date of birth, ID band Patient awake    Reviewed: Allergy & Precautions, NPO status , Patient's Chart, lab work & pertinent test results, reviewed documented beta blocker date and time   Airway Mallampati: II  TM Distance: >3 FB Neck ROM: Full    Dental  (+) Edentulous Upper, Edentulous Lower   Pulmonary COPD, former smoker,    Pulmonary exam normal breath sounds clear to auscultation       Cardiovascular hypertension (160/84 preop), Pt. on medications and Pt. on home beta blockers +CHF (diastolic HF)  Normal cardiovascular exam+ dysrhythmias (coumadin LD 9/21) Atrial Fibrillation  Rhythm:Regular Rate:Normal  Echo 11/2021 Mild left ventricular hypertrophy  Left ventricular systolic function is normal.  LV ejection fraction = 60-65%.  There is mild tricuspid regurgitation.  No pulmonary hypertension.  There is no pericardial effusion.  Compared to the last study 2014, [facility] is noted     Neuro/Psych negative neurological ROS  negative psych ROS   GI/Hepatic Neg liver ROS, GERD  Medicated and Controlled,Chronic cholecystitis    Endo/Other  diabetes, Well Controlled, Type 2, Oral Hypoglycemic AgentsFS 94 preop   Renal/GU negative Renal ROS  negative genitourinary   Musculoskeletal Pemphigus- on chronic prednisone    Abdominal   Peds  Hematology negative hematology ROS (+)   Anesthesia Other Findings   Reproductive/Obstetrics negative OB ROS                            Anesthesia Physical Anesthesia Plan  ASA: 3  Anesthesia Plan: General   Post-op Pain Management: Tylenol PO (pre-op)*   Induction: Intravenous  PONV Risk Score and Plan: 4 or greater and Ondansetron, Dexamethasone and Treatment may vary due to age or medical condition  Airway Management Planned: Oral ETT  Additional Equipment: None  Intra-op Plan:   Post-operative  Plan: Extubation in OR  Informed Consent: I have reviewed the patients History and Physical, chart, labs and discussed the procedure including the risks, benefits and alternatives for the proposed anesthesia with the patient or authorized representative who has indicated his/her understanding and acceptance.     Dental advisory given  Plan Discussed with: CRNA  Anesthesia Plan Comments:         Anesthesia Quick Evaluation

## 2022-04-12 NOTE — Progress Notes (Signed)
PROGRESS NOTE    Jill Crane  WGN:[id number] DOB: [birth year] DOA: 04/07/2022 PCP: Burman Freestone, MD    Chief Complaint  Patient presents with   urine infection    Brief Narrative:  Jill Crane is an 81 y.o. 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  Had originally had perc chole tube placed in July due to recurrent cholecystitis.  One week prior to admission, tube was dislodged and replaced.    Returned due to In the ER, CT showed inflammation around the gallbladder, cholecystostomy tube in place.  Also small focus of intraperitoneal free air.   9/22: Admitted on antibiotics 9/23: Gen Surg consulted, unable to offer surgery over the weekend, recommended waiting for Monday team to evaluate 9/25: General Surgery discuss surgery with family, plan for Weds    Assessment & Plan:  Principal Problem:   Acute on chronic cholecystitis Active Problems:   Permanent atrial fibrillation (HCC)   Diabetes ([place])   Essential hypertension   Chronic diastolic CHF (congestive heart failure) (HCC)   GERD (gastroesophageal reflux disease)   History of pemphigus   History of laparoscopic cholecystectomy    Assessment and Plan: * Acute on chronic cholecystitis - Patient noted with acute on chronic cholecystitis.  -Patient seen by general surgery and with no continued improvement patient underwent laparoscopic cholecystectomy today per general surgery, Dr. Marcello Moores 04/12/2022.   -Continue empiric IV Zosyn.   -Continue to hold warfarin until cleared by general surgery to be resumed.   -Per general surgery.     History of pemphigus - Continue home regimen prednisone 7.5 mg daily.   GERD (gastroesophageal reflux disease) - PPI.  Chronic diastolic CHF (congestive heart failure) (HCC) -Currently euvolemic.   -Continue metoprolol.   -Continue to hold  furosemide.   Essential hypertension Blood pressure stable.   -Continue metoprolol.   -Continue to hold furosemide.   Diabetes ([place]) - Hemoglobin A1c 6.8 (01/31/2022) -CBG 94 this morning.   -Continue to hold oral hypoglycemic agents.   -SSI.    Permanent atrial fibrillation (HCC) Stable.  HR controlled. -Patient currently on MedSurg, just returned from surgery.  -Coumadin on hold, will defer to general surgery when may be resumed.  -Continue metoprolol for rate control.  -Place back on telemetry.           DVT prophylaxis: Lovenox Code Status: Full Family Communication: Updated patient, daughter at bedside. Disposition: Likely home with home health  Status is: Inpatient Remains inpatient appropriate because: Severity of illness   Consultants:  General surgery: Dr. Barry Dienes 04/08/2022  Procedures:  Laparoscopic cholecystectomy with ICG 04/12/2022 per Dr. Marcello Moores CT abdomen and pelvis 04/07/2022 CT angiogram chest 04/07/2022 Chest x-ray 04/07/2022  Antimicrobials:  IV Zosyn 04/07/2022>>>>>>    Subjective: Patient alert.  Laying in bed.  Just returned from the OR after laparoscopic cholecystectomy.  Daughter at bedside.  Patient denies any chest pain, no shortness of breath, no abdominal pain.  Objective: Vitals:   04/12/22 1238 04/12/22 1245 04/12/22 1300 04/12/22 1337  BP: (!) 153/103 (!) 147/98 (!) 143/83 (!) 153/98  Pulse: 92 98 81 85  Resp: 13 17 18 18   Temp:   98 F (36.7 C) 97.7 F (36.5 C)  TempSrc:    Oral  SpO2: 100% 100% 99% (!) 58%  Weight:      Height:        Intake/Output Summary (  Last 24 hours) at 04/12/2022 1539 Last data filed at 04/12/2022 1300 Gross per 24 hour  Intake 1854.07 ml  Output 550 ml  Net 1304.07 ml   Filed Weights   04/07/22 1107 04/08/22 0608 04/12/22 0841  Weight: 68 kg 70.1 kg 70.1 kg    Examination:  General exam: Appears calm and comfortable  Respiratory system: Clear to auscultation. Respiratory effort  normal. Cardiovascular system: S1 & S2 heard, RRR. No JVD, murmurs, rubs, gallops or clicks. No pedal edema. Gastrointestinal system: Abdomen is nondistended, soft and nontender. No organomegaly or masses felt. Normal bowel sounds heard.  Incision site c/d/I. Central nervous system: Alert and oriented. No focal neurological deficits. Extremities: Symmetric 5 x 5 power. Skin: No rashes, lesions or ulcers Psychiatry: Judgement and insight appear normal. Mood & affect appropriate.     Data Reviewed:   CBC: Recent Labs  Lab 04/07/22 1228 04/08/22 0624 04/09/22 0608 04/10/22 0540 04/12/22 0606  WBC 15.1* 12.7* 11.4* 9.4 7.9  NEUTROABS 12.5*  --   --   --   --   HGB 12.5 11.2* 11.8* 11.3* 11.7*  HCT 39.0 35.8* 38.1 35.7* 37.8  MCV 85.2 85.4 87.2 86.2 86.3  PLT 330 299 302 294 312    Basic Metabolic Panel: Recent Labs  Lab 04/07/22 1228 04/08/22 0624 04/09/22 0608 04/10/22 0540 04/12/22 0606  NA 141 140 138 139 139  K 4.9 4.2 4.1 3.7 4.1  CL 110 111 108 109 109  CO2 23 22 23 22 23   GLUCOSE 147* 95 85 78 81  BUN 15 12 12 15 17   CREATININE 0.93 0.88 0.94 0.90 0.79  CALCIUM 9.3 8.8* 8.9 9.0 9.0    GFR: Estimated Creatinine Clearance: 51.4 mL/min (by C-G formula based on SCr of 0.79 mg/dL).  Liver Function Tests: Recent Labs  Lab 04/07/22 1228 04/08/22 0624 04/09/22 0608 04/10/22 0540 04/12/22 0606  AST 26 13* 13* 12* 15  ALT 17 12 10 11 14   ALKPHOS 55 49 53 46 47  BILITOT 1.1 1.1 0.7 0.6 0.5  PROT 7.6 6.6 7.0 6.8 6.6  ALBUMIN 3.1* 2.6* 2.8* 2.6* 2.7*    CBG: Recent Labs  Lab 04/11/22 0733 04/11/22 1119 04/11/22 1632 04/12/22 0740 04/12/22 1210  GLUCAP 92 130* 142* 94 125*     No results found for this or any previous visit (from the past 240 hour(s)).       Radiology Studies: No results found.      Scheduled Meds:  brimonidine  1 drop Left Eye BID   busPIRone  10 mg Oral BID   dorzolamide  1 drop Left Eye BID   [START ON 04/13/2022]  enoxaparin (LOVENOX) injection  40 mg Subcutaneous Q24H   gabapentin  300 mg Oral BID   insulin aspart  0-9 Units Subcutaneous TID WC   latanoprost  1 drop Both Eyes QHS   metoprolol succinate  12.5 mg Oral Daily   pantoprazole  40 mg Oral Daily   predniSONE  7.5 mg Oral Daily   sodium chloride flush  3 mL Intravenous Q12H   Continuous Infusions:   LOS: 4 days    Time spent: 35 minutes    04/14/22, MD [facility]   To contact the attending provider between 7A-7P or the covering provider during after hours 7P-7A, please log into the web site [email or web address] and access using universal [place] password for that web site. If you do not have the password, please call the hospital operator.  04/12/2022, 3:39 PM

## 2022-04-12 NOTE — Telephone Encounter (Signed)
Patient has scheduled for appointment

## 2022-04-12 NOTE — Anesthesia Postprocedure Evaluation (Signed)
Anesthesia Post Note  Patient: Jill Crane  Procedure(s) Performed: LAPAROSCOPIC CHOLECYSTECTOMY WITH ICG (Abdomen)     Patient location during evaluation: PACU Anesthesia Type: General Level of consciousness: awake and alert, oriented and patient cooperative Pain management: pain level controlled Vital Signs Assessment: post-procedure vital signs reviewed and stable Respiratory status: spontaneous breathing, nonlabored ventilation and respiratory function stable Cardiovascular status: blood pressure returned to baseline and stable Postop Assessment: no apparent nausea or vomiting Anesthetic complications: no   No notable events documented.  Last Vitals: 143/83, P81 Afib, T 36.7, 99% on 2L [place]    Last Pain:  Vitals:   04/12/22 1337  TempSrc: Oral  PainSc:                  [name]

## 2022-04-12 NOTE — Progress Notes (Signed)
Waiting on green dye to come up from the pharmacy.  OR was ready earlier than expected and patient went back early.  Steve,CRNA states he will give the dye in the OR. Called the pharmacy and informed Jigna,pharmacist short stay did not need it now, will be given in the OR.

## 2022-04-13 ENCOUNTER — Encounter (HOSPITAL_COMMUNITY): Payer: Self-pay | Admitting: General Surgery

## 2022-04-13 DIAGNOSIS — D72829 Elevated white blood cell count, unspecified: Secondary | ICD-10-CM | POA: Clinically undetermined

## 2022-04-13 DIAGNOSIS — I10 Essential (primary) hypertension: Secondary | ICD-10-CM | POA: Diagnosis not present

## 2022-04-13 DIAGNOSIS — B3731 Acute candidiasis of vulva and vagina: Secondary | ICD-10-CM | POA: Diagnosis not present

## 2022-04-13 DIAGNOSIS — K219 Gastro-esophageal reflux disease without esophagitis: Secondary | ICD-10-CM | POA: Diagnosis not present

## 2022-04-13 DIAGNOSIS — K812 Acute cholecystitis with chronic cholecystitis: Secondary | ICD-10-CM | POA: Diagnosis not present

## 2022-04-13 DIAGNOSIS — E1165 Type 2 diabetes mellitus with hyperglycemia: Secondary | ICD-10-CM | POA: Diagnosis not present

## 2022-04-13 LAB — CBC
HCT: 36.9 % (ref 36.0–46.0)
Hemoglobin: 11.3 g/dL — ABNORMAL LOW (ref 12.0–15.0)
MCH: 26.5 pg (ref 26.0–34.0)
MCHC: 30.6 g/dL (ref 30.0–36.0)
MCV: 86.6 fL (ref 80.0–100.0)
Platelets: 279 10*3/uL (ref 150–400)
RBC: 4.26 MIL/uL (ref 3.87–5.11)
RDW: 18.2 % — ABNORMAL HIGH (ref 11.5–15.5)
WBC: 18.6 10*3/uL — ABNORMAL HIGH (ref 4.0–10.5)
nRBC: 0 % (ref 0.0–0.2)

## 2022-04-13 LAB — COMPREHENSIVE METABOLIC PANEL
ALT: 25 U/L (ref 0–44)
AST: 36 U/L (ref 15–41)
Albumin: 2.7 g/dL — ABNORMAL LOW (ref 3.5–5.0)
Alkaline Phosphatase: 49 U/L (ref 38–126)
Anion gap: 8 (ref 5–15)
BUN: 13 mg/dL (ref 8–23)
CO2: 23 mmol/L (ref 22–32)
Calcium: 9 mg/dL (ref 8.9–10.3)
Chloride: 108 mmol/L (ref 98–111)
Creatinine, Ser: 0.68 mg/dL (ref 0.44–1.00)
GFR, Estimated: >60 mL/min (ref >60)
Glucose, Bld: 104 mg/dL — ABNORMAL HIGH (ref 70–99)
Potassium: 4.7 mmol/L (ref 3.5–5.1)
Sodium: 139 mmol/L (ref 135–145)
Total Bilirubin: 0.6 mg/dL (ref 0.3–1.2)
Total Protein: 6.7 g/dL (ref 6.5–8.1)

## 2022-04-13 LAB — GLUCOSE, CAPILLARY
Glucose-Capillary: 107 mg/dL — ABNORMAL HIGH (ref 70–99)
Glucose-Capillary: 131 mg/dL — ABNORMAL HIGH (ref 70–99)
Glucose-Capillary: 140 mg/dL — ABNORMAL HIGH (ref 70–99)
Glucose-Capillary: 150 mg/dL — ABNORMAL HIGH (ref 70–99)

## 2022-04-13 LAB — PROTIME-INR
INR: 1.5 — ABNORMAL HIGH (ref 0.8–1.2)
Prothrombin Time: 17.5 seconds — ABNORMAL HIGH (ref 11.4–15.2)

## 2022-04-13 MED ORDER — WARFARIN SODIUM 4 MG PO TABS
4.0000 mg | ORAL_TABLET | Freq: Once | ORAL | Status: AC
  Administered 2022-04-13: 4 mg via ORAL
  Filled 2022-04-13: qty 1

## 2022-04-13 MED ORDER — ENOXAPARIN SODIUM 40 MG/0.4ML IJ SOSY
40.0000 mg | PREFILLED_SYRINGE | Freq: Every day | INTRAMUSCULAR | Status: DC
  Administered 2022-04-13 – 2022-04-14 (×2): 40 mg via SUBCUTANEOUS
  Filled 2022-04-13: qty 0.4

## 2022-04-13 MED ORDER — WARFARIN - PHARMACIST DOSING INPATIENT: Freq: Every day | Status: DC

## 2022-04-13 MED ORDER — HYDROMORPHONE HCL 1 MG/ML IJ SOLN: 0.5000 mg | INTRAMUSCULAR | Status: DC | PRN

## 2022-04-13 NOTE — Progress Notes (Signed)
[facility] for warfarin Indication: atrial fibrillation & hx DVT  No Known Allergies  Patient Measurements: Height: 5\' 2"  (157.5 cm) Weight: 70.1 kg (154 lb 8.7 oz) IBW/kg (Calculated) : 50.1 Heparin Dosing Weight:   Vital Signs: Temp: 98.1 F (36.7 C) (09/28 0549) Temp Source: Oral (09/28 0549) BP: 130/73 (09/28 0549) Pulse Rate: 97 (09/28 0549)  Labs: Recent Labs    04/11/22 0625 04/12/22 0606 04/13/22 0613  HGB  --  11.7* 11.3*  HCT  --  37.8 36.9  PLT  --  312 279  LABPROT 19.2* 18.2* 17.5*  INR 1.6* 1.5* 1.5*  CREATININE  --  0.79 0.68     Estimated Creatinine Clearance: 51.4 mL/min (by C-G formula based on SCr of 0.68 mg/dL).   PTA Medication Dose:  Warfarin 2 mg daily except 4 mg on Tuesdays and Thursdays   Assessment: Pharmacy is consulted to dose warfarin in 81 yo female with PMH of DVT & atrial fibrillation. Pt admitted with IAI. LD per med rec is 04/06/22  Today, 04/13/22  INR 1.5 subtherapeutic after held x 5 days last dose was 2 mg warfarin on 9/22 Hgb slightly low/stable, Plt WNL  S/p lap chole 9/27 No bleeding reported  Goal of Therapy:  INR 2-3 Monitor platelets by anticoagulation protocol: Yes   Plan:  LMWH 40 qday until INR>= 2 or pt discharged Warfarin 4 mg po x 1 dose today Continue daily INR  Monitor for signs and symptoms of bleeding   Thank you for allowing pharmacy to be a part of this patient's care.  Eudelia Bunch, Pharm.D 04/13/2022 10:22 AM

## 2022-04-13 NOTE — Progress Notes (Addendum)
PROGRESS NOTE    Jill Crane  IRS:[id number] DOB: May 08, 1941 DOA: 04/07/2022 PCP: Zoila Shutter, MD    Chief Complaint  Patient presents with   urine infection    Brief Narrative:  Jill Crane is an 81 y.o. 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  Had originally had perc chole tube placed in July due to recurrent cholecystitis.  One week prior to admission, tube was dislodged and replaced.    Returned due to In the ER, CT showed inflammation around the gallbladder, cholecystostomy tube in place.  Also small focus of intraperitoneal free air.   9/22: Admitted on antibiotics 9/23: Gen Surg consulted, unable to offer surgery over the weekend, recommended waiting for Monday team to evaluate 9/25: General Surgery discuss surgery with family, plan for Weds    Assessment & Plan:  Principal Problem:   Acute on chronic cholecystitis Active Problems:   Vaginal candidiasis   Permanent atrial fibrillation (HCC)   Diabetes (HCC)   Essential hypertension   Chronic diastolic CHF (congestive heart failure) (HCC)   GERD (gastroesophageal reflux disease)   History of pemphigus   History of laparoscopic cholecystectomy   Leukocytosis    Assessment and Plan: * Acute on chronic cholecystitis - Patient noted with acute on chronic cholecystitis.  -Patient seen by general surgery and with no continued improvement early on in the hospitalization and subsequently patient underwent laparoscopic cholecystectomy per general surgery, Dr. Maisie Fus 04/12/2022.   -IV antibiotics of Zosyn have been discontinued. -Diet advanced from full liquid diet to carb modified diet per general surgery. -Supportive care. -Per general surgery.    Vaginal candidiasis - Patient noted with complaints of itching/vaginal candidiasis 04/12/2022. -Status post Diflucan 150 mg  p.o. x1. -Continue clotrimazole vaginal cream nightly x6 more days.  Leukocytosis - Patient noted with a leukocytosis today likely reactive leukocytosis postoperatively. -Patient with no respiratory symptoms, no dysuria, afebrile. -Patient was on IV Zosyn which was discontinued 04/12/2022. -Follow for now.  History of pemphigus - Continue home regimen prednisone 7.5 mg daily.   GERD (gastroesophageal reflux disease) - Continue PPI.  Chronic diastolic CHF (congestive heart failure) (HCC) -Currently euvolemic.   -Continue metoprolol.   -Continue to hold furosemide.   Essential hypertension Blood pressure stable.   -Continue metoprolol.   -Continue to hold furosemide.   Diabetes (HCC) - Hemoglobin A1c 6.8 (01/31/2022) -CBG 107 this morning.   -Continue to hold oral hypoglycemic agents.   -SSI.    Permanent atrial fibrillation (HCC) Stable.  HR controlled. -Patient placed back on telemetry.   -Continue metoprolol for rate control.   -Coumadin resumed today, okayed per general surgery.          DVT prophylaxis: Lovenox Code Status: Full Family Communication: Updated patient, daughter at bedside. Disposition: Likely home with home health  Status is: Inpatient Remains inpatient appropriate because: Severity of illness   Consultants:  General surgery: Dr. Donell Beers 04/08/2022  Procedures:  Laparoscopic cholecystectomy with ICG 04/12/2022 per Dr. Maisie Fus CT abdomen and pelvis 04/07/2022 CT angiogram chest 04/07/2022 Chest x-ray 04/07/2022  Antimicrobials:  IV Zosyn 04/07/2022>>>>>> 04/12/2022    Subjective: Laying in bed.  Daughter at bedside.  Denies any chest pain.  No shortness of breath.  States had bowel movement this morning.  Some complaints of abdominal discomfort but no significant abdominal pain as on admission.  Tolerating current diet.  States some  improvement with vaginal itching after being started on clotrimazole cream and oral Diflucan yesterday.  Asking  when she is going to be able to go home.    Objective: Vitals:   04/12/22 1337 04/12/22 2059 04/12/22 2106 04/13/22 0549  BP: (!) 153/98 (!) 150/68  130/73  Pulse: 85 88  97  Resp: 18 18  17   Temp: 97.7 F (36.5 C) (!) 97.5 F (36.4 C) 97.6 F (36.4 C) 98.1 F (36.7 C)  TempSrc: Oral Oral Oral Oral  SpO2: (!) 58% 99%  96%  Weight:      Height:        Intake/Output Summary (Last 24 hours) at 04/13/2022 1743 Last data filed at 04/13/2022 1401 Gross per 24 hour  Intake 406 ml  Output --  Net 406 ml   Filed Weights   04/07/22 1107 04/08/22 0608 04/12/22 0841  Weight: 68 kg 70.1 kg 70.1 kg    Examination:  General exam: NAD. Respiratory system: Lungs clear to auscultation bilaterally.  No wheezes, no crackles, no rhonchi.  Fair air movement.  Speaking in full sentences.  Cardiovascular system: Irregularly irregular.  No murmurs rubs or gallops.  No JVD.  No lower extremity edema.  Gastrointestinal system: Abdomen is soft, nontender, nondistended, positive bowel sounds.  No rebound.  No guarding.  Incision site c/d/I. Central nervous system: Alert and oriented. No focal neurological deficits. Extremities: Symmetric 5 x 5 power. Skin: No rashes, lesions or ulcers Psychiatry: Judgement and insight appear normal. Mood & affect appropriate.     Data Reviewed:   CBC: Recent Labs  Lab 04/07/22 1228 04/08/22 9449 04/09/22 6759 04/10/22 0540 04/12/22 0606 04/13/22 0613  WBC 15.1* 12.7* 11.4* 9.4 7.9 18.6*  NEUTROABS 12.5*  --   --   --   --   --   HGB 12.5 11.2* 11.8* 11.3* 11.7* 11.3*  HCT 39.0 35.8* 38.1 35.7* 37.8 36.9  MCV 85.2 85.4 87.2 86.2 86.3 86.6  PLT 330 299 302 294 312 163    Basic Metabolic Panel: Recent Labs  Lab 04/08/22 0624 04/09/22 0608 04/10/22 0540 04/12/22 0606 04/13/22 0613  NA 140 138 139 139 139  K 4.2 4.1 3.7 4.1 4.7  CL 111 108 109 109 108  CO2 22 23 22 23 23   GLUCOSE 95 85 78 81 104*  BUN 12 12 15 17 13   CREATININE 0.88 0.94 0.90  0.79 0.68  CALCIUM 8.8* 8.9 9.0 9.0 9.0    GFR: Estimated Creatinine Clearance: 51.4 mL/min (by C-G formula based on SCr of 0.68 mg/dL).  Liver Function Tests: Recent Labs  Lab 04/08/22 0624 04/09/22 0608 04/10/22 0540 04/12/22 0606 04/13/22 0613  AST 13* 13* 12* 15 36  ALT 12 10 11 14 25   ALKPHOS 49 53 46 47 49  BILITOT 1.1 0.7 0.6 0.5 0.6  PROT 6.6 7.0 6.8 6.6 6.7  ALBUMIN 2.6* 2.8* 2.6* 2.7* 2.7*    CBG: Recent Labs  Lab 04/12/22 1625 04/12/22 2108 04/13/22 0740 04/13/22 1145 04/13/22 1642  GLUCAP 272* 265* 107* 131* 140*     No results found for this or any previous visit (from the past 240 hour(s)).       Radiology Studies: No results found.      Scheduled Meds:  brimonidine  1 drop Left Eye BID   busPIRone  10 mg Oral BID   clotrimazole  1 Applicatorful Vaginal QHS   dorzolamide  1 drop Left Eye BID   enoxaparin (LOVENOX) injection  40  mg Subcutaneous Daily   gabapentin  300 mg Oral BID   insulin aspart  0-9 Units Subcutaneous TID WC   latanoprost  1 drop Both Eyes QHS   metoprolol succinate  12.5 mg Oral Daily   pantoprazole  40 mg Oral Daily   predniSONE  7.5 mg Oral Daily   sodium chloride flush  3 mL Intravenous Q12H   Warfarin - Pharmacist Dosing Inpatient   Does not apply q1600   Continuous Infusions:   LOS: 5 days    Time spent: 35 minutes    Ramiro Harvest, MD [facility]   To contact the attending provider between 7A-7P or the covering provider during after hours 7P-7A, please log into the web site [email or web address] and access using universal [place] password for that web site. If you do not have the password, please call the hospital operator.  04/13/2022, 5:43 PM

## 2022-04-13 NOTE — Assessment & Plan Note (Addendum)
-   Patient noted with complaints of itching/vaginal candidiasis 04/12/2022. -Status post Diflucan 150 mg p.o. x1. -Patient started on clotrimazole vaginal cream nightly and will be discharged on 5 more days on days.

## 2022-04-13 NOTE — Progress Notes (Signed)
[facility] Progress Note  1 Day Post-Op  Subjective: CC-  Abdomen sore but no significant pain. She just got some pain medication that seems to be helping. Denies n/v. Tolerating clear liquids. Passing flatus, no BM. Feels hungry. States that she has already ambulated since surgery and that went well. She got some diflucan over night for a yeast infection that was developing.  Objective: Vital signs in last 24 hours: Temp:  [97.5 F (36.4 C)-98.1 F (36.7 C)] 98.1 F (36.7 C) (09/28 0549) Pulse Rate:  [63-106] 97 (09/28 0549) Resp:  [11-20] 17 (09/28 0549) BP: (130-153)/(68-107) 130/73 (09/28 0549) SpO2:  [58 %-100 %] 96 % (09/28 0549) Last BM Date : 04/12/22  Intake/Output from previous day: 09/27 0701 - 09/28 0700 In: 1200 [I.V.:1200] Out: -  Intake/Output this shift: No intake/output data recorded.  PE: Gen:  Alert, NAD, pleasant Abd: soft, protuberant, nontender, lap incisions cdi without erythema or drainage  Lab Results:  Recent Labs    04/12/22 0606 04/13/22 0613  WBC 7.9 18.6*  HGB 11.7* 11.3*  HCT 37.8 36.9  PLT 312 279   BMET Recent Labs    04/12/22 0606 04/13/22 0613  NA 139 139  K 4.1 4.7  CL 109 108  CO2 23 23  GLUCOSE 81 104*  BUN 17 13  CREATININE 0.79 0.68  CALCIUM 9.0 9.0   PT/INR Recent Labs    04/12/22 0606 04/13/22 0613  LABPROT 18.2* 17.5*  INR 1.5* 1.5*   CMP     Component Value Date/Time   NA 139 04/13/2022 0613   K 4.7 04/13/2022 0613   CL 108 04/13/2022 0613   CO2 23 04/13/2022 0613   GLUCOSE 104 (H) 04/13/2022 0613   BUN 13 04/13/2022 0613   CREATININE 0.68 04/13/2022 0613   CALCIUM 9.0 04/13/2022 0613   PROT 6.7 04/13/2022 0613   ALBUMIN 2.7 (L) 04/13/2022 0613   AST 36 04/13/2022 0613   ALT 25 04/13/2022 0613   ALKPHOS 49 04/13/2022 0613   BILITOT 0.6 04/13/2022 0613   GFRNONAA >60 04/13/2022 0613   GFRAA >60 11/11/2019 0518   Lipase     Component Value Date/Time   LIPASE 22 04/07/2022  1228       Studies/Results: No results found.  Anti-infectives: Anti-infectives (From admission, onward)    Start     Dose/Rate Route Frequency Ordered Stop   04/12/22 2000  fluconazole (DIFLUCAN) tablet 150 mg        150 mg Oral  Once 04/12/22 1905 04/12/22 2059   04/08/22 0000  piperacillin-tazobactam (ZOSYN) IVPB 3.375 g  Status:  Discontinued        3.375 g 12.5 mL/hr over 240 Minutes Intravenous Every 8 hours 04/07/22 2044 04/12/22 1409   04/07/22 1745  piperacillin-tazobactam (ZOSYN) IVPB 3.375 g        3.375 g 100 mL/hr over 30 Minutes Intravenous  Once 04/07/22 1740 04/07/22 1909        Assessment/Plan Acute on chronic cholecystitis, s/p perc chole tube 01/30/2022, dislodged 03/11/2022, replaced 03/28/2022.  -POD#1 s/p laparoscopic cholecystectomy 9/27 Dr. Maisie Fus - Advance to carb mod diet. Mobilize, PT consult pending. Ok to restart coumadin today from our standpoint. She does not need any more antibiotics.   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

## 2022-04-13 NOTE — Assessment & Plan Note (Addendum)
-   Patient noted with a leukocytosis felt likely reactive leukocytosis postoperatively.  -Patient noted to remain afebrile, no respiratory symptoms, no urinary symptoms.  -Patient was on IV Zosyn which was discontinued postoperatively on 04/12/2022.  -Leukocytosis trended down, patient cleared by general surgery for discharge.  -Outpatient follow-up with PCP.

## 2022-04-14 DIAGNOSIS — B3731 Acute candidiasis of vulva and vagina: Secondary | ICD-10-CM

## 2022-04-14 DIAGNOSIS — K812 Acute cholecystitis with chronic cholecystitis: Secondary | ICD-10-CM | POA: Diagnosis not present

## 2022-04-14 DIAGNOSIS — D72829 Elevated white blood cell count, unspecified: Secondary | ICD-10-CM

## 2022-04-14 DIAGNOSIS — I10 Essential (primary) hypertension: Secondary | ICD-10-CM | POA: Diagnosis not present

## 2022-04-14 DIAGNOSIS — I5032 Chronic diastolic (congestive) heart failure: Secondary | ICD-10-CM | POA: Diagnosis not present

## 2022-04-14 DIAGNOSIS — K219 Gastro-esophageal reflux disease without esophagitis: Secondary | ICD-10-CM | POA: Diagnosis not present

## 2022-04-14 LAB — CBC WITH DIFFERENTIAL/PLATELET
Abs Immature Granulocytes: 0.26 K/uL — ABNORMAL HIGH (ref 0.00–0.07)
Basophils Absolute: 0.1 K/uL (ref 0.0–0.1)
Basophils Relative: 1 %
Eosinophils Absolute: 0.1 K/uL (ref 0.0–0.5)
Eosinophils Relative: 0 %
HCT: 37.2 % (ref 36.0–46.0)
Hemoglobin: 11.7 g/dL — ABNORMAL LOW (ref 12.0–15.0)
Immature Granulocytes: 2 %
Lymphocytes Relative: 16 %
Lymphs Abs: 2.7 K/uL (ref 0.7–4.0)
MCH: 27.1 pg (ref 26.0–34.0)
MCHC: 31.5 g/dL (ref 30.0–36.0)
MCV: 86.1 fL (ref 80.0–100.0)
Monocytes Absolute: 1.3 K/uL — ABNORMAL HIGH (ref 0.1–1.0)
Monocytes Relative: 8 %
Neutro Abs: 12.5 K/uL — ABNORMAL HIGH (ref 1.7–7.7)
Neutrophils Relative %: 73 %
Platelets: 280 K/uL (ref 150–400)
RBC: 4.32 MIL/uL (ref 3.87–5.11)
RDW: 18.6 % — ABNORMAL HIGH (ref 11.5–15.5)
WBC: 16.8 K/uL — ABNORMAL HIGH (ref 4.0–10.5)
nRBC: 0 % (ref 0.0–0.2)

## 2022-04-14 LAB — COMPREHENSIVE METABOLIC PANEL
ALT: 26 U/L (ref 0–44)
AST: 38 U/L (ref 15–41)
Albumin: 2.8 g/dL — ABNORMAL LOW (ref 3.5–5.0)
Alkaline Phosphatase: 57 U/L (ref 38–126)
Anion gap: 10 (ref 5–15)
BUN: 18 mg/dL (ref 8–23)
CO2: 23 mmol/L (ref 22–32)
Calcium: 9.4 mg/dL (ref 8.9–10.3)
Chloride: 106 mmol/L (ref 98–111)
Creatinine, Ser: 0.85 mg/dL (ref 0.44–1.00)
GFR, Estimated: >60 mL/min (ref >60)
Glucose, Bld: 85 mg/dL (ref 70–99)
Potassium: 4.5 mmol/L (ref 3.5–5.1)
Sodium: 139 mmol/L (ref 135–145)
Total Bilirubin: 0.8 mg/dL (ref 0.3–1.2)
Total Protein: 7.2 g/dL (ref 6.5–8.1)

## 2022-04-14 LAB — PROTIME-INR
INR: 1.4 — ABNORMAL HIGH (ref 0.8–1.2)
Prothrombin Time: 16.8 seconds — ABNORMAL HIGH (ref 11.4–15.2)

## 2022-04-14 LAB — GLUCOSE, CAPILLARY
Glucose-Capillary: 87 mg/dL (ref 70–99)
Glucose-Capillary: 126 mg/dL — ABNORMAL HIGH (ref 70–99)
Glucose-Capillary: 178 mg/dL — ABNORMAL HIGH (ref 70–99)

## 2022-04-14 LAB — MAGNESIUM: Magnesium: 2 mg/dL (ref 1.7–2.4)

## 2022-04-14 MED ORDER — SALINE SPRAY 0.65 % NA SOLN: 1.0000 | NASAL | Status: DC | PRN

## 2022-04-14 MED ORDER — WARFARIN SODIUM 4 MG PO TABS
4.0000 mg | ORAL_TABLET | Freq: Once | ORAL | Status: AC
  Administered 2022-04-14: 4 mg via ORAL
  Filled 2022-04-14: qty 1

## 2022-04-14 MED ORDER — CLOTRIMAZOLE 1 % VA CREA: 1.0000 | TOPICAL_CREAM | Freq: Every day | VAGINAL | 0 refills | Status: AC

## 2022-04-14 MED ORDER — HYDROCODONE-ACETAMINOPHEN 5-325 MG PO TABS: 1.0000 | ORAL_TABLET | Freq: Four times a day (QID) | ORAL | 0 refills | Status: DC | PRN

## 2022-04-14 NOTE — Evaluation (Signed)
Physical Therapy Evaluation Patient Details Name: Jill Crane MRN: [id number] DOB: 1940/11/13 Today's Date: 04/14/2022  History of Present Illness  81 y.o. 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 Patient admiited for Acute on chronic cholecystitis an ds/p lap choley 9/27  Clinical Impression  Pt admitted with above diagnosis.  Pt currently with functional limitations due to the deficits listed below (see PT Problem List). Pt will benefit from skilled PT to increase their independence and safety with mobility to allow discharge to the venue listed below.  Pt assisted to bathroom and then ambulated in hallway.  Pt holding objects in room and utilized RW for ambulating in hallway.  Recommended pt use RW upon d/c home.  Pt's daughter present and reports pt has been in afib and anticipating getting pacemaker at some point once pt's other medical issues have been addressed.  Notified MD that pt's HR up to 149 bpm during ambulation.  Pt and daughter agreeable to HHPT upon d/c.        Recommendations for follow up therapy are one component of a multi-disciplinary discharge planning process, led by the attending physician.  Recommendations may be updated based on patient status, additional functional criteria and insurance authorization.  Follow Up Recommendations Home health PT      Assistance Recommended at Discharge Intermittent Supervision/Assistance  Patient can return home with the following  A little help with walking and/or transfers;A little help with bathing/dressing/bathroom;Help with stairs or ramp for entrance;Assistance with cooking/housework;Assist for transportation    Equipment Recommendations None recommended by PT  Recommendations for Other Services       Functional Status Assessment Patient has had a recent decline in their  functional status and demonstrates the ability to make significant improvements in function in a reasonable and predictable amount of time.     Precautions / Restrictions Precautions Precautions: Fall Precaution Comments: monitor HR Restrictions Weight Bearing Restrictions: No      Mobility  Bed Mobility Overal bed mobility: Modified Independent                  Transfers Overall transfer level: Needs assistance Equipment used: None Transfers: Sit to/from Stand Sit to Stand: Supervision           General transfer comment: supervision for safety; pt reports dizziness and abruptly returned to sitting, HR 130 bpm in afib per telemetry monitor, HR returned to upper 90s and pt stood again    Ambulation/Gait Ambulation/Gait assistance: Min guard Gait Distance (Feet): 200 Feet Assistive device: Rolling walker (2 wheels) Gait Pattern/deviations: Step-through pattern, Decreased stride length Gait velocity: decr     General Gait Details: verbal cues for RW positioning, HR up to 149 bpm during ambulation (MD notified); pt reports she can tell when HR is elevated  Stairs            Wheelchair Mobility    Modified Rankin (Stroke Patients Only)       Balance Overall balance assessment: Mild deficits observed, not formally tested                                           Pertinent Vitals/Pain Pain Assessment Pain Assessment: Faces Faces Pain Scale: Hurts a little bit Pain Location: abdomen Pain Descriptors / Indicators: Sore  Pain Intervention(s): Repositioned, Monitored during session    Home Living Family/patient expects to be discharged to:: Private residence Living Arrangements: Other relatives (granddaughter) Available Help at Discharge: Family;Available PRN/intermittently Type of Home: House Home Access: Stairs to enter Entrance Stairs-Rails: Right Entrance Stairs-Number of Steps: 3   Home Layout: One level Home Equipment:  Agricultural consultant (2 wheels)      Prior Function Prior Level of Function : Independent/Modified Independent             Mobility Comments: does not drive, used no DME ADLs Comments: independent     Hand Dominance   Dominant Hand: Right    Extremity/Trunk Assessment   Upper Extremity Assessment Upper Extremity Assessment: Overall WFL for tasks assessed    Lower Extremity Assessment Lower Extremity Assessment: Generalized weakness    Cervical / Trunk Assessment Cervical / Trunk Assessment: Normal  Communication   Communication: Expressive difficulties (difficult to understand at times, garbled speech)  Cognition Arousal/Alertness: Awake/alert Behavior During Therapy: WFL for tasks assessed/performed Overall Cognitive Status: Within Functional Limits for tasks assessed                                          General Comments      Exercises     Assessment/Plan    PT Assessment Patient needs continued PT services  PT Problem List Decreased strength;Decreased activity tolerance;Decreased balance;Decreased mobility;Decreased knowledge of use of DME;Cardiopulmonary status limiting activity       PT Treatment Interventions DME instruction;Gait training;Balance training;Therapeutic exercise;Functional mobility training;Therapeutic activities;Patient/family education;Stair training    PT Goals (Current goals can be found in the Care Plan section)  Acute Rehab PT Goals PT Goal Formulation: With patient/family Time For Goal Achievement: 04/28/22 Potential to Achieve Goals: Good    Frequency Min 3X/week     Co-evaluation               AM-PAC PT "6 Clicks" Mobility  Outcome Measure Help needed turning from your back to your side while in a flat bed without using bedrails?: A Little Help needed moving from lying on your back to sitting on the side of a flat bed without using bedrails?: A Little Help needed moving to and from a bed to a chair  (including a wheelchair)?: A Little Help needed standing up from a chair using your arms (e.g., wheelchair or bedside chair)?: A Little Help needed to walk in hospital room?: A Little Help needed climbing 3-5 steps with a railing? : A Lot 6 Click Score: 17    End of Session Equipment Utilized During Treatment: Gait belt Activity Tolerance: Patient tolerated treatment well Patient left: in bed;with call bell/phone within reach;with family/visitor present   PT Visit Diagnosis: Difficulty in walking, not elsewhere classified (R26.2)    Time: 0938-1829 PT Time Calculation (min) (ACUTE ONLY): 13 min   Charges:   PT Evaluation $PT Eval Low Complexity: 1 Low         Kati PT, DPT Physical Therapist Acute Rehabilitation Services Preferred contact method: Secure Chat Weekend Pager Only: [phone] Office: [phone]   Janan Halter [place] 04/14/2022, 1:27 PM

## 2022-04-14 NOTE — Progress Notes (Signed)
[facility] Progress Note  2 Days Post-Op  Subjective: CC-  Abdomen still a little sore but pain well controlled. Denies n/v. Tolerating diet. States that she did ambulate on the floor yesterday.  Objective: Vital signs in last 24 hours: Temp:  [98 F (36.7 C)-98.6 F (37 C)] 98.6 F (37 C) (09/29 0506) Pulse Rate:  [58-86] 86 (09/29 0506) Resp:  [14-18] 14 (09/29 0506) BP: (103-132)/(73-77) 132/73 (09/29 0506) SpO2:  [90 %-95 %] 90 % (09/29 0506) Last BM Date : 04/12/22  Intake/Output from previous day: 09/28 0701 - 09/29 0700 In: 409 [P.O.:406; I.V.:3] Out: -  Intake/Output this shift: No intake/output data recorded.  PE: Gen:  Alert, NAD, pleasant Abd: soft, protuberant, nontender, lap incisions cdi without erythema or drainage  Lab Results:  Recent Labs    04/13/22 0613 04/14/22 0633  WBC 18.6* 16.8*  HGB 11.3* 11.7*  HCT 36.9 37.2  PLT 279 280   BMET Recent Labs    04/13/22 0613 04/14/22 0633  NA 139 139  K 4.7 4.5  CL 108 106  CO2 23 23  GLUCOSE 104* 85  BUN 13 18  CREATININE 0.68 0.85  CALCIUM 9.0 9.4   PT/INR Recent Labs    04/13/22 0613 04/14/22 0633  LABPROT 17.5* 16.8*  INR 1.5* 1.4*   CMP     Component Value Date/Time   NA 139 04/14/2022 0633   K 4.5 04/14/2022 0633   CL 106 04/14/2022 0633   CO2 23 04/14/2022 0633   GLUCOSE 85 04/14/2022 0633   BUN 18 04/14/2022 0633   CREATININE 0.85 04/14/2022 0633   CALCIUM 9.4 04/14/2022 0633   PROT 7.2 04/14/2022 0633   ALBUMIN 2.8 (L) 04/14/2022 0633   AST 38 04/14/2022 0633   ALT 26 04/14/2022 0633   ALKPHOS 57 04/14/2022 0633   BILITOT 0.8 04/14/2022 0633   GFRNONAA >60 04/14/2022 0633   GFRAA >60 11/11/2019 0518   Lipase     Component Value Date/Time   LIPASE 22 04/07/2022 1228       Studies/Results: No results found.  Anti-infectives: Anti-infectives (From admission, onward)    Start     Dose/Rate Route Frequency Ordered Stop   04/12/22 2000   fluconazole (DIFLUCAN) tablet 150 mg        150 mg Oral  Once 04/12/22 1905 04/12/22 2059   04/08/22 0000  piperacillin-tazobactam (ZOSYN) IVPB 3.375 g  Status:  Discontinued        3.375 g 12.5 mL/hr over 240 Minutes Intravenous Every 8 hours 04/07/22 2044 04/12/22 1409   04/07/22 1745  piperacillin-tazobactam (ZOSYN) IVPB 3.375 g        3.375 g 100 mL/hr over 30 Minutes Intravenous  Once 04/07/22 1740 04/07/22 1909        Assessment/Plan Acute on chronic cholecystitis, s/p perc chole tube 01/30/2022, dislodged 03/11/2022, replaced 03/28/2022.  -POD#2 s/p laparoscopic cholecystectomy 9/27 Dr. Marcello Moores - Doing well from surgery. [place] for discharge from our standpoint. Discharge instructions and follow up info on AVS. Rx for norco sent to pharmacy. We will sign off, please call with questions or concerns.   ID - zosyn 9/22>>9/27 FEN - CM diet VTE - SCDs, lovenox, INR 1.4, restarted coumadin 9/28 Foley - none   Permanent A fib with tachy/brady Hx DVT on coumadin Chronic diastolic CHF Pemphigus on steroids DM COPD HTN PAD    LOS: 6 days    Wellington [place], [facility] 04/14/2022, 8:23 AM Please see Amion  for pager number during day hours 7:00am-4:30pm

## 2022-04-14 NOTE — Progress Notes (Signed)
Patient was discharged to home, AVS reviewed with her and her daughter. All questions answered. Patient's daughter verified prescriptions were sent to the correct pharmacy. IV removed. NT assisted patient to the exit via wheelchair, patient's daughter provided transportation

## 2022-04-14 NOTE — Evaluation (Signed)
Occupational Therapy Evaluation Patient Details Name: Jill Crane MRN: [id number] DOB: January 16, 1941 Today's Date: 04/14/2022   History of Present Illness 81 y.o. 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 Patient admiited for Acute on chronic cholecystitis an ds/p lap choley 9/27   Clinical Impression   Jill Crane is an 81 year old woman s/p lap choley with abdominal soreness. On evaluation she demonstrates ability to perform functional mobility and ADLs. She needs increased time or altered positioning secondary to abdominal pain. Therapist educated patient on daughter on  potential need for shower chair, to maintain ambulation and limit pushing, pulling and lifting to protect surgical site. They verbalize understanding. Patient has intermittent assistance during the day and someone stays with her at night. Patient has no further OT needs.      Recommendations for follow up therapy are one component of a multi-disciplinary discharge planning process, led by the attending physician.  Recommendations may be updated based on patient status, additional functional criteria and insurance authorization.   Follow Up Recommendations  No OT follow up    Assistance Recommended at Discharge Intermittent Supervision/Assistance  Patient can return home with the following Assistance with cooking/housework    Functional Status Assessment  Patient has had a recent decline in their functional status and demonstrates the ability to make significant improvements in function in a reasonable and predictable amount of time.  Equipment Recommendations  None recommended by OT    Recommendations for Other Services       Precautions / Restrictions Precautions Precautions: Fall Restrictions Weight Bearing Restrictions: No      Mobility Bed  Mobility Overal bed mobility: Modified Independent                  Transfers Overall transfer level: Needs assistance Equipment used: None   Sit to Stand: Supervision           General transfer comment: Supervision for in room ambulation without a device. Intermittent hand holds on furniture but no overt loss of balance.      Balance Overall balance assessment: Mild deficits observed, not formally tested                                         ADL either performed or assessed with clinical judgement   ADL Overall ADL's : Modified independent                                       General ADL Comments: Able to perform ADLs with increased time or altered positioning secondary to soreness/ pain     Vision   Vision Assessment?: No apparent visual deficits     Perception     Praxis      Pertinent Vitals/Pain Pain Assessment Pain Assessment: Faces Faces Pain Scale: Hurts a little bit Pain Location: abdomen Pain Descriptors / Indicators: Sore Pain Intervention(s): Monitored during session     Hand Dominance Right   Extremity/Trunk Assessment Upper Extremity Assessment Upper Extremity Assessment: Overall WFL for tasks assessed   Lower Extremity Assessment Lower Extremity Assessment: Defer to PT evaluation   Cervical / Trunk Assessment Cervical / Trunk Assessment: Normal   Communication Communication Communication:  Expressive difficulties (difficult to understand at times, garbled speech)   Cognition Arousal/Alertness: Awake/alert Behavior During Therapy: WFL for tasks assessed/performed Overall Cognitive Status: Within Functional Limits for tasks assessed                                       General Comments       Exercises     Shoulder Instructions      Home Living Family/patient expects to be discharged to:: Private residence Living Arrangements: Other relatives (granddaughter) Available  Help at Discharge: Family;Available PRN/intermittently Type of Home: House Home Access: Stairs to enter [employer] of Steps: 3 Entrance Stairs-Rails: Right Home Layout: One level     Bathroom Shower/Tub: Teacher, early years/pre: Handicapped height     Home Equipment: Conservation officer, nature (2 wheels)          Prior Functioning/Environment Prior Level of Function : Independent/Modified Independent             Mobility Comments: does not drive, used no DME ADLs Comments: independent        OT Problem List: Pain      OT Treatment/Interventions:      OT Goals(Current goals can be found in the care plan section) Acute Rehab OT Goals OT Goal Formulation: All assessment and education complete, DC therapy  OT Frequency:      Co-evaluation              AM-PAC OT "6 Clicks" Daily Activity     Outcome Measure Help from another person eating meals?: None Help from another person taking care of personal grooming?: None Help from another person toileting, which includes using toliet, bedpan, or urinal?: None Help from another person bathing (including washing, rinsing, drying)?: None Help from another person to put on and taking off regular upper body clothing?: None Help from another person to put on and taking off regular lower body clothing?: None 6 Click Score: 24   End of Session    Activity Tolerance: Patient tolerated treatment well Patient left: in bed;with call bell/phone within reach;with family/visitor present  OT Visit Diagnosis: Pain                Time: 1157-1208 OT Time Calculation (min): 11 min Charges:  OT General Charges $OT Visit: 1 Visit OT Evaluation $OT Eval Low Complexity: 1 Low  Jill Crane, OTR/L [place]  Office [phone]   Jill Crane 04/14/2022, 12:27 PM

## 2022-04-14 NOTE — Progress Notes (Signed)
ANTICOAGULATION CONSULT NOTE   Pharmacy Consult for warfarin Indication: atrial fibrillation & hx DVT  No Known Allergies  Patient Measurements: Height: 5\' 2"  (157.5 cm) Weight: 70.1 kg (154 lb 8.7 oz) IBW/kg (Calculated) : 50.1 Heparin Dosing Weight:   Vital Signs: Temp: 98.6 F (37 C) (09/29 0506) Temp Source: Oral (09/29 0506) BP: 134/77 (09/29 1041) Pulse Rate: 72 (09/29 1041)  Labs: Recent Labs    04/12/22 0606 04/13/22 5320 04/14/22 0633  HGB 11.7* 11.3* 11.7*  HCT 37.8 36.9 37.2  PLT 312 279 280  LABPROT 18.2* 17.5* 16.8*  INR 1.5* 1.5* 1.4*  CREATININE 0.79 0.68 0.85     Estimated Creatinine Clearance: 48.4 mL/min (by C-G formula based on SCr of 0.85 mg/dL).   PTA Medication Dose:  Warfarin 2 mg daily except 4 mg on Tuesdays and Thursdays   Assessment: Pharmacy is consulted to dose warfarin in 81 yo female with PMH of DVT & atrial fibrillation. Pt admitted with IAI. LD per med rec is 04/06/22  Today, 04/14/22  INR 1.4 subtherapeutic after held x 5 days & resumed on 9/28 as expected Hgb slightly low/stable, Plt WNL  S/p lap chole 9/27 No bleeding reported  Goal of Therapy:  INR 2-3 Monitor platelets by anticoagulation protocol: Yes   Plan:  [facility] 40 qday until INR>= 2 or pt discharged Warfarin 4 mg po x 1 dose today, Could discharge on home dose of 4 mg Tu/Thursday & 2 mg all other days Continue daily INR  Monitor for signs and symptoms of bleeding   Thank you for allowing pharmacy to be a part of this patient's care.  Eudelia Bunch, Pharm.D 04/14/2022 11:12 AM

## 2022-04-14 NOTE — TOC Transition Note (Signed)
Transition of Care [facility]) - CM/SW Discharge Note   Patient Details  Name: Jill Crane MRN: [id number] Date of Birth: 04/22/1941  [facility] of [facility]) CM/SW Contact:  Leeroy Cha, RN Phone Number: 04/14/2022, 2:36 PM   Clinical Narrative:    Hhc set up for dc   Final next level of care: [place] Barriers to Discharge: Barriers Resolved   Patient Goals and CMS Choice   CMS Medicare.gov Compare Post Acute Care list provided to:: Patient Choice offered to / list presented to : Patient  Discharge Placement                       Discharge Plan and Services   Discharge Planning Services: CM Consult Post Acute Care Choice: Home Health                    HH Arranged: PT, OT, Nurse's Aide [facility] Agency: [place] ([facility]) Date [facility] Agency Contacted: 04/14/22 Time [place]: [id number] Representative spoke with at [place]: [place] ([place]) Interventions     Readmission Risk Interventions   Row Labels 04/13/2022    9:00 AM 03/28/2022    1:53 PM 02/02/2022   10:51 AM  Readmission Risk Prevention Plan   Section Header. No data exists in this row.     Post Dischage Appt    Complete Complete  Medication Screening    Complete Complete  Transportation Screening   Complete Complete Complete  PCP or Specialist Appt within 5-7 Days   Complete    Home Care Screening   Complete    Medication Review (RN CM)   Complete

## 2022-04-14 NOTE — Discharge Summary (Signed)
Physician Discharge Summary  Jill Crane WLN:[id number] DOB: Nov 04, 1940 DOA: 04/07/2022  PCP: Burman Freestone, MD  Admit date: 04/07/2022 Discharge date: 04/14/2022  Time spent: 60 minutes  Recommendations for Outpatient Follow-up:  Follow-up with Burman Freestone, MD in 2 weeks.  On follow-up patient will need a basic metabolic profile done to follow-up on electrolytes and renal function.  Patient will need a CBC done to follow-up on H&H and leukocytosis. Follow-up with [facility], general surgery on 05/04/2022. Follow-up in Coumadin clinic on 04/17/2022 for PT/INR check and further recommendations on Coumadin management. Follow-up with primary cardiologist as previously scheduled.   Discharge Diagnoses:  Principal Problem:   Acute on chronic cholecystitis Active Problems:   Vaginal candidiasis   Permanent atrial fibrillation (HCC)   Diabetes (HCC)   Essential hypertension   Chronic diastolic CHF (congestive heart failure) (HCC)   GERD (gastroesophageal reflux disease)   History of pemphigus   History of laparoscopic cholecystectomy   Leukocytosis   Discharge Condition: Stable and improved.  Diet recommendation: Carb modified diet  Filed Weights   04/07/22 1107 04/08/22 0608 04/12/22 0841  Weight: 68 kg 70.1 kg 70.1 kg    History of present illness:  HPI per Dr. Mortimer Fries is a 81 y.o. female with medical history significant of diabetes, hypertension, diastolic CHF, GERD, atrial fibrillation, GI bleed, cholecystitis presenting with abdominal pain.   She reports pain today at the site of her cholecystostomy tube.  This was originally placed in July due to recurrent cholecystitis and unfortunately had fallen out recently and was replaced a week ago.   She does report some associated pleuritic chest pain and family reports foul smell to the output of her cholecystostomy tube as well as some darker drainage this morning but has returned to green now.    Denies fevers, chills, constipation, diarrhea, nausea, vomiting.   ED Course: Vital signs in ED significant for tachycardia in the 100s to 130s, blood pressure in the 740C to 144Y systolic, respiratory rate in the 20s to 30s.  Lab work-up included CMP with glucose 147, abdomen 3.1.  CBC with leukocytosis to 15.1.  PT and INR elevated at 29.5 and 2.8 respectively.  Troponin negative x2.  Lipase normal.  Urinalysis normal.  Chest x-ray showed cardiomegaly with left lower lobe artifact versus atelectasis versus pneumonia.  CT PE study and CT of the abdomen pelvis showed percutaneous cholecystostomy tube in place at the gallbladder with gallbladder wall thickening and surrounding inflammation concerning for recurrent cholecystitis.  Small focus of intraperitoneal free air was noted indeterminant etiology as this could be related to recent catheter placement versus microperforation, mildly dilated central small bowel noted possibly representing element ileus.  No evidence of pulmonary embolus.  Cardiomegaly was noted and a trace right pleural effusion as well.  Patient received Zosyn, fentanyl, IV metoprolol, a liter of fluids in the ED.  General surgery was consulted and agree with antibiotics and admission state unclear if her inflammatory changes represent acute on chronic cholecystitis versus more chronic changes.   Hospital Course:   Assessment and Plan: * Acute on chronic cholecystitis - Patient noted with acute on chronic cholecystitis.  -Patient seen by general surgery and with no continued improvement early on in the hospitalization and subsequently patient underwent laparoscopic cholecystectomy per general surgery, Dr. Marcello Moores 04/12/2022.   -IV antibiotics of Zosyn were discontinued postoperatively.   -Patient started on clear liquid diet, diet advanced to a carb modified diet which patient tolerated.   -  Patient was followed by general surgery and will follow-up with general surgery in the outpatient  setting.   -Patient will be discharged in stable and improved condition.     Vaginal candidiasis - Patient noted with complaints of itching/vaginal candidiasis 04/12/2022. -Status post Diflucan 150 mg p.o. x1. -Patient started on clotrimazole vaginal cream nightly and will be discharged on 5 more days on days.   Leukocytosis - Patient noted with a leukocytosis felt likely reactive leukocytosis postoperatively.  -Patient noted to remain afebrile, no respiratory symptoms, no urinary symptoms.  -Patient was on IV Zosyn which was discontinued postoperatively on 04/12/2022.  -Leukocytosis trended down, patient cleared by general surgery for discharge.  -Outpatient follow-up with PCP.   History of pemphigus - Patient was maintained on home regimen prednisone 7.5 mg daily.   GERD (gastroesophageal reflux disease) - Patient maintained on PPI during the hospitalization.   Chronic diastolic CHF (congestive heart failure) (HCC) -Patient remained euvolemic during the hospitalization.   -Diuretics were held and will be resumed on discharge.   -Follow-up was continued during the hospitalization.    Essential hypertension Blood pressure stable.   -Patient maintained on home regimen metoprolol, furosemide held during the hospitalization and will be resumed on discharge.   Diabetes (HCC) - Hemoglobin A1c 6.8 (01/31/2022) -Patient's oral hypoglycemic agents were held during the hospitalization and patient maintained on sliding scale insulin.   -Oral hypoglycemic agents will be resumed on discharge.   Permanent atrial fibrillation (HCC) Stable.  Patient with history of tachybradycardia syndrome. -Patient maintained on home regimen metoprolol for rate control, Coumadin resumed and patient will need PT/INR checked on Monday, April 17, 2022.         Procedures: Laparoscopic cholecystectomy with ICG 04/12/2022 per Dr. Maisie Fus CT abdomen and pelvis 04/07/2022 CT angiogram chest 04/07/2022 Chest  x-ray 04/07/2022  Consultations: General surgery: Dr. Donell Beers 04/08/2022  Discharge Exam: Vitals:   04/14/22 1041 04/14/22 1300  BP: 134/77 137/73  Pulse: 72 84  Resp:  20  Temp:  98.7 F (37.1 C)  SpO2:  99%    General: NAD Cardiovascular: Irregularly irregular.  No JVD.  No lower extremity edema. Respiratory: Lungs clear to auscultation bilaterally.  No wheezes, no crackles, no rhonchi.  Fair air movement.  Speaking in full sentences.  Discharge Instructions   Discharge Instructions     Diet Carb Modified   Complete by: As directed    Increase activity slowly   Complete by: As directed       Allergies as of 04/14/2022   No Known Allergies      Medication List     STOP taking these medications    Normal Saline Flush 0.9 % Soln       TAKE these medications    acetaminophen 500 MG tablet Commonly known as: TYLENOL Take 500 mg by mouth every 6 (six) hours as needed for mild pain.   albuterol 108 (90 Base) MCG/ACT inhaler Commonly known as: VENTOLIN HFA Inhale 2 puffs into the lungs every 6 (six) hours as needed for wheezing or shortness of breath.   alendronate 35 MG tablet Commonly known as: FOSAMAX Take 35 mg by mouth every Monday. Take with a full glass of water on an empty stomach.   bimatoprost 0.03 % ophthalmic solution Commonly known as: LUMIGAN Place 1 drop into both eyes at bedtime.   brimonidine 0.2 % ophthalmic solution Commonly known as: ALPHAGAN Place 1 drop into the left eye in the morning and at bedtime.  busPIRone 10 MG tablet Commonly known as: BUSPAR Take 10 mg by mouth 2 (two) times daily.   calcium-vitamin D 500-200 MG-UNIT tablet Commonly known as: OSCAL WITH D Take 1 tablet by mouth.   clotrimazole 1 % vaginal cream Commonly known as: GYNE-LOTRIMIN Place 1 Applicatorful vaginally at bedtime for 5 days.   dorzolamide 2 % ophthalmic solution Commonly known as: TRUSOPT Place 1 drop into the left eye 2 (two) times  daily.   fluocinonide ointment 0.05 % Commonly known as: LIDEX Apply 1 Application topically 2 (two) times daily as needed (scalp irritation).   fluticasone 50 MCG/ACT nasal spray Commonly known as: FLONASE Place 2 sprays into both nostrils daily as needed for allergies.   furosemide 20 MG tablet Commonly known as: LASIX Take 1 tablet (20 mg total) by mouth daily. What changed:  when to take this reasons to take this   gabapentin 300 MG capsule Commonly known as: NEURONTIN Take 300 mg by mouth at bedtime.   HYDROcodone-acetaminophen 5-325 MG tablet Commonly known as: NORCO/VICODIN Take 1 tablet by mouth every 6 (six) hours as needed for severe pain.   magnesium oxide 400 (241.3 Mg) MG tablet Commonly known as: MAG-OX Take 400 mg by mouth in the morning and at bedtime.   metFORMIN 500 MG tablet Commonly known as: GLUCOPHAGE Take 500 mg by mouth 2 (two) times daily.   metoprolol succinate 25 MG 24 hr tablet Commonly known as: TOPROL-XL Take 12.5 mg by mouth daily.   omeprazole 20 MG capsule Commonly known as: PRILOSEC Take 20 mg by mouth daily.   predniSONE 2.5 MG tablet Commonly known as: DELTASONE Take 7.5 mg by mouth daily.   silver sulfADIAZINE 1 % cream Commonly known as: SILVADENE Apply 1 Application topically daily as needed (scalp irritation).   warfarin 2 MG tablet Commonly known as: COUMADIN On Saturday and Sunday, take 2 tabs (4mg ), then resume normal regimen Take 1 tablet (2 mg) daily except Take 2 tablets (4 mg) on (Tues & Fri) What changed:  how much to take how to take this when to take this additional instructions       No Known Allergies  Follow-up Information     Maczis, Hedda Sladeuja Gosai, PA-C. Go on 05/04/2022.   Specialty: General Surgery Why: Your appointment is 10/19 at 2pm Arrive 15 minutes early to check in. Bring photo ID and Insurance account managerinsurance information Contact information: [address] [place]  [id number] [phone]         Zoila ShutterWoodyear, Wynne E, MD. Schedule an appointment as soon as possible for a visit in 2 week(s).   Specialty: Internal Medicine Contact information: [address] [place] [id number] [phone]         Cardiologist Follow up.   Why: Follow-up as scheduled.        Coumadin clinic Follow up on 04/17/2022.                   The results of significant diagnostics from this hospitalization (including imaging, microbiology, ancillary and laboratory) are listed below for reference.    Significant Diagnostic Studies: CT Angio Chest PE W and/or Wo Contrast  Result Date: 04/07/2022 CLINICAL DATA:  High probability for PE. Infection in the right upper quadrant. Drain placed in July. EXAM: CT ANGIOGRAPHY CHEST CT ABDOMEN AND PELVIS WITH CONTRAST TECHNIQUE: Multidetector CT imaging of the chest was performed using the standard protocol during bolus administration of intravenous contrast. Multiplanar CT image reconstructions  and MIPs were obtained to evaluate the vascular anatomy. Multidetector CT imaging of the abdomen and pelvis was performed using the standard protocol during bolus administration of intravenous contrast. RADIATION DOSE REDUCTION: This exam was performed according to the departmental dose-optimization program which includes automated exposure control, adjustment of the mA and/or kV according to patient size and/or use of iterative reconstruction technique. CONTRAST:  OMNIPAQUE IOHEXOL 350 MG/ML SOLN COMPARISON:  CT abdomen and pelvis 03/23/2022. CT angiogram chest 01/20/2021. FINDINGS: CTA CHEST FINDINGS Cardiovascular: Heart is mildly enlarged. Aorta is normal in size. There are atherosclerotic calcifications of the aorta and coronary arteries. There is no pericardial effusion. There is adequate opacification of the pulmonary arteries to the segmental level. There is no evidence for pulmonary embolism. Mediastinum/Nodes:  No enlarged mediastinal, hilar, or axillary lymph nodes. Thyroid gland, trachea, and esophagus demonstrate no significant findings. Lungs/Pleura: There is minimal atelectasis in the lung bases. Lungs are otherwise clear. There is a trace right pleural effusion. The lungs are otherwise clear. No pneumothorax. Musculoskeletal: There is scoliosis of the thoracic spine with multilevel degenerative change similar to prior. Review of the MIP images confirms the above findings. CT ABDOMEN and PELVIS FINDINGS Hepatobiliary: Percutaneous cholecystostomy tube is present with distal tip coiled within the gallbladder. There is gallbladder wall thickening and surrounding inflammatory stranding. There is no evidence for adjacent focal fluid collection or adjacent air. Pneumobilia in the liver and common bile duct are present compatible with cholecystostomy tube. The liver otherwise appears within normal limits. Pancreas: Unremarkable. No pancreatic ductal dilatation or surrounding inflammatory changes. Spleen: Normal in size without focal abnormality. Adrenals/Urinary Tract: There is a 2 cm cyst in the left kidney. There is no hydronephrosis or perinephric fat stranding. There is some scarring in the superior pole the right kidney, unchanged. The right kidney is slightly malrotated, also unchanged. The adrenal glands and bladder are within normal limits. Stomach/Bowel: Stomach is within normal limits. Appendix appears normal. There is no focal bowel wall thickening or inflammation. Colonic diverticula are present. There are some mildly dilated central small bowel loops with air-fluid levels measuring up to 3.5 cm in the central abdomen. No significant mesenteric edema present. Vascular/Lymphatic: There is severe atherosclerotic calcifications throughout the abdomen and pelvis. Aorta is normal in size. Infrarenal IVC filter is present. Reproductive: Uterus and bilateral adnexa are unremarkable. Other: Single small focus of free  air with mild surrounding inflammation seen in the anterior upper abdomen image 6/29. This is anterior to the transverse colon. Focal abdominal wall hernia. There is a small amount of intramuscular abdominal wall edema where the catheter is placed. Small amount of subcutaneous air seen in the anterior left abdomen which may represent medication injection site. No ascites. Musculoskeletal: No acute fractures. Review of the MIP images confirms the above findings. IMPRESSION: 1. Percutaneous cholecystostomy tube in place in the gallbladder. There is gallbladder wall thickening with surrounding inflammation worrisome for cholecystitis. No adjacent fluid collection. 2. Single small focus of free intraperitoneal air anterior to the transverse colon. This is indeterminate and may be related to recent catheter placement. Bowel micro perforation is not excluded in the appropriate clinical setting, although there is no focal bowel wall thickening or inflammation. 3. Mildly dilated central small bowel loops may represent ileus or partial small bowel obstruction. Continued follow-up recommended. 4. No evidence for pulmonary embolism. 5. Trace right pleural effusion. 6. Mild cardiomegaly. 7. Aortic Atherosclerosis (ICD10-I70.0). 8. Left renal cysts.  No follow-up imaging necessary. Electronically Signed   By:  Darliss Cheney M.D.   On: 04/07/2022 16:46   CT ABDOMEN PELVIS W CONTRAST  Result Date: 04/07/2022 CLINICAL DATA:  High probability for PE. Infection in the right upper quadrant. Drain placed in July. EXAM: CT ANGIOGRAPHY CHEST CT ABDOMEN AND PELVIS WITH CONTRAST TECHNIQUE: Multidetector CT imaging of the chest was performed using the standard protocol during bolus administration of intravenous contrast. Multiplanar CT image reconstructions and MIPs were obtained to evaluate the vascular anatomy. Multidetector CT imaging of the abdomen and pelvis was performed using the standard protocol during bolus administration of  intravenous contrast. RADIATION DOSE REDUCTION: This exam was performed according to the departmental dose-optimization program which includes automated exposure control, adjustment of the mA and/or kV according to patient size and/or use of iterative reconstruction technique. CONTRAST:  OMNIPAQUE IOHEXOL 350 MG/ML SOLN COMPARISON:  CT abdomen and pelvis 03/23/2022. CT angiogram chest 01/20/2021. FINDINGS: CTA CHEST FINDINGS Cardiovascular: Heart is mildly enlarged. Aorta is normal in size. There are atherosclerotic calcifications of the aorta and coronary arteries. There is no pericardial effusion. There is adequate opacification of the pulmonary arteries to the segmental level. There is no evidence for pulmonary embolism. Mediastinum/Nodes: No enlarged mediastinal, hilar, or axillary lymph nodes. Thyroid gland, trachea, and esophagus demonstrate no significant findings. Lungs/Pleura: There is minimal atelectasis in the lung bases. Lungs are otherwise clear. There is a trace right pleural effusion. The lungs are otherwise clear. No pneumothorax. Musculoskeletal: There is scoliosis of the thoracic spine with multilevel degenerative change similar to prior. Review of the MIP images confirms the above findings. CT ABDOMEN and PELVIS FINDINGS Hepatobiliary: Percutaneous cholecystostomy tube is present with distal tip coiled within the gallbladder. There is gallbladder wall thickening and surrounding inflammatory stranding. There is no evidence for adjacent focal fluid collection or adjacent air. Pneumobilia in the liver and common bile duct are present compatible with cholecystostomy tube. The liver otherwise appears within normal limits. Pancreas: Unremarkable. No pancreatic ductal dilatation or surrounding inflammatory changes. Spleen: Normal in size without focal abnormality. Adrenals/Urinary Tract: There is a 2 cm cyst in the left kidney. There is no hydronephrosis or perinephric fat stranding. There is some  scarring in the superior pole the right kidney, unchanged. The right kidney is slightly malrotated, also unchanged. The adrenal glands and bladder are within normal limits. Stomach/Bowel: Stomach is within normal limits. Appendix appears normal. There is no focal bowel wall thickening or inflammation. Colonic diverticula are present. There are some mildly dilated central small bowel loops with air-fluid levels measuring up to 3.5 cm in the central abdomen. No significant mesenteric edema present. Vascular/Lymphatic: There is severe atherosclerotic calcifications throughout the abdomen and pelvis. Aorta is normal in size. Infrarenal IVC filter is present. Reproductive: Uterus and bilateral adnexa are unremarkable. Other: Single small focus of free air with mild surrounding inflammation seen in the anterior upper abdomen image 6/29. This is anterior to the transverse colon. Focal abdominal wall hernia. There is a small amount of intramuscular abdominal wall edema where the catheter is placed. Small amount of subcutaneous air seen in the anterior left abdomen which may represent medication injection site. No ascites. Musculoskeletal: No acute fractures. Review of the MIP images confirms the above findings. IMPRESSION: 1. Percutaneous cholecystostomy tube in place in the gallbladder. There is gallbladder wall thickening with surrounding inflammation worrisome for cholecystitis. No adjacent fluid collection. 2. Single small focus of free intraperitoneal air anterior to the transverse colon. This is indeterminate and may be related to recent catheter placement. Bowel  micro perforation is not excluded in the appropriate clinical setting, although there is no focal bowel wall thickening or inflammation. 3. Mildly dilated central small bowel loops may represent ileus or partial small bowel obstruction. Continued follow-up recommended. 4. No evidence for pulmonary embolism. 5. Trace right pleural effusion. 6. Mild  cardiomegaly. 7. Aortic Atherosclerosis (ICD10-I70.0). 8. Left renal cysts.  No follow-up imaging necessary. Electronically Signed   By: Darliss Cheney M.D.   On: 04/07/2022 16:46   DG Chest Portable 1 View  Result Date: 04/07/2022 CLINICAL DATA:  Chest pain EXAM: PORTABLE CHEST 1 VIEW COMPARISON:  Previous studies including the examination of 02/10/2022 FINDINGS: Transverse diameter of heart is increased. There are no signs of pulmonary edema. There is increased density in the lateral aspect of left lower lung field. Left lateral CP angle is indistinct. Right lateral CP angle is clear. There is no pneumothorax. IMPRESSION: Cardiomegaly. Increased density in the lateral aspect of left lower lung field may be an artifact due to chest wall attenuation or suggest atelectasis/pneumonia and pleural effusion. Follow-up PA and lateral views of chest along with CT if warranted may be considered. Electronically Signed   By: Ernie Avena M.D.   On: 04/07/2022 13:02   VAS [place] ABI WITH/WO TBI  Result Date: 03/30/2022  LOWER EXTREMITY DOPPLER STUDY Patient Name:  Jill Crane  Date of Exam:   03/29/2022 Medical Rec #: [id number]       Accession #:    [id number] Date of Birth: 1940/12/08       Patient Gender: F Patient Age:   28 years Exam Location:  [facility] Procedure:      VAS [place] ABI WITH/WO TBI Referring Phys: Kathlen Mody --------------------------------------------------------------------------------  Indications: Rest pain. High Risk Factors: Hypertension, past history of smoking. Other Factors: AFIB, CHF.  Comparison Study: No previous exams Performing Technologist: Hill, Jody RVT, RDMS  Examination Guidelines: A complete evaluation includes at minimum, Doppler waveform signals and systolic blood pressure reading at the level of bilateral brachial, anterior tibial, and posterior tibial arteries, when vessel segments are accessible. Bilateral testing is considered an integral part of a complete  examination. Photoelectric Plethysmograph (PPG) waveforms and toe systolic pressure readings are included as required and additional duplex testing as needed. Limited examinations for reoccurring indications may be performed as noted.  ABI Findings: +---------+------------------+-----+----------+--------+ Right    Rt Pressure (mmHg)IndexWaveform  Comment  +---------+------------------+-----+----------+--------+ Brachial 124                    triphasic          +---------+------------------+-----+----------+--------+ PTA      76                0.61 monophasic         +---------+------------------+-----+----------+--------+ DP       81                0.65 monophasic         +---------+------------------+-----+----------+--------+ Great Toe38                0.31 Abnormal           +---------+------------------+-----+----------+--------+ +---------+------------------+-----+----------+-------+ Left     Lt Pressure (mmHg)IndexWaveform  Comment +---------+------------------+-----+----------+-------+ Brachial 101                    monophasic        +---------+------------------+-----+----------+-------+ PTA  absent            +---------+------------------+-----+----------+-------+ DP       42                0.34                   +---------+------------------+-----+----------+-------+ Great Toe                       Absent            +---------+------------------+-----+----------+-------+ +-------+-----------+-----------+------------+------------+ ABI/TBIToday's ABIToday's TBIPrevious ABIPrevious TBI +-------+-----------+-----------+------------+------------+ Right  0.66                                           +-------+-----------+-----------+------------+------------+ Left   0.34                                           +-------+-----------+-----------+------------+------------+  A >20 mmHg difference on UE  pressures and monophasic doppler waveform suggests 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 Patient also complains of occasional pain / discomfort to the LUE.  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 The right toe-brachial index is abnormal. 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 Unable to obtain doppler waveform or pressure for posterior tibial artery. The left TBI is absent. *See table(s) above for measurements and observations.  Vascular consult recommended. Electronically signed by Heath Lark on 03/30/2022 at 4:24:02 PM.    Final    IR Perc Cholecystostomy  Result Date: 03/28/2022 INDICATION: Acute cholecystitis. Interval removal of prior cholecystostomy tube placed 01/30/2022. EXAM: ULTRASOUND AND FLUOROSCOPIC-GUIDED CHOLECYSTOSTOMY TUBE PLACEMENT COMPARISON:  CT AP, 03/23/2022. MEDICATIONS: The patient is currently admitted to the hospital and on intravenous antibiotics. Antibiotics were administered within an appropriate time frame prior to skin puncture. ANESTHESIA/SEDATION: Moderate (conscious) sedation was employed during this procedure. A total of Versed 0.5 mg and Fentanyl 50 mcg was administered intravenously. Moderate Sedation Time: 20 minutes. The patient's level of consciousness and vital signs were monitored continuously by radiology nursing throughout the procedure under my direct supervision. CONTRAST:  15mL OMNIPAQUE IOHEXOL 300 MG/ML SOLN - administered into the gallbladder fossa. FLUOROSCOPY TIME:  Fluoroscopic dose; 8 mGy COMPLICATIONS: None immediate. PROCEDURE: Informed written consent was obtained from the the patient and/or patient's representative after a discussion of the risks, benefits and alternatives to treatment. Questions regarding the procedure were encouraged and answered. A timeout was performed prior to the initiation of the  procedure. The RIGHT upper abdominal quadrant was prepped and draped in the usual sterile fashion, and a sterile drape was applied covering the operative field. Maximum barrier sterile technique with sterile gowns and gloves were used for the procedure. A timeout was performed prior to the initiation of the procedure. Local anesthesia was provided with 1% lidocaine with epinephrine. Ultrasound scanning of the right upper quadrant demonstrates a markedly dilated gallbladder. Utilizing a transhepatic approach, a 22 gauge needle was advanced into the gallbladder under direct ultrasound guidance. An ultrasound image was saved for documentation purposes. Appropriate intraluminal puncture was confirmed with the efflux of bile and advancement of an 0.018 wire into the gallbladder lumen. The needle was exchanged for an Accustick set. A small amount  of contrast was injected to confirm appropriate intraluminal positioning. Over a short Amplatz wire, a 10 Fr cholecystomy tube was advanced into the gallbladder fossa, coiled and locked. Bile was aspirated and a small amount of contrast was injected as several post procedural spot radiographic images were obtained in various obliquities. The catheter was secured to the skin with suture, connected to a drainage bag and a dressing was placed. The patient tolerated the procedure well without immediate post procedural complication. IMPRESSION: Successful placement of a 10 Fr cholecystostomy drainage tube via transhepatic approach, as above. PLAN: * The patient will return to Vascular Interventional Radiology (VIR) for routine cholecystostomy drainage catheter evaluation and exchange in 8 weeks. 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 IVC filters can cause complications when left in place for extended periods of time. Patient will be referred to vascular interventional radiology clinic for evaluation for removal. Roanna Banning, MD [facility] Radiology Electronically Signed   By: Roanna Banning M.D.   On: 03/28/2022 20:38   CT ABDOMEN PELVIS W CONTRAST  Result Date: 03/23/2022 CLINICAL DATA:  Abdominal pain x2 days EXAM: CT ABDOMEN AND PELVIS WITH CONTRAST TECHNIQUE: Multidetector CT imaging of the abdomen and pelvis was performed using the standard protocol following bolus administration of intravenous contrast. RADIATION DOSE REDUCTION: This exam was performed according to the departmental dose-optimization program which includes automated exposure control, adjustment of the mA and/or kV according to patient size and/or use of iterative reconstruction technique. CONTRAST:  OMNIPAQUE IOHEXOL 300 MG/ML  SOLN COMPARISON:  01/20/2021 FINDINGS: Lower chest: Heart is enlarged in size. Visualized lower lung fields are clear. Hepatobiliary: Air is seen in the intrahepatic and extrahepatic bile ducts. This may suggest a previous sphincterotomy. There is fluid around the gallbladder. There is inflammatory stranding in the fat planes immediately behind the right rectus muscle close to the fundus of the gallbladder. There are pockets of air in the gallbladder and pericholecystic region. Clinical history suggests that the patient had placement of cholecystostomy tube which has fallen out. Air in and adjacent to the gallbladder may be related to recently removed cholecystostomy tube all suggest emphysematous cholecystitis. There is no loculated thick-walled fluid collection adjacent to the gallbladder. Pancreas: No focal abnormalities are seen. Spleen: Unremarkable. Adrenals/Urinary Tract: Adrenals are unremarkable. 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 There is no hydronephrosis. There are few low-density lesions in kidneys, possibly cysts. There are no renal or ureteral stones. Urinary bladder is unremarkable. Stomach/Bowel: Small hiatal hernia is seen. There is no significant dilation of small bowel  loops. The appendix is unremarkable. Scattered diverticula are seen in colon without signs of focal diverticulitis. Vascular/Lymphatic: Atherosclerotic plaques and calcifications are seen in aorta and its major branches. 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 Reproductive: Unremarkable. Other: There is no ascites or significant pneumoperitoneum. Umbilical hernia containing fat is seen. Musculoskeletal: There is minimal anterolisthesis at the L4-L5 level. There is mild to moderate spinal stenosis at the L4-L5 level. There is encroachment of neural foramina at multiple levels. IMPRESSION: There is fluid around the gallbladder. There are pockets of air in and adjacent to the gallbladder. Clinical history suggests recent placement of cholecystostomy catheter which may have fallen out. These changes could be related to recent placement of cholecystostomy catheter. Another diagnostic possibility would be emphysematous cholecystitis. There is stranding in the fat planes adjacent to the fundus  of the gallbladder without any thick-walled loculated fluid collections. Air in the lumen of bile ducts may suggest previous sphincterotomy. There is no evidence of intestinal obstruction or pneumoperitoneum. There is no hydronephrosis. Diverticulosis of colon without focal diverticulitis. Other findings as described in the body of the report. Imaging finding of air in and around the gallbladder was discussed with Dr. Jacqulyn Bath by telephone call. Electronically Signed   By: Ernie Avena M.D.   On: 03/23/2022 15:29    Microbiology: No results found for this or any previous visit (from the past 240 hour(s)).   Labs: Basic Metabolic Panel: Recent Labs  Lab 04/09/22 0608 04/10/22 0540 04/12/22 0606 04/13/22 0613 04/14/22 0633  NA 138 139 139 139 139  K 4.1 3.7 4.1 4.7 4.5  CL 108 109 109 108 106  CO2 23 22 23 23 23   GLUCOSE 85 78 81 104* 85  BUN 12 15 17 13 18    CREATININE 0.94 0.90 0.79 0.68 0.85  CALCIUM 8.9 9.0 9.0 9.0 9.4  MG  --   --   --   --  2.0   Liver Function Tests: Recent Labs  Lab 04/09/22 0608 04/10/22 0540 04/12/22 0606 04/13/22 0613 04/14/22 0633  AST 13* 12* 15 36 38  ALT 10 11 14 25 26   ALKPHOS 53 46 47 49 57  BILITOT 0.7 0.6 0.5 0.6 0.8  PROT 7.0 6.8 6.6 6.7 7.2  ALBUMIN 2.8* 2.6* 2.7* 2.7* 2.8*   No results for input(s): "LIPASE", "AMYLASE" in the last 168 hours. No results for input(s): "AMMONIA" in the last 168 hours. CBC: Recent Labs  Lab 04/09/22 0608 04/10/22 0540 04/12/22 0606 04/13/22 0613 04/14/22 0633  WBC 11.4* 9.4 7.9 18.6* 16.8*  NEUTROABS  --   --   --   --  12.5*  HGB 11.8* 11.3* 11.7* 11.3* 11.7*  HCT 38.1 35.7* 37.8 36.9 37.2  MCV 87.2 86.2 86.3 86.6 86.1  PLT 302 294 312 279 280   Cardiac Enzymes: No results for input(s): "CKTOTAL", "CKMB", "CKMBINDEX", "TROPONINI" in the last 168 hours. BNP: BNP (last 3 results) Recent Labs    01/29/22 0500  BNP 733.4*    ProBNP (last 3 results) No results for input(s): "PROBNP" in the last 8760 hours.  CBG: Recent Labs  Lab 04/13/22 1145 04/13/22 1642 04/13/22 2037 04/14/22 0734 04/14/22 1121  GLUCAP 131* 140* 150* 87 126*       Signed:  2038 MD.  [facility] 04/14/2022, 3:53 PM

## 2022-04-17 ENCOUNTER — Inpatient Hospital Stay: Admission: RE | Admit: 2022-04-17 | Payer: Medicare Other | Source: Ambulatory Visit

## 2022-04-17 LAB — SURGICAL PATHOLOGY

## 2022-05-23 ENCOUNTER — Inpatient Hospital Stay (HOSPITAL_COMMUNITY): Admit: 2022-05-23 | Payer: Medicare Other

## 2022-05-29 NOTE — Progress Notes (Unsigned)
[facility] [place]  ASSESSMENT / PLAN: Jill Crane is a 81 y.o. 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  Patient counseled {pad risk2:26283}  WIfI score calculated based on clinical exam and non-invasive measurements. {WIFIvascular:26096}  Recommend the following which can slow the progression of atherosclerosis and reduce the risk of major adverse cardiac / limb events:  Complete cessation from all tobacco products. Blood glucose control with goal A1c < 7%. Blood pressure control with goal blood pressure < 140/90 mmHg. Lipid reduction therapy with goal LDL-C <100 mg/dL (<16<70 if symptomatic from PAD).  Aspirin 81mg  PO QD.  *** Clopidogrel 75mg  PO QD. *** Rivaroxaban 2.5mg  PO BID. *** Cilostozal 100mg  PO BID for intermittent claudication without evidence of heart failure. Atorvastatin 40-80mg  PO QD (or other "high intensity" statin therapy). *** Daily walking to and past the point of discomfort. Patient counseled to keep a log of exercise distance. *** Adequate hydration (at least 2 liters / day) if patient's heart and kidney function is adequate.  Plan *** lower extremity angiogram with possible intervention via *** approach in cath lab ***.    CHIEF COMPLAINT: ***  HISTORY OF PRESENT ILLNESS: Jill AusSarah J Crane is a 81 y.o. female ***  VASCULAR SURGICAL HISTORY: ***  VASCULAR RISK FACTORS: {FINDINGS; POSITIVE [phone]} history of stroke / transient ischemic attack. {FINDINGS; POSITIVE [phone]} history of coronary artery disease. *** history of PCI. *** history of CABG.  {FINDINGS; POSITIVE [phone]} history of diabetes mellitus. Last A1c ***. {FINDINGS; POSITIVE [phone]} history of smoking. *** actively smoking. {FINDINGS; POSITIVE [phone]} history of hypertension. *** drug regimen with *** control. {FINDINGS; POSITIVE [phone]}  history of chronic kidney disease.  Last GFR ***. CKD {stage:30421363}. {FINDINGS; POSITIVE [phone]} history of chronic obstructive pulmonary disease, treated with ***.  FUNCTIONAL STATUS: ECOG performance status: {findings; ecog performance status:31780} Ambulatory status: {TNHAmbulation:25868}  CAREY 1 AND 3 YEAR INDEX Female (2pts) 75-79 or 80-84 (2pts) >84 (3pts) Dependence in toileting (1pt) Partial or full dependence in dressing (1pt) History of malignant neoplasm (2pts) CHF (3pts) COPD (1pts) CKD (3pts)  0-3 pts 6% 1 year mortality ; 21% 3 year mortality 4-5 pts 12% 1 year mortality ; 36% 3 year mortality >5 pts 21% 1 year mortality; 54% 3 year mortality   Past Medical History:  Diagnosis Date   Atrial fibrillation with normal ventricular rate (HCC)    CHF (congestive heart failure) (HCC)    COPD (chronic obstructive pulmonary disease) (HCC)    Diabetes mellitus without complication (HCC)    Hx of blood clots    Hypertension    Sepsis (HCC) 11/10/2019   SIRS (systemic inflammatory response syndrome) (HCC) 08/15/2020    Past Surgical History:  Procedure Laterality Date   CHOLECYSTECTOMY N/A 04/12/2022   Procedure: LAPAROSCOPIC CHOLECYSTECTOMY WITH ICG;  Surgeon: Romie Crane, Alicia, MD;  Location: [facility] ORS;  Service: General;  Laterality: N/A;   ENDOSCOPIC RETROGRADE CHOLANGIOPANCREATOGRAPHY (ERCP) WITH PROPOFOL N/A 01/22/2021   Procedure: ENDOSCOPIC RETROGRADE CHOLANGIOPANCREATOGRAPHY (ERCP) WITH PROPOFOL;  Surgeon: Jill Crane, John N, MD;  [facility];  Service: Endoscopy;  Laterality: N/A;   ESOPHAGOGASTRODUODENOSCOPY (EGD) WITH PROPOFOL N/A 01/28/2021   Procedure: ESOPHAGOGASTRODUODENOSCOPY (EGD) WITH PROPOFOL;  Surgeon: Jill Crane, Carl E, MD;  [facility];  Service: Endoscopy;  Laterality: N/A;   IR PERC CHOLECYSTOSTOMY  01/30/2022   IR PERC CHOLECYSTOSTOMY  03/28/2022   REMOVAL OF STONES  01/22/2021   Procedure: REMOVAL OF STONES;  Surgeon: Jill Crane,  John N, MD;  Location: [facility] ENDOSCOPY;  Service: Endoscopy;;   SPHINCTEROTOMY  01/22/2021   Procedure: SPHINCTEROTOMY;  Surgeon: Jill Fredrickson, MD;  Location: [facility] ENDOSCOPY;  Service: Endoscopy;;    Family History  Problem Relation Age of Onset   Stroke Mother    Diabetes Mother    Stroke Father     Social History   Socioeconomic History   Marital status: Widowed    Spouse name: Not on file   Number of children: Not on file   Years of education: Not on file   Highest education level: Not on file  Occupational History   Not on file  Tobacco Use   Smoking status: Former    Types: Cigarettes   Smokeless tobacco: Never  Vaping Use   Vaping Use: Never used  Substance and Sexual Activity   Alcohol use: No   Drug use: Never   Sexual activity: Not on file  Other Topics Concern   Not on file  Social History Narrative   Not on file   Social Determinants of Health   Financial Resource Strain: Not on file  Food Insecurity: No Food Insecurity (04/08/2022)   Hunger Vital Sign    Worried About Running Out of Food in the Last Year: Never true    Ran Out of Food in the Last Year: Never true  Transportation Needs: No Transportation Needs (04/08/2022)   PRAPARE - Administrator, Civil Service (Medical): No    Lack of Transportation (Non-Medical): No  Physical Activity: Not on file  Stress: Not on file  Social Connections: Not on file  Intimate Partner Violence: Not At Risk (04/08/2022)   Humiliation, Afraid, Rape, and Kick questionnaire    Fear of Current or Ex-Partner: No    Emotionally Abused: No    Physically Abused: No    Sexually Abused: No    No Known Allergies  Current Outpatient Medications  Medication Sig Dispense Refill   acetaminophen (TYLENOL) 500 MG tablet Take 500 mg by mouth every 6 (six) hours as needed for mild pain.     albuterol (VENTOLIN HFA) 108 (90 Base) MCG/ACT inhaler Inhale 2 puffs into the lungs every 6 (six) hours as needed for wheezing or  shortness of breath. (Patient not taking: Reported on 04/07/2022)     alendronate (FOSAMAX) 35 MG tablet Take 35 mg by mouth every Monday. Take with a full glass of water on an empty stomach.     bimatoprost (LUMIGAN) 0.03 % ophthalmic solution Place 1 drop into both eyes at bedtime.     brimonidine (ALPHAGAN) 0.2 % ophthalmic solution Place 1 drop into the left eye in the morning and at bedtime.     busPIRone (BUSPAR) 10 MG tablet Take 10 mg by mouth 2 (two) times daily.     calcium-vitamin D (OSCAL WITH D) 500-200 MG-UNIT per tablet Take 1 tablet by mouth.     dorzolamide (TRUSOPT) 2 % ophthalmic solution Place 1 drop into the left eye 2 (two) times daily.     fluocinonide ointment (LIDEX) 0.05 % Apply 1 Application topically 2 (two) times daily as needed (scalp irritation).     fluticasone (FLONASE) 50 MCG/ACT nasal spray Place 2 sprays into both nostrils daily as needed for allergies.     furosemide (LASIX) 20 MG tablet Take 1 tablet (20 mg total) by mouth daily. (Patient taking differently: Take 20 mg by mouth daily as needed (swelling).)     gabapentin (NEURONTIN) 300 MG capsule Take 300 mg by  mouth at bedtime.     HYDROcodone-acetaminophen (NORCO/VICODIN) 5-325 MG tablet Take 1 tablet by mouth every 6 (six) hours as needed for severe pain. 15 tablet 0   magnesium oxide (MAG-OX) 400 (241.3 Mg) MG tablet Take 400 mg by mouth in the morning and at bedtime.     metFORMIN (GLUCOPHAGE) 500 MG tablet Take 500 mg by mouth 2 (two) times daily.     metoprolol succinate (TOPROL-XL) 25 MG 24 hr tablet Take 12.5 mg by mouth daily.     omeprazole (PRILOSEC) 20 MG capsule Take 20 mg by mouth daily.     predniSONE (DELTASONE) 2.5 MG tablet Take 7.5 mg by mouth daily.     silver sulfADIAZINE (SILVADENE) 1 % cream Apply 1 Application topically daily as needed (scalp irritation).     warfarin (COUMADIN) 2 MG tablet On Saturday and Sunday, take 2 tabs (4mg ), then resume normal regimen Take 1 tablet (2 mg) daily  except Take 2 tablets (4 mg) on (Tues & Fri) (Patient taking differently: Take 2-4 mg by mouth daily. Takes 2 mg (1 tab) daily except 4 mg (2 tab) on Tues and Thurs) 30 tablet 1   No current facility-administered medications for this visit.    PHYSICAL EXAM There were no vitals filed for this visit.  Constitutional: *** appearing. *** distress. Appears *** nourished.  Neurologic: CN ***. *** focal findings. *** sensory loss. Psychiatric: *** Mood and affect symmetric and appropriate. Eyes: *** No icterus. No conjunctival pallor. Ears, nose, throat: *** mucous membranes moist. Midline trachea.  Cardiac: *** rate and rhythm.  Respiratory: *** unlabored. Abdominal: *** soft, non-tender, non-distended.  Peripheral vascular: *** Extremity: *** edema. *** cyanosis. *** pallor.  Skin: *** gangrene. *** ulceration.  Lymphatic: *** Stemmer's sign. *** palpable lymphadenopathy.    PERTINENT LABORATORY AND RADIOLOGIC DATA  Most recent CBC    Latest Ref Rng & Units 04/14/2022    6:33 AM 04/13/2022    6:13 AM 04/12/2022    6:06 AM  CBC  WBC 4.0 - 10.5 K/uL 16.8  18.6  7.9   Hemoglobin 12.0 - 15.0 g/dL 04/14/2022  19.1  47.8   Hematocrit 36.0 - 46.0 % 37.2  36.9  37.8   Platelets 150 - 400 K/uL 280  279  312      Most recent CMP    Latest Ref Rng & Units 04/14/2022    6:33 AM 04/13/2022    6:13 AM 04/12/2022    6:06 AM  CMP  Glucose 70 - 99 mg/dL 85  04/14/2022  81   BUN 8 - 23 mg/dL 18  13  17    Creatinine 0.44 - 1.00 mg/dL 621   3.08   Sodium 135 - 145 mmol/L 139  139  139   Potassium 3.5 - 5.1 mmol/L 4.5  4.7  4.1   Chloride 98 - 111 mmol/L 106  108  109   CO2 22 - 32 mmol/L 23  23  23    Calcium 8.9 - 10.3 mg/dL 9.4  9.0  9.0   Total Protein 6.5 - 8.1 g/dL 7.2  6.7  6.6   Total Bilirubin 0.3 - 1.2 mg/dL 0.8  0.6  0.5   Alkaline Phos 38 - 126 U/L 57  49  47   AST 15 - 41 U/L 38  36  15   ALT 0 - 44 U/L 26  25  14      Renal function CrCl cannot be calculated (Patient's most recent  lab result is older than the maximum 21 days allowed.).  Hgb A1c MFr Bld (%)  Date Value  01/31/2022 6.8 (H)    +-------+-----------+-----------+------------+------------+  ABI/TBIToday's ABIToday's TBIPrevious ABIPrevious TBI  +-------+-----------+-----------+------------+------------+  Right 0.66                                            +-------+-----------+-----------+------------+------------+  Left  0.34                                            +-------+-----------+-----------+------------+------------+       Rande Brunt. Lenell Antu, MD [facility] Phone Number: [phone] 05/29/2022 8:56 PM   Total time spent on preparing this encounter including chart review, data review, collecting history, examining the patient, coordinating care for this {tnhtimebilling:26202}  Portions of this report may have been transcribed using voice recognition software.  Every effort has been made to ensure accuracy; however, inadvertent computerized transcription errors may still be present.

## 2022-05-30 ENCOUNTER — Encounter: Payer: Self-pay | Admitting: Vascular Surgery

## 2022-05-30 ENCOUNTER — Ambulatory Visit (INDEPENDENT_AMBULATORY_CARE_PROVIDER_SITE_OTHER): Payer: Medicare Other | Admitting: Vascular Surgery

## 2022-05-30 VITALS — BP 105/70 | HR 68 | Temp 97.9°F | Resp 20 | Ht 62.0 in | Wt 153.0 lb

## 2022-05-30 DIAGNOSIS — I7409 Other arterial embolism and thrombosis of abdominal aorta: Secondary | ICD-10-CM | POA: Diagnosis not present

## 2022-05-31 ENCOUNTER — Other Ambulatory Visit: Payer: Self-pay | Admitting: Interventional Radiology

## 2022-05-31 DIAGNOSIS — K812 Acute cholecystitis with chronic cholecystitis: Secondary | ICD-10-CM

## 2022-06-06 ENCOUNTER — Other Ambulatory Visit: Payer: Self-pay

## 2022-06-06 DIAGNOSIS — I7409 Other arterial embolism and thrombosis of abdominal aorta: Secondary | ICD-10-CM

## 2022-06-21 ENCOUNTER — Inpatient Hospital Stay: Admission: RE | Admit: 2022-06-21 | Payer: Medicare Other | Source: Ambulatory Visit

## 2022-06-21 ENCOUNTER — Other Ambulatory Visit: Payer: Medicare Other

## 2022-06-27 ENCOUNTER — Ambulatory Visit (HOSPITAL_COMMUNITY)
Admission: RE | Admit: 2022-06-27 | Discharge: 2022-06-27 | Disposition: A | Discharge status: Home / Self Care | Payer: Medicare Other | Source: Ambulatory Visit | Attending: Vascular Surgery | Admitting: Vascular Surgery

## 2022-06-27 DIAGNOSIS — E1165 Type 2 diabetes mellitus with hyperglycemia: Secondary | ICD-10-CM | POA: Insufficient documentation

## 2022-06-27 DIAGNOSIS — I7409 Other arterial embolism and thrombosis of abdominal aorta: Secondary | ICD-10-CM | POA: Insufficient documentation

## 2022-06-27 LAB — POCT I-STAT CREATININE: Creatinine, Ser: 1.1 mg/dL — ABNORMAL HIGH (ref 0.44–1.00)

## 2022-06-27 MED ORDER — IOHEXOL 350 MG/ML SOLN
100.0000 mL | Freq: Once | INTRAVENOUS | Status: AC | PRN
  Administered 2022-06-27: 100 mL via INTRAVENOUS

## 2022-07-04 ENCOUNTER — Ambulatory Visit: Payer: Medicare Other | Admitting: Vascular Surgery

## 2022-07-05 ENCOUNTER — Other Ambulatory Visit: Payer: Self-pay

## 2022-07-05 DIAGNOSIS — I7409 Other arterial embolism and thrombosis of abdominal aorta: Secondary | ICD-10-CM

## 2022-07-05 DIAGNOSIS — I70229 Atherosclerosis of native arteries of extremities with rest pain, unspecified extremity: Secondary | ICD-10-CM

## 2022-07-14 ENCOUNTER — Other Ambulatory Visit: Payer: Self-pay

## 2022-07-14 ENCOUNTER — Ambulatory Visit (HOSPITAL_COMMUNITY)
Admission: RE | Admit: 2022-07-14 | Discharge: 2022-07-14 | Disposition: A | Discharge status: Home / Self Care | Payer: Medicare Other | Source: Ambulatory Visit | Attending: Vascular Surgery | Admitting: Vascular Surgery

## 2022-07-14 DIAGNOSIS — Z539 Procedure and treatment not carried out, unspecified reason: Secondary | ICD-10-CM | POA: Diagnosis not present

## 2022-07-14 DIAGNOSIS — I70229 Atherosclerosis of native arteries of extremities with rest pain, unspecified extremity: Secondary | ICD-10-CM

## 2022-07-14 LAB — POCT I-STAT, CHEM 8
BUN: 19 mg/dL (ref 8–23)
Calcium, Ion: 1.26 mmol/L (ref 1.15–1.40)
Chloride: 105 mmol/L (ref 98–111)
Creatinine, Ser: 0.9 mg/dL (ref 0.44–1.00)
Glucose, Bld: 110 mg/dL — ABNORMAL HIGH (ref 70–99)
HCT: 39 % (ref 36.0–46.0)
Hemoglobin: 13.3 g/dL (ref 12.0–15.0)
Potassium: 5.2 mmol/L — ABNORMAL HIGH (ref 3.5–5.1)
Sodium: 142 mmol/L (ref 135–145)
TCO2: 25 mmol/L (ref 22–32)

## 2022-07-14 LAB — PROTIME-INR
INR: 2.4 — ABNORMAL HIGH (ref 0.8–1.2)
Prothrombin Time: 25.5 seconds — ABNORMAL HIGH (ref 11.4–15.2)

## 2022-07-14 LAB — GLUCOSE, CAPILLARY: Glucose-Capillary: 114 mg/dL — ABNORMAL HIGH (ref 70–99)

## 2022-07-14 SURGERY — ABDOMINAL AORTOGRAM W/LOWER EXTREMITY
Anesthesia: LOCAL

## 2022-07-14 MED ORDER — SODIUM CHLORIDE 0.9 % IV SOLN: INTRAVENOUS | Status: DC

## 2022-07-14 NOTE — H&P (Signed)
Patient's INR 2.4 in preop area. She wil be rescheduled for angiogram next week with one of my partners.  Rande Brunt. Lenell Antu, MD [facility] Phone Number: [phone] 07/14/2022 11:32 AM

## 2022-07-18 ENCOUNTER — Encounter (HOSPITAL_COMMUNITY)
Admission: RE | Disposition: A | Discharge status: Home / Self Care | Payer: Self-pay | Source: Ambulatory Visit | Attending: Surgery

## 2022-07-18 ENCOUNTER — Other Ambulatory Visit: Payer: Self-pay

## 2022-07-18 ENCOUNTER — Ambulatory Visit (HOSPITAL_COMMUNITY)
Admission: RE | Admit: 2022-07-18 | Discharge: 2022-07-18 | Disposition: A | Discharge status: Home / Self Care | Payer: Medicare Other | Source: Ambulatory Visit | Attending: Surgery | Admitting: Surgery

## 2022-07-18 DIAGNOSIS — I70223 Atherosclerosis of native arteries of extremities with rest pain, bilateral legs: Secondary | ICD-10-CM | POA: Diagnosis not present

## 2022-07-18 DIAGNOSIS — Z87891 Personal history of nicotine dependence: Secondary | ICD-10-CM | POA: Insufficient documentation

## 2022-07-18 DIAGNOSIS — Z7984 Long term (current) use of oral hypoglycemic drugs: Secondary | ICD-10-CM | POA: Diagnosis not present

## 2022-07-18 DIAGNOSIS — I739 Peripheral vascular disease, unspecified: Secondary | ICD-10-CM

## 2022-07-18 DIAGNOSIS — Z79899 Other long term (current) drug therapy: Secondary | ICD-10-CM | POA: Insufficient documentation

## 2022-07-18 DIAGNOSIS — I708 Atherosclerosis of other arteries: Secondary | ICD-10-CM | POA: Insufficient documentation

## 2022-07-18 DIAGNOSIS — Z7982 Long term (current) use of aspirin: Secondary | ICD-10-CM | POA: Insufficient documentation

## 2022-07-18 DIAGNOSIS — E1151 Type 2 diabetes mellitus with diabetic peripheral angiopathy without gangrene: Secondary | ICD-10-CM | POA: Insufficient documentation

## 2022-07-18 HISTORY — PX: PERIPHERAL VASCULAR INTERVENTION: CATH118257

## 2022-07-18 HISTORY — PX: ABDOMINAL AORTOGRAM W/LOWER EXTREMITY: CATH118223

## 2022-07-18 LAB — POCT I-STAT, CHEM 8
BUN: 17 mg/dL (ref 8–23)
Calcium, Ion: 1.14 mmol/L — ABNORMAL LOW (ref 1.15–1.40)
Chloride: 108 mmol/L (ref 98–111)
Creatinine, Ser: 1.1 mg/dL — ABNORMAL HIGH (ref 0.44–1.00)
Glucose, Bld: 127 mg/dL — ABNORMAL HIGH (ref 70–99)
HCT: 41 % (ref 36.0–46.0)
Hemoglobin: 13.9 g/dL (ref 12.0–15.0)
Potassium: 4.9 mmol/L (ref 3.5–5.1)
Sodium: 141 mmol/L (ref 135–145)
TCO2: 22 mmol/L (ref 22–32)

## 2022-07-18 LAB — POCT ACTIVATED CLOTTING TIME: Activated Clotting Time: 261 seconds

## 2022-07-18 LAB — GLUCOSE, CAPILLARY: Glucose-Capillary: 94 mg/dL (ref 70–99)

## 2022-07-18 LAB — PROTIME-INR
INR: 1.3 — ABNORMAL HIGH (ref 0.8–1.2)
Prothrombin Time: 15.6 seconds — ABNORMAL HIGH (ref 11.4–15.2)

## 2022-07-18 SURGERY — ABDOMINAL AORTOGRAM W/LOWER EXTREMITY
Anesthesia: LOCAL

## 2022-07-18 MED ORDER — SODIUM CHLORIDE 0.9% FLUSH: 3.0000 mL | INTRAVENOUS | Status: DC | PRN

## 2022-07-18 MED ORDER — HEPARIN (PORCINE) IN NACL 1000-0.9 UT/500ML-% IV SOLN
INTRAVENOUS | Status: DC | PRN
  Administered 2022-07-18: 1000 mL

## 2022-07-18 MED ORDER — SODIUM CHLORIDE 0.9 % IV SOLN
INTRAVENOUS | Status: DC | PRN
  Administered 2022-07-18: 250 mL via INTRAVENOUS

## 2022-07-18 MED ORDER — LABETALOL HCL 5 MG/ML IV SOLN: 10.0000 mg | INTRAVENOUS | Status: DC | PRN

## 2022-07-18 MED ORDER — SODIUM CHLORIDE 0.9 % IV SOLN: INTRAVENOUS | Status: DC

## 2022-07-18 MED ORDER — FENTANYL CITRATE (PF) 100 MCG/2ML IJ SOLN
INTRAMUSCULAR | Status: AC
  Filled 2022-07-18: qty 2

## 2022-07-18 MED ORDER — ROSUVASTATIN CALCIUM 10 MG PO TABS: 10.0000 mg | ORAL_TABLET | Freq: Every day | ORAL | 11 refills | Status: AC

## 2022-07-18 MED ORDER — ONDANSETRON HCL 4 MG/2ML IJ SOLN: 4.0000 mg | Freq: Four times a day (QID) | INTRAMUSCULAR | Status: DC | PRN

## 2022-07-18 MED ORDER — OXYCODONE HCL 5 MG PO TABS
5.0000 mg | ORAL_TABLET | ORAL | Status: DC | PRN
  Administered 2022-07-18: 10 mg via ORAL
  Filled 2022-07-18: qty 2

## 2022-07-18 MED ORDER — SODIUM CHLORIDE 0.9 % WEIGHT BASED INFUSION: 1.0000 mL/kg/h | INTRAVENOUS | Status: DC

## 2022-07-18 MED ORDER — HYDRALAZINE HCL 20 MG/ML IJ SOLN: 5.0000 mg | INTRAMUSCULAR | Status: DC | PRN

## 2022-07-18 MED ORDER — CLOPIDOGREL BISULFATE 300 MG PO TABS
ORAL_TABLET | ORAL | Status: AC
  Filled 2022-07-18: qty 1

## 2022-07-18 MED ORDER — IODIXANOL 320 MG/ML IV SOLN
INTRAVENOUS | Status: DC | PRN
  Administered 2022-07-18: 140 mL via INTRA_ARTERIAL

## 2022-07-18 MED ORDER — MORPHINE SULFATE (PF) 2 MG/ML IV SOLN: 2.0000 mg | INTRAVENOUS | Status: DC | PRN

## 2022-07-18 MED ORDER — LIDOCAINE HCL (PF) 1 % IJ SOLN
INTRAMUSCULAR | Status: AC
  Filled 2022-07-18: qty 30

## 2022-07-18 MED ORDER — SODIUM CHLORIDE 0.9 % IV SOLN: 250.0000 mL | INTRAVENOUS | Status: DC | PRN

## 2022-07-18 MED ORDER — SODIUM CHLORIDE 0.9% FLUSH: 3.0000 mL | Freq: Two times a day (BID) | INTRAVENOUS | Status: DC

## 2022-07-18 MED ORDER — MIDAZOLAM HCL 2 MG/2ML IJ SOLN
INTRAMUSCULAR | Status: DC | PRN
  Administered 2022-07-18 (×2): 1 mg via INTRAVENOUS

## 2022-07-18 MED ORDER — LIDOCAINE HCL (PF) 1 % IJ SOLN
INTRAMUSCULAR | Status: DC | PRN
  Administered 2022-07-18: 15 mL via INTRADERMAL

## 2022-07-18 MED ORDER — CLOPIDOGREL BISULFATE 300 MG PO TABS
ORAL_TABLET | ORAL | Status: DC | PRN
  Administered 2022-07-18: 300 mg via ORAL

## 2022-07-18 MED ORDER — ACETAMINOPHEN 325 MG PO TABS: 650.0000 mg | ORAL_TABLET | ORAL | Status: DC | PRN

## 2022-07-18 MED ORDER — ASPIRIN 81 MG PO TBEC: 81.0000 mg | DELAYED_RELEASE_TABLET | Freq: Every day | ORAL | 2 refills | Status: DC

## 2022-07-18 MED ORDER — ASPIRIN 81 MG PO TBEC: 81.0000 mg | DELAYED_RELEASE_TABLET | Freq: Every day | ORAL | Status: DC

## 2022-07-18 MED ORDER — MIDAZOLAM HCL 2 MG/2ML IJ SOLN
INTRAMUSCULAR | Status: AC
  Filled 2022-07-18: qty 2

## 2022-07-18 MED ORDER — HEPARIN (PORCINE) IN NACL 1000-0.9 UT/500ML-% IV SOLN
INTRAVENOUS | Status: AC
  Filled 2022-07-18: qty 1000

## 2022-07-18 MED ORDER — HEPARIN SODIUM (PORCINE) 1000 UNIT/ML IJ SOLN
INTRAMUSCULAR | Status: AC
  Filled 2022-07-18: qty 10

## 2022-07-18 MED ORDER — HEPARIN SODIUM (PORCINE) 1000 UNIT/ML IJ SOLN
INTRAMUSCULAR | Status: DC | PRN
  Administered 2022-07-18: 7000 [IU] via INTRAVENOUS

## 2022-07-18 MED ORDER — FENTANYL CITRATE (PF) 100 MCG/2ML IJ SOLN
INTRAMUSCULAR | Status: DC | PRN
  Administered 2022-07-18: 25 ug via INTRAVENOUS

## 2022-07-18 MED ORDER — ASPIRIN 81 MG PO CHEW
CHEWABLE_TABLET | ORAL | Status: AC
  Filled 2022-07-18: qty 1

## 2022-07-18 MED ORDER — ASPIRIN 81 MG PO CHEW
CHEWABLE_TABLET | ORAL | Status: DC | PRN
  Administered 2022-07-18: 81 mg via ORAL

## 2022-07-18 SURGICAL SUPPLY — 22 items
BALLN MUSTANG 6X100X135 (BALLOONS) ×2
BALLOON MUSTANG 6X100X135 (BALLOONS) IMPLANT
CATH OMNI FLUSH 5F 65CM (CATHETERS) IMPLANT
CATH SOFT-VU 4F 65 STRAIGHT (CATHETERS) IMPLANT
CATH SOFT-VU STRAIGHT 4F 65CM (CATHETERS) ×2
DEVICE VASC CLSR CELT ART 7 (Vascular Products) IMPLANT
GLIDEWIRE ADV .035X260CM (WIRE) IMPLANT
KIT ENCORE 40 (KITS) IMPLANT
KIT MICROPUNCTURE NIT STIFF (SHEATH) IMPLANT
KIT PV (KITS) ×2 IMPLANT
SHEATH CATAPULT 7F 45 MP (SHEATH) IMPLANT
SHEATH PINNACLE 5F 10CM (SHEATH) IMPLANT
SHEATH PINNACLE 7F 10CM (SHEATH) IMPLANT
SHEATH PROBE COVER 6X72 (BAG) IMPLANT
STENT ELUVIA 7X60X130 (Permanent Stent) IMPLANT
STENT VIABAHN 8X29X80 VBX (Permanent Stent) IMPLANT
STENT VIABAHNBX 8X59X80 (Permanent Stent) IMPLANT
SYR MEDRAD MARK V 150ML (SYRINGE) IMPLANT
TRANSDUCER W/STOPCOCK (MISCELLANEOUS) ×2 IMPLANT
TRAY PV CATH (CUSTOM PROCEDURE TRAY) ×2 IMPLANT
WIRE BENTSON .035X145CM (WIRE) IMPLANT
WIRE ROSEN-J .035X260CM (WIRE) IMPLANT

## 2022-07-18 NOTE — H&P (Signed)
[facility] [place]   ASSESSMENT / PLAN: Jill Crane is a 82 y.o. 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   patients with chronic limb threatening ischemia have an annual risk of cardiovascular mortality of 25% and a high risk of amputation.      Recommend the following which can slow the progression of atherosclerosis and reduce the risk of major adverse cardiac / limb events:  Complete cessation from all tobacco products. Blood glucose control with goal A1c < 7%. Blood pressure control with goal blood pressure < 140/90 mmHg. Lipid reduction therapy with goal LDL-C <100 mg/dL (<70 if symptomatic from PAD).  Aspirin 81mg  PO QD.  Atorvastatin 40-80mg  PO QD (or other "high intensity" statin therapy).   Plan CT angiogram of abdomen and pelvis with runoff to the toes as soon as possible I will call her to strategize a plan for revascularization as soon as the results are available to me.   CHIEF COMPLAINT: Left worse than right foot pain   HISTORY OF PRESENT ILLNESS: Jill Crane is a 82 y.o. female referred to clinic with a fairly classic history of ischemic rest pain.  The patient reports distal foot pain which is constant, and has no alleviating features.  The pain wakes her up from sleep sometimes.  She has no ulcers about her feet.  Her pain is worsened by walking.       Past Medical History:  Diagnosis Date   Atrial fibrillation with normal ventricular rate (HCC)     CHF (congestive heart failure) (HCC)     COPD (chronic obstructive pulmonary disease) ([place])     Diabetes mellitus without complication ([place])     Hx of blood clots     Hypertension     Pacemaker     Sepsis ([place]) 11/10/2019   SIRS (systemic inflammatory response syndrome) ([place]) 08/15/2020           Past Surgical History:  Procedure Laterality Date   CHOLECYSTECTOMY N/A 04/12/2022    Procedure: LAPAROSCOPIC CHOLECYSTECTOMY WITH  ICG;  Surgeon: Leighton Ruff, MD;  Location: [facility];  Service: General;  Laterality: N/A;   ENDOSCOPIC RETROGRADE CHOLANGIOPANCREATOGRAPHY (ERCP) WITH PROPOFOL N/A 01/22/2021    Procedure: ENDOSCOPIC RETROGRADE CHOLANGIOPANCREATOGRAPHY (ERCP) WITH PROPOFOL;  Surgeon: Irene Shipper, MD;  Location: [facility] ENDOSCOPY;  Service: Endoscopy;  Laterality: N/A;   ESOPHAGOGASTRODUODENOSCOPY (EGD) WITH PROPOFOL N/A 01/28/2021    Procedure: ESOPHAGOGASTRODUODENOSCOPY (EGD) WITH PROPOFOL;  Surgeon: Gatha Mayer, MD;  Location: [facility];  Service: Endoscopy;  Laterality: N/A;   IR PERC CHOLECYSTOSTOMY   01/30/2022   IR PERC CHOLECYSTOSTOMY   03/28/2022   REMOVAL OF STONES   01/22/2021    Procedure: REMOVAL OF STONES;  Surgeon: Irene Shipper, MD;  Location: [facility] ENDOSCOPY;  Service: Endoscopy;;   SPHINCTEROTOMY   01/22/2021    Procedure: Joan Mayans;  Surgeon: Irene Shipper, MD;  Location: [facility] ENDOSCOPY;  Service: Endoscopy;;           Family History  Problem Relation Age of Onset   Stroke Mother     Diabetes Mother     Stroke Father        Social History         Socioeconomic History   Marital status: Widowed      Spouse name: Not on file   Number of children: Not on file   Years of education: Not on file   Highest  education level: Not on file  Occupational History   Not on file  Tobacco Use   Smoking status: Former      Types: Cigarettes   Smokeless tobacco: Never  Vaping Use   Vaping Use: Never used  Substance and Sexual Activity   Alcohol use: No   Drug use: Never   Sexual activity: Not on file  Other Topics Concern   Not on file  Social History Narrative   Not on file    Social Determinants of Health        Financial Resource Strain: Not on file  Food Insecurity: No Food Insecurity (04/08/2022)    Hunger Vital Sign     Worried About Running Out of Food in the Last Year: Never true     Ran Out of Food in the Last Year: Never true  Transportation Needs: No Transportation Needs  (04/08/2022)    PRAPARE - Therapist, art (Medical): No     Lack of Transportation (Non-Medical): No  Physical Activity: Not on file  Stress: Not on file  Social Connections: Not on file  Intimate Partner Violence: Not At Risk (04/08/2022)    Humiliation, Afraid, Rape, and Kick questionnaire     Fear of Current or Ex-Partner: No     Emotionally Abused: No     Physically Abused: No     Sexually Abused: No      No Known Allergies         Current Outpatient Medications  Medication Sig Dispense Refill   acetaminophen (TYLENOL) 500 MG tablet Take 500 mg by mouth every 6 (six) hours as needed for mild pain.       alendronate (FOSAMAX) 35 MG tablet Take 35 mg by mouth every Monday. Take with a full glass of water on an empty stomach.       bimatoprost (LUMIGAN) 0.03 % ophthalmic solution Place 1 drop into both eyes at bedtime.       brimonidine (ALPHAGAN) 0.2 % ophthalmic solution Place 1 drop into the left eye in the morning and at bedtime.       busPIRone (BUSPAR) 10 MG tablet Take 10 mg by mouth 2 (two) times daily.       calcium-vitamin D (OSCAL WITH D) 500-200 MG-UNIT per tablet Take 1 tablet by mouth.       dorzolamide (TRUSOPT) 2 % ophthalmic solution Place 1 drop into the left eye 2 (two) times daily.       fluocinonide ointment (LIDEX) 0.05 % Apply 1 Application topically 2 (two) times daily as needed (scalp irritation).       fluticasone (FLONASE) 50 MCG/ACT nasal spray Place 2 sprays into both nostrils daily as needed for allergies.       furosemide (LASIX) 20 MG tablet Take 1 tablet (20 mg total) by mouth daily. (Patient taking differently: Take 20 mg by mouth daily as needed (swelling).)       gabapentin (NEURONTIN) 300 MG capsule Take 300 mg by mouth at bedtime.       HYDROcodone-acetaminophen (NORCO/VICODIN) 5-325 MG tablet Take 1 tablet by mouth every 6 (six) hours as needed for severe pain. 15 tablet 0   magnesium oxide (MAG-OX) 400 (241.3 Mg) MG  tablet Take 400 mg by mouth in the morning and at bedtime.       metFORMIN (GLUCOPHAGE) 500 MG tablet Take 500 mg by mouth 2 (two) times daily.       metoprolol succinate (TOPROL-XL) 25  MG 24 hr tablet Take 12.5 mg by mouth daily.       omeprazole (PRILOSEC) 20 MG capsule Take 20 mg by mouth daily.       predniSONE (DELTASONE) 2.5 MG tablet Take 7.5 mg by mouth daily.       silver sulfADIAZINE (SILVADENE) 1 % cream Apply 1 Application topically daily as needed (scalp irritation).       warfarin (COUMADIN) 2 MG tablet On Saturday and Sunday, take 2 tabs (4mg ), then resume normal regimen Take 1 tablet (2 mg) daily except Take 2 tablets (4 mg) on (Tues & Fri) (Patient taking differently: Take 2-4 mg by mouth daily. Takes 2 mg (1 tab) daily except 4 mg (2 tab) on Tues and Thurs) 30 tablet 1   albuterol (VENTOLIN HFA) 108 (90 Base) MCG/ACT inhaler Inhale 2 puffs into the lungs every 6 (six) hours as needed for wheezing or shortness of breath. (Patient not taking: Reported on 04/07/2022)        No current facility-administered medications for this visit.      PHYSICAL EXAM    Vitals:    05/30/22 1534  BP: 105/70  Pulse: 68  Resp: 20  Temp: 97.9 F (36.6 C)  SpO2: 95%  Weight: 153 lb (69.4 kg)  Height: 5\' 2"  (1.575 m)      Chronically ill elderly woman in no acute distress Regular rate and rhythm Unlabored breathing No palpable pedal pulses No ulcers about the feet     PERTINENT LABORATORY AND RADIOLOGIC DATA   Most recent CBC     Latest Ref Rng & Units 04/14/2022    6:33 AM 04/13/2022    6:13 AM 04/12/2022    6:06 AM  CBC  WBC 4.0 - 10.5 K/uL 16.8  18.6  7.9   Hemoglobin 12.0 - 15.0 g/dL 11.7  11.3  11.7   Hematocrit 36.0 - 46.0 % 37.2  36.9  37.8   Platelets 150 - 400 K/uL 280  279  312       Most recent CMP     Latest Ref Rng & Units 04/14/2022    6:33 AM 04/13/2022    6:13 AM 04/12/2022    6:06 AM  CMP  Glucose 70 - 99 mg/dL 85  104  81   BUN 8 - 23 mg/dL 18  13  17     Creatinine 0.44 - 1.00 mg/dL 0.85  0.68  0.79   Sodium 135 - 145 mmol/L 139  139  139   Potassium 3.5 - 5.1 mmol/L 4.5  4.7  4.1   Chloride 98 - 111 mmol/L 106  108  109   CO2 22 - 32 mmol/L 23  23  23    Calcium 8.9 - 10.3 mg/dL 9.4  9.0  9.0   Total Protein 6.5 - 8.1 g/dL 7.2  6.7  6.6   Total Bilirubin 0.3 - 1.2 mg/dL 0.8  0.6  0.5   Alkaline Phos 38 - 126 U/L 57  49  47   AST 15 - 41 U/L 38  36  15   ALT 0 - 44 U/L 26  25  14        Renal function CrCl cannot be calculated (Patient's most recent lab result is older than the maximum 21 days allowed.).   Last Labs     Hgb A1c MFr Bld (%)  Date Value  01/31/2022 6.8 (H)        +-------+-----------+-----------+------------+------------+  ABI/TBIToday's ABIToday's TBIPrevious ABIPrevious TBI  +-------+-----------+-----------+------------+------------+  Right 0.66                                            +-------+-----------+-----------+------------+------------+  Left  0.34                                            +-------+-----------+-----------+------------+------------+   Recent CT scan of the abdomen pelvis performed for cholecystitis personally reviewed.  Heavy burden of calcific aortoiliac plaque. Left iliac system appears occluded. Right iliacs stenotic.    Angio cancelled last week due to elevated INR.  She has been off coumadin.  She continues to have bilateral claudication, left > right.  Discussed proceeding with angio and intervention if indicated.  All questions answered.  Durene Cal

## 2022-07-18 NOTE — Op Note (Signed)
Patient name: Jill Crane MRN: [id number] DOB: 02-15-1941 Sex: female  07/18/2022 Pre-operative Diagnosis: Bilateral claudication Post-operative diagnosis:  Same Surgeon:  Annamarie Major Procedure Performed:  1.  Ultrasound-guided access, right femoral artery  2.  Abdominal aortogram  3.  Bilateral lower extremity runoff  4.  Stent, right common iliac artery  5.  Stent, right extrailiac artery  6.  Conscious sedation, 73 minutes  7.  Closure device, Celt   Indications: This is an 82 year old female with bilateral claudication, left greater than right.  She has a CT scan that shows inflow disease on the left as well as bilateral outflow disease.  She is here for possible percutaneous intervention.  Procedure:  The patient was identified in the holding area and taken to room 8.  The patient was then placed supine on the table and prepped and draped in the usual sterile fashion.  A time out was called.  Conscious sedation was administered with the use of IV fentanyl and Versed under continuous physician and nurse monitoring.  Heart rate, blood pressure, and oxygen saturation were continuously monitored.  Total sedation time was 73 minutes.  Ultrasound was used to evaluate the right common femoral artery.  It was patent .  A digital ultrasound image was acquired.  A micropuncture needle was used to access the right common femoral artery under ultrasound guidance.  An 018 wire was advanced without resistance and a micropuncture sheath was placed.  The 018 wire was removed and a benson wire was placed.  The micropuncture sheath was exchanged for a 5 french sheath.  An omniflush catheter was advanced over the wire to the level of L-1.  An abdominal angiogram was obtained.  Next, the catheter was pulled out of the aortic bifurcation pelvic angiograms were performed followed by bilateral runoff  Findings:   Aortogram: Diminutive appearing renal arteries bilaterally.  The infrarenal abdominal aorta is  heavily calcified without hemodynamically significant stenosis.  The right common and external iliac arteries are heavily calcified without hemodynamically significant stenosis.  There is diffuse disease within the left common iliac artery which narrows down distally.  There is a small remnant of the hypogastric artery which does give rise to collaterals on delayed imaging.  The external iliac artery is nearly occluded with approximately 90% stenosis throughout.  Right Lower Extremity: The common femoral and profundofemoral artery are patent but calcified.  There is a flush occlusion of the superficial femoral artery.  There is reconstitution of the diseased above-knee popliteal artery with two-vessel runoff via the posterior tibial and anterior tibial artery  Left Lower Extremity: The left common femoral artery and profundofemoral artery are patent throughout their course.  There is a flush occlusion of the superficial femoral artery with reconstitution of the above-knee popliteal artery which is diffusely diseased.  There is three-vessel runoff  Intervention: After the above images were acquired the decision was made to proceed with intervention.  Using the Omni Flush catheter and a Glidewire advantage, I was able to to cross the stenosis and get wire access into the profundofemoral artery on the left.  Next, a 7 x 45 catapulted sheath was advanced into the left common iliac artery.  The patient was then fully heparinized.  I then predilated the lesion with a 6 x 100 Mustang balloon.  I elected to primarily stent the lesion.  After predilation with the balloon, there was no further appearance of the hypogastric artery.  I placed a 7 x 60 Elluvia stent  in the external iliac artery.  I then extended proximally with a 8 x 59 and 8 x 29 VBX stents.  The external iliac self-expanding stent was postdilated with the 8 mm balloon from the [place] stent.  Completion imaging was then performed which showed complete  resolution of the iliac lesions on the left.  Catheters and wires were removed.  The 7 [place] catapulted sheath which exchanged for a 7 French short sheath and a Celt was used for closure.  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  #3  Two-vessel runoff on the right via the anterior tibial and posterior tibial artery.  #4  Three-view runoff on the left which are diseased Because the patient is on Coumadin, I will give her a loading dose of 300 mg of Plavix but I will not prescribe her Plavix.  I will start her on aspirin 81 mg.  She will resume her Coumadin today  V. Annamarie Major, M.D., [facility] [place] Office: [phone] Pager:  [phone]

## 2022-07-19 ENCOUNTER — Encounter (HOSPITAL_COMMUNITY): Payer: Self-pay | Admitting: Surgery

## 2022-07-24 ENCOUNTER — Telehealth: Payer: Self-pay | Admitting: Surgery

## 2022-07-24 NOTE — Telephone Encounter (Signed)
-----   Message from Serafina Mitchell, MD sent at 07/18/2022 10:16 AM EST ----- 07/18/2022:      Surgeon:  Annamarie Major Procedure Performed:  1.  Ultrasound-guided access, right femoral artery  2.  Abdominal aortogram  3.  Bilateral lower extremity runoff  4.  Stent, right common iliac artery  5.  Stent, right extrailiac artery  6.  Conscious sedation, 73 minutes  7.  Closure device, Celt   F/u [place] 1 month with ABI and iliac artery duplex

## 2022-08-17 NOTE — Progress Notes (Signed)
Patient having vascular surgery.

## 2022-09-15 ENCOUNTER — Other Ambulatory Visit: Payer: Self-pay | Admitting: *Deleted

## 2022-09-15 DIAGNOSIS — I7409 Other arterial embolism and thrombosis of abdominal aorta: Secondary | ICD-10-CM

## 2022-09-26 ENCOUNTER — Ambulatory Visit (INDEPENDENT_AMBULATORY_CARE_PROVIDER_SITE_OTHER): Payer: Medicare Other | Admitting: Physician Assistant

## 2022-09-26 ENCOUNTER — Ambulatory Visit (HOSPITAL_COMMUNITY)
Admission: RE | Admit: 2022-09-26 | Discharge: 2022-09-26 | Disposition: A | Discharge status: Home / Self Care | Payer: Medicare Other | Source: Ambulatory Visit | Attending: Vascular Surgery | Admitting: Vascular Surgery

## 2022-09-26 ENCOUNTER — Ambulatory Visit (INDEPENDENT_AMBULATORY_CARE_PROVIDER_SITE_OTHER)
Admission: RE | Admit: 2022-09-26 | Discharge: 2022-09-26 | Disposition: A | Discharge status: Home / Self Care | Payer: Medicare Other | Source: Ambulatory Visit | Attending: Vascular Surgery | Admitting: Vascular Surgery

## 2022-09-26 VITALS — BP 92/65 | HR 99 | Temp 97.2°F | Wt 154.1 lb

## 2022-09-26 DIAGNOSIS — I7409 Other arterial embolism and thrombosis of abdominal aorta: Secondary | ICD-10-CM | POA: Insufficient documentation

## 2022-09-26 DIAGNOSIS — I70229 Atherosclerosis of native arteries of extremities with rest pain, unspecified extremity: Secondary | ICD-10-CM | POA: Diagnosis not present

## 2022-09-26 LAB — VAS US ABI WITH/WO TBI
Left ABI: 0.92
Right ABI: 0.84

## 2022-09-26 NOTE — Progress Notes (Signed)
[facility] [place] HISTORY AND PHYSICAL   History of Present Illness:  Patient is a 82 y.o. year old female who presents for evaluation of  PAD.  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  She is now s/p right common and external iliac stent.    She denise rest pain and non healing wounds.  Her claudication symptoms are improved on the left LE.    Angiogram findings:  Right Lower Extremity: The common femoral and profundofemoral artery are patent but calcified.  There is a flush occlusion of the superficial femoral artery.  There is reconstitution of the diseased above-knee popliteal artery with two-vessel runoff via the posterior tibial and anterior tibial artery             Left Lower Extremity: The left common femoral artery and profundofemoral artery are patent throughout their course.  There is a flush occlusion of the superficial femoral artery with reconstitution of the above-knee popliteal artery which is diffusely diseased.  There is three-vessel runoff   Past Medical History:  Diagnosis Date   Atrial fibrillation with normal ventricular rate (HCC)    CHF (congestive heart failure) (HCC)    COPD (chronic obstructive pulmonary disease) (HCC)    Diabetes mellitus without complication ([place])    Hx of blood clots    Hypertension    Pacemaker    Sepsis ([place]) 11/10/2019   SIRS (systemic inflammatory response syndrome) ([place]) 08/15/2020    Past Surgical History:  Procedure Laterality Date   ABDOMINAL AORTOGRAM W/LOWER EXTREMITY N/A 07/18/2022   Procedure: ABDOMINAL AORTOGRAM W/ Bilateral LOWER EXTREMITY Runoff;  Surgeon: Serafina Mitchell, MD;  Location: [facility];  Service: Cardiovascular;  Laterality: N/A;   CHOLECYSTECTOMY N/A 04/12/2022   Procedure: LAPAROSCOPIC CHOLECYSTECTOMY WITH ICG;  Surgeon: Leighton Ruff, MD;  Location: [facility];  Service: General;  Laterality: N/A;   ENDOSCOPIC RETROGRADE  CHOLANGIOPANCREATOGRAPHY (ERCP) WITH PROPOFOL N/A 01/22/2021   Procedure: ENDOSCOPIC RETROGRADE CHOLANGIOPANCREATOGRAPHY (ERCP) WITH PROPOFOL;  Surgeon: Irene Shipper, MD;  Location: [facility] END[facility];  Service: Endoscopy;  Laterality: N/A;   ESOPHAGOGASTRODUODENOSCOPY (EGD) WITH PROPOFOL N/A 01/28/2021   Procedure: ESOPHAGOGASTRODUODENOSCOPY (EGD) WITH PROPOFOL;  Surgeon: Gatha Mayer, MD;  Location: [facility];  Service: Endoscopy;  Laterality: N/A;   IR PERC CHOLECYSTOSTOMY  01/30/2022   IR PERC CHOLECYSTOSTOMY  03/28/2022   PERIPHERAL VASCULAR INTERVENTION Left 07/18/2022   Procedure: PERIPHERAL VASCULAR INTERVENTION;  Surgeon: Serafina Mitchell, MD;  Location: [place] [facility];  Service: Cardiovascular;  Laterality: Left;  left common iliac artery and left external iliac artery   REMOVAL OF STONES  01/22/2021   Procedure: REMOVAL OF STONES;  Surgeon: Irene Shipper, MD;  Location: [facility];  Service: Endoscopy;;   SPHINCTEROTOMY  01/22/2021   Procedure: SPHINCTEROTOMY;  Surgeon: Irene Shipper, MD;  Location: [facility] ENDOSCOPY;  Service: Endoscopy;;    ROS:   General:  No weight loss, Fever, chills  HEENT: No recent headaches, no nasal bleeding, no visual changes, no sore throat  Neurologic: No dizziness, blackouts, seizures. No recent symptoms of stroke or mini- stroke. No recent episodes of slurred speech, or temporary blindness.  Cardiac: No recent episodes of chest pain/pressure, no shortness of breath at rest.  No shortness of breath with exertion.  Denies history of atrial fibrillation or irregular heartbeat  Vascular: No history of rest pain in feet.  No history of claudication.  No history of non-healing ulcer,  No history of DVT   Pulmonary: No home oxygen, no productive cough, no hemoptysis,  No asthma or wheezing  Musculoskeletal:  '[ ]'$  Arthritis, '[ ]'$  Low back pain,  '[ ]'$  Joint pain  Hematologic:No history of hypercoagulable state.  No history of easy bleeding.  No history of  anemia  Gastrointestinal: No hematochezia or melena,  No gastroesophageal reflux, no trouble swallowing  Urinary: '[ ]'$  chronic Kidney disease, '[ ]'$  on HD - '[ ]'$  MWF or '[ ]'$  TTHS, '[ ]'$  Burning with urination, '[ ]'$  Frequent urination, '[ ]'$  Difficulty urinating;   Skin: No rashes  Psychological: No history of anxiety,  No history of depression  Social History Social History   Tobacco Use   Smoking status: Former    Types: Cigarettes   Smokeless tobacco: Never  Vaping Use   Vaping Use: Never used  Substance Use Topics   Alcohol use: No   Drug use: Never    Family History Family History  Problem Relation Age of Onset   Stroke Mother    Diabetes Mother    Stroke Father     Allergies  No Known Allergies   Current Outpatient Medications  Medication Sig Dispense Refill   acetaminophen (TYLENOL) 500 MG tablet Take 500 mg by mouth every 6 (six) hours as needed for mild pain.     albuterol (VENTOLIN HFA) 108 (90 Base) MCG/ACT inhaler Inhale 2 puffs into the lungs every 6 (six) hours as needed for wheezing or shortness of breath.     alendronate (FOSAMAX) 35 MG tablet Take 35 mg by mouth every Monday. Take with a full glass of water on an empty stomach.     bimatoprost (LUMIGAN) 0.01 % SOLN Place 1 drop into both eyes at bedtime.     brimonidine (ALPHAGAN) 0.2 % ophthalmic solution Place 1 drop into the left eye in the morning and at bedtime.     busPIRone (BUSPAR) 10 MG tablet Take 10 mg by mouth 2 (two) times daily.     calcium-vitamin D (OSCAL WITH D) 500-200 MG-UNIT per tablet Take 1 tablet by mouth daily.     dorzolamide (TRUSOPT) 2 % ophthalmic solution Place 1 drop into the left eye 2 (two) times daily.     fluocinonide ointment (LIDEX) [id number] % Apply 1 Application topically 2 (two) times daily as needed (scalp irritation).     fluticasone (FLONASE) 50 MCG/ACT nasal spray Place 2 sprays into both nostrils daily as needed for allergies.     furosemide (LASIX) 20 MG tablet Take 1  tablet (20 mg total) by mouth daily. (Patient taking differently: Take 20 mg by mouth daily as needed (swelling).)     gabapentin (NEURONTIN) 300 MG capsule Take 300 mg by mouth at bedtime.     magnesium oxide (MAG-OX) 400 (241.3 Mg) MG tablet Take 400 mg by mouth in the morning and at bedtime.     metFORMIN (GLUCOPHAGE) 500 MG tablet Take 500 mg by mouth 2 (two) times daily.     metoprolol succinate (TOPROL-XL) 25 MG 24 hr tablet Take 25 mg by mouth daily.     omeprazole (PRILOSEC) 20 MG capsule Take 20 mg by mouth daily.     predniSONE (DELTASONE) 2.5 MG tablet Take 7.5 mg by mouth daily.     rosuvastatin (CRESTOR) 10 MG tablet Take 1 tablet (10 mg total) by mouth daily. 30 tablet 11   silver sulfADIAZINE (SILVADENE) 1 % cream Apply 1 Application topically daily as needed (scalp irritation).  warfarin (COUMADIN) 2 MG tablet On Saturday and 'Sunday, take 2 tabs (4mg), then resume normal regimen Take 1 tablet (2 mg) daily except Take 2 tablets (4 mg) on (Tues & Fri) (Patient taking differently: Take 2-4 mg by mouth daily. Takes 2 mg (1 tab) daily except 4 mg (2 tab) on Mon and Fri) 30 tablet 1   No current facility-administered medications for this visit.    Physical Examination  Vitals:   09/26/22 1028  BP: 92/65  Pulse: 99  Temp: (!) 97.2 F (36.2 C)  TempSrc: Temporal  SpO2: 98%  Weight: 154 lb 1.6 oz (69.9 kg)    Body mass index is 24.87 kg/m.  General:  Alert and oriented, no acute distress HEENT: Normal Neck: No bruit or JVD Pulmonary: Clear to auscultation bilaterally Cardiac: Regular Rate and Rhythm without murmur Abdomen: Soft, non-tender, non-distended, no mass, no scars Skin: No rash Extremity Pulses:  2+ radial, brachial, femoral, dorsalis pedis, posterior tibial pulses bilaterally Musculoskeletal: No deformity or edema  Neurologic: Upper and lower extremity motor 5/5 and symmetric  DATA:     ABI Findings:   +---------+------------------+-----+----------+--------+  Right   Rt Pressure (mmHg)IndexWaveform  Comment   +---------+------------------+-----+----------+--------+  Brachial 158                                        +---------+------------------+-----+----------+--------+  PTA     133               0.84 monophasic          +---------+------------------+-----+----------+--------+  DP      129               0.82 monophasic          +---------+------------------+-----+----------+--------+  Great Toe70                0.44                     +---------+------------------+-----+----------+--------+   +---------+------------------+-----+----------+-------+  Left    Lt Pressure (mmHg)IndexWaveform  Comment  +---------+------------------+-----+----------+-------+  Brachial 125                                       +---------+------------------+-----+----------+-------+  PTA     146               0.92 monophasic         +---------+------------------+-----+----------+-------+  DP      13'$ 3               0.84 monophasic         +---------+------------------+-----+----------+-------+  Great Toe77                0.49                    +---------+------------------+-----+----------+-------+   +-------+-----------+-----------+------------+------------+  ABI/TBIToday's ABIToday's TBIPrevious ABIPrevious TBI  +-------+-----------+-----------+------------+------------+  Right 0.84       0.44       0.66        0.31          +-------+-----------+-----------+------------+------------+  Left  0.92       0.49       0.34        absent        +-------+-----------+-----------+------------+------------+  Bilateral ABIs appear increased compared to prior study on 03/29/2012.    Summary:  Right: Resting right ankle-brachial index indicates mild right lower  extremity arterial disease. The right toe-brachial index is  abnormal.   Left: Resting left ankle-brachial index indicates mild left lower  extremity arterial disease. The left toe-brachial index is abnormal.    Left Stent(s):  +---------------+--------+--------+--------+--------+  EIA           PSV cm/sStenosisWaveformComments  +---------------+--------+--------+--------+--------+  Prox to Stent  103             biphasic          +---------------+--------+--------+--------+--------+  Proximal Stent 99              biphasic          +---------------+--------+--------+--------+--------+  Mid Stent      166             biphasic          +---------------+--------+--------+--------+--------+  Distal Stent   155             biphasic          +---------------+--------+--------+--------+--------+  Distal to Stent127             biphasic          +---------------+--------+--------+--------+--------+       +---------------+--------+--------+--------+--------+  CIA           PSV cm/sStenosisWaveformComments  +---------------+--------+--------+--------+--------+  Prox to Stent  95              biphasic          +---------------+--------+--------+--------+--------+  Proximal Stent 111             biphasic          +---------------+--------+--------+--------+--------+  Mid Stent      103             biphasic          +---------------+--------+--------+--------+--------+  Distal Stent   128             biphasic          +---------------+--------+--------+--------+--------+  Distal to Stent101             biphasic          +---------------+--------+--------+--------+--------+        Summary:  Stenosis: +-------------------+-----------+  Location           Stent        +-------------------+-----------+  Left Common Iliac  no stenosis  +-------------------+-----------+  Left External Iliacno stenosis  +-------------------+------   ASSESSMENT/PLAN:  PAD with  history of sever claudication left > right S/P  right common and external iliac stent.   Her symptoms have improved on the left.  Her ABI's show improvement from 0.34 to 0.92 today on the left.  The is also improvement on the right from 0.66 to.84.  She will continue to take ASA and Coumadin daily.  No Plavix was prescribed.   Plan for f/u in 6 months with aortoiliac left and ABI's.  If she develops ischemic symptoms she will call our office.      Roxy Horseman PA-C [facility] [place] Office: [phone]  MD in [facility] [place]

## 2022-09-27 ENCOUNTER — Other Ambulatory Visit: Payer: Self-pay

## 2022-09-27 DIAGNOSIS — I70229 Atherosclerosis of native arteries of extremities with rest pain, unspecified extremity: Secondary | ICD-10-CM

## 2022-09-27 DIAGNOSIS — I7409 Other arterial embolism and thrombosis of abdominal aorta: Secondary | ICD-10-CM

## 2022-10-31 IMAGING — RF DG ERCP WO/W SPHINCTEROTOMY
1 series · 8 of 8 positions shown · non-contrast
Comparison: None.

CLINICAL DATA: Biliary stones

EXAM:
ERCP
TECHNIQUE: Multiple spot images obtained with the fluoroscopic device and
submitted for interpretation post-procedure.
FLUOROSCOPY TIME:  Fluoroscopy Time:  3 minutes 7 seconds
Radiation Exposure Index (if provided by the fluoroscopic device):
53 mGy
Number of Acquired Spot Images: 8

[Series 1: unknown protocol · 0.20mm/px · 8 of 8 slices shown]
[im 1/8]
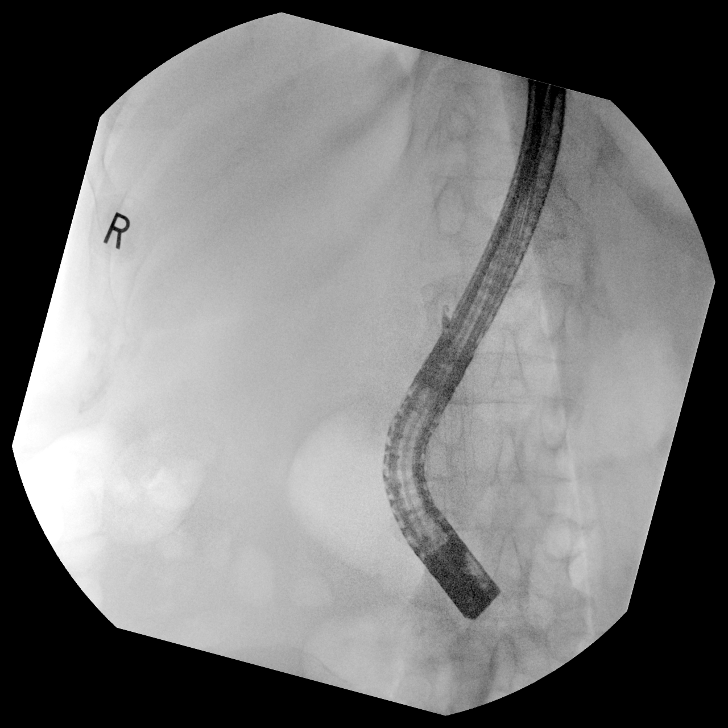
[im 2/8]
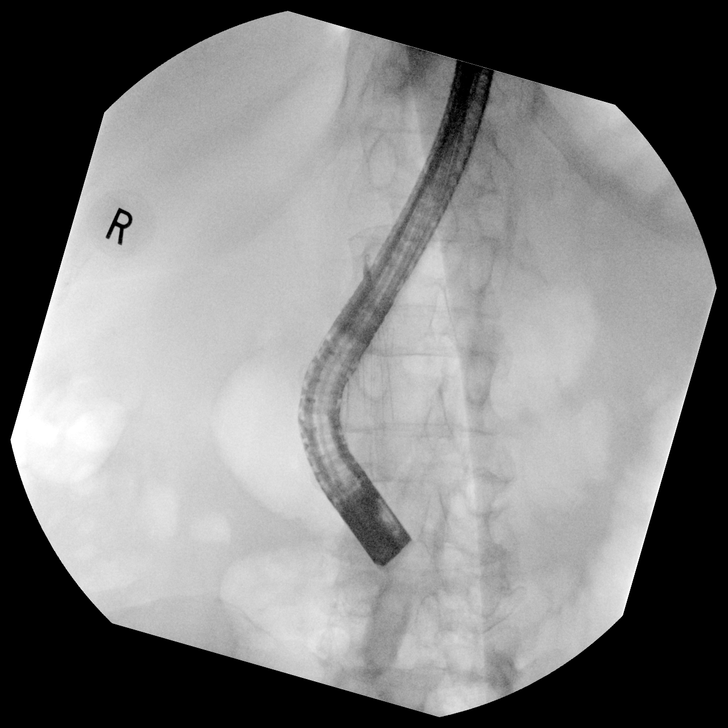
[im 3/8]
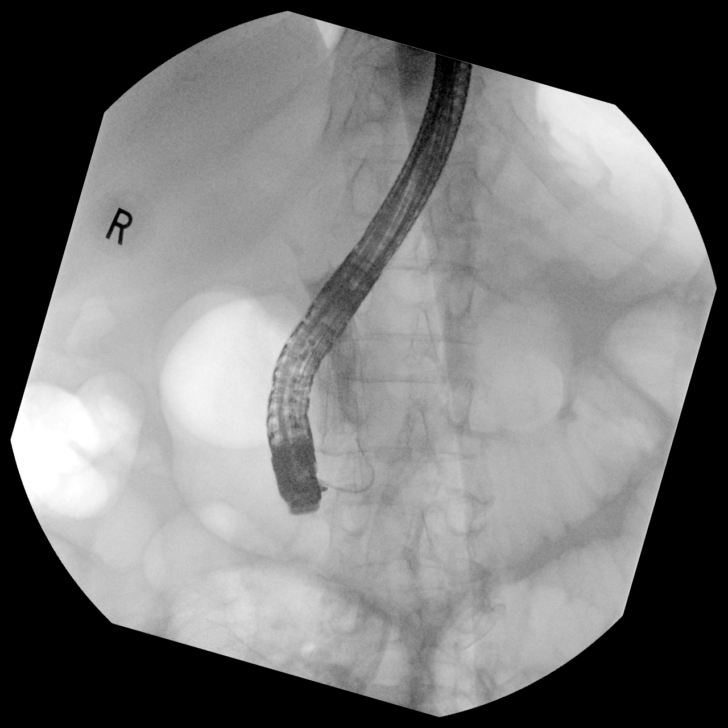
[im 4/8]
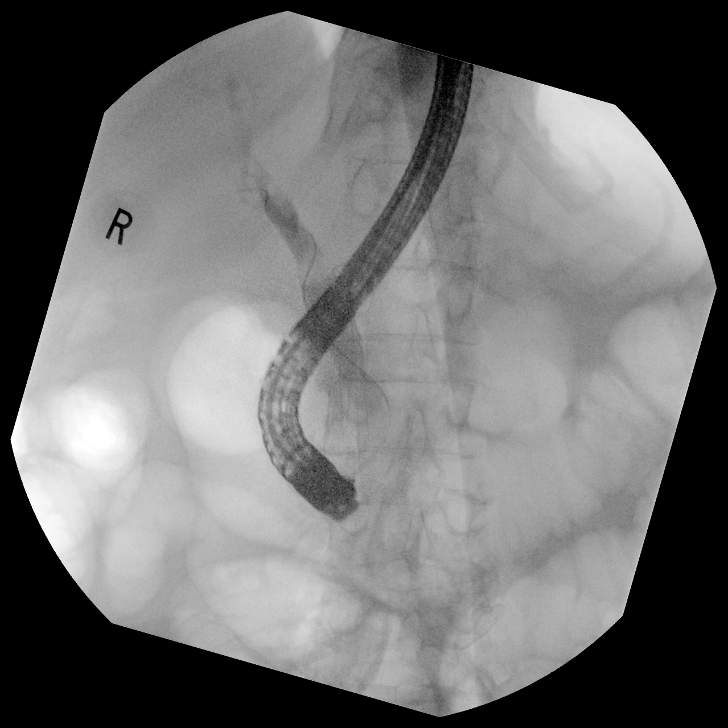
[im 5/8]
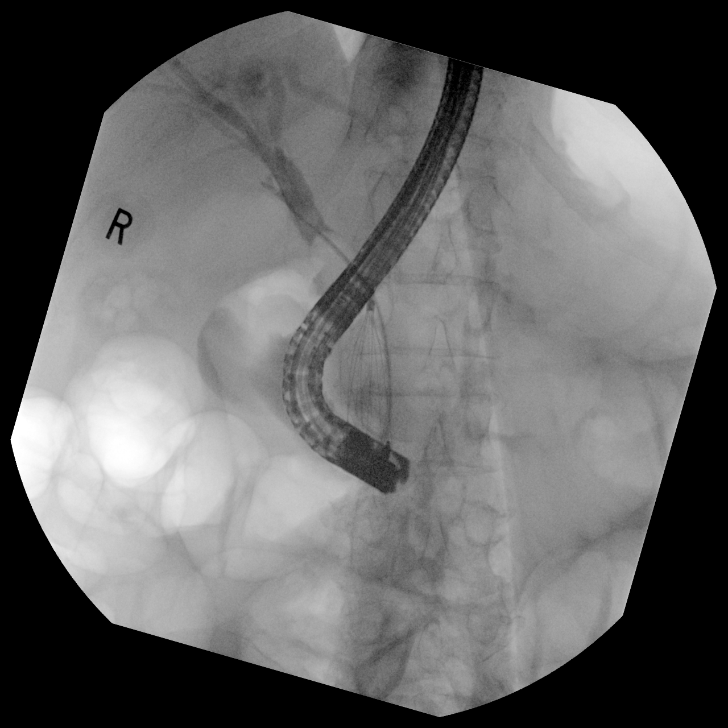
[im 6/8]
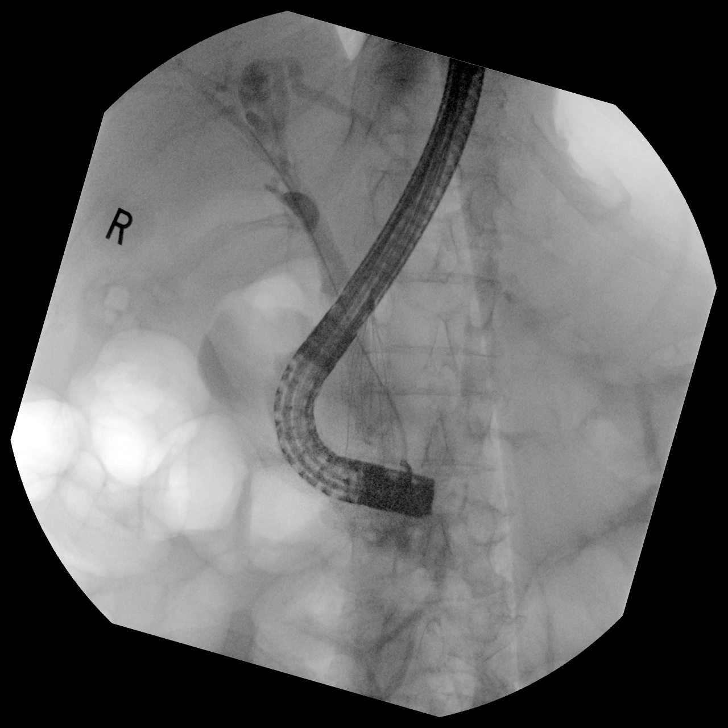
[im 7/8]
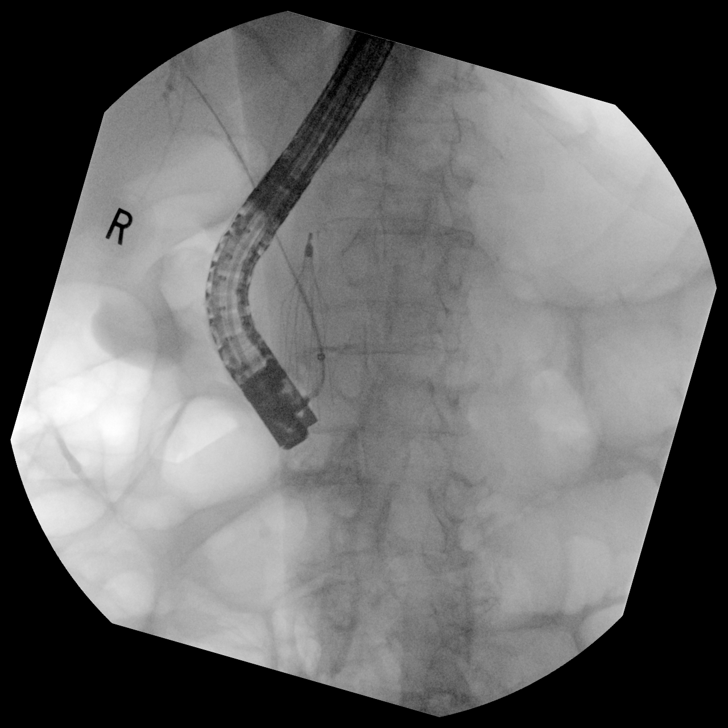
[im 8/8]
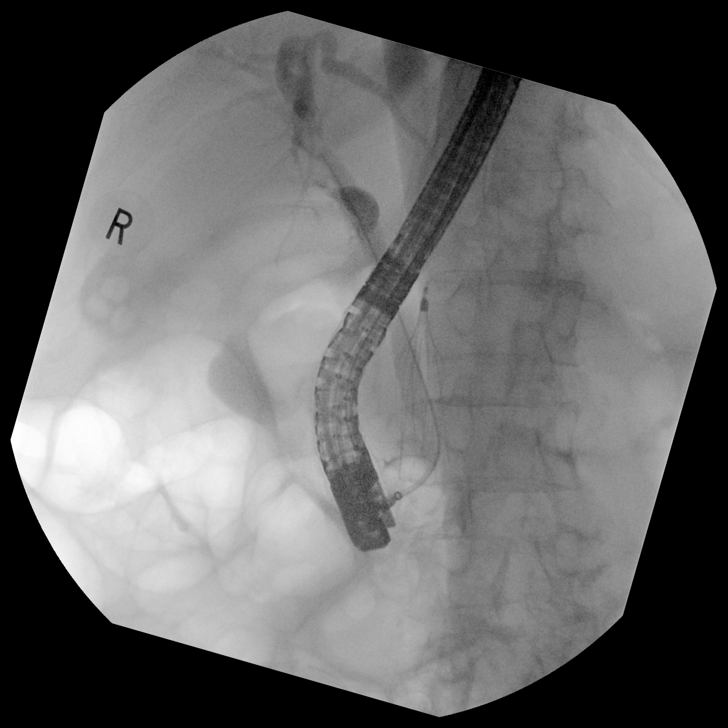

[8 of 8 positions shown; findings below may reference images not displayed]

FINDINGS: Submitted intraoperative fluoroscopic images demonstrate multiple
filling defects in the bile ducts the which may be due to
stones/debris or air. Balloon noted within the mid common bile duct
on 1 of the images.
IMPRESSION: Intraoperative fluoroscopic images of ERCP as above.

These images were submitted for radiologic interpretation only.
Please see the procedural report for the amount of contrast and the
fluoroscopy time utilized.

## 2022-11-05 IMAGING — US US ABDOMEN LIMITED
1 series · 14 of 25 positions shown · non-contrast
Comparison: None.

CLINICAL DATA: Right upper quadrant pain for 2 days.

EXAM:
ULTRASOUND ABDOMEN LIMITED RIGHT UPPER QUADRANT

[Series 1: us abdomen limited ruq (liver/gb) · 14 of 32 slices shown]
[im 1/32]
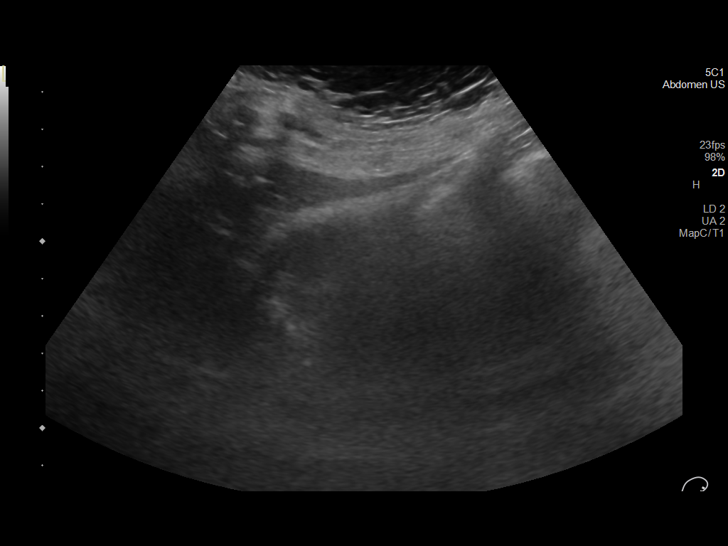
[im 3/32]
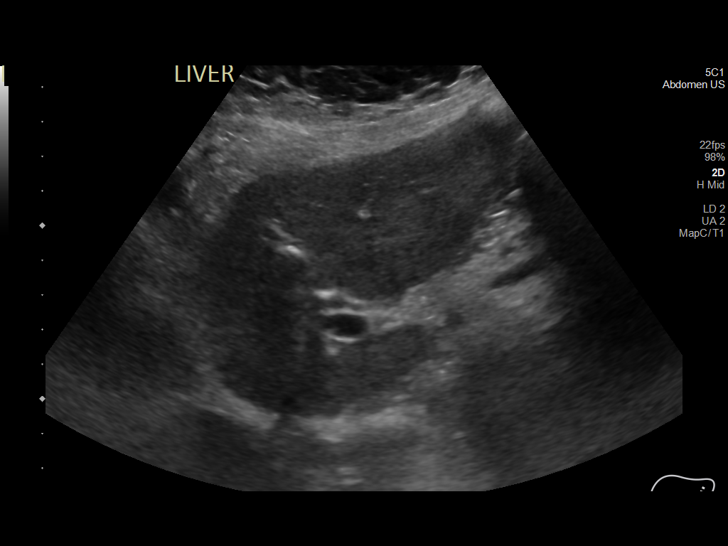
[im 6/32]
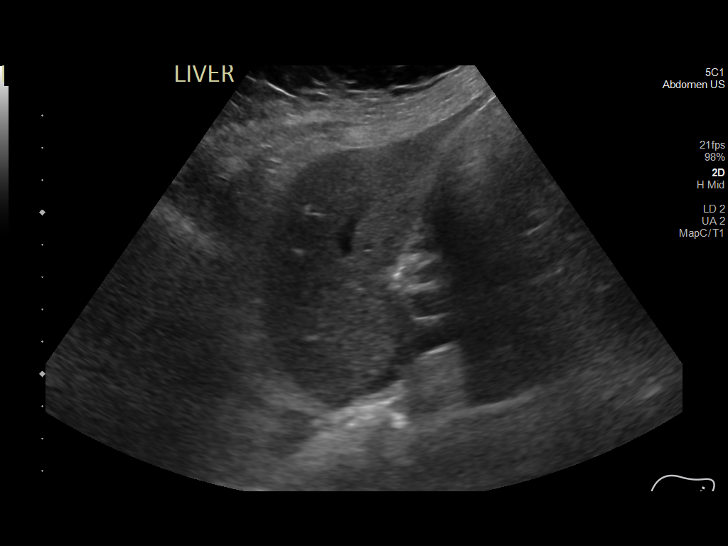
[im 8/32]
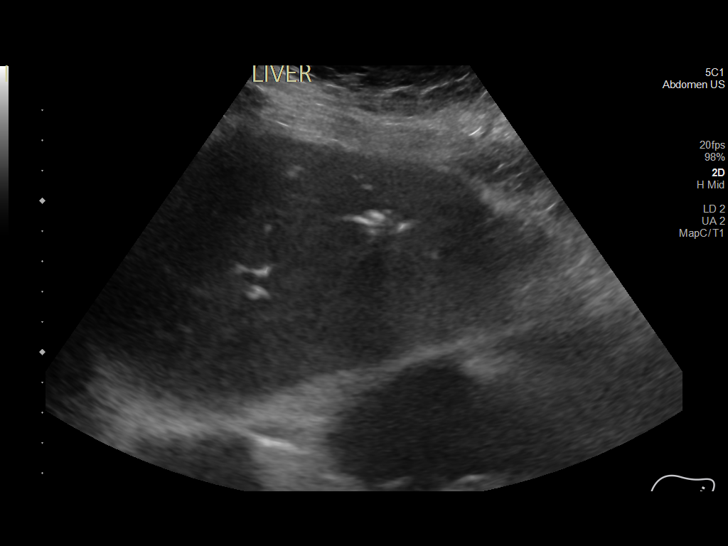
[im 11/32]
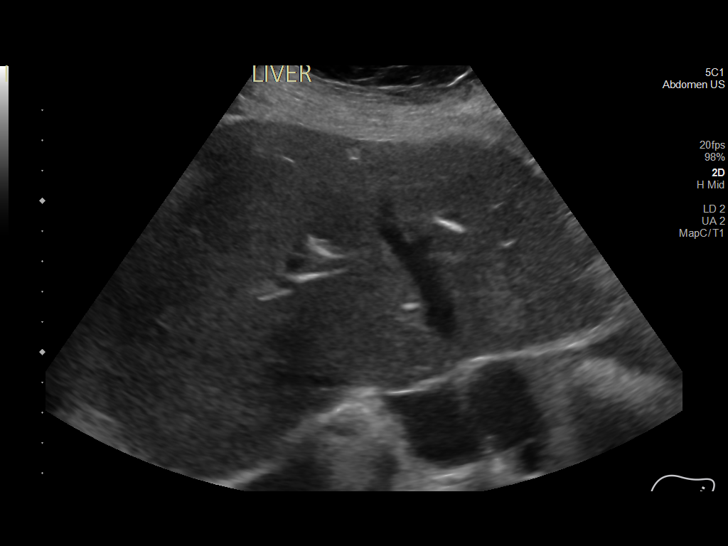
[im 12/32]
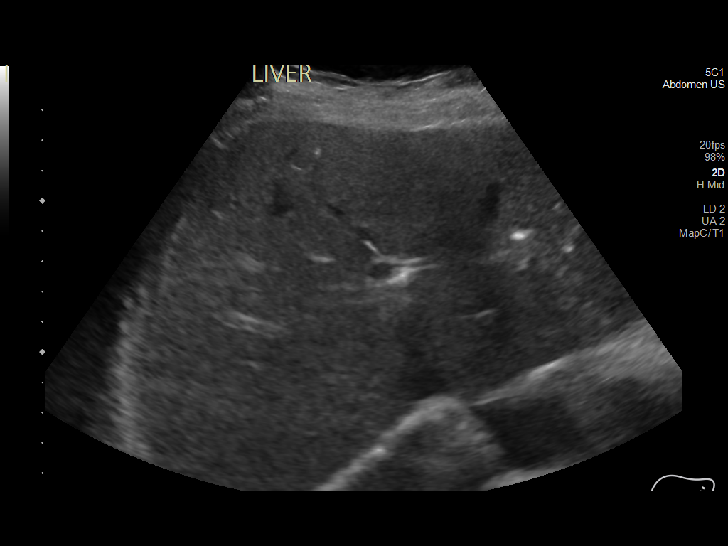
[im 15/32]
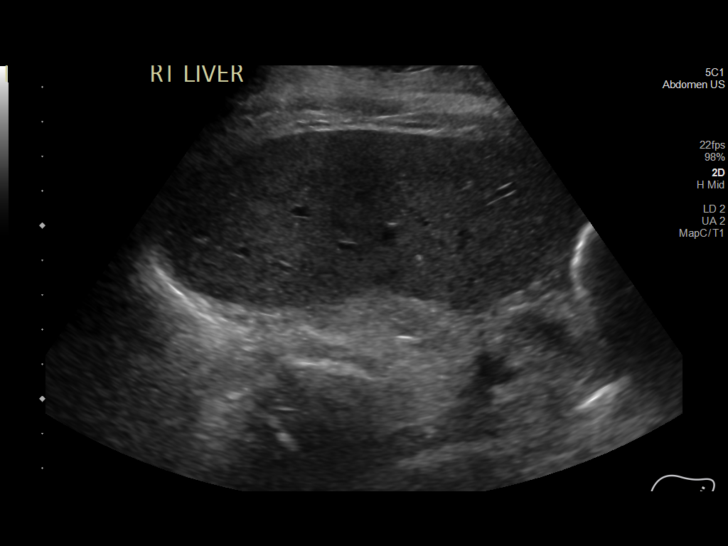
[im 17/32]
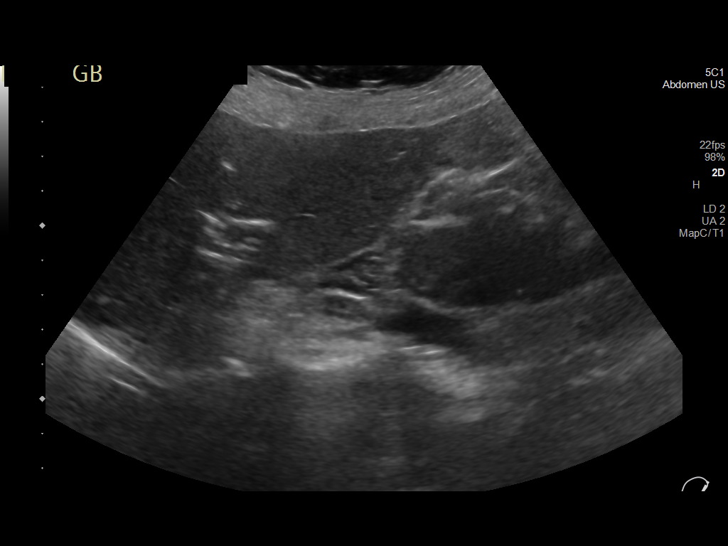
[im 20/32]
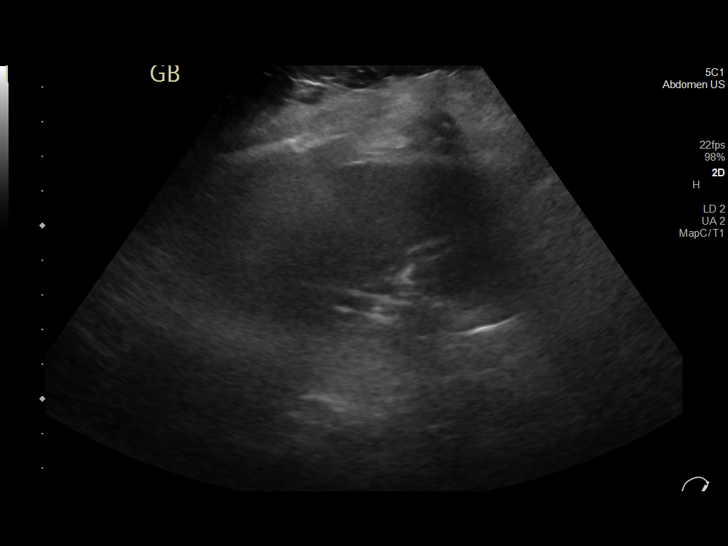
[im 21/32]
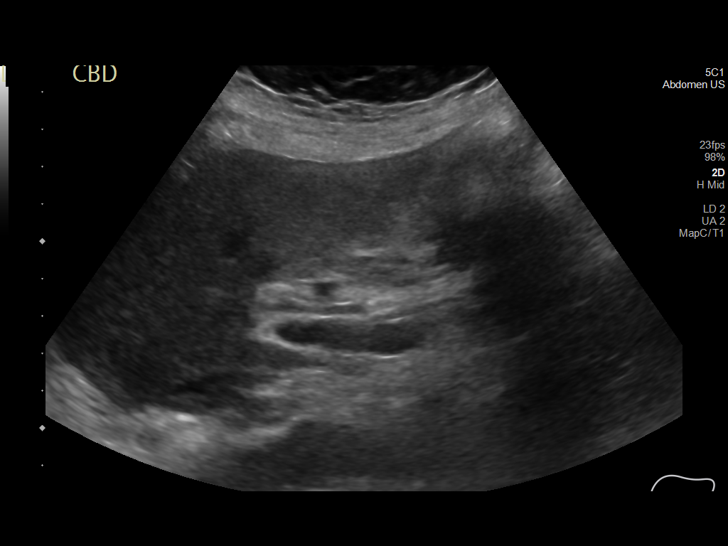
[im 24/32]
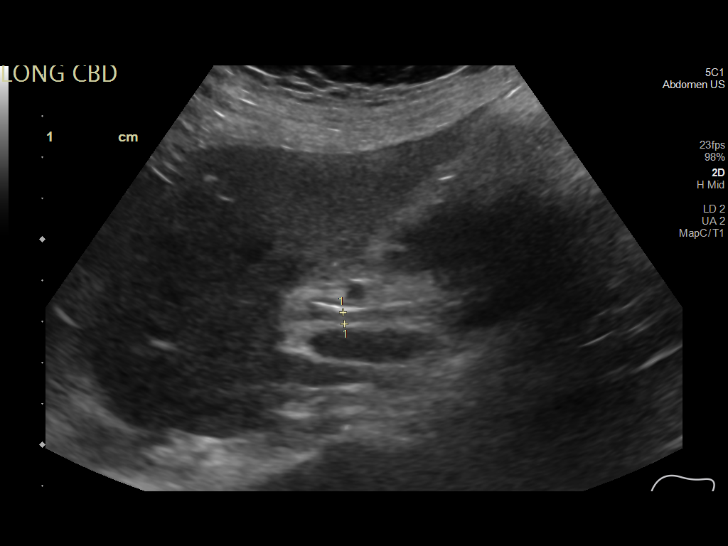
[im 26/32]
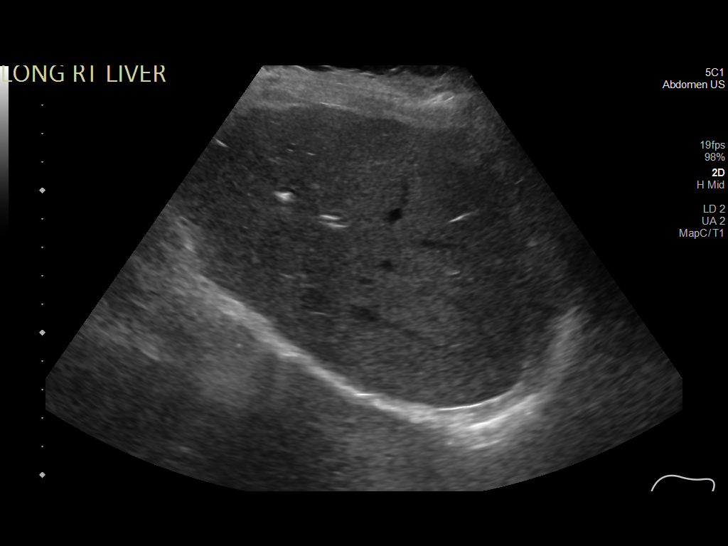
[im 29/32]
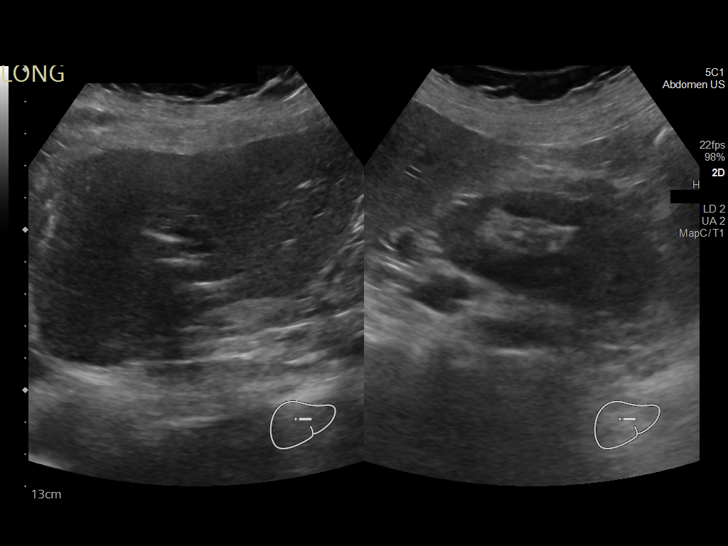
[im 32/32]
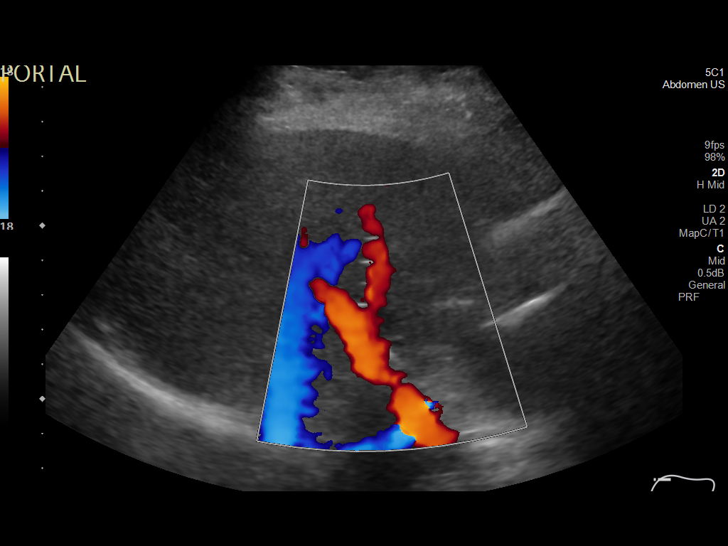

[14 of 25 positions shown; findings below may reference images not displayed]

FINDINGS: Gallbladder:

Not visualized.  Patient 9 in the hours prior to the study.

Common bile duct:

Diameter: 3 mm.

Liver:

No focal lesion identified. Within normal limits in parenchymal
echogenicity. Pneumobilia evident. Patient is status post
sphincterotomy 01/22/2021. Portal vein is patent on color Doppler
imaging with normal direction of blood flow towards the liver.

Other: None.
IMPRESSION: 1. Nonvisualization of the gallbladder. Patient did eat in the hours
prior to the study. Exam could be repeated after in appropriate
period of fasting as clinically warranted.
2. No intra or extrahepatic biliary duct dilatation. Pneumobilia in
the liver is compatible the history of recent sphincterotomy.

## 2023-03-15 ENCOUNTER — Emergency Department (HOSPITAL_BASED_OUTPATIENT_CLINIC_OR_DEPARTMENT_OTHER): Payer: Medicare Other

## 2023-03-15 ENCOUNTER — Emergency Department (HOSPITAL_BASED_OUTPATIENT_CLINIC_OR_DEPARTMENT_OTHER)
Admission: EM | Admit: 2023-03-15 | Discharge: 2023-03-15 | Disposition: A | Discharge status: Home / Self Care | Payer: Medicare Other | Attending: Emergency Medicine | Admitting: Emergency Medicine

## 2023-03-15 ENCOUNTER — Other Ambulatory Visit: Payer: Self-pay

## 2023-03-15 ENCOUNTER — Encounter (HOSPITAL_BASED_OUTPATIENT_CLINIC_OR_DEPARTMENT_OTHER): Payer: Self-pay

## 2023-03-15 DIAGNOSIS — I5032 Chronic diastolic (congestive) heart failure: Secondary | ICD-10-CM | POA: Insufficient documentation

## 2023-03-15 DIAGNOSIS — Z1152 Encounter for screening for COVID-19: Secondary | ICD-10-CM | POA: Diagnosis not present

## 2023-03-15 DIAGNOSIS — E860 Dehydration: Secondary | ICD-10-CM | POA: Insufficient documentation

## 2023-03-15 DIAGNOSIS — Z7901 Long term (current) use of anticoagulants: Secondary | ICD-10-CM | POA: Insufficient documentation

## 2023-03-15 DIAGNOSIS — N179 Acute kidney failure, unspecified: Secondary | ICD-10-CM | POA: Diagnosis not present

## 2023-03-15 DIAGNOSIS — R531 Weakness: Secondary | ICD-10-CM | POA: Diagnosis present

## 2023-03-15 LAB — BASIC METABOLIC PANEL
Anion gap: 10 (ref 5–15)
BUN: 20 mg/dL (ref 8–23)
CO2: 18 mmol/L — ABNORMAL LOW (ref 22–32)
Calcium: 9.1 mg/dL (ref 8.9–10.3)
Chloride: 107 mmol/L (ref 98–111)
Creatinine, Ser: 1.29 mg/dL — ABNORMAL HIGH (ref 0.44–1.00)
GFR, Estimated: 42 mL/min — ABNORMAL LOW (ref >60)
Glucose, Bld: 144 mg/dL — ABNORMAL HIGH (ref 70–99)
Potassium: 4.7 mmol/L (ref 3.5–5.1)
Sodium: 135 mmol/L (ref 135–145)

## 2023-03-15 LAB — CBC
HCT: 40.9 % (ref 36.0–46.0)
Hemoglobin: 12.6 g/dL (ref 12.0–15.0)
MCH: 24.1 pg — ABNORMAL LOW (ref 26.0–34.0)
MCHC: 30.8 g/dL (ref 30.0–36.0)
MCV: 78.2 fL — ABNORMAL LOW (ref 80.0–100.0)
Platelets: 188 10*3/uL (ref 150–400)
RBC: 5.23 MIL/uL — ABNORMAL HIGH (ref 3.87–5.11)
RDW: 20.9 % — ABNORMAL HIGH (ref 11.5–15.5)
WBC: 7.6 10*3/uL (ref 4.0–10.5)
nRBC: 0 % (ref 0.0–0.2)

## 2023-03-15 LAB — URINALYSIS, ROUTINE W REFLEX MICROSCOPIC
Bilirubin Urine: NEGATIVE
Glucose, UA: NEGATIVE mg/dL
Ketones, ur: NEGATIVE mg/dL
Nitrite: NEGATIVE
Protein, ur: NEGATIVE mg/dL
Specific Gravity, Urine: 1.02 (ref 1.005–1.030)
pH: 6 (ref 5.0–8.0)

## 2023-03-15 LAB — PROTIME-INR
INR: 1.5 — ABNORMAL HIGH (ref 0.8–1.2)
Prothrombin Time: 18.3 seconds — ABNORMAL HIGH (ref 11.4–15.2)

## 2023-03-15 LAB — URINALYSIS, MICROSCOPIC (REFLEX)

## 2023-03-15 LAB — HEPATIC FUNCTION PANEL
ALT: 17 U/L (ref 0–44)
AST: 25 U/L (ref 15–41)
Albumin: 3 g/dL — ABNORMAL LOW (ref 3.5–5.0)
Alkaline Phosphatase: 74 U/L (ref 38–126)
Bilirubin, Direct: 0.2 mg/dL (ref 0.0–0.2)
Indirect Bilirubin: 0.8 mg/dL (ref 0.3–0.9)
Total Bilirubin: 1 mg/dL (ref 0.3–1.2)
Total Protein: 6.7 g/dL (ref 6.5–8.1)

## 2023-03-15 LAB — TROPONIN I (HIGH SENSITIVITY)
Troponin I (High Sensitivity): 8 ng/L (ref <18)
Troponin I (High Sensitivity): 8 ng/L (ref <18)

## 2023-03-15 LAB — SARS CORONAVIRUS 2 BY RT PCR: SARS Coronavirus 2 by RT PCR: NEGATIVE

## 2023-03-15 LAB — BRAIN NATRIURETIC PEPTIDE: B Natriuretic Peptide: 334 pg/mL — ABNORMAL HIGH (ref 0.0–100.0)

## 2023-03-15 MED ORDER — SODIUM CHLORIDE 0.9 % IV SOLN: Freq: Once | INTRAVENOUS | Status: AC

## 2023-03-15 MED ORDER — SODIUM CHLORIDE 0.9 % IV BOLUS
500.0000 mL | Freq: Once | INTRAVENOUS | Status: AC
  Administered 2023-03-15: 500 mL via INTRAVENOUS

## 2023-03-15 NOTE — ED Notes (Signed)
Per EDP Pfeiffer, pt to receive total of 500 mL NaCl, to include volume already infused at infusion rate of 125 mL/hr.

## 2023-03-15 NOTE — ED Notes (Signed)
Orthostatic vitals completed per order. Pt reports no dizziness upon changing positions. Pt blood pressure dropped upon sitting, RN called into room.

## 2023-03-15 NOTE — ED Notes (Signed)
Assisted pt to bedside commode. Took blood pressure upon sitting on bedside commode  and was 84/75 and HR went up to 140s. RN notified. Pt back in bed, call bell in reach.

## 2023-03-15 NOTE — Discharge Instructions (Signed)
1.  Call your doctor tomorrow to schedule follow-up within the next couple of days.  You will need close monitoring. 2.  It appears that with daily Lasix, you are getting mildly dehydrated.  You will need to work closely with your doctor to determine when you need extra doses and when you need to hold your Lasix temporarily.  At this time, I would suggest holding it tomorrow and then taking a dose on Saturday.  Try to see your doctor for recheck by Monday. 3.  Return to the emergency department immediately if you have new worsening or concerning symptoms.

## 2023-03-15 NOTE — ED Provider Notes (Signed)
[place] [facility] Provider Note   CSN: [id number] Arrival date & time: 03/15/23  1450     History  Chief Complaint  Patient presents with   Weakness    Jill Crane is a 82 y.o. female.  HPI Past medical history includes chronic atrial fibrillation anticoagulated, chronic diastolic congestive heart failure.  Cholecystectomy done September 2023 for acute on chronic cholecystitis.  Stenting of right common iliac and right external iliac artery 1\2024.  Patient reports that she has had fatigue for about 4 to 5 days.  Really no other specific symptoms.  Patient reports that typically she has pretty good energy.  Now, even minor activities and she is extremely fatigued and needs to lie back down again.  She also reports when she gets up to walk around, she is often getting fairly dizzy as well.  She denies any passing out episodes.  She denies she is felt like she is close to passing out.  She has not had chest pain or shortness of breath.  No lower extremity swelling.  She does report that her Lasix dosing was increased due to lower extremity swelling which subsequently resolved.  She reports that she used to take Lasix as needed if she got some swelling around her ankles and feet.  Both of her ankles and feet were swollen about a month ago and her doctor had her start taking Lasix daily.  She takes 20 mg a day.  She reports otherwise there have been no changes.  She is taking half a dose of 25 mg metoprolol extended release.  AKI it appears that she is taking 12.5 mg for her daughter's description.  The medication list shows 50 mg daily and a prescribed 25 mg tablet which her daughter reports she breaks in half for her.  The patient has not had any fevers or chills.  No nausea vomiting or diarrhea.  No pain burning or urgency with urination.  She reports aside from significant fatigue with any activity and lightheadedness\dizziness when she stands and moves  around, she feels well.    Home Medications Prior to Admission medications   Medication Sig Start Date End Date Taking? Authorizing Provider  acetaminophen (TYLENOL) 500 MG tablet Take 500 mg by mouth every 6 (six) hours as needed for mild pain.    [provider]  albuterol (VENTOLIN HFA) 108 (90 Base) MCG/ACT inhaler Inhale 2 puffs into the lungs every 6 (six) hours as needed for wheezing or shortness of breath. 02/02/22   Almon Hercules, MD  alendronate (FOSAMAX) 35 MG tablet Take 35 mg by mouth every Monday. Take with a full glass of water on an empty stomach.    [provider]  bimatoprost (LUMIGAN) 0.01 % SOLN Place 1 drop into both eyes at bedtime.    [provider]  brimonidine (ALPHAGAN) 0.2 % ophthalmic solution Place 1 drop into the left eye in the morning and at bedtime. 10/21/19   [provider]  busPIRone (BUSPAR) 10 MG tablet Take 10 mg by mouth 2 (two) times daily. 08/04/20   [provider]  calcium-vitamin D (OSCAL WITH D) 500-200 MG-UNIT per tablet Take 1 tablet by mouth daily.    [provider]  dorzolamide (TRUSOPT) 2 % ophthalmic solution Place 1 drop into the left eye 2 (two) times daily. 08/03/20   [provider]  fluocinonide ointment (LIDEX) 0.05 % Apply 1 Application topically 2 (two) times daily as needed (scalp  irritation). 12/27/21   [provider]  fluticasone (FLONASE) 50 MCG/ACT nasal spray Place 2 sprays into both nostrils daily as needed for allergies. 09/27/21   [provider]  furosemide (LASIX) 20 MG tablet Take 1 tablet (20 mg total) by mouth daily. Patient taking differently: Take 20 mg by mouth daily as needed (swelling). 01/28/21   Hollice Espy, MD  gabapentin (NEURONTIN) 300 MG capsule Take 300 mg by mouth at bedtime. 08/10/20   [provider]  magnesium oxide (MAG-OX) 400 (241.3 Mg) MG tablet Take 400 mg by mouth in the morning and at bedtime. 07/08/20    [provider]  metFORMIN (GLUCOPHAGE) 500 MG tablet Take 500 mg by mouth 2 (two) times daily. 08/24/20   [provider]  metoprolol succinate (TOPROL-XL) 25 MG 24 hr tablet Take 25 mg by mouth daily.    [provider]  omeprazole (PRILOSEC) 20 MG capsule Take 20 mg by mouth daily.    [provider]  predniSONE (DELTASONE) 2.5 MG tablet Take 7.5 mg by mouth daily.    [provider]  rosuvastatin (CRESTOR) 10 MG tablet Take 1 tablet (10 mg total) by mouth daily. 07/18/22 07/18/23  Nada Libman, MD  silver sulfADIAZINE (SILVADENE) 1 % cream Apply 1 Application topically daily as needed (scalp irritation).    [provider]  warfarin (COUMADIN) 2 MG tablet On Saturday and Sunday, take 2 tabs (4mg ), then resume normal regimen Take 1 tablet (2 mg) daily except Take 2 tablets (4 mg) on (Tues & Fri) Patient taking differently: Take 2-4 mg by mouth daily. Takes 2 mg (1 tab) daily except 4 mg (2 tab) on Mon and Fri 01/28/21   Hollice Espy, MD      Allergies    Patient has no known allergies.    Review of Systems   Review of Systems  Physical Exam Updated Vital Signs BP 103/81   Pulse 99   Temp 98.5 F (36.9 C)   Resp 20   Wt 70.3 kg   SpO2 99%   BMI 25.02 kg/m  Physical Exam Constitutional:      Comments: Nontoxic clinically well appearance.  No respiratory distress at rest.  HENT:     Mouth/Throat:     Mouth: Mucous membranes are moist.     Pharynx: Oropharynx is clear.  Eyes:     Extraocular Movements: Extraocular movements intact.  Cardiovascular:     Comments: Irregularly irregular.  Monitor shows heart rate variable from 60s up to low 100s atrial fibrillation narrow complex. Pulmonary:     Effort: Pulmonary effort is normal.     Breath sounds: Normal breath sounds.  Abdominal:     General: There is no distension.     Palpations: Abdomen is soft.     Tenderness: There is no abdominal tenderness. There is no  guarding.  Musculoskeletal:        General: No swelling or tenderness. Normal range of motion.     Right lower leg: No edema.     Left lower leg: No edema.  Skin:    General: Skin is warm and dry.  Neurological:     General: No focal deficit present.     Mental Status: She is oriented to person, place, and time.     Coordination: Coordination normal.  Psychiatric:        Mood and Affect: Mood normal.     ED Results / Procedures / Treatments   Labs (all  labs ordered are listed, but only abnormal results are displayed) Labs Reviewed  BASIC METABOLIC PANEL - Abnormal; Notable for the following components:      Result Value   CO2 18 (*)    Glucose, Bld 144 (*)    Creatinine, Ser 1.29 (*)    GFR, Estimated 42 (*)    All other components within normal limits  URINALYSIS, ROUTINE W REFLEX MICROSCOPIC - Abnormal; Notable for the following components:   Hgb urine dipstick TRACE (*)    Leukocytes,Ua TRACE (*)    All other components within normal limits  BRAIN NATRIURETIC PEPTIDE - Abnormal; Notable for the following components:   B Natriuretic Peptide 334.0 (*)    All other components within normal limits  CBC - Abnormal; Notable for the following components:   RBC 5.23 (*)    MCV 78.2 (*)    MCH 24.1 (*)    RDW 20.9 (*)    All other components within normal limits  HEPATIC FUNCTION PANEL - Abnormal; Notable for the following components:   Albumin 3.0 (*)    All other components within normal limits  PROTIME-INR - Abnormal; Notable for the following components:   Prothrombin Time 18.3 (*)    INR 1.5 (*)    All other components within normal limits  URINALYSIS, MICROSCOPIC (REFLEX) - Abnormal; Notable for the following components:   Bacteria, UA RARE (*)    All other components within normal limits  SARS CORONAVIRUS 2 BY RT PCR  TROPONIN I (HIGH SENSITIVITY)  TROPONIN I (HIGH SENSITIVITY)    EKG EKG Interpretation Date/Time:  Thursday March 15 2023 15:45:11  EDT Ventricular Rate:  97 PR Interval:    QRS Duration:  86 QT Interval:  349 QTC Calculation: 420 R Axis:   -4  Text Interpretation: Atrial fibrillation Probable anteroseptal infarct, old Nonspecific T abnormalities, lateral leads no sig change from previous Confirmed by Arby Barrette [phone]) on 03/15/2023 4:32:09 PM  Radiology DG Chest 2 View  Result Date: 03/15/2023 CLINICAL DATA:  Malaise. EXAM: CHEST - 2 VIEW COMPARISON:  February 14, 2023 FINDINGS: There is stable single lead ventricular pacer lead positioning. The cardiac silhouette is mildly enlarged and unchanged in size. Marked severity tortuosity of the descending thoracic aorta is noted. There is no evidence of an acute infiltrate, pleural effusion or pneumothorax. An inferior vena cava filter is seen within the visualized portion of the abdomen. 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 IMPRESSION: Mild cardiomegaly without acute or active cardiopulmonary disease. Electronically Signed   By: Aram Candela M.D.   On: 03/15/2023 16:25    Procedures Procedures    Medications Ordered in ED Medications  0.9 %  sodium chloride infusion (0 mLs Intravenous Stopped 03/15/23 1913)  sodium chloride 0.9 % bolus 500 mL ( Intravenous Restarted 03/15/23 1949)    ED Course/ Medical Decision Making/ A&P                                 Medical Decision Making Amount and/or Complexity of Data Reviewed Labs: ordered. Radiology: ordered.  Risk Prescription drug management.   Patient is a systolic.  She has been experiencing nonspecific fatigue and dizziness with exertion.  Differential diagnosis is broad.  Patient has known history of atrial fibrillation anticoagulated with warfarin.  Possible overdiuresis secondary to increased recent use of Lasix\possible infectious etiology which is a UTI or pneumonia.  Possible  CHF exacerbation\ACS\uncontrolled atrial fibrillation.  Will proceed with broad  diagnostic evaluation and continue to monitor closely.  Metabolic panel shows sodium 135 bicarb 18 glucose 1 BUN 20 creatinine 1 GFR 42.  GFR is down from baseline GFR of 64 documented 7\31\2024.  With AKI which is secondary to recent increase Lasix use, will opt to lightly rehydrate.  Patient was positively orthostatic with supine the sitting but then rebounded in standing position.  Patient did not apparently feel terribly symptomatic with this maneuver.  Will reassess after light hydration.  After 500 cc normal saline, patient's orthostatic vital signs are improved.  She feels well.  I have reviewed a follow-up plan with the patient and her daughter.  They are in agreement.  We reviewed return precautions.  At this time they will use Lasix on a more as-needed basis holding dose tomorrow and then taking a dose on Saturday.  She is advised to return immediately if she is feeling short of breath, or worsening symptoms.        Final Clinical Impression(s) / ED Diagnoses Final diagnoses:  Dehydration  AKI (acute kidney injury) (HCC)  General weakness    Rx / DC Orders ED Discharge Orders     None         Arby Barrette, MD 03/15/23 2054

## 2023-03-15 NOTE — ED Triage Notes (Signed)
Pt reports feeling tired and weak the last 4-5 days. Reports some shortness of breath.

## 2023-04-03 ENCOUNTER — Ambulatory Visit (HOSPITAL_COMMUNITY)
Admission: RE | Admit: 2023-04-03 | Discharge: 2023-04-03 | Disposition: A | Discharge status: Home / Self Care | Payer: Medicare Other | Source: Ambulatory Visit | Attending: Vascular Surgery | Admitting: Vascular Surgery

## 2023-04-03 ENCOUNTER — Ambulatory Visit (INDEPENDENT_AMBULATORY_CARE_PROVIDER_SITE_OTHER)
Admission: RE | Admit: 2023-04-03 | Discharge: 2023-04-03 | Disposition: A | Discharge status: Home / Self Care | Payer: Medicare Other | Source: Ambulatory Visit | Attending: Vascular Surgery | Admitting: Vascular Surgery

## 2023-04-03 ENCOUNTER — Ambulatory Visit (INDEPENDENT_AMBULATORY_CARE_PROVIDER_SITE_OTHER): Payer: Medicare Other | Admitting: Physician Assistant

## 2023-04-03 ENCOUNTER — Encounter (HOSPITAL_COMMUNITY): Payer: Self-pay

## 2023-04-03 VITALS — BP 82/53 | HR 62 | Temp 97.2°F | Resp 18 | Ht 66.0 in | Wt 155.8 lb

## 2023-04-03 DIAGNOSIS — I7409 Other arterial embolism and thrombosis of abdominal aorta: Secondary | ICD-10-CM

## 2023-04-03 DIAGNOSIS — I70229 Atherosclerosis of native arteries of extremities with rest pain, unspecified extremity: Secondary | ICD-10-CM

## 2023-04-03 LAB — VAS US ABI WITH/WO TBI
Left ABI: 1.08
Right ABI: 0.94

## 2023-04-03 NOTE — Progress Notes (Signed)
Office Note     CC:  follow up Requesting Provider:  Zoila Shutter, MD  HPI: Jill Crane is a 82 y.o. (12-19-1940) female who presents for surveillance of PAD.  She underwent aortogram with left common and external iliac artery stenting by Dr. Myra Gianotti on 07/18/2022 due to claudication symptoms.  She also has known bilateral SFA occlusions.  Claudication symptoms resolved after her procedure.  She reports no return in symptoms.  She also denies any rest pain or tissue loss of bilateral lower extremity.  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  She is on Coumadin for atrial fibrillation.  She has not been taking her aspirin.  She is on a daily statin.  She denies tobacco use.   Past Medical History:  Diagnosis Date   Atrial fibrillation with normal ventricular rate (HCC)    CHF (congestive heart failure) (HCC)    COPD (chronic obstructive pulmonary disease) (HCC)    Diabetes mellitus without complication (HCC)    Hx of blood clots    Hypertension    Pacemaker    Sepsis (HCC) 11/10/2019   SIRS (systemic inflammatory response syndrome) (HCC) 08/15/2020    Past Surgical History:  Procedure Laterality Date   ABDOMINAL AORTOGRAM W/LOWER EXTREMITY N/A 07/18/2022   Procedure: ABDOMINAL AORTOGRAM W/ Bilateral LOWER EXTREMITY [place];  Surgeon: Nada Libman, MD;  Location: [facility];  Service: Cardiovascular;  Laterality: N/A;   CHOLECYSTECTOMY N/A 04/12/2022   Procedure: LAPAROSCOPIC CHOLECYSTECTOMY WITH ICG;  Surgeon: Romie Levee, MD;  Location: [facility];  Service: General;  Laterality: N/A;   ENDOSCOPIC RETROGRADE CHOLANGIOPANCREATOGRAPHY (ERCP) WITH PROPOFOL N/A 01/22/2021   Procedure: ENDOSCOPIC RETROGRADE CHOLANGIOPANCREATOGRAPHY (ERCP) WITH PROPOFOL;  Surgeon: Hilarie Fredrickson, MD;  Location: [facility] ENDOSCOPY;  Service: Endoscopy;  Laterality: N/A;   ESOPHAGOGASTRODUODENOSCOPY (EGD) WITH PROPOFOL N/A 01/28/2021   Procedure:  ESOPHAGOGASTRODUODENOSCOPY (EGD) WITH PROPOFOL;  Surgeon: Iva Boop, MD;  Location: [facility] ENDOSCOPY;  Service: Endoscopy;  Laterality: N/A;   IR PERC CHOLECYSTOSTOMY  01/30/2022   IR PERC CHOLECYSTOSTOMY  03/28/2022   PERIPHERAL VASCULAR INTERVENTION Left 07/18/2022   Procedure: PERIPHERAL VASCULAR INTERVENTION;  Surgeon: Nada Libman, MD;  Location: [facility];  Service: Cardiovascular;  Laterality: Left;  left common iliac artery and left external iliac artery   REMOVAL OF STONES  01/22/2021   Procedure: REMOVAL OF STONES;  Surgeon: Hilarie Fredrickson, MD;  Location: [facility] ENDOSCOPY;  Service: Endoscopy;;   SPHINCTEROTOMY  01/22/2021   Procedure: Dennison Mascot;  Surgeon: Hilarie Fredrickson, MD;  Location: [facility] ENDOSCOPY;  Service: Endoscopy;;    Social History   Socioeconomic History   Marital status: Widowed    Spouse name: Not on file   Number of children: Not on file   Years of education: Not on file   Highest education level: Not on file  Occupational History   Not on file  Tobacco Use   Smoking status: Former    Types: Cigarettes   Smokeless tobacco: Never  Vaping Use   Vaping status: Never Used  Substance and Sexual Activity   Alcohol use: No   Drug use: Never   Sexual activity: Not on file  Other Topics Concern   Not on file  Social History Narrative   Not on file   Social Determinants of Health   Financial Resource Strain: Not on file  Food Insecurity: Unknown (09/18/2022)   [facility]  Hunger Vital Sign    Worried About Running Out of Food in the Last Year: Patient declined to answer    Ran Out of Food in the Last Year: Patient declined to answer  Transportation Needs: Not on file (09/18/2022)  Physical Activity: Not on file  Stress: Not on file  Social Connections: Not on file  Intimate Partner Violence: Not At Risk (04/08/2022)   Humiliation, Afraid, Rape, and Kick questionnaire    Fear of Current or Ex-Partner: No     Emotionally Abused: No    Physically Abused: No    Sexually Abused: No    Family History  Problem Relation Age of Onset   Stroke Mother    Diabetes Mother    Stroke Father     Current Outpatient Medications  Medication Sig Dispense Refill   acetaminophen (TYLENOL) 500 MG tablet Take 500 mg by mouth every 6 (six) hours as needed for mild pain.     albuterol (VENTOLIN HFA) 108 (90 Base) MCG/ACT inhaler Inhale 2 puffs into the lungs every 6 (six) hours as needed for wheezing or shortness of breath.     alendronate (FOSAMAX) 35 MG tablet Take 35 mg by mouth every Monday. Take with a full glass of water on an empty stomach.     bimatoprost (LUMIGAN) 0.01 % SOLN Place 1 drop into both eyes at bedtime.     brimonidine (ALPHAGAN) 0.2 % ophthalmic solution Place 1 drop into the left eye in the morning and at bedtime.     busPIRone (BUSPAR) 10 MG tablet Take 10 mg by mouth 2 (two) times daily.     calcium-vitamin D (OSCAL WITH D) 500-200 MG-UNIT per tablet Take 1 tablet by mouth daily.     dorzolamide (TRUSOPT) 2 % ophthalmic solution Place 1 drop into the left eye 2 (two) times daily.     fluocinonide ointment (LIDEX) 0.05 % Apply 1 Application topically 2 (two) times daily as needed (scalp irritation).     fluticasone (FLONASE) 50 MCG/ACT nasal spray Place 2 sprays into both nostrils daily as needed for allergies.     furosemide (LASIX) 20 MG tablet Take 1 tablet (20 mg total) by mouth daily. (Patient taking differently: Take 20 mg by mouth daily as needed (swelling).)     gabapentin (NEURONTIN) 300 MG capsule Take 300 mg by mouth at bedtime.     magnesium oxide (MAG-OX) 400 (241.3 Mg) MG tablet Take 400 mg by mouth in the morning and at bedtime.     metFORMIN (GLUCOPHAGE) 500 MG tablet Take 500 mg by mouth 2 (two) times daily.     metoprolol succinate (TOPROL-XL) 25 MG 24 hr tablet Take 25 mg by mouth daily.     omeprazole (PRILOSEC) 20 MG capsule Take 20 mg by mouth daily.     predniSONE  (DELTASONE) 2.5 MG tablet Take 7.5 mg by mouth daily.     rosuvastatin (CRESTOR) 10 MG tablet Take 1 tablet (10 mg total) by mouth daily. 30 tablet 11   silver sulfADIAZINE (SILVADENE) 1 % cream Apply 1 Application topically daily as needed (scalp irritation).     warfarin (COUMADIN) 2 MG tablet On Saturday and Sunday, take 2 tabs (4mg ), then resume normal regimen Take 1 tablet (2 mg) daily except Take 2 tablets (4 mg) on (Tues & Fri) (Patient taking differently: Take 2-4 mg by mouth daily. Takes 2 mg (1 tab) daily except 3mg  Wednesdays) 30 tablet 1   No current facility-administered medications for this visit.  No Known Allergies   REVIEW OF SYSTEMS:   [X]  denotes positive finding, [ ]  denotes negative finding Cardiac  Comments:  Chest pain or chest pressure:    Shortness of breath upon exertion:    Short of breath when lying flat:    Irregular heart rhythm:        Vascular    Pain in calf, thigh, or hip brought on by ambulation:    Pain in feet at night that wakes you up from your sleep:     Blood clot in your veins:    Leg swelling:         Pulmonary    Oxygen at home:    Productive cough:     Wheezing:         Neurologic    Sudden weakness in arms or legs:     Sudden numbness in arms or legs:     Sudden onset of difficulty speaking or slurred speech:    Temporary loss of vision in one eye:     Problems with dizziness:         Gastrointestinal    Blood in stool:     Vomited blood:         Genitourinary    Burning when urinating:     Blood in urine:        Psychiatric    Major depression:         Hematologic    Bleeding problems:    Problems with blood clotting too easily:        Skin    Rashes or ulcers:        Constitutional    Fever or chills:      PHYSICAL EXAMINATION:  Vitals:   04/03/23 1004  BP: (!) 82/53  Pulse: 62  Resp: 18  Temp: (!) 97.2 F (36.2 C)  TempSrc: Temporal  SpO2: 92%  Weight: 155 lb 12.8 oz (70.7 kg)  Height: 5\' 6"   (1.676 m)    General:  WDWN in NAD; vital signs documented above Gait: Not observed HENT: WNL, normocephalic Pulmonary: normal non-labored breathing , without Rales, rhonchi,  wheezing Cardiac: regular HR Abdomen: soft, NT, no masses Skin: without rashes Vascular Exam/Pulses: absent pedal pulses Extremities: without ischemic changes, without Gangrene , without cellulitis; without open wounds;  Musculoskeletal: no muscle wasting or atrophy  Neurologic: A&O X 3 Psychiatric:  The pt has Normal affect.   Non-Invasive Vascular Imaging:   Aortoiliac duplex not performed as patient was late for today's visit  ABI/TBIToday's ABIToday's TBIPrevious ABIPrevious TBI  +-------+-----------+-----------+------------+------------+  Right 0.94       0.68       0.84        0.44          +-------+-----------+-----------+------------+------------+  Left  1.08       0.89       0.92        0.49          +-------+-----------+-----------+------------+------------     ASSESSMENT/PLAN:: 82 y.o. female here for surveillance of PAD   Jill Crane is an 82 year old female who underwent left common and external iliac artery stenting by Dr. Myra Gianotti in January due to claudication.  Claudication symptoms resolved after her procedure and they have not returned.  Aortoiliac duplex was not performed today as the patient was late for today's visit.  ABIs however are stable.  We will check an aortoiliac duplex and ABI in 6 months.  She will continue  her statin daily.  I have asked her to restart her daily baby aspirin.  She will return office sooner if her claudication symptoms return or if she develops rest pain or tissue loss.   Emilie Rutter, PA-C [facility] [phone]  Clinic MD:   [facility]

## 2023-04-06 ENCOUNTER — Other Ambulatory Visit: Payer: Self-pay

## 2023-04-06 DIAGNOSIS — I70229 Atherosclerosis of native arteries of extremities with rest pain, unspecified extremity: Secondary | ICD-10-CM

## 2023-04-06 DIAGNOSIS — I7409 Other arterial embolism and thrombosis of abdominal aorta: Secondary | ICD-10-CM

## 2023-04-22 IMAGING — DX DG CHEST 1V PORT
1 series · 1 of 1 positions shown · non-contrast
Comparison: Chest x-ray dated January 27, 2021.

CLINICAL DATA: Fatigue for 2 weeks.

EXAM:
PORTABLE CHEST 1 VIEW

[chest ap]
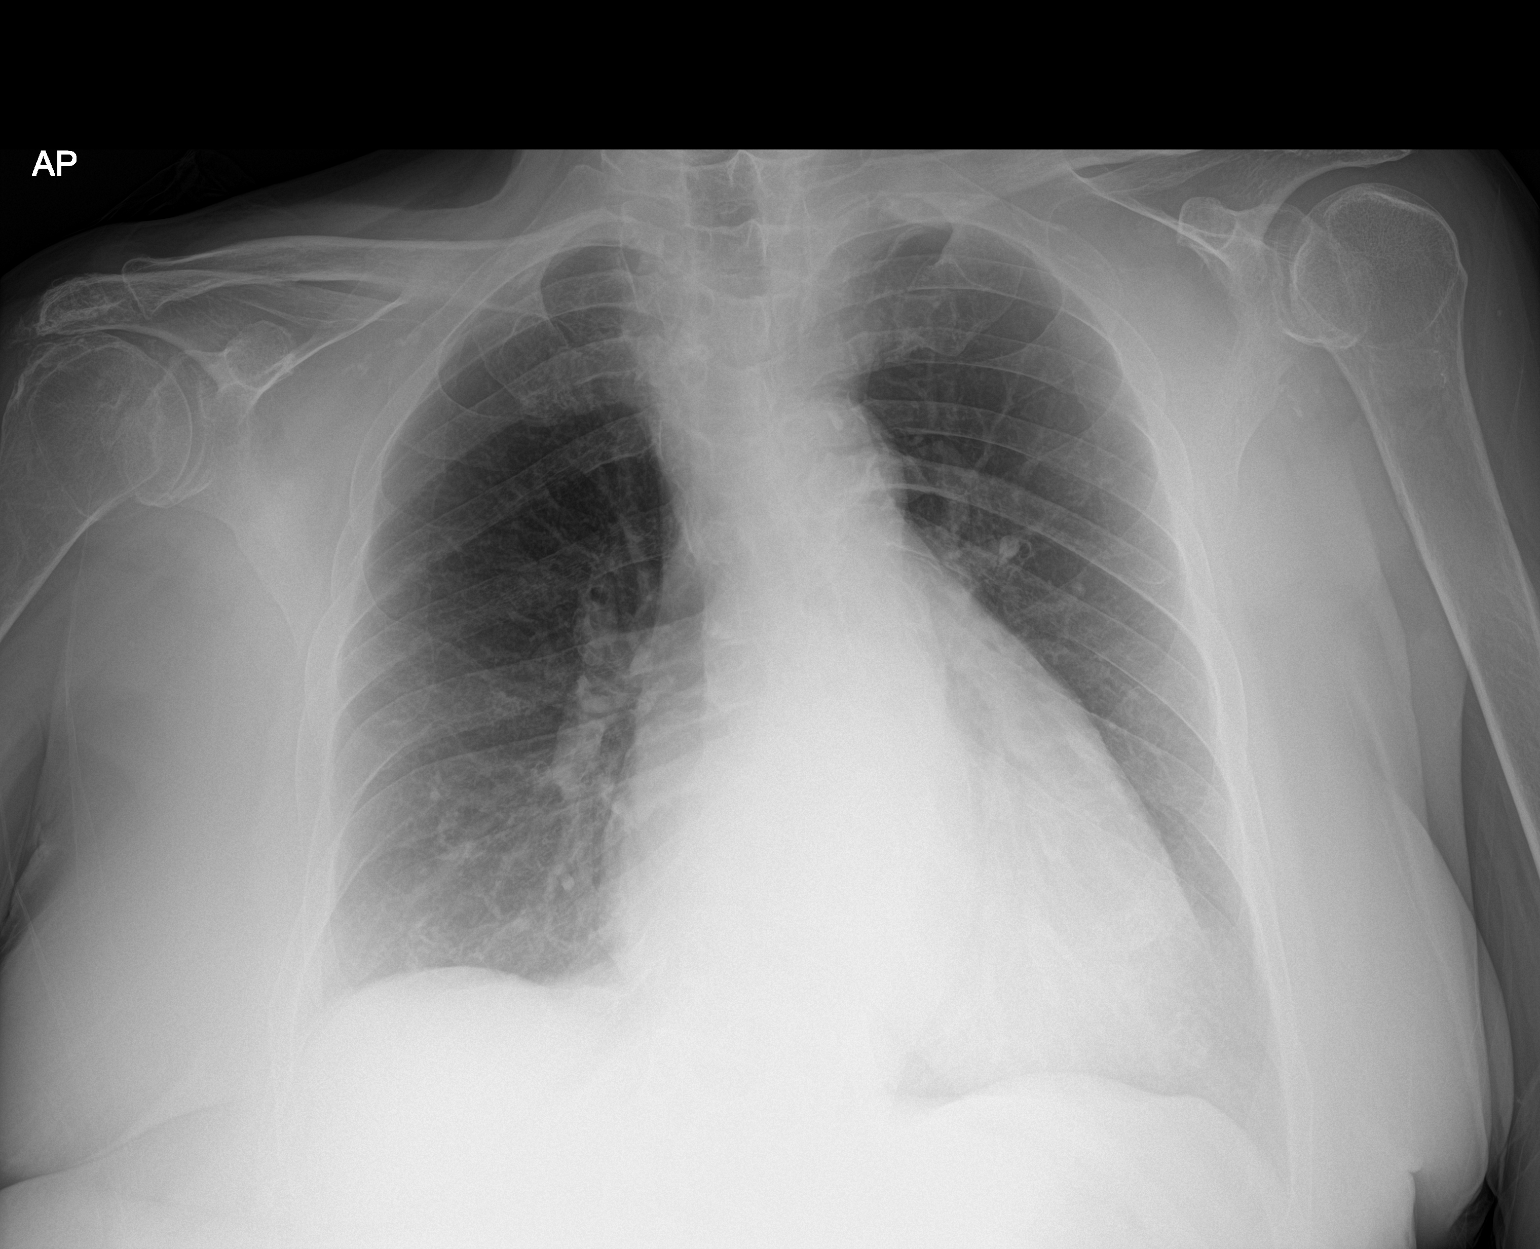

[1 of 1 positions shown; findings below may reference images not displayed]

FINDINGS: Stable cardiomegaly. Normal pulmonary vascularity. Atherosclerotic
calcification of the aortic arch. No focal consolidation, pleural
effusion, or pneumothorax. No acute osseous abnormality.
IMPRESSION: 1. No active disease.

## 2023-08-30 ENCOUNTER — Emergency Department (HOSPITAL_BASED_OUTPATIENT_CLINIC_OR_DEPARTMENT_OTHER): Payer: Medicare Other

## 2023-08-30 ENCOUNTER — Encounter (HOSPITAL_BASED_OUTPATIENT_CLINIC_OR_DEPARTMENT_OTHER): Payer: Self-pay | Admitting: Emergency Medicine

## 2023-08-30 ENCOUNTER — Emergency Department (HOSPITAL_BASED_OUTPATIENT_CLINIC_OR_DEPARTMENT_OTHER)
Admission: EM | Admit: 2023-08-30 | Discharge: 2023-08-30 | Disposition: A | Discharge status: Home / Self Care | Payer: Medicare Other | Attending: Emergency Medicine | Admitting: Emergency Medicine

## 2023-08-30 ENCOUNTER — Other Ambulatory Visit: Payer: Self-pay

## 2023-08-30 DIAGNOSIS — L0390 Cellulitis, unspecified: Secondary | ICD-10-CM

## 2023-08-30 DIAGNOSIS — Z20822 Contact with and (suspected) exposure to covid-19: Secondary | ICD-10-CM | POA: Diagnosis not present

## 2023-08-30 DIAGNOSIS — L03113 Cellulitis of right upper limb: Secondary | ICD-10-CM | POA: Diagnosis not present

## 2023-08-30 DIAGNOSIS — W19XXXA Unspecified fall, initial encounter: Secondary | ICD-10-CM

## 2023-08-30 DIAGNOSIS — R4182 Altered mental status, unspecified: Secondary | ICD-10-CM | POA: Insufficient documentation

## 2023-08-30 DIAGNOSIS — W06XXXA Fall from bed, initial encounter: Secondary | ICD-10-CM | POA: Insufficient documentation

## 2023-08-30 LAB — URINALYSIS, ROUTINE W REFLEX MICROSCOPIC
Bilirubin Urine: NEGATIVE
Glucose, UA: NEGATIVE mg/dL
Ketones, ur: NEGATIVE mg/dL
Nitrite: NEGATIVE
Protein, ur: NEGATIVE mg/dL
Specific Gravity, Urine: 1.015 (ref 1.005–1.030)
pH: 7 (ref 5.0–8.0)

## 2023-08-30 LAB — CBC WITH DIFFERENTIAL/PLATELET
Abs Immature Granulocytes: 0.03 10*3/uL (ref 0.00–0.07)
Basophils Absolute: 0.1 10*3/uL (ref 0.0–0.1)
Basophils Relative: 1 %
Eosinophils Absolute: 0.1 10*3/uL (ref 0.0–0.5)
Eosinophils Relative: 1 %
HCT: 45.4 % (ref 36.0–46.0)
Hemoglobin: 14.8 g/dL (ref 12.0–15.0)
Immature Granulocytes: 0 %
Lymphocytes Relative: 18 %
Lymphs Abs: 1.8 10*3/uL (ref 0.7–4.0)
MCH: 27.8 pg (ref 26.0–34.0)
MCHC: 32.6 g/dL (ref 30.0–36.0)
MCV: 85.3 fL (ref 80.0–100.0)
Monocytes Absolute: 1.2 10*3/uL — ABNORMAL HIGH (ref 0.1–1.0)
Monocytes Relative: 12 %
Neutro Abs: 6.8 10*3/uL (ref 1.7–7.7)
Neutrophils Relative %: 68 %
Platelets: 157 10*3/uL (ref 150–400)
RBC: 5.32 MIL/uL — ABNORMAL HIGH (ref 3.87–5.11)
RDW: 21.5 % — ABNORMAL HIGH (ref 11.5–15.5)
WBC: 9.9 10*3/uL (ref 4.0–10.5)
nRBC: 0 % (ref 0.0–0.2)

## 2023-08-30 LAB — COMPREHENSIVE METABOLIC PANEL
ALT: 13 U/L (ref 0–44)
AST: 19 U/L (ref 15–41)
Albumin: 3.1 g/dL — ABNORMAL LOW (ref 3.5–5.0)
Alkaline Phosphatase: 83 U/L (ref 38–126)
Anion gap: 10 (ref 5–15)
BUN: 17 mg/dL (ref 8–23)
CO2: 18 mmol/L — ABNORMAL LOW (ref 22–32)
Calcium: 8.8 mg/dL — ABNORMAL LOW (ref 8.9–10.3)
Chloride: 104 mmol/L (ref 98–111)
Creatinine, Ser: 1.16 mg/dL — ABNORMAL HIGH (ref 0.44–1.00)
GFR, Estimated: 47 mL/min — ABNORMAL LOW (ref >60)
Glucose, Bld: 120 mg/dL — ABNORMAL HIGH (ref 70–99)
Potassium: 4.7 mmol/L (ref 3.5–5.1)
Sodium: 132 mmol/L — ABNORMAL LOW (ref 135–145)
Total Bilirubin: 1.7 mg/dL — ABNORMAL HIGH (ref 0.0–1.2)
Total Protein: 6.7 g/dL (ref 6.5–8.1)

## 2023-08-30 LAB — RESP PANEL BY RT-PCR (RSV, FLU A&B, COVID)  RVPGX2
Influenza A by PCR: NEGATIVE
Influenza B by PCR: NEGATIVE
Resp Syncytial Virus by PCR: NEGATIVE
SARS Coronavirus 2 by RT PCR: NEGATIVE

## 2023-08-30 LAB — URINALYSIS, MICROSCOPIC (REFLEX)

## 2023-08-30 MED ORDER — CEPHALEXIN 250 MG PO CAPS
500.0000 mg | ORAL_CAPSULE | Freq: Once | ORAL | Status: AC
Start: 2023-08-30 — End: 2023-08-30
  Administered 2023-08-30: 500 mg via ORAL
  Filled 2023-08-30: qty 2

## 2023-08-30 MED ORDER — LACTATED RINGERS IV BOLUS
1000.0000 mL | Freq: Once | INTRAVENOUS | Status: AC
  Administered 2023-08-30: 1000 mL via INTRAVENOUS

## 2023-08-30 MED ORDER — CEPHALEXIN 500 MG PO CAPS: 500.0000 mg | ORAL_CAPSULE | Freq: Three times a day (TID) | ORAL | 0 refills | Status: AC

## 2023-08-30 NOTE — ED Triage Notes (Addendum)
Pt reports "sliding off the bed" and was found laying on the floor by her bed, was found by her son, called EMS, brought by dtr to ED, dtr reports pt has been feeling weak and having body aches x 2d, denies fever, cough, ShoB, CP, n/v/d  Also reports swelling & pain on rt hand, erythema noted, unsure if caused by injury or insect bite, grip is weak in that extremity

## 2023-08-30 NOTE — ED Provider Notes (Signed)
[place] [facility] AT [facility] Provider Note   CSN: [id number] Arrival date & time: 08/30/23  1413     History Chief Complaint  Patient presents with   Fall    HPI Jill Crane is a 83 y.o. female presenting for fall. Family came to picked her up and found her laying next to the bed, too weak to get up. Has been weaker than normal over the past few days. Denies fevers and chils/N/V or any localizing symptoms. HX of UTI causing similar symptoms.   Patient's recorded medical, surgical, social, medication list and allergies were reviewed in the Snapshot window as part of the initial history.   Review of Systems   Review of Systems  Constitutional:  Positive for fatigue. Negative for chills and fever.  HENT:  Negative for ear pain and sore throat.   Eyes:  Negative for pain and visual disturbance.  Respiratory:  Negative for cough and shortness of breath.   Cardiovascular:  Negative for chest pain and palpitations.  Gastrointestinal:  Negative for abdominal pain and vomiting.  Genitourinary:  Negative for dysuria and hematuria.  Musculoskeletal:  Negative for arthralgias and back pain.  Skin:  Negative for color change and rash.  Neurological:  Positive for weakness. Negative for seizures and syncope.  All other systems reviewed and are negative.   Physical Exam Updated Vital Signs BP 102/82   Pulse (!) 108   Temp 98.6 F (37 C) (Oral)   Resp (!) 24   Ht 5\' 2"  (1.575 m)   Wt 72.1 kg   SpO2 99%   BMI 29.08 kg/m  Physical Exam Vitals and nursing note reviewed.  Constitutional:      General: She is not in acute distress.    Appearance: She is well-developed.  HENT:     Head: Normocephalic and atraumatic.  Eyes:     Conjunctiva/sclera: Conjunctivae normal.  Cardiovascular:     Rate and Rhythm: Normal rate and regular rhythm.     Heart sounds: No murmur heard. Pulmonary:     Effort: Pulmonary effort is normal. No respiratory distress.      Breath sounds: Normal breath sounds.  Abdominal:     General: There is no distension.     Palpations: Abdomen is soft.     Tenderness: There is no abdominal tenderness. There is no right CVA tenderness or left CVA tenderness.  Musculoskeletal:        General: Swelling (right arm swelling 1+) present. No tenderness, deformity or signs of injury. Normal range of motion.     Cervical back: Neck supple.  Skin:    General: Skin is warm and dry.  Neurological:     General: No focal deficit present.     Mental Status: She is alert and oriented to person, place, and time. Mental status is at baseline.     Cranial Nerves: No cranial nerve deficit.      ED Course/ Medical Decision Making/ A&P    Procedures Procedures   Medications Ordered in ED Medications  cephALEXin (KEFLEX) capsule 500 mg (has no administration in time range)  lactated ringers bolus 1,000 mL (0 mLs Intravenous Stopped 08/30/23 1721)   Medical Decision Making:   Jill Crane is a 83 y.o. female who presented to the ED today with altered mental status detailed above.    Patient placed on continuous vitals and telemetry monitoring while in ED which was reviewed periodically.  Complete initial physical exam performed, notably the  patient  was hemodynamically stable no acute distress.    Reviewed and confirmed nursing documentation for past medical history, family history, social history.    Initial Assessment:   With the patient's presentation of altered mental status, most likely diagnosis is delerium 2/2 infectious etiology (UTI/CAP/URI) vs metabolic abnormality (Na/K/Mg/Ca) vs nonspecific etiology. Other diagnoses were considered including (but not limited to) CVA, ICH, intracranial mass, critical dehydration, heptatic dysfunction, uremia, hypercarbia, intoxication, endrocrine abnormality, toxidrome. These are considered less likely due to history of present illness and physical exam findings.   This is most  consistent with an acute life/limb threatening illness complicated by underlying chronic conditions.  Initial Plan:  CTH and C-spine to evaluate for intracranial etiology of patient's symptoms as well as traumatic injury XR right arm to evaluate for the small amount of swelling to see for underlying injury,  Screening labs including CBC and Metabolic panel to evaluate for infectious or metabolic etiology of disease.  Urinalysis with reflex culture ordered to evaluate for UTI or relevant urologic/nephrologic pathology.  CXR to evaluate for structural/infectious intrathoracic pathology.  EKG to evaluate for cardiac pathology Objective evaluation as below reviewed   Initial Study Results:   Laboratory  All laboratory results reviewed without evidence of clinically relevant pathology.    EKG EKG was reviewed independently. Rate, rhythm, axis, intervals all examined and without medically relevant abnormality. ST segments without concerns for elevations.    Radiology:  All images reviewed independently. Agree with radiology report at this time.   DG Chest 1 View Result Date: 08/30/2023 CLINICAL DATA:  Weakness. Set off bed and was found on floor by son. EXAM: CHEST  1 VIEW COMPARISON:  Chest radiographs 06/10/2023, 05/30/2023 FINDINGS: Left chest wall cardiac pacer is seen with lead overlying the right ventricle. Moderate enlargement of the cardiac silhouette, unchanged. Moderate atherosclerotic calcifications within the aortic arch. The lungs are clear. No pleural effusion pneumothorax. No pulmonary edema. As seen on prior CT, note is made of a T8 butterfly and wedging of the right T7-T9 vertebral bodies. IMPRESSION: 1. No acute cardiopulmonary process. 2. Moderate enlargement of the cardiac silhouette, unchanged. Electronically Signed   By: Neita Garnet M.D.   On: 08/30/2023 16:50   DG Hand Complete Right Result Date: 08/30/2023 CLINICAL DATA:  Right hand and wrist swelling and weakness. Injury  sliding off the bed. Found lying on the floor by son. EXAM: RIGHT HAND - COMPLETE 3+ VIEW; RIGHT WRIST - COMPLETE 3+ VIEW COMPARISON:  None Available. FINDINGS: Right wrist: There is diffuse decreased bone mineralization. Moderate calcification overlying the triangular fibrocartilage complex. Mild radiocarpal joint space narrowing. Mild distal radial styloid degenerative osteophytosis. Mild thumb carpometacarpal joint space narrowing and peripheral osteophytosis. Mild-to-moderate dorsal proximal carpal row degenerative spurring on lateral view. Mild-to-moderate calcification outlining the radiocarpal joint capsule. Right hand: Mild-to-moderate second through fifth DIP joint space narrowing and peripheral osteophytosis. Mild index finger PIP joint space narrowing and peripheral osteophytosis. No acute fracture or dislocation. No cortical erosion. Mild to moderate atherosclerotic calcifications. IMPRESSION: 1. No acute fracture. 2. 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 3. Mild-to-moderate second through fifth DIP and index finger PIP osteoarthritis. 4. Moderate calcification overlying the triangular fibrocartilage complex. 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 Electronically Signed   By: Neita Garnet M.D.   On: 08/30/2023 16:45   DG Wrist Complete Right Result Date: 08/30/2023 CLINICAL DATA:  Right hand and wrist swelling and weakness. Injury sliding off the bed. Found lying on the floor by son.  EXAM: RIGHT HAND - COMPLETE 3+ VIEW; RIGHT WRIST - COMPLETE 3+ VIEW COMPARISON:  None Available. FINDINGS: Right wrist: There is diffuse decreased bone mineralization. Moderate calcification overlying the triangular fibrocartilage complex. Mild radiocarpal joint space narrowing. Mild distal radial styloid degenerative osteophytosis. Mild thumb carpometacarpal joint space narrowing and peripheral osteophytosis. Mild-to-moderate dorsal proximal carpal row degenerative spurring on lateral  view. Mild-to-moderate calcification outlining the radiocarpal joint capsule. Right hand: Mild-to-moderate second through fifth DIP joint space narrowing and peripheral osteophytosis. Mild index finger PIP joint space narrowing and peripheral osteophytosis. No acute fracture or dislocation. No cortical erosion. Mild to moderate atherosclerotic calcifications. IMPRESSION: 1. No acute fracture. 2. 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 3. Mild-to-moderate second through fifth DIP and index finger PIP osteoarthritis. 4. Moderate calcification overlying the triangular fibrocartilage complex. 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 Electronically Signed   By: Neita Garnet M.D.   On: 08/30/2023 16:45   CT Head Wo Contrast Result Date: 08/30/2023 CLINICAL DATA:  Syncope/presyncope, cerebrovascular cause suspected Fall; Ataxia, cervical trauma Fall EXAM: CT HEAD WITHOUT CONTRAST CT CERVICAL SPINE WITHOUT CONTRAST TECHNIQUE: Multidetector CT imaging of the head and cervical spine was performed following the standard protocol without intravenous contrast. Multiplanar CT image reconstructions of the cervical spine were also generated. RADIATION DOSE REDUCTION: This exam was performed according to the departmental dose-optimization program which includes automated exposure control, adjustment of the mA and/or kV according to patient size and/or use of iterative reconstruction technique. COMPARISON:  Head CT 01/11/2022 FINDINGS: CT HEAD FINDINGS Brain: No evidence of acute infarction, hemorrhage, hydrocephalus, extra-axial collection or mass lesion/mass effect. Chronic right cerebellar infarct. Background of mild chronic microvascular ischemic change. Generalized volume loss without lobar predominance. Vascular: No hyperdense vessel or unexpected calcification. Skull: Normal. Negative for fracture or focal lesion. Sinuses/Orbits: No middle ear or mastoid effusion. Paranasal sinuses are clear.  Bilateral lens replacement. Orbits are otherwise unremarkable. Other: None. CT CERVICAL SPINE FINDINGS Alignment: Straightening of the normal cervical lordosis Skull base and vertebrae: No acute fracture. No primary bone lesion or focal pathologic process. There is radial remodeling of the cervical vertebral bodies. Soft tissues and spinal canal: No prevertebral fluid or swelling. No visible canal hematoma. Severe atherosclerotic plaque of the carotid bifurcations bilaterally. Disc levels:  No CT evidence of high-grade spinal canal stenosis Upper chest: Negative. Other: None IMPRESSION: 1. No acute intracranial abnormality. Chronic right cerebellar infarct. 2. No acute fracture or traumatic subluxation of the cervical spine. Electronically Signed   By: Lorenza Cambridge M.D.   On: 08/30/2023 16:35   CT Cervical Spine Wo Contrast Result Date: 08/30/2023 CLINICAL DATA:  Syncope/presyncope, cerebrovascular cause suspected Fall; Ataxia, cervical trauma Fall EXAM: CT HEAD WITHOUT CONTRAST CT CERVICAL SPINE WITHOUT CONTRAST TECHNIQUE: Multidetector CT imaging of the head and cervical spine was performed following the standard protocol without intravenous contrast. Multiplanar CT image reconstructions of the cervical spine were also generated. RADIATION DOSE REDUCTION: This exam was performed according to the departmental dose-optimization program which includes automated exposure control, adjustment of the mA and/or kV according to patient size and/or use of iterative reconstruction technique. COMPARISON:  Head CT 01/11/2022 FINDINGS: CT HEAD FINDINGS Brain: No evidence of acute infarction, hemorrhage, hydrocephalus, extra-axial collection or mass lesion/mass effect. Chronic right cerebellar infarct. Background of mild chronic microvascular ischemic change. Generalized volume loss without lobar predominance. Vascular: No hyperdense vessel or unexpected calcification. Skull: Normal. Negative for fracture or focal lesion.  Sinuses/Orbits: No middle ear or mastoid effusion. Paranasal sinuses are  clear. Bilateral lens replacement. Orbits are otherwise unremarkable. Other: None. CT CERVICAL SPINE FINDINGS Alignment: Straightening of the normal cervical lordosis Skull base and vertebrae: No acute fracture. No primary bone lesion or focal pathologic process. There is radial remodeling of the cervical vertebral bodies. Soft tissues and spinal canal: No prevertebral fluid or swelling. No visible canal hematoma. Severe atherosclerotic plaque of the carotid bifurcations bilaterally. Disc levels:  No CT evidence of high-grade spinal canal stenosis Upper chest: Negative. Other: None IMPRESSION: 1. No acute intracranial abnormality. Chronic right cerebellar infarct. 2. No acute fracture or traumatic subluxation of the cervical spine. Electronically Signed   By: Lorenza Cambridge M.D.   On: 08/30/2023 16:35    Final Assessment and Plan:   Reassessed at bedside.  Notably family stated that the patient looks much stronger and feels much better.  No acute pathology on my exam.  She does have some overlying redness over her dorsal aspect and a spot that appears to be a bug bite.  She likely has cellulitis from the skin piercing in the setting of her other comorbid medical problems. Does not have any fluctuance consistent with an abscess.  Will treat with Keflex p.o. recommend follow-up with PCP within 48 hours. Patient has a diagnosis of A-fib is on warfarin for management as well as rate control with metoprolol.  Rate has remained in the low 100s while here in the emergency room.  Does not require any further intervention for RVR. Ambulatory tolerating p.o. intake on serial reassessments feels comfortable outpatient care management strict return precautions reinforced.    Clinical Impression:  1. Cellulitis, unspecified cellulitis site   2. Fall, initial encounter      Data Unavailable   Final Clinical Impression(s) / ED  Diagnoses Final diagnoses:  Cellulitis, unspecified cellulitis site  Fall, initial encounter    Rx / DC Orders ED Discharge Orders          Ordered    cephALEXin (KEFLEX) 500 MG capsule  3 times daily        08/30/23 1832              Glyn Ade, MD 08/30/23 [phone]

## 2023-09-17 ENCOUNTER — Ambulatory Visit (HOSPITAL_COMMUNITY)
Admission: RE | Admit: 2023-09-17 | Discharge: 2023-09-17 | Disposition: A | Discharge status: Home / Self Care | Payer: Medicare Other | Source: Ambulatory Visit | Attending: Surgery | Admitting: Surgery

## 2023-09-17 ENCOUNTER — Ambulatory Visit: Payer: Medicare Other | Admitting: Physician Assistant

## 2023-09-17 ENCOUNTER — Ambulatory Visit (INDEPENDENT_AMBULATORY_CARE_PROVIDER_SITE_OTHER)
Admission: RE | Admit: 2023-09-17 | Discharge: 2023-09-17 | Disposition: A | Discharge status: Home / Self Care | Payer: Medicare Other | Source: Ambulatory Visit | Attending: Surgery | Admitting: Surgery

## 2023-09-17 VITALS — BP 126/85 | HR 64 | Temp 97.1°F | Resp 20 | Ht 62.0 in | Wt 167.6 lb

## 2023-09-17 DIAGNOSIS — I70229 Atherosclerosis of native arteries of extremities with rest pain, unspecified extremity: Secondary | ICD-10-CM | POA: Diagnosis not present

## 2023-09-17 DIAGNOSIS — I7409 Other arterial embolism and thrombosis of abdominal aorta: Secondary | ICD-10-CM | POA: Diagnosis not present

## 2023-09-17 LAB — VAS US ABI WITH/WO TBI
Left ABI: 0.92
Right ABI: 0.89

## 2023-09-17 NOTE — Progress Notes (Signed)
 Office Note     CC:  follow up Requesting Provider:  Zoila Shutter, MD  HPI: Jill Crane is a 83 y.o. (May 23, 1941) female who presents for surveillance of PAD.  She underwent aortogram with left common and external iliac artery stenting by Dr. Myra Gianotti in January 2024 due to lifestyle limiting claudication.  Claudication symptoms resolved after her procedure and she denies any return of symptoms.  She also denies any rest pain or tissue loss of bilateral lower extremities.  She has known bilateral flush SFA occlusions.  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  She follows regularly with her PCP for management of diabetes and other chronic medical conditions including hypertension.  She is also on Coumadin for atrial fibrillation.  Past Medical History:  Diagnosis Date   Atrial fibrillation with normal ventricular rate (HCC)    CHF (congestive heart failure) (HCC)    COPD (chronic obstructive pulmonary disease) (HCC)    Diabetes mellitus without complication (HCC)    Hx of blood clots    Hypertension    Pacemaker    Peripheral arterial disease (HCC)    Peripheral vascular disease (HCC)    Sepsis (HCC) 11/10/2019   SIRS (systemic inflammatory response syndrome) (HCC) 08/15/2020    Past Surgical History:  Procedure Laterality Date   ABDOMINAL AORTOGRAM W/LOWER EXTREMITY N/A 07/18/2022   Procedure: ABDOMINAL AORTOGRAM W/ Bilateral LOWER EXTREMITY Run[place];  Surgeon: Nada Libman, MD;  Location: [facility] IN[facility]AS[facility];  Service: Cardiovascular;  Laterality: N/A;   CHOLECYSTECTOMY N/A 04/12/2022   Procedure: LAPAROSCOPIC CHOLECYSTECTOMY WITH ICG;  Surgeon: Romie Levee, MD;  Location: [facility] ORS;  Service: General;  Laterality: N/A;   ENDOSCOPIC RETROGRADE CHOLANGIOPANCREATOGRAPHY (ERCP) WITH PROPOFOL N/A 01/22/2021   Procedure: ENDOSCOPIC RETROGRADE CHOLANGIOPANCREATOGRAPHY (ERCP) WITH PROPOFOL;  Surgeon: Hilarie Fredrickson, MD;  Location: [facility] ENDOSCOPY;  Service: Endoscopy;  Laterality: N/A;    ESOPHAGOGASTRODUODENOSCOPY (EGD) WITH PROPOFOL N/A 01/28/2021   Procedure: ESOPHAGOGASTRODUODENOSCOPY (EGD) WITH PROPOFOL;  Surgeon: Iva Boop, MD;  Location: [facility] ENDOSCOPY;  Service: Endoscopy;  Laterality: N/A;   IR PERC CHOLECYSTOSTOMY  01/30/2022   IR PERC CHOLECYSTOSTOMY  03/28/2022   PERIPHERAL VASCULAR INTERVENTION Left 07/18/2022   Procedure: PERIPHERAL VASCULAR INTERVENTION;  Surgeon: Nada Libman, MD;  Location: [facility];  Service: Cardiovascular;  Laterality: Left;  left common iliac artery and left external iliac artery   REMOVAL OF STONES  01/22/2021   Procedure: REMOVAL OF STONES;  Surgeon: Hilarie Fredrickson, MD;  Location: [facility] ENDOSCOPY;  Service: Endoscopy;;   SPHINCTEROTOMY  01/22/2021   Procedure: Dennison Mascot;  Surgeon: Hilarie Fredrickson, MD;  Location: [facility] ENDOSCOPY;  Service: Endoscopy;;    Social History   Socioeconomic History   Marital status: Widowed    Spouse name: Not on file   Number of children: Not on file   Years of education: Not on file   Highest education level: Not on file  Occupational History   Not on file  Tobacco Use   Smoking status: Former    Types: Cigarettes   Smokeless tobacco: Never  Vaping Use   Vaping status: Never Used  Substance and Sexual Activity   Alcohol use: No   Drug use: Never   Sexual activity: Not on file  Other Topics Concern   Not on file  Social History Narrative   Not on file   Social Drivers of Health   Financial Resource Strain: Not on file  Food Insecurity: Low Risk  (06/10/2023)  Received from [facility]   Hunger Vital Sign    Worried About Running Out of Food in the Last Year: Never true    Ran Out of Food in the Last Year: Never true  Transportation Needs: No Transportation Needs (06/10/2023)   Received from [facility]    In the past 12 months, has lack of reliable transportation kept you from medical appointments, meetings, work or from getting things needed for daily  living? : No  Physical Activity: Not on file  Stress: Not on file  Social Connections: Not on file  Intimate Partner Violence: Not At Risk (04/08/2022)   Humiliation, Afraid, Rape, and Kick questionnaire    Fear of Current or Ex-Partner: No    Emotionally Abused: No    Physically Abused: No    Sexually Abused: No    Family History  Problem Relation Age of Onset   Stroke Mother    Diabetes Mother    Stroke Father     Current Outpatient Medications  Medication Sig Dispense Refill   acetaminophen (TYLENOL) 500 MG tablet Take 500 mg by mouth every 6 (six) hours as needed for mild pain.     albuterol (VENTOLIN HFA) 108 (90 Base) MCG/ACT inhaler Inhale 2 puffs into the lungs every 6 (six) hours as needed for wheezing or shortness of breath.     alendronate (FOSAMAX) 35 MG tablet Take 35 mg by mouth every Monday. Take with a full glass of water on an empty stomach.     bimatoprost (LUMIGAN) 0.01 % SOLN Place 1 drop into both eyes at bedtime.     brimonidine (ALPHAGAN) 0.2 % ophthalmic solution Place 1 drop into the left eye in the morning and at bedtime.     busPIRone (BUSPAR) 10 MG tablet Take 10 mg by mouth 2 (two) times daily.     calcium-vitamin D (OSCAL WITH D) 500-200 MG-UNIT per tablet Take 1 tablet by mouth daily.     cephALEXin (KEFLEX) 500 MG capsule Take 1 capsule (500 mg total) by mouth 3 (three) times daily. 15 capsule 0   dorzolamide (TRUSOPT) 2 % ophthalmic solution Place 1 drop into the left eye 2 (two) times daily.     fluocinonide ointment (LIDEX) 0.05 % Apply 1 Application topically 2 (two) times daily as needed (scalp irritation).     fluticasone (FLONASE) 50 MCG/ACT nasal spray Place 2 sprays into both nostrils daily as needed for allergies.     furosemide (LASIX) 20 MG tablet Take 1 tablet (20 mg total) by mouth daily. (Patient taking differently: Take 20 mg by mouth daily as needed (swelling).)     gabapentin (NEURONTIN) 300 MG capsule Take 300 mg by mouth at bedtime.      magnesium oxide (MAG-OX) 400 (241.3 Mg) MG tablet Take 400 mg by mouth in the morning and at bedtime.     metFORMIN (GLUCOPHAGE) 500 MG tablet Take 500 mg by mouth 2 (two) times daily.     metoprolol succinate (TOPROL-XL) 25 MG 24 hr tablet Take 25 mg by mouth daily.     omeprazole (PRILOSEC) 20 MG capsule Take 20 mg by mouth daily.     predniSONE (DELTASONE) 2.5 MG tablet Take 7.5 mg by mouth daily.     silver sulfADIAZINE (SILVADENE) 1 % cream Apply 1 Application topically daily as needed (scalp irritation).     warfarin (COUMADIN) 2 MG tablet On Saturday and Sunday, take 2 tabs (4mg ), then resume normal regimen Take 1 tablet (  2 mg) daily except Take 2 tablets (4 mg) on (Tues & Fri) (Patient taking differently: Take 2-4 mg by mouth daily. Takes 2 mg (1 tab) daily except 3mg  Wednesdays) 30 tablet 1   rosuvastatin (CRESTOR) 10 MG tablet Take 1 tablet (10 mg total) by mouth daily. 30 tablet 11   No current facility-administered medications for this visit.    No Known Allergies   REVIEW OF SYSTEMS:   [X]  denotes positive finding, [ ]  denotes negative finding Cardiac  Comments:  Chest pain or chest pressure:    Shortness of breath upon exertion:    Short of breath when lying flat:    Irregular heart rhythm:        Vascular    Pain in calf, thigh, or hip brought on by ambulation:    Pain in feet at night that wakes you up from your sleep:     Blood clot in your veins:    Leg swelling:         Pulmonary    Oxygen at home:    Productive cough:     Wheezing:         Neurologic    Sudden weakness in arms or legs:     Sudden numbness in arms or legs:     Sudden onset of difficulty speaking or slurred speech:    Temporary loss of vision in one eye:     Problems with dizziness:         Gastrointestinal    Blood in stool:     Vomited blood:         Genitourinary    Burning when urinating:     Blood in urine:        Psychiatric    Major depression:         Hematologic     Bleeding problems:    Problems with blood clotting too easily:        Skin    Rashes or ulcers:        Constitutional    Fever or chills:      PHYSICAL EXAMINATION:  Vitals:   09/17/23 0839  BP: 126/85  Pulse: 64  Resp: 20  Temp: (!) 97.1 F (36.2 C)  TempSrc: Temporal  SpO2: 98%  Weight: 167 lb 9.6 oz (76 kg)  Height: 5\' 2"  (1.575 m)    General:  WDWN in NAD; vital signs documented above Gait: Not observed HENT: WNL, normocephalic Pulmonary: normal non-labored breathing , without Rales, rhonchi,  wheezing Cardiac: regular HR Abdomen: soft, NT, no masses Skin: without rashes Vascular Exam/Pulses: feet warm with good cap refill; pedal pulses absent Extremities: without ischemic changes, without Gangrene , without cellulitis; without open wounds;  Musculoskeletal: no muscle wasting or atrophy  Neurologic: A&O X 3 Psychiatric:  The pt has Normal affect.   Non-Invasive Vascular Imaging:   Left common and external iliac stents widely patent  ABI/TBIToday's ABIToday's TBIPrevious ABIPrevious TBI  +-------+-----------+-----------+------------+------------+  Right 0.89       0.72       0.94        0.68          +-------+-----------+-----------+------------+------------+  Left  0.92       0.65       1.08        0.89          +-------+-----------+-----------+------------+------------+    ASSESSMENT/PLAN:: 83 y.o. female here for follow up for surveillance of PAD with history of left common  and external iliac artery stenting  Bilateral lower extremities are warm and well-perfused on exam.  Duplex demonstrates widely patent left common and external iliac artery stents.  She has known bilateral SFA occlusions but is without claudication, rest pain, and tissue loss.  No indication for revascularization of SFAs currently.  We will repeat aortoiliac duplex and ABIs in 1 year.  She will continue her statin daily.  She will call/return office sooner with any  questions or concerns.   Emilie Rutter, PA-C [facility] [phone]  Clinic MD:   Myra Gianotti

## 2023-10-02 ENCOUNTER — Other Ambulatory Visit (HOSPITAL_BASED_OUTPATIENT_CLINIC_OR_DEPARTMENT_OTHER): Payer: Self-pay

## 2023-10-02 ENCOUNTER — Emergency Department (HOSPITAL_BASED_OUTPATIENT_CLINIC_OR_DEPARTMENT_OTHER)
Admission: EM | Admit: 2023-10-02 | Discharge: 2023-10-02 | Disposition: A | Discharge status: Home / Self Care | Attending: Emergency Medicine | Admitting: Emergency Medicine

## 2023-10-02 ENCOUNTER — Other Ambulatory Visit: Payer: Self-pay

## 2023-10-02 ENCOUNTER — Emergency Department (HOSPITAL_BASED_OUTPATIENT_CLINIC_OR_DEPARTMENT_OTHER)

## 2023-10-02 ENCOUNTER — Encounter (HOSPITAL_BASED_OUTPATIENT_CLINIC_OR_DEPARTMENT_OTHER): Payer: Self-pay

## 2023-10-02 DIAGNOSIS — E119 Type 2 diabetes mellitus without complications: Secondary | ICD-10-CM | POA: Insufficient documentation

## 2023-10-02 DIAGNOSIS — R0602 Shortness of breath: Secondary | ICD-10-CM

## 2023-10-02 DIAGNOSIS — Z7901 Long term (current) use of anticoagulants: Secondary | ICD-10-CM | POA: Insufficient documentation

## 2023-10-02 DIAGNOSIS — Z7984 Long term (current) use of oral hypoglycemic drugs: Secondary | ICD-10-CM | POA: Diagnosis not present

## 2023-10-02 DIAGNOSIS — I110 Hypertensive heart disease with heart failure: Secondary | ICD-10-CM | POA: Insufficient documentation

## 2023-10-02 DIAGNOSIS — Z7951 Long term (current) use of inhaled steroids: Secondary | ICD-10-CM | POA: Insufficient documentation

## 2023-10-02 DIAGNOSIS — Z79899 Other long term (current) drug therapy: Secondary | ICD-10-CM | POA: Insufficient documentation

## 2023-10-02 DIAGNOSIS — I509 Heart failure, unspecified: Secondary | ICD-10-CM | POA: Diagnosis not present

## 2023-10-02 DIAGNOSIS — J441 Chronic obstructive pulmonary disease with (acute) exacerbation: Secondary | ICD-10-CM | POA: Diagnosis not present

## 2023-10-02 LAB — CBC WITH DIFFERENTIAL/PLATELET
Abs Immature Granulocytes: 0.02 10*3/uL (ref 0.00–0.07)
Basophils Absolute: 0.1 10*3/uL (ref 0.0–0.1)
Basophils Relative: 1 %
Eosinophils Absolute: 0 10*3/uL (ref 0.0–0.5)
Eosinophils Relative: 0 %
HCT: 41.9 % (ref 36.0–46.0)
Hemoglobin: 13.1 g/dL (ref 12.0–15.0)
Immature Granulocytes: 0 %
Lymphocytes Relative: 21 %
Lymphs Abs: 1.6 10*3/uL (ref 0.7–4.0)
MCH: 28.7 pg (ref 26.0–34.0)
MCHC: 31.3 g/dL (ref 30.0–36.0)
MCV: 91.9 fL (ref 80.0–100.0)
Monocytes Absolute: 0.8 10*3/uL (ref 0.1–1.0)
Monocytes Relative: 10 %
Neutro Abs: 5.3 10*3/uL (ref 1.7–7.7)
Neutrophils Relative %: 68 %
Platelets: 184 10*3/uL (ref 150–400)
RBC: 4.56 MIL/uL (ref 3.87–5.11)
RDW: 20.6 % — ABNORMAL HIGH (ref 11.5–15.5)
WBC: 7.7 10*3/uL (ref 4.0–10.5)
nRBC: 0 % (ref 0.0–0.2)

## 2023-10-02 LAB — COMPREHENSIVE METABOLIC PANEL
ALT: 35 U/L (ref 0–44)
AST: 49 U/L — ABNORMAL HIGH (ref 15–41)
Albumin: 3 g/dL — ABNORMAL LOW (ref 3.5–5.0)
Alkaline Phosphatase: 65 U/L (ref 38–126)
Anion gap: 9 (ref 5–15)
BUN: 15 mg/dL (ref 8–23)
CO2: 23 mmol/L (ref 22–32)
Calcium: 9.1 mg/dL (ref 8.9–10.3)
Chloride: 110 mmol/L (ref 98–111)
Creatinine, Ser: 0.93 mg/dL (ref 0.44–1.00)
GFR, Estimated: >60 mL/min (ref >60)
Glucose, Bld: 125 mg/dL — ABNORMAL HIGH (ref 70–99)
Potassium: 3.8 mmol/L (ref 3.5–5.1)
Sodium: 142 mmol/L (ref 135–145)
Total Bilirubin: 1.4 mg/dL — ABNORMAL HIGH (ref 0.0–1.2)
Total Protein: 6 g/dL — ABNORMAL LOW (ref 6.5–8.1)

## 2023-10-02 LAB — PROTIME-INR
INR: 1.8 — ABNORMAL HIGH (ref 0.8–1.2)
Prothrombin Time: 21.1 s — ABNORMAL HIGH (ref 11.4–15.2)

## 2023-10-02 LAB — BRAIN NATRIURETIC PEPTIDE: B Natriuretic Peptide: 571.9 pg/mL — ABNORMAL HIGH (ref 0.0–100.0)

## 2023-10-02 LAB — TROPONIN I (HIGH SENSITIVITY)
Troponin I (High Sensitivity): 15 ng/L (ref <18)
Troponin I (High Sensitivity): 23 ng/L — ABNORMAL HIGH (ref <18)
Troponin I (High Sensitivity): 27 ng/L — ABNORMAL HIGH (ref <18)

## 2023-10-02 LAB — RESP PANEL BY RT-PCR (RSV, FLU A&B, COVID)  RVPGX2
Influenza A by PCR: NEGATIVE
Influenza B by PCR: NEGATIVE
Resp Syncytial Virus by PCR: NEGATIVE
SARS Coronavirus 2 by RT PCR: NEGATIVE

## 2023-10-02 MED ORDER — IPRATROPIUM-ALBUTEROL 0.5-2.5 (3) MG/3ML IN SOLN
3.0000 mL | Freq: Once | RESPIRATORY_TRACT | Status: AC
  Administered 2023-10-02: 3 mL via RESPIRATORY_TRACT
  Filled 2023-10-02: qty 3

## 2023-10-02 MED ORDER — ALBUTEROL SULFATE HFA 108 (90 BASE) MCG/ACT IN AERS: 1.0000 | INHALATION_SPRAY | Freq: Four times a day (QID) | RESPIRATORY_TRACT | 0 refills | Status: AC | PRN

## 2023-10-02 MED ORDER — ALBUTEROL SULFATE (2.5 MG/3ML) 0.083% IN NEBU: 2.5000 mg | INHALATION_SOLUTION | Freq: Four times a day (QID) | RESPIRATORY_TRACT | 12 refills | Status: AC | PRN

## 2023-10-02 MED ORDER — DOXYCYCLINE HYCLATE 100 MG PO CAPS
100.0000 mg | ORAL_CAPSULE | Freq: Two times a day (BID) | ORAL | 0 refills | Status: DC
Start: 2023-10-02 — End: 2023-10-02
  Filled 2023-10-02: qty 10, 5d supply, fill #0

## 2023-10-02 MED ORDER — PREDNISONE 20 MG PO TABS: 20.0000 mg | ORAL_TABLET | Freq: Every day | ORAL | 0 refills | Status: AC

## 2023-10-02 MED ORDER — ALBUTEROL SULFATE HFA 108 (90 BASE) MCG/ACT IN AERS
1.0000 | INHALATION_SPRAY | Freq: Four times a day (QID) | RESPIRATORY_TRACT | 0 refills | Status: DC | PRN
  Filled 2023-10-02: qty 1, fill #0

## 2023-10-02 MED ORDER — PREDNISONE 50 MG PO TABS
60.0000 mg | ORAL_TABLET | Freq: Once | ORAL | Status: AC
  Administered 2023-10-02: 60 mg via ORAL
  Filled 2023-10-02: qty 1

## 2023-10-02 MED ORDER — PREDNISONE 20 MG PO TABS
20.0000 mg | ORAL_TABLET | Freq: Every day | ORAL | 0 refills | Status: DC
  Filled 2023-10-02: qty 4, 4d supply, fill #0

## 2023-10-02 MED ORDER — DOXYCYCLINE HYCLATE 100 MG PO CAPS
100.0000 mg | ORAL_CAPSULE | Freq: Two times a day (BID) | ORAL | 0 refills | Status: DC
Start: 2023-10-02 — End: 2023-10-02
  Filled 2023-10-02: qty 14, 7d supply, fill #0

## 2023-10-02 MED ORDER — IOHEXOL 350 MG/ML SOLN
100.0000 mL | Freq: Once | INTRAVENOUS | Status: AC | PRN
  Administered 2023-10-02: 80 mL via INTRAVENOUS

## 2023-10-02 MED ORDER — FUROSEMIDE 10 MG/ML IJ SOLN
20.0000 mg | Freq: Once | INTRAMUSCULAR | Status: AC
  Administered 2023-10-02: 20 mg via INTRAVENOUS
  Filled 2023-10-02: qty 2

## 2023-10-02 MED ORDER — DOXYCYCLINE HYCLATE 100 MG PO CAPS: 100.0000 mg | ORAL_CAPSULE | Freq: Two times a day (BID) | ORAL | 0 refills | Status: AC

## 2023-10-02 NOTE — ED Provider Notes (Signed)
  Provider Note MRN:  [id number]  Arrival date & time: 10/02/23    ED Course and Medical Decision Making  Assumed care from Dr Lockie Mola  at shift change.  See note from prior team for complete details, in brief:  44 female Hx afib on warfarin, copd, dm, htn Here with complaint of difficulty breathing Labs reviewed, mildly elevated BNP, mildly elevated troponin but flat.  She is breathing comfortably on ambient air. Given Lasix, DuoNeb, steroids by prior team, plan to review CT and anticipate discharge if imaging is stable  CT has resulted, does have some findings consistent with CHF.  She is breathing comfortably, was started on diuresis here.  Ambulatory pulse ox completed without increased work of breathing. She is feeling much better, overall symptoms have greatly improved. Continue diuresis at home, treat for mild COPD exacerbation at home.  Follow-up PCP  The patient improved significantly and was discharged in stable condition. Detailed discussions were had with the patient/guardian regarding current findings, and need for close f/u with PCP or on call doctor. The patient/guardian has been instructed to return immediately if the symptoms worsen in any way for re-evaluation. Patient/guardian verbalized understanding and is in agreement with current care plan. All questions answered prior to discharge.     Procedures  Final Clinical Impressions(s) / ED Diagnoses     ICD-10-CM   1. Shortness of breath  R06.02     2. COPD exacerbation (HCC)  J44.1     3. Congestive heart failure, unspecified HF chronicity, unspecified heart failure type (HCC)  I50.9       ED Discharge Orders          Ordered    predniSONE (DELTASONE) 20 MG tablet  Daily,   Status:  Discontinued        10/02/23 1434    albuterol (VENTOLIN HFA) 108 (90 Base) MCG/ACT inhaler  Every 6 hours PRN,   Status:  Discontinued        10/02/23 1810    doxycycline (VIBRAMYCIN) 100 MG capsule  2 times daily,   Status:   Discontinued        10/02/23 1810    doxycycline (VIBRAMYCIN) 100 MG capsule  2 times daily,   Status:  Discontinued        10/02/23 1810    albuterol (VENTOLIN HFA) 108 (90 Base) MCG/ACT inhaler  Every 6 hours PRN        10/02/23 1825    albuterol (PROVENTIL) (2.5 MG/3ML) 0.083% nebulizer solution  Every 6 hours PRN        10/02/23 1825    doxycycline (VIBRAMYCIN) 100 MG capsule  2 times daily        10/02/23 1825    predniSONE (DELTASONE) 20 MG tablet  Daily        10/02/23 1825              Discharge Instructions      Overall suspect that you have mild fluid overload and recommend that you take 20 mg of your Lasix for the next 3 days starting tomorrow.  I already gave you your first dose of your IV.  Continue prednisone as prescribed with your next dose tomorrow as well.  Think you might have a small COPD exacerbation as well.  Follow-up with your primary care doctor.       Sloan Leiter, DO 10/02/23 1827

## 2023-10-02 NOTE — ED Provider Notes (Signed)
.  Ultrasound ED Peripheral IV (Provider)  Date/Time: 10/02/2023 5:29 PM  Performed by: Glyn Ade, MD Authorized by: Glyn Ade, MD   Procedure details:    Indications: multiple failed IV attempts and poor IV access     Skin Prep: chlorhexidine gluconate     Location:  Right AC   Angiocath:  20 G   Bedside Ultrasound Guided: Yes     Images: archived     Patient tolerated procedure without complications: Yes     Dressing applied: Yes       Glyn Ade, MD 10/02/23 1729

## 2023-10-02 NOTE — ED Notes (Signed)
 Patient ambulated with pulse os. SAT 93-94% and she walked very well. No SOB noted. MD aware

## 2023-10-02 NOTE — ED Triage Notes (Signed)
 Reports worsening SHOB for 1 week. States she had to use inhaler last week. Denies chest pain. Hx of COPD, heart failure

## 2023-10-02 NOTE — ED Provider Notes (Signed)
 [facility] Provider Note   CSN: [id number] Arrival date & time: 10/02/23  1045     History  Chief Complaint  Patient presents with   Shortness of Breath    Jill Crane is a 83 y.o. female.  Patient here with shortness of breath.  History of A-fib on Coumadin, history of COPD diabetes hypertension blood clots.  Intermittent shortness of breath for the last few days.  Nothing makes it worse or better.  Some increased leg swelling may be.  Denies any chest pain.  No fever.  Dry cough.  No nausea vomiting diarrhea or abdominal pain.  The history is provided by the patient.       Home Medications Prior to Admission medications   Medication Sig Start Date End Date Taking? Authorizing Provider  acetaminophen (TYLENOL) 500 MG tablet Take 500 mg by mouth every 6 (six) hours as needed for mild pain.    [provider]  albuterol (VENTOLIN HFA) 108 (90 Base) MCG/ACT inhaler Inhale 2 puffs into the lungs every 6 (six) hours as needed for wheezing or shortness of breath. 02/02/22   Almon Hercules, MD  alendronate (FOSAMAX) 35 MG tablet Take 35 mg by mouth every Monday. Take with a full glass of water on an empty stomach.    [provider]  bimatoprost (LUMIGAN) 0.01 % SOLN Place 1 drop into both eyes at bedtime.    [provider]  brimonidine (ALPHAGAN) 0.2 % ophthalmic solution Place 1 drop into the left eye in the morning and at bedtime. 10/21/19   [provider]  busPIRone (BUSPAR) 10 MG tablet Take 10 mg by mouth 2 (two) times daily. 08/04/20   [provider]  calcium-vitamin D (OSCAL WITH D) 500-200 MG-UNIT per tablet Take 1 tablet by mouth daily.    [provider]  cephALEXin (KEFLEX) 500 MG capsule Take 1 capsule (500 mg total) by mouth 3 (three) times daily. 08/30/23   Glyn Ade, MD  dorzolamide (TRUSOPT) 2 % ophthalmic solution Place 1 drop into the left eye 2 (two) times  daily. 08/03/20   [provider]  fluocinonide ointment (LIDEX) 0.05 % Apply 1 Application topically 2 (two) times daily as needed (scalp irritation). 12/27/21   [provider]  fluticasone (FLONASE) 50 MCG/ACT nasal spray Place 2 sprays into both nostrils daily as needed for allergies. 09/27/21   [provider]  furosemide (LASIX) 20 MG tablet Take 1 tablet (20 mg total) by mouth daily. Patient taking differently: Take 20 mg by mouth daily as needed (swelling). 01/28/21   Hollice Espy, MD  gabapentin (NEURONTIN) 300 MG capsule Take 300 mg by mouth at bedtime. 08/10/20   [provider]  magnesium oxide (MAG-OX) 400 (241.3 Mg) MG tablet Take 400 mg by mouth in the morning and at bedtime. 07/08/20   [provider]  metFORMIN (GLUCOPHAGE) 500 MG tablet Take 500 mg by mouth 2 (two) times daily. 08/24/20   [provider]  metoprolol succinate (TOPROL-XL) 25 MG 24 hr tablet Take 25 mg by mouth daily.    [provider]  omeprazole (PRILOSEC) 20 MG capsule Take 20 mg by mouth daily.    [provider]  predniSONE (DELTASONE) 2.5 MG tablet Take 7.5 mg by mouth daily.    [provider]  rosuvastatin (CRESTOR) 10 MG tablet Take 1 tablet (10 mg total) by mouth daily. 07/18/22 07/18/23  Nada Libman, MD  silver  sulfADIAZINE (SILVADENE) 1 % cream Apply 1 Application topically daily as needed (scalp irritation).    [provider]  warfarin (COUMADIN) 2 MG tablet On Saturday and Sunday, take 2 tabs (4mg ), then resume normal regimen Take 1 tablet (2 mg) daily except Take 2 tablets (4 mg) on (Tues & Fri) Patient taking differently: Take 2-4 mg by mouth daily. Takes 2 mg (1 tab) daily except 3mg  Wednesdays 01/28/21   Hollice Espy, MD      Allergies    Patient has no known allergies.    Review of Systems   Review of Systems  Physical Exam Updated Vital Signs BP (!) 148/108   Pulse 100   Resp (!) 25   Ht 5'  2" (1.575 m)   Wt 76 kg   SpO2 97%   BMI 30.65 kg/m  Physical Exam Vitals and nursing note reviewed.  Constitutional:      General: She is not in acute distress.    Appearance: She is well-developed.  HENT:     Head: Normocephalic and atraumatic.  Eyes:     Conjunctiva/sclera: Conjunctivae normal.     Pupils: Pupils are equal, round, and reactive to light.  Cardiovascular:     Rate and Rhythm: Normal rate and regular rhythm.     Pulses: Normal pulses.     Heart sounds: Normal heart sounds. No murmur heard. Pulmonary:     Effort: Pulmonary effort is normal. No respiratory distress.     Breath sounds: Wheezing present. No decreased breath sounds.  Abdominal:     Palpations: Abdomen is soft.     Tenderness: There is no abdominal tenderness.  Musculoskeletal:        General: No swelling.     Cervical back: Normal range of motion and neck supple.     Comments: Trace edema both legs left leg greater than the right leg but patient states baseline  Skin:    General: Skin is warm and dry.     Capillary Refill: Capillary refill takes less than 2 seconds.  Neurological:     General: No focal deficit present.     Mental Status: She is alert.  Psychiatric:        Mood and Affect: Mood normal.     ED Results / Procedures / Treatments   Labs (all labs ordered are listed, but only abnormal results are displayed) Labs Reviewed  CBC WITH DIFFERENTIAL/PLATELET - Abnormal; Notable for the following components:      Result Value   RDW 20.6 (*)    All other components within normal limits  BRAIN NATRIURETIC PEPTIDE - Abnormal; Notable for the following components:   B Natriuretic Peptide 571.9 (*)    All other components within normal limits  COMPREHENSIVE METABOLIC PANEL - Abnormal; Notable for the following components:   Glucose, Bld 125 (*)    Total Protein 6.0 (*)    Albumin 3.0 (*)    AST 49 (*)    Total Bilirubin 1.4 (*)    All other components within normal limits   PROTIME-INR - Abnormal; Notable for the following components:   Prothrombin Time 21.1 (*)    INR 1.8 (*)    All other components within normal limits  TROPONIN I (HIGH SENSITIVITY) - Abnormal; Notable for the following components:   Troponin I (High Sensitivity) 23 (*)    All other components within normal limits  RESP PANEL BY RT-PCR (RSV, FLU A&B, COVID)  [id number]  TROPONIN I (HIGH SENSITIVITY)  TROPONIN I (HIGH SENSITIVITY)    EKG EKG Interpretation Date/Time:  Tuesday October 02 2023 11:12:03 EDT Ventricular Rate:  97 PR Interval:    QRS Duration:  81 QT Interval:  318 QTC Calculation: 392 R Axis:   -32  Text Interpretation: Atrial fibrillation Aberrant conduction of SV complex(es) Probable left ventricular hypertrophy Confirmed by Virgina Norfolk [phone]) on 10/02/2023 11:14:46 AM  Radiology DG Chest Portable 1 View Result Date: 10/02/2023 CLINICAL DATA:  Shortness of breath for 1 week. EXAM: PORTABLE CHEST 1 VIEW COMPARISON:  08/30/2023 FINDINGS: The heart is mildly enlarged but appears stable. Stable tortuosity and calcification of the thoracic aorta. The pacer wires are stable. Vascular congestion, mild interstitial edema and small right pleural effusion all suggesting CHF. No infiltrates or pneumothorax. The bony thorax is intact. IMPRESSION: Findings suggest mild CHF. Electronically Signed   By: Rudie Meyer M.D.   On: 10/02/2023 14:17    Procedures Procedures    Medications Ordered in ED Medications  ipratropium-albuterol (DUONEB) 0.5-2.5 (3) MG/3ML nebulizer solution 3 mL (3 mLs Nebulization Given 10/02/23 1106)  furosemide (LASIX) injection 20 mg (20 mg Intravenous Given 10/02/23 1358)  predniSONE (DELTASONE) tablet 60 mg (60 mg Oral Given 10/02/23 1357)    ED Course/ Medical Decision Making/ A&P                                 Medical Decision Making Amount and/or Complexity of Data Reviewed Labs: ordered. Radiology: ordered.  Risk Prescription drug  management.   LAVITA PONTIUS is here with shortness of breath.  History of A-fib CHF diabetes hypertension COPD.  Unremarkable vitals.  No fever.  EKG showed rate controlled atrial fibrillation.  She is not having any chest pain.  I reviewed interpreted EKG.  Differential diagnosis could be COVID flu RSV, pneumonia, COPD exacerbation, volume overload seems less likely to be ACS.  She is well-appearing.  Will give DuoNeb treatment check CBC CMP troponin, chest x-ray, INR, BNP and reevaluate.  Ultimately per my review and interpretation of labs INR is a little bit subtherapeutic at 1.8.  Troponin were 15 and 23.  BNP mildly elevated in the 500s.  Will get a PE scan to fully rule out PE given that she is mildly subtherapeutic at 1.8 but I do think that this is likely more COPD/volume overload.  I have given her breathing treatment and steroids Lasix.  Will trend a third troponin given that she is waiting for CT scan.  I did not appreciate any obvious pneumonia on chest x-ray.  Some changes suggestive of some mild CHF otherwise.  COVID flu RSV testing unremarkable.  Will get a CT scan to further evaluate and if unremarkable anticipate discharge to home with Lasix for a few days and prednisone for few days.  She takes Lasix as needed and recommend that she probably take it for at least 2-3 more days and continue prednisone for 5-day burst.  Please see oncoming ED provider's note for further results, evaluation, disposition of the patient.  This chart was dictated using voice recognition software.  Despite best efforts to proofread,  errors can occur which can change the documentation meaning.         Final Clinical Impression(s) / ED Diagnoses Final diagnoses:  COPD exacerbation (HCC)  Shortness of breath    Rx / DC Orders ED Discharge Orders     None  Virgina Norfolk, DO 10/02/23 1428

## 2023-10-02 NOTE — ED Notes (Signed)
 RT called to assess patient upon arrival. BBS clear, but stated she has been having more SOB with exertion than usual. SAT 98% on RA.

## 2023-10-02 NOTE — Discharge Instructions (Addendum)
 Overall suspect that you have mild fluid overload and recommend that you take 20 mg of your Lasix for the next 3 days starting tomorrow.  I already gave you your first dose of your IV.  Continue prednisone as prescribed with your next dose tomorrow as well.  Think you might have a small COPD exacerbation as well.  Follow-up with your primary care doctor.

## 2023-10-03 ENCOUNTER — Other Ambulatory Visit (HOSPITAL_BASED_OUTPATIENT_CLINIC_OR_DEPARTMENT_OTHER): Payer: Self-pay

## 2023-12-28 ENCOUNTER — Emergency Department (HOSPITAL_BASED_OUTPATIENT_CLINIC_OR_DEPARTMENT_OTHER)
Admission: EM | Admit: 2023-12-28 | Discharge: 2023-12-28 | Disposition: A | Discharge status: Home / Self Care | Attending: Emergency Medicine | Admitting: Emergency Medicine

## 2023-12-28 ENCOUNTER — Emergency Department (HOSPITAL_BASED_OUTPATIENT_CLINIC_OR_DEPARTMENT_OTHER)

## 2023-12-28 ENCOUNTER — Encounter (HOSPITAL_BASED_OUTPATIENT_CLINIC_OR_DEPARTMENT_OTHER): Payer: Self-pay | Admitting: Emergency Medicine

## 2023-12-28 ENCOUNTER — Other Ambulatory Visit: Payer: Self-pay

## 2023-12-28 DIAGNOSIS — R0602 Shortness of breath: Secondary | ICD-10-CM | POA: Diagnosis not present

## 2023-12-28 DIAGNOSIS — R5383 Other fatigue: Secondary | ICD-10-CM | POA: Diagnosis present

## 2023-12-28 DIAGNOSIS — Z7901 Long term (current) use of anticoagulants: Secondary | ICD-10-CM | POA: Insufficient documentation

## 2023-12-28 DIAGNOSIS — R059 Cough, unspecified: Secondary | ICD-10-CM | POA: Diagnosis not present

## 2023-12-28 DIAGNOSIS — R531 Weakness: Secondary | ICD-10-CM | POA: Diagnosis not present

## 2023-12-28 DIAGNOSIS — M419 Scoliosis, unspecified: Secondary | ICD-10-CM | POA: Insufficient documentation

## 2023-12-28 LAB — URINALYSIS, ROUTINE W REFLEX MICROSCOPIC
Bilirubin Urine: NEGATIVE
Glucose, UA: NEGATIVE mg/dL
Hgb urine dipstick: NEGATIVE
Ketones, ur: NEGATIVE mg/dL
Leukocytes,Ua: NEGATIVE
Nitrite: NEGATIVE
Protein, ur: NEGATIVE mg/dL
Specific Gravity, Urine: 1.01 (ref 1.005–1.030)
pH: 6.5 (ref 5.0–8.0)

## 2023-12-28 LAB — COMPREHENSIVE METABOLIC PANEL WITH GFR
ALT: 18 U/L (ref 0–44)
AST: 24 U/L (ref 15–41)
Albumin: 3.8 g/dL (ref 3.5–5.0)
Alkaline Phosphatase: 86 U/L (ref 38–126)
Anion gap: 10 (ref 5–15)
BUN: 19 mg/dL (ref 8–23)
CO2: 25 mmol/L (ref 22–32)
Calcium: 9.2 mg/dL (ref 8.9–10.3)
Chloride: 105 mmol/L (ref 98–111)
Creatinine, Ser: 1.26 mg/dL — ABNORMAL HIGH (ref 0.44–1.00)
GFR, Estimated: 42 mL/min — ABNORMAL LOW (ref >60)
Glucose, Bld: 142 mg/dL — ABNORMAL HIGH (ref 70–99)
Potassium: 4.6 mmol/L (ref 3.5–5.1)
Sodium: 140 mmol/L (ref 135–145)
Total Bilirubin: 0.7 mg/dL (ref 0.0–1.2)
Total Protein: 6.8 g/dL (ref 6.5–8.1)

## 2023-12-28 LAB — CBC
HCT: 47.4 % — ABNORMAL HIGH (ref 36.0–46.0)
Hemoglobin: 14.9 g/dL (ref 12.0–15.0)
MCH: 28.3 pg (ref 26.0–34.0)
MCHC: 31.4 g/dL (ref 30.0–36.0)
MCV: 90.1 fL (ref 80.0–100.0)
Platelets: 181 10*3/uL (ref 150–400)
RBC: 5.26 MIL/uL — ABNORMAL HIGH (ref 3.87–5.11)
RDW: 16.7 % — ABNORMAL HIGH (ref 11.5–15.5)
WBC: 8.2 10*3/uL (ref 4.0–10.5)
nRBC: 0.4 % — ABNORMAL HIGH (ref 0.0–0.2)

## 2023-12-28 LAB — PROTIME-INR
INR: 2.9 — ABNORMAL HIGH (ref 0.8–1.2)
Prothrombin Time: 30.3 s — ABNORMAL HIGH (ref 11.4–15.2)

## 2023-12-28 LAB — CBG MONITORING, ED: Glucose-Capillary: 133 mg/dL — ABNORMAL HIGH (ref 70–99)

## 2023-12-28 LAB — TROPONIN T, HIGH SENSITIVITY: Troponin T High Sensitivity: <15 ng/L (ref <19)

## 2023-12-28 MED ORDER — SODIUM CHLORIDE 0.9 % IV BOLUS
1000.0000 mL | Freq: Once | INTRAVENOUS | Status: AC
  Administered 2023-12-28: 1000 mL via INTRAVENOUS

## 2023-12-28 NOTE — Discharge Instructions (Signed)
 Please let your family doctor and dermatologist know how you have been feeling.  Please return for worsening symptoms if you develop a fever if you feel you are unsafe at home.

## 2023-12-28 NOTE — ED Triage Notes (Signed)
 Pt POV in wheelchair with personal cane- c/o generalized weakness x1 week, productive cough x2 weeks.  Denies fever. Denies urinary sx.   Good po intake, denies known sick contacts.

## 2023-12-28 NOTE — ED Notes (Signed)
 Lab called to add troponin to previous collection.

## 2023-12-28 NOTE — Progress Notes (Signed)
 Asked to evaluate PT for SOB with exertion. PT got up to utilize bedside commode and staff noticed shortness of breath. RT confirmed PT did in fact feel a little SOB but she and daughter state that is normal for her. However, PT stated that she has been having trouble with SOB at night when lying flat in bed (has to sit up on side of bed in order to breath better). PT states she uses fluid pills as needed at home and did take 12/27/23 but not today. PT does have lower, bilateral leg edema (LT greater than RT). At this time PT BBS are clear and diminished in bases (no wheezing, no crackles), Sp02 on RA 100%, RR 27, BP 132/82, HR 73.

## 2023-12-28 NOTE — ED Provider Notes (Signed)
 [place] [facility] Provider Note   CSN: [id number] Arrival date & time: 12/28/23  1149     Patient presents with: Weakness   Jill Crane is a 83 y.o. female.   83 yo F with a chief complaint of fatigue.  Has been going on for couple weeks.  She has recently been restarted on methotrexate at an increased dose.  She has been coughing a little bit especially at night for the past couple weeks.  She has had some watery eyes as well.  No known sick contacts.  Difficulty breathing at times.  No chest pain or pressure.  No abdominal pain no nausea vomiting or diarrhea.  No dark stool or blood in her stool.  Patient's daughter is here and is very worried about her INR.   Weakness      Prior to Admission medications   Medication Sig Start Date End Date Taking? Authorizing Provider  acetaminophen  (TYLENOL ) 500 MG tablet Take 500 mg by mouth every 6 (six) hours as needed for mild pain.    [provider]  albuterol  (PROVENTIL ) (2.5 MG/3ML) 0.083% nebulizer solution Take 3 mLs (2.5 mg total) by nebulization every 6 (six) hours as needed for wheezing or shortness of breath. 10/02/23   Teddi Favors, DO  albuterol  (VENTOLIN  HFA) 108 (90 Base) MCG/ACT inhaler Inhale 2 puffs into the lungs every 6 (six) hours as needed for wheezing or shortness of breath. 02/02/22   Gonfa, Taye T, MD  albuterol  (VENTOLIN  HFA) 108 (90 Base) MCG/ACT inhaler Inhale 1-2 puffs into the lungs every 6 (six) hours as needed for wheezing or shortness of breath. 10/02/23   Teddi Favors, DO  alendronate (FOSAMAX) 35 MG tablet Take 35 mg by mouth every Monday. Take with a full glass of water  on an empty stomach.    [provider]  bimatoprost (LUMIGAN) 0.01 % SOLN Place 1 drop into both eyes at bedtime.    [provider]  brimonidine  (ALPHAGAN ) 0.2 % ophthalmic solution Place 1 drop into the left eye in the morning and at bedtime. 10/21/19   [provider]  busPIRone  (BUSPAR ) 10 MG tablet Take 10 mg by mouth 2 (two) times daily. 08/04/20   [provider]  calcium -vitamin D  (OSCAL WITH D) 500-200 MG-UNIT per tablet Take 1 tablet by mouth daily.    [provider]  cephALEXin  (KEFLEX ) 500 MG capsule Take 1 capsule (500 mg total) by mouth 3 (three) times daily. 08/30/23   Onetha Bile, MD  dorzolamide  (TRUSOPT ) 2 % ophthalmic solution Place 1 drop into the left eye 2 (two) times daily. 08/03/20   [provider]  fluocinonide  ointment (LIDEX ) 0.05 % Apply 1 Application topically 2 (two) times daily as needed (scalp irritation). 12/27/21   [provider]  fluticasone  (FLONASE ) 50 MCG/ACT nasal spray Place 2 sprays into both nostrils daily as needed for allergies. 09/27/21   [provider]  furosemide  (LASIX ) 20 MG tablet Take 1 tablet (20 mg total) by mouth daily. Patient taking differently: Take 20 mg by mouth daily as needed (swelling). 01/28/21   Krishnan, Sendil K, MD  gabapentin  (NEURONTIN ) 300 MG capsule Take 300 mg by mouth at bedtime. 08/10/20   [provider]  magnesium  oxide (MAG-OX) 400 (241.3 Mg) MG tablet Take 400 mg by mouth in the morning and at bedtime. 07/08/20   [provider]  metFORMIN  (GLUCOPHAGE ) 500 MG tablet Take 500 mg by mouth 2 (  two) times daily. 08/24/20   [provider]  metoprolol  succinate (TOPROL -XL) 25 MG 24 hr tablet Take 25 mg by mouth daily.    [provider]  omeprazole (PRILOSEC) 20 MG capsule Take 20 mg by mouth daily.    [provider]  rosuvastatin  (CRESTOR ) 10 MG tablet Take 1 tablet (10 mg total) by mouth daily. 07/18/22 07/18/23  Margherita Shell, MD  silver sulfADIAZINE (SILVADENE) 1 % cream Apply 1 Application topically daily as needed (scalp irritation).    [provider]  warfarin (COUMADIN ) 2 MG tablet On Saturday and Sunday, take 2 tabs (4mg ), then resume normal regimen Take 1 tablet (2 mg)  daily except Take 2 tablets (4 mg) on (Tues & Fri) Patient taking differently: Take 2-4 mg by mouth daily. Takes 2 mg (1 tab) daily except 3mg  Wednesdays 01/28/21   Krishnan, Sendil K, MD    Allergies: Patient has no known allergies.    Review of Systems  Neurological:  Positive for weakness.    Updated Vital Signs BP (!) 160/79   Pulse 75   Temp 97.9 F (36.6 C) (Oral)   Resp 15   Ht 5' 2 (1.575 m)   Wt 72.6 kg   SpO2 100%   BMI 29.26 kg/m   Physical Exam Vitals and nursing note reviewed.  Constitutional:      General: She is not in acute distress.    Appearance: She is well-developed. She is not diaphoretic.  HENT:     Head: Normocephalic and atraumatic.   Eyes:     Pupils: Pupils are equal, round, and reactive to light.    Cardiovascular:     Rate and Rhythm: Normal rate and regular rhythm.     Heart sounds: No murmur heard.    No friction rub. No gallop.  Pulmonary:     Effort: Pulmonary effort is normal.     Breath sounds: No wheezing or rales.  Abdominal:     General: There is no distension.     Palpations: Abdomen is soft.     Tenderness: There is no abdominal tenderness.   Musculoskeletal:        General: No tenderness.     Cervical back: Normal range of motion and neck supple.   Skin:    General: Skin is warm and dry.   Neurological:     Mental Status: She is alert and oriented to person, place, and time.     Comments: No appreciable lower extremity weakness.  She is able to lift her legs forcefully off the bed without issue.  Psychiatric:        Behavior: Behavior normal.     (all labs ordered are listed, but only abnormal results are displayed) Labs Reviewed  COMPREHENSIVE METABOLIC PANEL WITH GFR - Abnormal; Notable for the following components:      Result Value   Glucose, Bld 142 (*)    Creatinine, Ser 1.26 (*)    GFR, Estimated 42 (*)    All other components within normal limits  CBC - Abnormal; Notable for the following components:    RBC 5.26 (*)    HCT 47.4 (*)    RDW 16.7 (*)    nRBC 0.4 (*)    All other components within normal limits  URINALYSIS, ROUTINE W REFLEX MICROSCOPIC - Abnormal; Notable for the following components:   Color, Urine STRAW (*)    All other components within normal limits  PROTIME-INR - Abnormal; Notable for the following components:  Prothrombin Time 30.3 (*)    INR 2.9 (*)    All other components within normal limits  CBG MONITORING, ED - Abnormal; Notable for the following components:   Glucose-Capillary 133 (*)    All other components within normal limits  TROPONIN T, HIGH SENSITIVITY    EKG: EKG Interpretation Date/Time:  Friday December 28 2023 12:43:54 EDT Ventricular Rate:  72 PR Interval:    QRS Duration:  125 QT Interval:  420 QTC Calculation: 420 R Axis:   -66  Text Interpretation: Atrial fibrillation Left bundle branch block No significant change since last tracing Confirmed by Albertus Hughs [phone]) on 12/28/2023 12:47:21 PM  Radiology: [facility] Chest Port 1 View Result Date: 12/28/2023 CLINICAL DATA:  Shortness of breath EXAM: PORTABLE CHEST 1 VIEW COMPARISON:  10/02/2023 FINDINGS: Single lead pacer noted with tip projecting over the right ventricle. The patient is rotated to the right on today's radiograph, reducing diagnostic sensitivity and specificity. Atherosclerotic calcification of the aortic arch. Moderate enlargement of the cardiopericardial silhouette, without edema. No blunting of the costophrenic angles. The lungs appear clear. Bony demineralization noted along with kyphosis and levoconvex thoracic scoliosis. IMPRESSION: 1. Moderate enlargement of the cardiopericardial silhouette, without edema. 2. Single lead pacer. 3. Bony demineralization, kyphosis, and levoconvex thoracic scoliosis. Electronically Signed   By: Freida Jes M.D.   On: 12/28/2023 14:20     Procedures   Medications Ordered in the ED  sodium chloride  0.9 % bolus 1,000 mL (1,000 mLs Intravenous  New Bag/Given 12/28/23 1223)                                    Medical Decision Making Amount and/or Complexity of Data Reviewed Labs: ordered. Radiology: ordered.   83 yo F with a chief complaint of fatigue.  Going on for couple weeks.  Seems to coincide with restarting her methotrexate.  She has been coughing a little bit.  Will obtain a chest x-ray blood work.  Reassess.  Chest x-ray independently interpreted by me without focal features or pneumothorax.  UA negative for infection.  No significant electrolyte abnormalities.  No anemia.  INR 2.9.  I discussed results with the patient and family.  On repeat assessment she is feeling a little bit better after a bag of IV fluids.  Perhaps some component of this is due to her taking an increased dose of Lasix  at home.  Will discharge home.   2:43 PM:  I have discussed the diagnosis/risks/treatment options with the patient and family.  Evaluation and diagnostic testing in the emergency department does not suggest an emergent condition requiring admission or immediate intervention beyond what has been performed at this time.  They will follow up with PCP. We also discussed returning to the ED immediately if new or worsening sx occur. We discussed the sx which are most concerning (e.g., sudden worsening pain, fever, inability to tolerate by mouth) that necessitate immediate return. Medications administered to the patient during their visit and any new prescriptions provided to the patient are listed below.  Medications given during this visit Medications  sodium chloride  0.9 % bolus 1,000 mL (1,000 mLs Intravenous New Bag/Given 12/28/23 1223)     The patient appears reasonably screen and/or stabilized for discharge and I doubt any other medical condition or other [facility] requiring further screening, evaluation, or treatment in the ED at this time prior to discharge.      Final  diagnoses:  Fatigue, unspecified type    ED Discharge Orders      None          Albertus Hughs, DO 12/28/23 1443
